# Patient Record
Sex: Female | Born: 1940
Health system: Southern US, Community
[De-identification: ages and names within clinical notes are randomized; demographics above are authoritative.]

## PROBLEM LIST (undated history)

## (undated) DIAGNOSIS — I10 Essential (primary) hypertension: Secondary | ICD-10-CM

## (undated) DIAGNOSIS — E079 Disorder of thyroid, unspecified: Secondary | ICD-10-CM

## (undated) DIAGNOSIS — N3941 Urge incontinence: Secondary | ICD-10-CM

## (undated) DIAGNOSIS — E785 Hyperlipidemia, unspecified: Secondary | ICD-10-CM

## (undated) DIAGNOSIS — M1990 Unspecified osteoarthritis, unspecified site: Secondary | ICD-10-CM

## (undated) DIAGNOSIS — T7840XA Allergy, unspecified, initial encounter: Secondary | ICD-10-CM

## (undated) DIAGNOSIS — J302 Other seasonal allergic rhinitis: Secondary | ICD-10-CM

## (undated) DIAGNOSIS — K219 Gastro-esophageal reflux disease without esophagitis: Secondary | ICD-10-CM

## (undated) HISTORY — PX: FRACTURE SURGERY: SHX138

## (undated) HISTORY — PX: ABDOMINAL HYSTERECTOMY: SHX81

## (undated) HISTORY — DX: Unspecified osteoarthritis, unspecified site: M19.90

## (undated) HISTORY — DX: Hyperlipidemia, unspecified: E78.5

## (undated) HISTORY — PX: CYSTOCELE REPAIR: SHX163

## (undated) HISTORY — PX: ROTATOR CUFF REPAIR: SHX139

## (undated) HISTORY — DX: Allergy, unspecified, initial encounter: T78.40XA

## (undated) HISTORY — PX: APPENDECTOMY: SHX54

---

## 2000-01-14 ENCOUNTER — Other Ambulatory Visit: Admission: RE | Admit: 2000-01-14 | Discharge: 2000-01-14 | Payer: Self-pay | Admitting: Family Medicine

## 2000-08-17 ENCOUNTER — Encounter: Admission: RE | Admit: 2000-08-17 | Discharge: 2000-08-17 | Payer: Self-pay | Admitting: Family Medicine

## 2000-08-17 ENCOUNTER — Encounter: Payer: Self-pay | Admitting: Family Medicine

## 2000-08-28 ENCOUNTER — Ambulatory Visit (HOSPITAL_COMMUNITY): Admission: RE | Admit: 2000-08-28 | Discharge: 2000-08-28 | Payer: Self-pay | Admitting: *Deleted

## 2001-02-15 ENCOUNTER — Other Ambulatory Visit: Admission: RE | Admit: 2001-02-15 | Discharge: 2001-02-15 | Payer: Self-pay | Admitting: *Deleted

## 2002-04-17 ENCOUNTER — Other Ambulatory Visit: Admission: RE | Admit: 2002-04-17 | Discharge: 2002-04-17 | Payer: Self-pay | Admitting: Obstetrics and Gynecology

## 2002-12-07 ENCOUNTER — Encounter: Payer: Self-pay | Admitting: Emergency Medicine

## 2002-12-07 ENCOUNTER — Emergency Department (HOSPITAL_COMMUNITY): Admission: EM | Admit: 2002-12-07 | Discharge: 2002-12-07 | Payer: Self-pay | Admitting: Emergency Medicine

## 2003-04-30 ENCOUNTER — Other Ambulatory Visit: Admission: RE | Admit: 2003-04-30 | Discharge: 2003-04-30 | Payer: Self-pay | Admitting: Gastroenterology

## 2004-02-20 ENCOUNTER — Encounter: Admission: RE | Admit: 2004-02-20 | Discharge: 2004-02-20 | Payer: Self-pay | Admitting: Otolaryngology

## 2004-04-02 ENCOUNTER — Emergency Department (HOSPITAL_COMMUNITY): Admission: EM | Admit: 2004-04-02 | Discharge: 2004-04-02 | Payer: Self-pay | Admitting: Emergency Medicine

## 2004-04-29 ENCOUNTER — Other Ambulatory Visit: Admission: RE | Admit: 2004-04-29 | Discharge: 2004-04-29 | Payer: Self-pay | Admitting: Gastroenterology

## 2005-05-30 ENCOUNTER — Other Ambulatory Visit: Admission: RE | Admit: 2005-05-30 | Discharge: 2005-05-30 | Payer: Self-pay | Admitting: Family Medicine

## 2006-03-13 ENCOUNTER — Ambulatory Visit (HOSPITAL_COMMUNITY): Admission: RE | Admit: 2006-03-13 | Discharge: 2006-03-14 | Payer: Self-pay | Admitting: Obstetrics and Gynecology

## 2006-03-13 ENCOUNTER — Encounter (INDEPENDENT_AMBULATORY_CARE_PROVIDER_SITE_OTHER): Payer: Self-pay | Admitting: *Deleted

## 2008-02-11 ENCOUNTER — Emergency Department (HOSPITAL_BASED_OUTPATIENT_CLINIC_OR_DEPARTMENT_OTHER): Admission: EM | Admit: 2008-02-11 | Discharge: 2008-02-11 | Payer: Self-pay | Admitting: Emergency Medicine

## 2010-11-19 NOTE — Op Note (Signed)
Shelly Pearson, Shelly Pearson              ACCOUNT NO.:  [id number]   MEDICAL RECORD NO.:  [id number]          PATIENT TYPE:  AMB   LOCATION:  SDC                           FACILITY:  [facility]   PHYSICIAN:  Martina Sinner, MD DATE OF BIRTH:  [birth year]   DATE OF PROCEDURE:  03/13/2006  DATE OF DISCHARGE:                                 OPERATIVE REPORT   PREOP DIAGNOSIS:  Cystocele plus mild vaginal vault prolapse.   POSTOPERATIVE DIAGNOSIS:  Cystocele plus mild vaginal vault prolapse.   SURGERY:  Paravaginal cystocele repair utilizing dermal graft plus  cystoscopy (transvaginal hysterectomy performed by Edwena Felty. Romine, MD)   [name] has symptomatic mild grade 3 cystocele with some descensus of  the cervix and uterus.  She consented to transvaginal hysterectomy with  cystocele repair utilizing graft.   The patient was prepped and draped in the usual fashion.  Extra care was  taken to minimize the risk of compartment syndrome, neuropathy, and DVT.  She was given preoperative antibiotics.  Her blood work was normal  preoperatively. I was asked to come in and do the cystocele repair after the  transvaginal hysterectomy.  She underwent a McCall plication of the  ureterosacral ligaments resupporting the cuff.  This helped reduce some of  the cystocele.  She had a fairly short anterior vaginal wall.  The anterior  vaginal wall was opened with a T-shaped anterior incision in the midline.  I  used approximately 20 mL of lidocaine and epinephrine to aid in the  dissection plane and hemostasis.  I dissected the pubocervical fascia back  to the white line bilaterally.  She had a small central defect which was  closed with two running 2-0 Vicryl sutures with an SH needle.  I then  cystoscoped the patient; and she had efflux of indigo carmine from both  ureteral orifice orifices.  There were no distortion of the trigone.   With sharp and blunt dissection, I dissected back to the ischial  spine  bilaterally.  I was not able to fire the __________ Capio device in spite of  several attempts.  Prior to this, I was not that certain that it was  necessary to go back as far as the ischial spine.  After not be able to fire  the Capio device, effectively, I went ahead and did a 4-corners paravaginal  repair using #0 Vicryl suture placed in the pelvic sidewall.  Two sutures  were placed cephalad along the white line of __________, but not reaching  the ischial spine.  Adequate exposure was given with counter traction using  a narrow malleable retractor.  The other two sutures were placed near the  pubocervical ligament.  Good bites were obtained; and I was happy with the  strength of them.   A 10 x 6 cm graft was then sized to approximately 7 x 4 cm.  This was nicely  sutured in place with the help of a Mayo needle.  It was tension free.  One  2-0 Vicryl suture was used to sew the graft in the midline to the cuff.  Approximately 3 mm of vaginal wall was trimmed bilaterally.  I closed the  anterior vaginal wall with running 2-0 Vicryl.  Copious irrigation was  utilized.  Foley catheter was draining well at the of the case.  Leg  position was good.  Hemostasis was excellent.   Hopefully Ms. Resor will achieve an excellent long-term from her  cystocele repair.  She had a very small high rectocele posteriorly; and this  was not addressed at the time of surgery, based upon the preoperative and  intraoperative findings.           ______________________________  Martina Sinner, MD  Electronically Signed     SAM/MEDQ  D:  03/13/2006  T:  03/13/2006  Job:  [id number]

## 2010-11-19 NOTE — Op Note (Signed)
Shelly Pearson, Shelly Pearson              ACCOUNT NO.:  [id number]   MEDICAL RECORD NO.:  [id number]          PATIENT TYPE:  INP   LOCATION:  9320                          FACILITY:  [facility]   PHYSICIAN:  Cynthia P. Romine, M.D.DATE OF BIRTH:  Oct 13, 1940   DATE OF PROCEDURE:  03/13/2006  DATE OF DISCHARGE:                                 OPERATIVE REPORT   PREOPERATIVE DIAGNOSIS:  Uterine descensus with large cystocele.   POSTOPERATIVE DIAGNOSIS:  Uterine descensus with large cystocele, path  pending.   PROCEDURE:  Total vaginal hysterectomy by Dr. Arline Asp Romine, cystocele repair  by Martina Sinner, MD which will be dictated separately.   SURGEON:  Dr. Arline Asp Romine.   ASSISTANT:  Dr. Leda Quail.   ANESTHESIA:  General endotracheal.   ESTIMATED BLOOD LOSS:  50 mL.   COMPLICATIONS:  None.   PROCEDURE:  The patient was taken to the operating room and after induction  of adequate general endotracheal anesthesia was placed in dorsal lithotomy  position and prepped and draped in usual fashion.  The bladder was drained  with a red rubber catheter.  A posterior weighted and anterior Deaver  retractor were placed.  The cervix was grasped on its anterior lip with a  Jacobs tenaculum.  The mucosa over the cervix was infiltrated with  approximately 8 mL of 1% Xylocaine with 1:100,000 epinephrine.  The mucosa  was then incised with a knife from 9 o'clock to 3 o'clock and dissected  anteriorly to reveal the reflection of the peritoneum.  Attention was next  turned posteriorly.  A posterior colpotomy incision was made and the Bonanno  retractor was placed into the posterior cul-de-sac.  Uterosacral ligaments  were clamped, cut and tied with 0 Vicryl.  The cardinal ligaments on each  side were clamped and tied again with 0 Vicryl.  Attention was next turned  anteriorly anterior peritoneum was elevated.  The hemostat and entered  atraumatically and these were placed retractor was placed into  the anterior  peritoneal eight the uterine arteries were then clamped, cut and tied the  hysterectomy proceeded of the broad ligament clamping, cutting and tying in  sequence.  The fundus was delivered through the posterior cul-de-sac and the  pedicle containing the to the utero-ovarian ligament and the round ligament  was clamped, cut and doubly tied on each side.  The first tied with a free  tied the second tied with a fore-and-aft suture.  The pedicles were  inspected and felt to be hemostatic.  There was however, a small amount of  bleeding distal to the pedicle on the broad ligament that was grasped with  an Allis and figure-of-eight suture was used to achieve complete hemostasis.  The on the patient's left the ovaries were inspected and felt to be normal  the vaginal cuff was then run with a suture of 0 Vicryl running and locking,  incorporating the cuff and peritoneum and suture.  An antienterocele stitch  was then placed with 0 Vicryl by going into the posterior peritoneal space  through the cuff grasping the left uterosacral ligaments, skimming across  the  posterior peritoneum, grasping the right uterosacral ligament and coming out  the midline and tying. The wound was irrigated and felt to be hemostatic.  The vaginal cuff was closed with figure-of-eight sutures of 2-0 Vicryl  Rapide.  At this point Dr. Sherron Monday took over to do the cystocele repair  which is dictated in separate report.           ______________________________  Edwena Felty. Romine, M.D.     CPR/MEDQ  D:  03/13/2006  T:  03/14/2006  Job:  [id number]   cc:   Martina Sinner, MD  Fax: [phone]

## 2010-11-19 NOTE — Discharge Summary (Signed)
NAMEBRITTANYA, Shelly Pearson              ACCOUNT NO.:  [id number]   MEDICAL RECORD NO.:  [id number]          PATIENT TYPE:  OIB   LOCATION:  9399                          FACILITY:  [facility]   PHYSICIAN:  Cynthia P. Romine, M.D.DATE OF BIRTH:  Nov 12, 1940   DATE OF ADMISSION:  03/13/2006  DATE OF DISCHARGE:  03/14/2006                                 DISCHARGE SUMMARY   DISCHARGE DIAGNOSIS:  Uterine prolapse with cystocele.   PROCEDURES:  Total vaginal hysterectomy and repair of cystocele.   HISTORY:  This is a 70 year old married, white female, gravida 3, para 2,  who was experiencing vaginal bulging, discomfort with intercourse, and  uterine descensus.  She was admitted for a vaginal hysterectomy and a  cystocele repair.  On March 13, 2006, she underwent a vaginal  hysterectomy and a cystocele repair with repair vault prolapse with vaginal  hysterectomy that was done by Dr. Meredeth Ide.  The paravaginal repair  and the graft were done by Dr. Alfredo Martinez.  Estimated blood loss was  less than 200 mL and there were no complications.  On her first  postoperative day, she was afebrile in good condition.  She was tolerating a  regular diet.  She had voided on her own.  She had scant vaginal bleeding  and she felt she was in condition for a discharge.  She was sent home with  followup to see Dr. Sherron Monday in 2 weeks.  Followup to see me in 4 weeks.  She was given a prescription for Cipro 250 mg p.o. b.i.d. for 7 days and  Percocet 5 mg #12, one p.o. q. 4 h. p.r.n. pain.  She was given full  discharge instructions regarding pelvic  rest, no heavy lifting, and her  followup appointment.   Labs showed, on admission, her H and H were 14 and 41, on discharge 12 and  36.  Her blood type was AB negative.  PT and PTT were normal.  Pathology  report showed an active endometrium and chronic cervicitis.           ______________________________  Edwena Felty. Romine, M.D.     CPR/MEDQ  D:   05/04/2006  T:  05/04/2006  Job:  [id number]

## 2011-05-20 ENCOUNTER — Encounter: Payer: Self-pay | Admitting: *Deleted

## 2011-05-20 ENCOUNTER — Emergency Department (INDEPENDENT_AMBULATORY_CARE_PROVIDER_SITE_OTHER): Payer: Medicare Other

## 2011-05-20 ENCOUNTER — Emergency Department (HOSPITAL_BASED_OUTPATIENT_CLINIC_OR_DEPARTMENT_OTHER)
Admission: EM | Admit: 2011-05-20 | Discharge: 2011-05-20 | Disposition: A | Discharge status: Home / Self Care | Payer: Medicare Other | Attending: Emergency Medicine | Admitting: Emergency Medicine

## 2011-05-20 DIAGNOSIS — M2010 Hallux valgus (acquired), unspecified foot: Secondary | ICD-10-CM

## 2011-05-20 DIAGNOSIS — W19XXXA Unspecified fall, initial encounter: Secondary | ICD-10-CM

## 2011-05-20 DIAGNOSIS — S92109A Unspecified fracture of unspecified talus, initial encounter for closed fracture: Secondary | ICD-10-CM

## 2011-05-20 DIAGNOSIS — S92253A Displaced fracture of navicular [scaphoid] of unspecified foot, initial encounter for closed fracture: Secondary | ICD-10-CM | POA: Insufficient documentation

## 2011-05-20 DIAGNOSIS — X58XXXA Exposure to other specified factors, initial encounter: Secondary | ICD-10-CM | POA: Insufficient documentation

## 2011-05-20 DIAGNOSIS — S92153A Displaced avulsion fracture (chip fracture) of unspecified talus, initial encounter for closed fracture: Secondary | ICD-10-CM

## 2011-05-20 HISTORY — DX: Disorder of thyroid, unspecified: E07.9

## 2011-05-20 HISTORY — DX: Gastro-esophageal reflux disease without esophagitis: K21.9

## 2011-05-20 HISTORY — DX: Essential (primary) hypertension: I10

## 2011-05-20 HISTORY — DX: Other seasonal allergic rhinitis: J30.2

## 2011-05-20 MED ORDER — HYDROCODONE-ACETAMINOPHEN 5-325 MG PO TABS: ORAL_TABLET | ORAL | Status: DC

## 2011-05-20 NOTE — ED Notes (Signed)
Stepped off a step wrong and both feet rolled backward per pt. Swelling and pain to her right ankle and left great toe.

## 2011-05-20 NOTE — ED Provider Notes (Signed)
Medical screening examination/treatment/procedure(s) were performed by non-physician practitioner and as supervising physician I was immediately available for consultation/collaboration.   Dayton Bailiff, MD 05/20/11 [phone]

## 2011-05-20 NOTE — ED Provider Notes (Signed)
History     CSN: [id number] Arrival date & time: 05/20/2011  2:21 PM   First MD Initiated Contact with Patient 05/20/11 1424      Chief Complaint  Patient presents with  . Ankle Injury    (Consider location/radiation/quality/duration/timing/severity/associated sxs/prior treatment) Patient is a 70 y.o. female presenting with lower extremity injury. The history is provided by the patient.  Ankle Injury This is a new problem. The current episode started today (She missed the final stair causing injury to right ankle and left great toe.). The problem occurs constantly. Pertinent negatives include no chills, fever, headaches, nausea or numbness. Associated symptoms comments: No head injury, abdominal or chest pain. She reports injuries isolated to right ankle and left toe.. Exacerbated by: Movement.    Past Medical History  Diagnosis Date  . Hypertension   . Thyroid disease   . GERD (gastroesophageal reflux disease)   . Seasonal allergies     Past Surgical History  Procedure Date  . Cesarean section   . Abdominal hysterectomy     No family history on file.  History  Substance Use Topics  . Smoking status: Never Smoker   . Smokeless tobacco: Not on file  . Alcohol Use: No    OB History    Grav Para Term Preterm Abortions TAB SAB Ect Mult Living                  Review of Systems  Constitutional: Negative for fever and chills.  HENT: Negative.   Respiratory: Negative.   Cardiovascular: Negative.   Gastrointestinal: Negative.  Negative for nausea.  Musculoskeletal:       See HPI.  Skin: Negative.   Neurological: Negative.  Negative for numbness and headaches.    Allergies  Review of patient's allergies indicates no known allergies.  Home Medications   Current Outpatient Rx  Name Route Sig Dispense Refill  . ALLEGRA PO Oral Take by mouth.      Marland Kitchen LEVOTHROID PO Oral Take by mouth.      Marland Kitchen LOSARTAN POTASSIUM-HCTZ PO Oral Take by mouth.      Marland Kitchen RANITIDINE HCL  IJ Injection Inject as directed.      Marland Kitchen SANCTURA PO Oral Take by mouth.        BP 144/68  Pulse 85  Temp(Src) 98.2 F (36.8 C) (Oral)  Resp 22  SpO2 100%  Physical Exam  Constitutional: She is oriented to person, place, and time. She appears well-developed and well-nourished.  Neck: Normal range of motion.  Pulmonary/Chest: Effort normal.  Musculoskeletal:       Right ankle swollen laterally without bony deformity or discoloration. Distal neurosensory exam without deficit. DP pulse intact. Joint stable. Left great toe without swelling, deformity or discoloration. Pain with passive range of motion.  Neurological: She is alert and oriented to person, place, and time.  Skin: Skin is warm and dry.    ED Course  Procedures (including critical care time)  Labs Reviewed - No data to display No results found.   No diagnosis found.    MDM  The patient is put in a splint for the right ankle injury. She ambulates in the ED with crutches without difficulty.        Rodena Medin, PA 05/20/11 [phone]

## 2011-07-08 DIAGNOSIS — S96819A Strain of other specified muscles and tendons at ankle and foot level, unspecified foot, initial encounter: Secondary | ICD-10-CM | POA: Diagnosis not present

## 2011-07-08 DIAGNOSIS — S93499A Sprain of other ligament of unspecified ankle, initial encounter: Secondary | ICD-10-CM | POA: Diagnosis not present

## 2011-07-11 DIAGNOSIS — S93499A Sprain of other ligament of unspecified ankle, initial encounter: Secondary | ICD-10-CM | POA: Diagnosis not present

## 2011-07-11 DIAGNOSIS — S96819A Strain of other specified muscles and tendons at ankle and foot level, unspecified foot, initial encounter: Secondary | ICD-10-CM | POA: Diagnosis not present

## 2011-07-13 DIAGNOSIS — E7800 Pure hypercholesterolemia, unspecified: Secondary | ICD-10-CM | POA: Diagnosis not present

## 2011-07-13 DIAGNOSIS — K219 Gastro-esophageal reflux disease without esophagitis: Secondary | ICD-10-CM | POA: Diagnosis not present

## 2011-07-13 DIAGNOSIS — Z1331 Encounter for screening for depression: Secondary | ICD-10-CM | POA: Diagnosis not present

## 2011-07-13 DIAGNOSIS — E039 Hypothyroidism, unspecified: Secondary | ICD-10-CM | POA: Diagnosis not present

## 2011-07-13 DIAGNOSIS — I10 Essential (primary) hypertension: Secondary | ICD-10-CM | POA: Diagnosis not present

## 2011-07-13 DIAGNOSIS — N3941 Urge incontinence: Secondary | ICD-10-CM | POA: Diagnosis not present

## 2011-07-18 DIAGNOSIS — S93499A Sprain of other ligament of unspecified ankle, initial encounter: Secondary | ICD-10-CM | POA: Diagnosis not present

## 2011-07-18 DIAGNOSIS — S96819A Strain of other specified muscles and tendons at ankle and foot level, unspecified foot, initial encounter: Secondary | ICD-10-CM | POA: Diagnosis not present

## 2011-07-20 DIAGNOSIS — S93499A Sprain of other ligament of unspecified ankle, initial encounter: Secondary | ICD-10-CM | POA: Diagnosis not present

## 2011-07-20 DIAGNOSIS — S96819A Strain of other specified muscles and tendons at ankle and foot level, unspecified foot, initial encounter: Secondary | ICD-10-CM | POA: Diagnosis not present

## 2011-09-07 DIAGNOSIS — M79609 Pain in unspecified limb: Secondary | ICD-10-CM | POA: Diagnosis not present

## 2011-09-07 DIAGNOSIS — H9209 Otalgia, unspecified ear: Secondary | ICD-10-CM | POA: Diagnosis not present

## 2011-09-07 DIAGNOSIS — K219 Gastro-esophageal reflux disease without esophagitis: Secondary | ICD-10-CM | POA: Diagnosis not present

## 2011-09-14 DIAGNOSIS — M7660 Achilles tendinitis, unspecified leg: Secondary | ICD-10-CM | POA: Diagnosis not present

## 2011-09-14 DIAGNOSIS — M722 Plantar fascial fibromatosis: Secondary | ICD-10-CM | POA: Diagnosis not present

## 2011-09-19 DIAGNOSIS — M659 Synovitis and tenosynovitis, unspecified: Secondary | ICD-10-CM | POA: Diagnosis not present

## 2011-09-21 DIAGNOSIS — M659 Synovitis and tenosynovitis, unspecified: Secondary | ICD-10-CM | POA: Diagnosis not present

## 2011-10-10 DIAGNOSIS — M659 Synovitis and tenosynovitis, unspecified: Secondary | ICD-10-CM | POA: Diagnosis not present

## 2011-10-12 DIAGNOSIS — M659 Synovitis and tenosynovitis, unspecified: Secondary | ICD-10-CM | POA: Diagnosis not present

## 2011-10-17 DIAGNOSIS — M659 Synovitis and tenosynovitis, unspecified: Secondary | ICD-10-CM | POA: Diagnosis not present

## 2011-10-19 DIAGNOSIS — M659 Synovitis and tenosynovitis, unspecified: Secondary | ICD-10-CM | POA: Diagnosis not present

## 2011-10-24 DIAGNOSIS — M659 Synovitis and tenosynovitis, unspecified: Secondary | ICD-10-CM | POA: Diagnosis not present

## 2011-10-26 DIAGNOSIS — M659 Synovitis and tenosynovitis, unspecified: Secondary | ICD-10-CM | POA: Diagnosis not present

## 2011-10-31 DIAGNOSIS — M659 Synovitis and tenosynovitis, unspecified: Secondary | ICD-10-CM | POA: Diagnosis not present

## 2011-11-02 DIAGNOSIS — M659 Synovitis and tenosynovitis, unspecified: Secondary | ICD-10-CM | POA: Diagnosis not present

## 2011-11-07 DIAGNOSIS — M659 Synovitis and tenosynovitis, unspecified: Secondary | ICD-10-CM | POA: Diagnosis not present

## 2011-11-09 DIAGNOSIS — M722 Plantar fascial fibromatosis: Secondary | ICD-10-CM | POA: Diagnosis not present

## 2011-11-24 DIAGNOSIS — M722 Plantar fascial fibromatosis: Secondary | ICD-10-CM | POA: Diagnosis not present

## 2011-12-02 DIAGNOSIS — S9030XA Contusion of unspecified foot, initial encounter: Secondary | ICD-10-CM | POA: Diagnosis not present

## 2012-01-12 DIAGNOSIS — M949 Disorder of cartilage, unspecified: Secondary | ICD-10-CM | POA: Diagnosis not present

## 2012-01-12 DIAGNOSIS — Z1231 Encounter for screening mammogram for malignant neoplasm of breast: Secondary | ICD-10-CM | POA: Diagnosis not present

## 2012-01-12 DIAGNOSIS — M899 Disorder of bone, unspecified: Secondary | ICD-10-CM | POA: Diagnosis not present

## 2012-01-12 DIAGNOSIS — Z803 Family history of malignant neoplasm of breast: Secondary | ICD-10-CM | POA: Diagnosis not present

## 2012-02-17 DIAGNOSIS — H9209 Otalgia, unspecified ear: Secondary | ICD-10-CM | POA: Diagnosis not present

## 2012-02-17 DIAGNOSIS — H60399 Other infective otitis externa, unspecified ear: Secondary | ICD-10-CM | POA: Diagnosis not present

## 2012-04-26 DIAGNOSIS — H60399 Other infective otitis externa, unspecified ear: Secondary | ICD-10-CM | POA: Diagnosis not present

## 2012-05-03 DIAGNOSIS — Z23 Encounter for immunization: Secondary | ICD-10-CM | POA: Diagnosis not present

## 2012-05-10 DIAGNOSIS — B368 Other specified superficial mycoses: Secondary | ICD-10-CM | POA: Diagnosis not present

## 2012-05-10 DIAGNOSIS — H60399 Other infective otitis externa, unspecified ear: Secondary | ICD-10-CM | POA: Diagnosis not present

## 2012-05-30 DIAGNOSIS — H60509 Unspecified acute noninfective otitis externa, unspecified ear: Secondary | ICD-10-CM | POA: Diagnosis not present

## 2012-05-30 DIAGNOSIS — B368 Other specified superficial mycoses: Secondary | ICD-10-CM | POA: Diagnosis not present

## 2012-07-25 DIAGNOSIS — E7800 Pure hypercholesterolemia, unspecified: Secondary | ICD-10-CM | POA: Diagnosis not present

## 2012-07-25 DIAGNOSIS — Z0000 Encounter for general adult medical examination without abnormal findings: Secondary | ICD-10-CM | POA: Diagnosis not present

## 2012-07-25 DIAGNOSIS — Z131 Encounter for screening for diabetes mellitus: Secondary | ICD-10-CM | POA: Diagnosis not present

## 2012-07-25 DIAGNOSIS — I10 Essential (primary) hypertension: Secondary | ICD-10-CM | POA: Diagnosis not present

## 2012-08-01 DIAGNOSIS — K219 Gastro-esophageal reflux disease without esophagitis: Secondary | ICD-10-CM | POA: Diagnosis not present

## 2012-10-04 DIAGNOSIS — S40029A Contusion of unspecified upper arm, initial encounter: Secondary | ICD-10-CM | POA: Diagnosis not present

## 2012-10-04 DIAGNOSIS — E039 Hypothyroidism, unspecified: Secondary | ICD-10-CM | POA: Diagnosis not present

## 2012-11-06 ENCOUNTER — Emergency Department (HOSPITAL_BASED_OUTPATIENT_CLINIC_OR_DEPARTMENT_OTHER): Payer: Medicare Other

## 2012-11-06 ENCOUNTER — Emergency Department (HOSPITAL_BASED_OUTPATIENT_CLINIC_OR_DEPARTMENT_OTHER)
Admission: EM | Admit: 2012-11-06 | Discharge: 2012-11-06 | Disposition: A | Discharge status: Home / Self Care | Payer: Medicare Other | Attending: Emergency Medicine | Admitting: Emergency Medicine

## 2012-11-06 ENCOUNTER — Encounter (HOSPITAL_BASED_OUTPATIENT_CLINIC_OR_DEPARTMENT_OTHER): Payer: Self-pay

## 2012-11-06 DIAGNOSIS — K219 Gastro-esophageal reflux disease without esophagitis: Secondary | ICD-10-CM | POA: Insufficient documentation

## 2012-11-06 DIAGNOSIS — Y92009 Unspecified place in unspecified non-institutional (private) residence as the place of occurrence of the external cause: Secondary | ICD-10-CM | POA: Insufficient documentation

## 2012-11-06 DIAGNOSIS — Z79899 Other long term (current) drug therapy: Secondary | ICD-10-CM | POA: Diagnosis not present

## 2012-11-06 DIAGNOSIS — S0990XA Unspecified injury of head, initial encounter: Secondary | ICD-10-CM | POA: Diagnosis not present

## 2012-11-06 DIAGNOSIS — S1093XA Contusion of unspecified part of neck, initial encounter: Secondary | ICD-10-CM | POA: Diagnosis not present

## 2012-11-06 DIAGNOSIS — S0510XA Contusion of eyeball and orbital tissues, unspecified eye, initial encounter: Secondary | ICD-10-CM | POA: Diagnosis not present

## 2012-11-06 DIAGNOSIS — W010XXA Fall on same level from slipping, tripping and stumbling without subsequent striking against object, initial encounter: Secondary | ICD-10-CM | POA: Insufficient documentation

## 2012-11-06 DIAGNOSIS — W19XXXA Unspecified fall, initial encounter: Secondary | ICD-10-CM

## 2012-11-06 DIAGNOSIS — Z8709 Personal history of other diseases of the respiratory system: Secondary | ICD-10-CM | POA: Diagnosis not present

## 2012-11-06 DIAGNOSIS — W1809XA Striking against other object with subsequent fall, initial encounter: Secondary | ICD-10-CM | POA: Insufficient documentation

## 2012-11-06 DIAGNOSIS — I10 Essential (primary) hypertension: Secondary | ICD-10-CM | POA: Insufficient documentation

## 2012-11-06 DIAGNOSIS — S0511XA Contusion of eyeball and orbital tissues, right eye, initial encounter: Secondary | ICD-10-CM

## 2012-11-06 DIAGNOSIS — S0083XA Contusion of other part of head, initial encounter: Secondary | ICD-10-CM | POA: Diagnosis not present

## 2012-11-06 DIAGNOSIS — S199XXA Unspecified injury of neck, initial encounter: Secondary | ICD-10-CM | POA: Diagnosis not present

## 2012-11-06 DIAGNOSIS — E079 Disorder of thyroid, unspecified: Secondary | ICD-10-CM | POA: Insufficient documentation

## 2012-11-06 DIAGNOSIS — Y939 Activity, unspecified: Secondary | ICD-10-CM | POA: Insufficient documentation

## 2012-11-06 HISTORY — DX: Urge incontinence: N39.41

## 2012-11-06 MED ORDER — OXYCODONE-ACETAMINOPHEN 5-325 MG PO TABS: 1.0000 | ORAL_TABLET | ORAL | Status: DC | PRN

## 2012-11-06 MED ORDER — OXYCODONE-ACETAMINOPHEN 5-325 MG PO TABS
1.0000 | ORAL_TABLET | Freq: Once | ORAL | Status: AC
  Administered 2012-11-06: 1 via ORAL
  Filled 2012-11-06 (×2): qty 1

## 2012-11-06 NOTE — ED Notes (Signed)
Pt returned from CT °

## 2012-11-06 NOTE — ED Notes (Signed)
Patient transported to CT 

## 2012-11-06 NOTE — ED Provider Notes (Signed)
History     CSN: [id number]  Arrival date & time 11/06/12  0904   First MD Initiated Contact with Patient 11/06/12 [phone]      Chief Complaint  Patient presents with  . Head Injury    (Consider location/radiation/quality/duration/timing/severity/associated sxs/prior treatment) Patient is a 72 y.o. female presenting with head injury. The history is provided by the patient.  Head Injury She tripped and fell striking her right periorbital area on the floor. She denies loss of consciousness. She denies other injury. Pain is moderate and she rates it 5/10. She denies vision change, nausea, vomiting, dizziness. She is not taking aspirin or other antiplatelet agents and she's not on any anticoagulation.  Past Medical History  Diagnosis Date  . Hypertension   . Thyroid disease   . GERD (gastroesophageal reflux disease)   . Seasonal allergies   . Urgency incontinence     Past Surgical History  Procedure Laterality Date  . Cesarean section    . Abdominal hysterectomy    . Cystocele repair      No family history on file.  History  Substance Use Topics  . Smoking status: Never Smoker   . Smokeless tobacco: Not on file  . Alcohol Use: No    OB History   Grav Para Term Preterm Abortions TAB SAB Ect Mult Living                  Review of Systems  All other systems reviewed and are negative.    Allergies  Review of patient's allergies indicates no known allergies.  Home Medications   Current Outpatient Rx  Name  Route  Sig  Dispense  Refill  . Fexofenadine HCl (ALLEGRA PO)   Oral   Take by mouth.           . Levothyroxine Sodium (LEVOTHROID PO)   Oral   Take by mouth.           Marland Kitchen LOSARTAN POTASSIUM-HCTZ PO   Oral   Take by mouth.           Marland Kitchen RANITIDINE HCL IJ   Injection   Inject as directed.           . Trospium Chloride (SANCTURA PO)   Oral   Take by mouth.             BP 152/69  Pulse 78  Temp(Src) 98 F (36.7 C) (Oral)  Resp 18  Ht  5\' 4"  (1.626 m)  Wt 136 lb (61.689 kg)  BMI 23.33 kg/m2  SpO2 100%  Physical Exam  Nursing note and vitals reviewed.  72 year old female, resting comfortably and in no acute distress. Vital signs are significant for hypertension with blood pressure 152/69. Oxygen saturation is 100%, which is normal. Head is normocephalic. There is periorbital ecchymosis and some swelling around the right eye as well as mild swelling and tenderness in the right malar area. There is no palpable step off of the orbital rim. There is no limitation in extraocular movements. PERRLA, EOMI. Oropharynx is clear. Neck is nontenderwithout adenopathy or JVD. Back is nontender and there is no CVA tenderness. Lungs are clear without rales, wheezes, or rhonchi. Chest is nontender. Heart has regular rate and rhythm without murmur. Abdomen is soft, flat, nontender without masses or hepatosplenomegaly and peristalsis is normoactive. Extremities have no cyanosis or edema, full range of motion is present. Skin is warm and dry without rash. Neurologic: Mental status is normal, cranial nerves are intact,  there are no motor or sensory deficits.  ED Course  Procedures (including critical care time)  Labs Reviewed - No data to display Ct Head Wo Contrast  11/06/2012  *RADIOLOGY REPORT*  Clinical Data: Head injury post fall  CT HEAD WITHOUT CONTRAST,CT MAXILLOFACIAL WITHOUT CONTRAST,CT CERVICAL SPINE WITHOUT CONTRAST  Technique:  Contiguous axial images were obtained from the base of the skull through the vertex without contrast.,Technique: Multidetector CT imaging of the maxillofacial structures was performed. Multiplanar CT image reconstructions were also generated.  Comparison: None.  Findings: No skull fracture is noted.  Paranasal sinuses and mastoid air cells are unremarkable.  No intracranial hemorrhage, mass effect or midline shift.  Mild cerebral atrophy.  No acute infarction.  No mass lesion is noted on this unenhanced scan.   IMPRESSION: No acute intracranial abnormality.  Mild cerebral atrophy.  CT maxillofacial without IV contrast.  Axial images shows no acute fracture or subluxation.  There is soft tissue swelling and subcutaneous stranding right face.  No paranasal sinuses air fluid levels.  No nasal bone fracture.  No intra orbital hematoma.  Mild right preorbital soft tissue swelling.  No zygomatic fracture.  Coronal images shows no orbital rim or orbital floor fracture.  No TMJ dislocation.  No mandibular fracture is identified.  Mild left nasal septum deviation.  Impression: 1.  There is mild soft tissue swelling and subcutaneous stranding right face.  Mild right preorbital soft tissue swelling.  No intraorbital hematoma. 2.  No orbital rim or orbital floor fracture. 3.  No paranasal sinuses air fluid levels.  No mandibular fracture.  CT cervical spine without IV contrast:  Axial images of the cervical spine shows no acute fracture or subluxation.  Mild disc space flattening with mild anterior spurring at C4-C5 level.  Mild disc space flattening with anterior and posterior spurring at C5-C6 level.  No prevertebral soft tissue swelling.  Mild degenerative changes C1-C2 articulation.  Computer processed images shows no acute fracture or subluxation. Cervical airway is patent.  Impression: 1.  No acute fracture or subluxation.  Mild degenerative changes as described above.   Original Report Authenticated By: Natasha Mead, M.D.    Ct Cervical Spine Wo Contrast  11/06/2012  *RADIOLOGY REPORT*  Clinical Data: Head injury post fall  CT HEAD WITHOUT CONTRAST,CT MAXILLOFACIAL WITHOUT CONTRAST,CT CERVICAL SPINE WITHOUT CONTRAST  Technique:  Contiguous axial images were obtained from the base of the skull through the vertex without contrast.,Technique: Multidetector CT imaging of the maxillofacial structures was performed. Multiplanar CT image reconstructions were also generated.  Comparison: None.  Findings: No skull fracture is noted.   Paranasal sinuses and mastoid air cells are unremarkable.  No intracranial hemorrhage, mass effect or midline shift.  Mild cerebral atrophy.  No acute infarction.  No mass lesion is noted on this unenhanced scan.  IMPRESSION: No acute intracranial abnormality.  Mild cerebral atrophy.  CT maxillofacial without IV contrast.  Axial images shows no acute fracture or subluxation.  There is soft tissue swelling and subcutaneous stranding right face.  No paranasal sinuses air fluid levels.  No nasal bone fracture.  No intra orbital hematoma.  Mild right preorbital soft tissue swelling.  No zygomatic fracture.  Coronal images shows no orbital rim or orbital floor fracture.  No TMJ dislocation.  No mandibular fracture is identified.  Mild left nasal septum deviation.  Impression: 1.  There is mild soft tissue swelling and subcutaneous stranding right face.  Mild right preorbital soft tissue swelling.  No intraorbital hematoma. 2.  No orbital rim or orbital floor fracture. 3.  No paranasal sinuses air fluid levels.  No mandibular fracture.  CT cervical spine without IV contrast:  Axial images of the cervical spine shows no acute fracture or subluxation.  Mild disc space flattening with mild anterior spurring at C4-C5 level.  Mild disc space flattening with anterior and posterior spurring at C5-C6 level.  No prevertebral soft tissue swelling.  Mild degenerative changes C1-C2 articulation.  Computer processed images shows no acute fracture or subluxation. Cervical airway is patent.  Impression: 1.  No acute fracture or subluxation.  Mild degenerative changes as described above.   Original Report Authenticated By: Natasha Mead, M.D.    Ct Maxillofacial Wo Cm  11/06/2012  *RADIOLOGY REPORT*  Clinical Data: Head injury post fall  CT HEAD WITHOUT CONTRAST,CT MAXILLOFACIAL WITHOUT CONTRAST,CT CERVICAL SPINE WITHOUT CONTRAST  Technique:  Contiguous axial images were obtained from the base of the skull through the vertex without  contrast.,Technique: Multidetector CT imaging of the maxillofacial structures was performed. Multiplanar CT image reconstructions were also generated.  Comparison: None.  Findings: No skull fracture is noted.  Paranasal sinuses and mastoid air cells are unremarkable.  No intracranial hemorrhage, mass effect or midline shift.  Mild cerebral atrophy.  No acute infarction.  No mass lesion is noted on this unenhanced scan.  IMPRESSION: No acute intracranial abnormality.  Mild cerebral atrophy.  CT maxillofacial without IV contrast.  Axial images shows no acute fracture or subluxation.  There is soft tissue swelling and subcutaneous stranding right face.  No paranasal sinuses air fluid levels.  No nasal bone fracture.  No intra orbital hematoma.  Mild right preorbital soft tissue swelling.  No zygomatic fracture.  Coronal images shows no orbital rim or orbital floor fracture.  No TMJ dislocation.  No mandibular fracture is identified.  Mild left nasal septum deviation.  Impression: 1.  There is mild soft tissue swelling and subcutaneous stranding right face.  Mild right preorbital soft tissue swelling.  No intraorbital hematoma. 2.  No orbital rim or orbital floor fracture. 3.  No paranasal sinuses air fluid levels.  No mandibular fracture.  CT cervical spine without IV contrast:  Axial images of the cervical spine shows no acute fracture or subluxation.  Mild disc space flattening with mild anterior spurring at C4-C5 level.  Mild disc space flattening with anterior and posterior spurring at C5-C6 level.  No prevertebral soft tissue swelling.  Mild degenerative changes C1-C2 articulation.  Computer processed images shows no acute fracture or subluxation. Cervical airway is patent.  Impression: 1.  No acute fracture or subluxation.  Mild degenerative changes as described above.   Original Report Authenticated By: Natasha Mead, M.D.      1. Fall at home, initial encounter   2. Periorbital contusion, right, initial  encounter       MDM  Fall with head injury. She will be sent for CT to rule out fracture and intracranial injury.  CT scans are unremarkable. She states pain has increased and she is requesting something for pain. She's given a dose of Percocet in the ED and sent home with a prescription for Percocet.      Dione Booze, MD 11/06/12 1031

## 2012-11-06 NOTE — ED Notes (Signed)
Pt reports she fell this am striking head on hardwood floor.  Denies LOC. Bruising and pain noted to right eye.

## 2012-11-09 ENCOUNTER — Emergency Department (HOSPITAL_BASED_OUTPATIENT_CLINIC_OR_DEPARTMENT_OTHER): Payer: Medicare Other

## 2012-11-09 ENCOUNTER — Emergency Department (HOSPITAL_BASED_OUTPATIENT_CLINIC_OR_DEPARTMENT_OTHER)
Admission: EM | Admit: 2012-11-09 | Discharge: 2012-11-09 | Disposition: A | Discharge status: Home / Self Care | Payer: Medicare Other | Attending: Emergency Medicine | Admitting: Emergency Medicine

## 2012-11-09 ENCOUNTER — Encounter (HOSPITAL_BASED_OUTPATIENT_CLINIC_OR_DEPARTMENT_OTHER): Payer: Self-pay

## 2012-11-09 DIAGNOSIS — S82899A Other fracture of unspecified lower leg, initial encounter for closed fracture: Secondary | ICD-10-CM | POA: Insufficient documentation

## 2012-11-09 DIAGNOSIS — K219 Gastro-esophageal reflux disease without esophagitis: Secondary | ICD-10-CM | POA: Diagnosis not present

## 2012-11-09 DIAGNOSIS — S92309A Fracture of unspecified metatarsal bone(s), unspecified foot, initial encounter for closed fracture: Secondary | ICD-10-CM | POA: Diagnosis not present

## 2012-11-09 DIAGNOSIS — Z79899 Other long term (current) drug therapy: Secondary | ICD-10-CM | POA: Diagnosis not present

## 2012-11-09 DIAGNOSIS — S92301A Fracture of unspecified metatarsal bone(s), right foot, initial encounter for closed fracture: Secondary | ICD-10-CM

## 2012-11-09 DIAGNOSIS — S8263XA Displaced fracture of lateral malleolus of unspecified fibula, initial encounter for closed fracture: Secondary | ICD-10-CM | POA: Diagnosis not present

## 2012-11-09 DIAGNOSIS — Z87448 Personal history of other diseases of urinary system: Secondary | ICD-10-CM | POA: Insufficient documentation

## 2012-11-09 DIAGNOSIS — Y929 Unspecified place or not applicable: Secondary | ICD-10-CM | POA: Insufficient documentation

## 2012-11-09 DIAGNOSIS — E079 Disorder of thyroid, unspecified: Secondary | ICD-10-CM | POA: Diagnosis not present

## 2012-11-09 DIAGNOSIS — W1789XA Other fall from one level to another, initial encounter: Secondary | ICD-10-CM | POA: Insufficient documentation

## 2012-11-09 DIAGNOSIS — Y9301 Activity, walking, marching and hiking: Secondary | ICD-10-CM | POA: Insufficient documentation

## 2012-11-09 DIAGNOSIS — I10 Essential (primary) hypertension: Secondary | ICD-10-CM | POA: Insufficient documentation

## 2012-11-09 DIAGNOSIS — S82832A Other fracture of upper and lower end of left fibula, initial encounter for closed fracture: Secondary | ICD-10-CM

## 2012-11-09 MED ORDER — ONDANSETRON 4 MG PO TBDP: 4.0000 mg | ORAL_TABLET | Freq: Three times a day (TID) | ORAL | Status: DC | PRN

## 2012-11-09 MED ORDER — OXYCODONE-ACETAMINOPHEN 5-325 MG PO TABS: 2.0000 | ORAL_TABLET | ORAL | Status: DC | PRN

## 2012-11-09 MED ORDER — ONDANSETRON 4 MG PO TBDP
4.0000 mg | ORAL_TABLET | Freq: Once | ORAL | Status: AC
  Administered 2012-11-09: 4 mg via ORAL
  Filled 2012-11-09: qty 1

## 2012-11-09 MED ORDER — OXYCODONE-ACETAMINOPHEN 5-325 MG PO TABS
2.0000 | ORAL_TABLET | Freq: Once | ORAL | Status: AC
  Administered 2012-11-09: 2 via ORAL
  Filled 2012-11-09 (×2): qty 2

## 2012-11-09 NOTE — ED Provider Notes (Signed)
History     CSN: [id number]  Arrival date & time 11/09/12  1248   First MD Initiated Contact with Patient 11/09/12 1314      Chief Complaint  Patient presents with  . Ankle Pain    (Consider location/radiation/quality/duration/timing/severity/associated sxs/prior treatment) Patient is a 72 y.o. female presenting with fall. The history is provided by the patient. No language interpreter was used.  Fall The accident occurred less than 1 hour ago. The fall occurred while walking. She fell from a height of 1 to 2 ft. She landed on a hard floor. There was no blood loss. Point of impact: right ankle and left foot. The pain is at a severity of 5/10. The pain is moderate. She was not ambulatory at the scene. There was no drug use involved in the accident. She has tried nothing for the symptoms. The treatment provided no relief.  Pt tripped on a step,  Pt injured both ankles and foot.   Past Medical History  Diagnosis Date  . Hypertension   . Thyroid disease   . GERD (gastroesophageal reflux disease)   . Seasonal allergies   . Urgency incontinence     Past Surgical History  Procedure Laterality Date  . Cesarean section    . Abdominal hysterectomy    . Cystocele repair      No family history on file.  History  Substance Use Topics  . Smoking status: Never Smoker   . Smokeless tobacco: Not on file  . Alcohol Use: No    OB History   Grav Para Term Preterm Abortions TAB SAB Ect Mult Living                  Review of Systems  Musculoskeletal: Positive for joint swelling.  All other systems reviewed and are negative.    Allergies  Review of patient's allergies indicates no known allergies.  Home Medications   Current Outpatient Rx  Name  Route  Sig  Dispense  Refill  . Fexofenadine HCl (ALLEGRA PO)   Oral   Take by mouth.           . Levothyroxine Sodium (LEVOTHROID PO)   Oral   Take by mouth.           Marland Kitchen LOSARTAN POTASSIUM-HCTZ PO   Oral   Take by mouth.            . oxyCODONE-acetaminophen (ROXICET) 5-325 MG per tablet   Oral   Take 1 tablet by mouth every 4 (four) hours as needed for pain.   20 tablet   0   . RANITIDINE HCL IJ   Injection   Inject as directed.           . Trospium Chloride (SANCTURA PO)   Oral   Take by mouth.             BP 132/70  Pulse 83  Temp(Src) 98.9 F (37.2 C) (Oral)  Resp 16  Ht 5\' 4"  (1.626 m)  Wt 136 lb (61.689 kg)  BMI 23.33 kg/m2  SpO2 98%  Physical Exam  Nursing note and vitals reviewed. Constitutional: She is oriented to person, place, and time. She appears well-developed and well-nourished.  HENT:  Head: Normocephalic and atraumatic.  Eyes: Pupils are equal, round, and reactive to light.  Neck: Normal range of motion.  Musculoskeletal: She exhibits tenderness.  Neurological: She is alert and oriented to person, place, and time. She has normal reflexes.  Skin: Skin is warm.  Psychiatric: She has a normal mood and affect.    ED Course  Procedures (including critical care time)  Labs Reviewed - No data to display Dg Ankle Complete Left  11/09/2012  *RADIOLOGY REPORT*  Clinical Data: Fall today with ankle pain  LEFT ANKLE COMPLETE - 3+ VIEW  Comparison: None.  Findings: There is an oblique fracture through the distal fibula with only minimal displacement identified.  Mild soft tissue swelling is noted.  No other focal abnormality is seen.  IMPRESSION: Distal fibular fracture   Original Report Authenticated By: Alcide Clever, M.D.    Dg Ankle Complete Right  11/09/2012  *RADIOLOGY REPORT*  Clinical Data: Fall, ankle pain  RIGHT ANKLE - COMPLETE 3+ VIEW  Comparison: None.  Findings: Three views of the right ankle submitted.  Small avulsion fracture at the tip of distal fibula.  Soft tissue swelling adjacent to lateral malleolus.  Ankle mortise is preserved. There is nondisplaced fracture at the base of the fifth metatarsal.  IMPRESSION: Small avulsion fracture at the tip of distal  fibula.  Soft tissue swelling adjacent to lateral malleolus.  Ankle mortise is preserved. Nondisplaced fracture at the base of fifth metatarsal.   Original Report Authenticated By: Natasha Mead, M.D.      No diagnosis found.    MDM      Dr. Patria Mane in to see and examine.   Pt advised she needs a wheelchair.  Pt daughter works for a [employer].  Husband can help pt transfer.   Pt advised to see Dr. Pearletha Forge next week for evaluation  Pt given rx for percocet and zofran.   Pt placed in bilat cam walkers.       Lonia Skinner [place], PA-C 11/09/12 1452

## 2012-11-09 NOTE — ED Provider Notes (Signed)
Medical screening examination/treatment/procedure(s) were conducted as a shared visit with non-physician practitioner(s) and myself.  I personally evaluated the patient during the encounter  Orthopedic follow up. non weight bearing bilaterally. Home with wheelchair and bedside commode.   Lyanne Co, MD 11/09/12 [phone]

## 2012-11-09 NOTE — ED Notes (Signed)
MD at bedside. 

## 2012-11-09 NOTE — ED Notes (Signed)
Pt reports she fell after missing a step.  She has bilateral ankle pain and swelling.

## 2012-11-10 ENCOUNTER — Telehealth (HOSPITAL_BASED_OUTPATIENT_CLINIC_OR_DEPARTMENT_OTHER): Payer: Self-pay | Admitting: Physician Assistant

## 2012-11-10 NOTE — ED Notes (Signed)
Pt's daughter called stating that she was concerned that her mother was going to injure her father because she is not allowed to bear weight on either of her lower extremities.  The daughter stated that they had already used [facility] to obtain a wheelchair and bedside commode.  The pt's daughter states that she would like to have someone come out to their home and assess their needs and see what can be done.  Referral to [facility] completed by this RN with order from Langston Masker, PA-C.  Pt's daughter states that she is satisfied with this and will work with [facility] and orthopedist to meet mother's needs.

## 2012-11-11 DIAGNOSIS — I10 Essential (primary) hypertension: Secondary | ICD-10-CM | POA: Diagnosis not present

## 2012-11-11 DIAGNOSIS — IMO0001 Reserved for inherently not codable concepts without codable children: Secondary | ICD-10-CM | POA: Diagnosis not present

## 2012-11-11 DIAGNOSIS — Z9181 History of falling: Secondary | ICD-10-CM | POA: Diagnosis not present

## 2012-11-11 DIAGNOSIS — R269 Unspecified abnormalities of gait and mobility: Secondary | ICD-10-CM | POA: Diagnosis not present

## 2012-11-11 DIAGNOSIS — R32 Unspecified urinary incontinence: Secondary | ICD-10-CM | POA: Diagnosis not present

## 2012-11-12 ENCOUNTER — Ambulatory Visit (INDEPENDENT_AMBULATORY_CARE_PROVIDER_SITE_OTHER): Payer: Medicare Other | Admitting: Family Medicine

## 2012-11-12 ENCOUNTER — Encounter: Payer: Self-pay | Admitting: Family Medicine

## 2012-11-12 VITALS — BP 107/71 | HR 75 | Ht 64.0 in | Wt 135.0 lb

## 2012-11-12 DIAGNOSIS — R32 Unspecified urinary incontinence: Secondary | ICD-10-CM | POA: Diagnosis not present

## 2012-11-12 DIAGNOSIS — S92309A Fracture of unspecified metatarsal bone(s), unspecified foot, initial encounter for closed fracture: Secondary | ICD-10-CM

## 2012-11-12 DIAGNOSIS — Z9181 History of falling: Secondary | ICD-10-CM | POA: Diagnosis not present

## 2012-11-12 DIAGNOSIS — R269 Unspecified abnormalities of gait and mobility: Secondary | ICD-10-CM | POA: Diagnosis not present

## 2012-11-12 DIAGNOSIS — M25579 Pain in unspecified ankle and joints of unspecified foot: Secondary | ICD-10-CM | POA: Diagnosis not present

## 2012-11-12 DIAGNOSIS — S92354A Nondisplaced fracture of fifth metatarsal bone, right foot, initial encounter for closed fracture: Secondary | ICD-10-CM

## 2012-11-12 DIAGNOSIS — S99929A Unspecified injury of unspecified foot, initial encounter: Secondary | ICD-10-CM | POA: Diagnosis not present

## 2012-11-12 DIAGNOSIS — S82402A Unspecified fracture of shaft of left fibula, initial encounter for closed fracture: Secondary | ICD-10-CM

## 2012-11-12 DIAGNOSIS — S8990XA Unspecified injury of unspecified lower leg, initial encounter: Secondary | ICD-10-CM | POA: Diagnosis not present

## 2012-11-12 DIAGNOSIS — M25571 Pain in right ankle and joints of right foot: Secondary | ICD-10-CM

## 2012-11-12 DIAGNOSIS — S82409A Unspecified fracture of shaft of unspecified fibula, initial encounter for closed fracture: Secondary | ICD-10-CM | POA: Diagnosis not present

## 2012-11-12 DIAGNOSIS — I10 Essential (primary) hypertension: Secondary | ICD-10-CM | POA: Diagnosis not present

## 2012-11-12 DIAGNOSIS — IMO0001 Reserved for inherently not codable concepts without codable children: Secondary | ICD-10-CM | POA: Diagnosis not present

## 2012-11-12 DIAGNOSIS — S99911A Unspecified injury of right ankle, initial encounter: Secondary | ICD-10-CM

## 2012-11-12 MED ORDER — OXYCODONE-ACETAMINOPHEN 10-325 MG PO TABS: 1.0000 | ORAL_TABLET | Freq: Four times a day (QID) | ORAL | Status: DC | PRN

## 2012-11-12 NOTE — Patient Instructions (Addendum)
Your primary issues are a distal fibula fracture on the left, right foot avulsion fracture on the right. You should do well with conservative care for both though it's going to likely take longer than usual. Home health assessment is very important and should identify several things to make it easier to get around house - they can fax the report to me at [phone] for me to sign off on it.  If you need anything else (or they do), call me. Ice both ankles/right foot 15 minutes at a time 3-4 times a day. Prop them up above your heart as much as possible. Ok to bear weight as tolerated but must wear cam walkers. Ok to take the cam walkers off if you're lying down, bathing, icing. Take aleve 2 tabs twice a day with food (or ibuprofen 3 tabs three times a day with food) for pain and inflammation. Percocet as needed for severe pain. Follow up with me in 2 weeks.

## 2012-11-13 ENCOUNTER — Encounter: Payer: Self-pay | Admitting: Family Medicine

## 2012-11-13 DIAGNOSIS — S82402A Unspecified fracture of shaft of left fibula, initial encounter for closed fracture: Secondary | ICD-10-CM | POA: Insufficient documentation

## 2012-11-13 DIAGNOSIS — S92354A Nondisplaced fracture of fifth metatarsal bone, right foot, initial encounter for closed fracture: Secondary | ICD-10-CM | POA: Insufficient documentation

## 2012-11-13 DIAGNOSIS — S99911A Unspecified injury of right ankle, initial encounter: Secondary | ICD-10-CM | POA: Insufficient documentation

## 2012-11-13 NOTE — Assessment & Plan Note (Signed)
usually does well with conservative care - cam walker, icing, elevation, aleve, percocet.  Expect 6-8 weeks immobilization to improve.  F/u in 2 weeks.

## 2012-11-13 NOTE — Assessment & Plan Note (Signed)
of all her injuries this is the highest risk of nonunion.  She is non weight bearing currently as she can't put much weight on her feet except a tiny bit to help with transfers.  Cam walker, wheelchair.  Icing, elevation, aleve, percocet for pain.  Will monitor every 2 weeks for healing.  No evidence medial ankle disruption.  Has home health already set up.

## 2012-11-13 NOTE — Progress Notes (Signed)
Subjective:    Patient ID: Shelly Pearson, female    DOB: April 05, 1941, 72 y.o.   MRN: [id number]  PCP: Dr. Foy Guadalajara  HPI 72 yo F here for bilateral foot/ankle injuries.  Patient is here with husband. They report on 5/6 she suffered an initial injury where toes caught in ground in her crocs causing her to fall forward and land on her face. She had a large bruise to right eye - came to ED and had CTs of head, cervical spine, maxillofacial areas without acute injuries, fractures. Then on 5/9 she states she was going out to her flower garden - was up on 1 or 2 steps when she fell forward rolling over and inverting both feet/ankles. She had to crawl into house backwards to get help. Came to ED and had x-rays of both ankles and right foot. Shown to have a right distal fibula tip fracture, base 5th metatarsal avulsion fracture. Also has a left distal fibula spiral fracture that appears to be above the ankle joint. Placed in bilateral cam walkers and referred here for further treatment.  Past Medical History  Diagnosis Date  . Hypertension   . Thyroid disease   . GERD (gastroesophageal reflux disease)   . Seasonal allergies   . Urgency incontinence     Current Outpatient Prescriptions on File Prior to Visit  Medication Sig Dispense Refill  . Levothyroxine Sodium (LEVOTHROID PO) Take by mouth.        Marland Kitchen LOSARTAN POTASSIUM-HCTZ PO Take by mouth.        . Trospium Chloride (SANCTURA PO) Take by mouth.        . Fexofenadine HCl (ALLEGRA PO) Take by mouth.        . ondansetron (ZOFRAN ODT) 4 MG disintegrating tablet Take 1 tablet (4 mg total) by mouth every 8 (eight) hours as needed for nausea.  20 tablet  0  . RANITIDINE HCL IJ Inject as directed.         No current facility-administered medications on file prior to visit.    Past Surgical History  Procedure Laterality Date  . Cesarean section    . Abdominal hysterectomy    . Cystocele repair      No Known Allergies  History    Social History  . Marital Status: Married    Spouse Name: N/A    Number of Children: N/A  . Years of Education: N/A   Occupational History  . Not on file.   Social History Main Topics  . Smoking status: Never Smoker   . Smokeless tobacco: Not on file  . Alcohol Use: No  . Drug Use: No  . Sexually Active: Not on file   Other Topics Concern  . Not on file   Social History Narrative  . No narrative on file    Family History  Problem Relation Age of Onset  . Stroke Mother   . Cancer Father   . Cancer Sister   . Cancer Brother     BP 107/71  Pulse 75  Ht 5\' 4"  (1.626 m)  Wt 135 lb (61.236 kg)  BMI 23.16 kg/m2  Review of Systems See HPI above.    Objective:   Physical Exam Gen: NAD  R foot/ankle: Mod swelling mostly lateral foot, less lateral ankle.  No other deformity.  No warmth, skin breakdown. Mod limitation ROM all directions. TTP greatest base 5th metatarsal.  Mild tenderness lateral malleolus.  No medial ankle, navicular, other foot/ankle tenderness.  No fibular  head tenderness. Positive talar tilt - guarding, painful.  Negative ant drawer. Negative syndesmotic compression. Thompsons test negative. NV intact distally.  L foot/ankle: Mod swelling lateral ankle, bruising.  Mild foot swelling.  No warmth, skin breakdown, other deformities. Very limited ROM. TTP lateral malleolus.  No fibular head, medial malleolus, deltoid ligament, base 5th, navicular, other foot/ankle tenderness. Negative ant drawer.   Did not test syndesmotic compression. Thompsons test negative. NV intact distally.    Assessment & Plan:  1. Left distal fibular fracture - of all her injuries this is the highest risk of nonunion.  She is non weight bearing currently as she can't put much weight on her feet except a tiny bit to help with transfers.  Cam walker, wheelchair.  Icing, elevation, aleve, percocet for pain.  Will monitor every 2 weeks for healing.  No evidence medial ankle  disruption.  Has home health already set up.  2. Right fifth navicular avulsion fracture - usually does well with conservative care - cam walker, icing, elevation, aleve, percocet.  Expect 6-8 weeks immobilization to improve.  F/u in 2 weeks.  3. Right distal fibula avulsion fracture - not much pain here but may be masked by her other injuries.  Typically treat as you would a severe ankle sprain.  She will be more limited going forward by her other fractures.  Will monitor.

## 2012-11-13 NOTE — Assessment & Plan Note (Signed)
not much pain here but may be masked by her other injuries.  Typically treat as you would a severe ankle sprain.  She will be more limited going forward by her other fractures.  Will monitor.

## 2012-11-15 DIAGNOSIS — R32 Unspecified urinary incontinence: Secondary | ICD-10-CM | POA: Diagnosis not present

## 2012-11-15 DIAGNOSIS — IMO0001 Reserved for inherently not codable concepts without codable children: Secondary | ICD-10-CM | POA: Diagnosis not present

## 2012-11-15 DIAGNOSIS — Z9181 History of falling: Secondary | ICD-10-CM | POA: Diagnosis not present

## 2012-11-15 DIAGNOSIS — I10 Essential (primary) hypertension: Secondary | ICD-10-CM | POA: Diagnosis not present

## 2012-11-15 DIAGNOSIS — R269 Unspecified abnormalities of gait and mobility: Secondary | ICD-10-CM | POA: Diagnosis not present

## 2012-11-16 DIAGNOSIS — Z9181 History of falling: Secondary | ICD-10-CM | POA: Diagnosis not present

## 2012-11-16 DIAGNOSIS — R269 Unspecified abnormalities of gait and mobility: Secondary | ICD-10-CM | POA: Diagnosis not present

## 2012-11-16 DIAGNOSIS — R32 Unspecified urinary incontinence: Secondary | ICD-10-CM | POA: Diagnosis not present

## 2012-11-16 DIAGNOSIS — I10 Essential (primary) hypertension: Secondary | ICD-10-CM | POA: Diagnosis not present

## 2012-11-16 DIAGNOSIS — IMO0001 Reserved for inherently not codable concepts without codable children: Secondary | ICD-10-CM | POA: Diagnosis not present

## 2012-11-19 DIAGNOSIS — IMO0001 Reserved for inherently not codable concepts without codable children: Secondary | ICD-10-CM | POA: Diagnosis not present

## 2012-11-19 DIAGNOSIS — Z9181 History of falling: Secondary | ICD-10-CM | POA: Diagnosis not present

## 2012-11-19 DIAGNOSIS — R32 Unspecified urinary incontinence: Secondary | ICD-10-CM | POA: Diagnosis not present

## 2012-11-19 DIAGNOSIS — I10 Essential (primary) hypertension: Secondary | ICD-10-CM | POA: Diagnosis not present

## 2012-11-19 DIAGNOSIS — R269 Unspecified abnormalities of gait and mobility: Secondary | ICD-10-CM | POA: Diagnosis not present

## 2012-11-20 DIAGNOSIS — R32 Unspecified urinary incontinence: Secondary | ICD-10-CM | POA: Diagnosis not present

## 2012-11-20 DIAGNOSIS — I10 Essential (primary) hypertension: Secondary | ICD-10-CM | POA: Diagnosis not present

## 2012-11-20 DIAGNOSIS — IMO0001 Reserved for inherently not codable concepts without codable children: Secondary | ICD-10-CM | POA: Diagnosis not present

## 2012-11-20 DIAGNOSIS — R269 Unspecified abnormalities of gait and mobility: Secondary | ICD-10-CM | POA: Diagnosis not present

## 2012-11-20 DIAGNOSIS — Z9181 History of falling: Secondary | ICD-10-CM | POA: Diagnosis not present

## 2012-11-22 DIAGNOSIS — I10 Essential (primary) hypertension: Secondary | ICD-10-CM | POA: Diagnosis not present

## 2012-11-22 DIAGNOSIS — R269 Unspecified abnormalities of gait and mobility: Secondary | ICD-10-CM | POA: Diagnosis not present

## 2012-11-22 DIAGNOSIS — Z9181 History of falling: Secondary | ICD-10-CM | POA: Diagnosis not present

## 2012-11-22 DIAGNOSIS — IMO0001 Reserved for inherently not codable concepts without codable children: Secondary | ICD-10-CM | POA: Diagnosis not present

## 2012-11-22 DIAGNOSIS — R32 Unspecified urinary incontinence: Secondary | ICD-10-CM | POA: Diagnosis not present

## 2012-11-27 ENCOUNTER — Ambulatory Visit (HOSPITAL_BASED_OUTPATIENT_CLINIC_OR_DEPARTMENT_OTHER): Payer: Medicare Other | Admitting: Family Medicine

## 2012-11-27 ENCOUNTER — Ambulatory Visit (HOSPITAL_BASED_OUTPATIENT_CLINIC_OR_DEPARTMENT_OTHER)
Admission: RE | Admit: 2012-11-27 | Discharge: 2012-11-27 | Disposition: A | Discharge status: Home / Self Care | Payer: Medicare Other | Source: Ambulatory Visit | Attending: Family Medicine | Admitting: Family Medicine

## 2012-11-27 ENCOUNTER — Encounter: Payer: Self-pay | Admitting: Family Medicine

## 2012-11-27 VITALS — BP 112/72 | HR 85 | Ht 64.0 in | Wt 135.0 lb

## 2012-11-27 DIAGNOSIS — M25571 Pain in right ankle and joints of right foot: Secondary | ICD-10-CM

## 2012-11-27 DIAGNOSIS — M25579 Pain in unspecified ankle and joints of unspecified foot: Secondary | ICD-10-CM

## 2012-11-27 DIAGNOSIS — S82402D Unspecified fracture of shaft of left fibula, subsequent encounter for closed fracture with routine healing: Secondary | ICD-10-CM

## 2012-11-27 DIAGNOSIS — S8263XA Displaced fracture of lateral malleolus of unspecified fibula, initial encounter for closed fracture: Secondary | ICD-10-CM | POA: Insufficient documentation

## 2012-11-27 DIAGNOSIS — S8990XA Unspecified injury of unspecified lower leg, initial encounter: Secondary | ICD-10-CM | POA: Diagnosis not present

## 2012-11-27 DIAGNOSIS — S92309A Fracture of unspecified metatarsal bone(s), unspecified foot, initial encounter for closed fracture: Secondary | ICD-10-CM | POA: Diagnosis not present

## 2012-11-27 DIAGNOSIS — Z5189 Encounter for other specified aftercare: Secondary | ICD-10-CM | POA: Diagnosis not present

## 2012-11-27 DIAGNOSIS — S92354A Nondisplaced fracture of fifth metatarsal bone, right foot, initial encounter for closed fracture: Secondary | ICD-10-CM

## 2012-11-27 DIAGNOSIS — S99911D Unspecified injury of right ankle, subsequent encounter: Secondary | ICD-10-CM

## 2012-11-27 DIAGNOSIS — M25572 Pain in left ankle and joints of left foot: Secondary | ICD-10-CM

## 2012-11-27 DIAGNOSIS — S8290XD Unspecified fracture of unspecified lower leg, subsequent encounter for closed fracture with routine healing: Secondary | ICD-10-CM | POA: Diagnosis not present

## 2012-11-27 DIAGNOSIS — S82899A Other fracture of unspecified lower leg, initial encounter for closed fracture: Secondary | ICD-10-CM | POA: Diagnosis not present

## 2012-11-27 DIAGNOSIS — S82409A Unspecified fracture of shaft of unspecified fibula, initial encounter for closed fracture: Secondary | ICD-10-CM | POA: Diagnosis not present

## 2012-11-27 DIAGNOSIS — X58XXXA Exposure to other specified factors, initial encounter: Secondary | ICD-10-CM | POA: Insufficient documentation

## 2012-11-28 ENCOUNTER — Encounter: Payer: Self-pay | Admitting: Family Medicine

## 2012-11-28 NOTE — Assessment & Plan Note (Signed)
Right distal fibula avulsion fracture - More pain here than last visit.  Radiographs do not show a more extensive fracture.  May have some distal syndesmotic/high ankle sprain given swelling she has here now.  Continue with cam walker, icing, elevation, aleve, tylenol.

## 2012-11-28 NOTE — Progress Notes (Signed)
Subjective:    Patient ID: MARYCRUZ BOEHNER, female    DOB: [birth year], 72 y.o.   MRN: [id number]  PCP: Dr. Foy Guadalajara  HPI  72 yo F here for f/u bilateral foot/ankle injuries.  5/12: Patient is here with husband. They report on 5/6 she suffered an initial injury where toes caught in ground in her crocs causing her to fall forward and land on her face. She had a large bruise to right eye - came to ED and had CTs of head, cervical spine, maxillofacial areas without acute injuries, fractures. Then on 5/9 she states she was going out to her flower garden - was up on 1 or 2 steps when she fell forward rolling over and inverting both feet/ankles. She had to crawl into house backwards to get help. Came to ED and had x-rays of both ankles and right foot. Shown to have a right distal fibula tip fracture, base 5th metatarsal avulsion fracture. Also has a left distal fibula spiral fracture that appears to be above the ankle joint. Placed in bilateral cam walkers and referred here for further treatment.  5/27: Patient reports she feels quite a bit better than last visit. She has been icing, elevating both ankles, taking aleve. Has a hospital bed now so easy to prop legs up. Not getting up a lot - able to move some and do transfers easier now. Doing basic strengthening of quads at home. When off her feet she doesn't wear the cam walkers because they seem to rub on outside of her ankles and feel bulky. She has a walker, wheelchair, transfer board, shower chair. Put orthotics in her cam walkers which do help. Has percocet but doesn't like to take this.  Past Medical History  Diagnosis Date  . Hypertension   . Thyroid disease   . GERD (gastroesophageal reflux disease)   . Seasonal allergies   . Urgency incontinence     Current Outpatient Prescriptions on File Prior to Visit  Medication Sig Dispense Refill  . Fexofenadine HCl (ALLEGRA PO) Take by mouth.        . Levothyroxine Sodium  (LEVOTHROID PO) Take by mouth.        Marland Kitchen LOSARTAN POTASSIUM-HCTZ PO Take by mouth.        Marland Kitchen omeprazole (PRILOSEC) 10 MG capsule Take 10 mg by mouth daily.      . ondansetron (ZOFRAN ODT) 4 MG disintegrating tablet Take 1 tablet (4 mg total) by mouth every 8 (eight) hours as needed for nausea.  20 tablet  0  . oxyCODONE-acetaminophen (PERCOCET) 10-325 MG per tablet Take 1 tablet by mouth every 6 (six) hours as needed for pain.  60 tablet  0  . RANITIDINE HCL IJ Inject as directed.        . Trospium Chloride (SANCTURA PO) Take by mouth.         No current facility-administered medications on file prior to visit.    Past Surgical History  Procedure Laterality Date  . Cesarean section    . Abdominal hysterectomy    . Cystocele repair      No Known Allergies  History   Social History  . Marital Status: Married    Spouse Name: N/A    Number of Children: N/A  . Years of Education: N/A   Occupational History  . Not on file.   Social History Main Topics  . Smoking status: Never Smoker   . Smokeless tobacco: Not on file  . Alcohol Use: No  .  Drug Use: No  . Sexually Active: Not on file   Other Topics Concern  . Not on file   Social History Narrative  . No narrative on file    Family History  Problem Relation Age of Onset  . Stroke Mother   . Cancer Father   . Cancer Sister   . Cancer Brother     BP 112/72  Pulse 85  Ht 5\' 4"  (1.626 m)  Wt 135 lb (61.236 kg)  BMI 23.16 kg/m2  Review of Systems  See HPI above.    Objective:   Physical Exam  Gen: NAD  R foot/ankle: Mild swelling mostly lateral foot, less lateral ankle.  Mod swelling anterior ankle over syndesmosis distally.  No other deformity.  No warmth, skin breakdown. Mild limitation ROM all directions. TTP greatest base 5th metatarsal.  Mild tenderness lateral malleolus.  No medial ankle, navicular, other foot/ankle tenderness.  No fibular head tenderness. Deferred talar tilt, ant drawer. Negative  syndesmotic compression. Thompsons test negative. NV intact distally.  L foot/ankle: Mild swelling lateral ankle.  Bruising resolved.  Mild foot swelling.  No warmth, skin breakdown, other deformities. Did not test ROM left side. TTP lateral malleolus and lateral foot.  No fibular head, medial malleolus, deltoid ligament, base 5th, navicular, other foot/ankle tenderness. Did not test syndesmotic compression. Thompsons test negative. NV intact distally.    Assessment & Plan:  1. Left distal fibular fracture - of all her injuries this is the highest risk of nonunion.  Repeat radiographs show no displacement since last visit.  Occasionally puts some weight on here - discussed as little weight as possible but realistically may have to do some to help get into wheelchair - husband helping her a lot.  Would hold PT/OT for now until she's at least 6 weeks out then can consider adding this.  Cam walker, wheelchair, icing, elevation, aleve, tylenol for pain.  F/u in 4 weeks for reevaluation and repeat radiographs.    2. Right fifth metatarsal avulsion fracture - usually does well with conservative care - cam walker, icing, elevation, aleve, tylenol.  Expect 6-8 weeks immobilization to improve.  F/u in 4 weeks.  3. Right distal fibula avulsion fracture - More pain here than last visit.  Radiographs do not show a more extensive fracture.  May have some distal syndesmotic/high ankle sprain given swelling she has here now.  Continue with cam walker, icing, elevation, aleve, tylenol.

## 2012-11-28 NOTE — Assessment & Plan Note (Signed)
Left distal fibular fracture - of all her injuries this is the highest risk of nonunion.  Repeat radiographs show no displacement since last visit.  Occasionally puts some weight on here - discussed as little weight as possible but realistically may have to do some to help get into wheelchair - husband helping her a lot.  Would hold PT/OT for now until she's at least 6 weeks out then can consider adding this.  Cam walker, wheelchair, icing, elevation, aleve, tylenol for pain.  F/u in 4 weeks for reevaluation and repeat radiographs.

## 2012-11-28 NOTE — Assessment & Plan Note (Signed)
usually does well with conservative care - cam walker, icing, elevation, aleve, tylenol.  Expect 6-8 weeks immobilization to improve.  F/u in 4 weeks.

## 2012-11-28 NOTE — Patient Instructions (Addendum)
Follow-up in 4 weeks

## 2012-11-29 DIAGNOSIS — IMO0001 Reserved for inherently not codable concepts without codable children: Secondary | ICD-10-CM | POA: Diagnosis not present

## 2012-11-29 DIAGNOSIS — R269 Unspecified abnormalities of gait and mobility: Secondary | ICD-10-CM | POA: Diagnosis not present

## 2012-11-29 DIAGNOSIS — I10 Essential (primary) hypertension: Secondary | ICD-10-CM | POA: Diagnosis not present

## 2012-11-29 DIAGNOSIS — R32 Unspecified urinary incontinence: Secondary | ICD-10-CM | POA: Diagnosis not present

## 2012-11-29 DIAGNOSIS — Z9181 History of falling: Secondary | ICD-10-CM | POA: Diagnosis not present

## 2012-12-24 ENCOUNTER — Encounter: Payer: Self-pay | Admitting: Family Medicine

## 2012-12-24 ENCOUNTER — Ambulatory Visit (INDEPENDENT_AMBULATORY_CARE_PROVIDER_SITE_OTHER): Payer: Medicare Other | Admitting: Family Medicine

## 2012-12-24 ENCOUNTER — Ambulatory Visit (HOSPITAL_BASED_OUTPATIENT_CLINIC_OR_DEPARTMENT_OTHER)
Admission: RE | Admit: 2012-12-24 | Discharge: 2012-12-24 | Disposition: A | Discharge status: Home / Self Care | Payer: Medicare Other | Source: Ambulatory Visit | Attending: Family Medicine | Admitting: Family Medicine

## 2012-12-24 VITALS — BP 105/66 | HR 80 | Ht 64.0 in | Wt 135.0 lb

## 2012-12-24 DIAGNOSIS — Z5189 Encounter for other specified aftercare: Secondary | ICD-10-CM

## 2012-12-24 DIAGNOSIS — S8990XA Unspecified injury of unspecified lower leg, initial encounter: Secondary | ICD-10-CM | POA: Diagnosis not present

## 2012-12-24 DIAGNOSIS — S82402D Unspecified fracture of shaft of left fibula, subsequent encounter for closed fracture with routine healing: Secondary | ICD-10-CM

## 2012-12-24 DIAGNOSIS — S82409A Unspecified fracture of shaft of unspecified fibula, initial encounter for closed fracture: Secondary | ICD-10-CM | POA: Diagnosis not present

## 2012-12-24 DIAGNOSIS — S8290XD Unspecified fracture of unspecified lower leg, subsequent encounter for closed fracture with routine healing: Secondary | ICD-10-CM

## 2012-12-24 DIAGNOSIS — S99911D Unspecified injury of right ankle, subsequent encounter: Secondary | ICD-10-CM

## 2012-12-24 DIAGNOSIS — S92354A Nondisplaced fracture of fifth metatarsal bone, right foot, initial encounter for closed fracture: Secondary | ICD-10-CM

## 2012-12-24 DIAGNOSIS — M25579 Pain in unspecified ankle and joints of unspecified foot: Secondary | ICD-10-CM

## 2012-12-24 DIAGNOSIS — Z4789 Encounter for other orthopedic aftercare: Secondary | ICD-10-CM | POA: Diagnosis not present

## 2012-12-24 DIAGNOSIS — M25572 Pain in left ankle and joints of left foot: Secondary | ICD-10-CM

## 2012-12-24 DIAGNOSIS — S92309A Fracture of unspecified metatarsal bone(s), unspecified foot, initial encounter for closed fracture: Secondary | ICD-10-CM | POA: Diagnosis not present

## 2012-12-24 DIAGNOSIS — S8263XA Displaced fracture of lateral malleolus of unspecified fibula, initial encounter for closed fracture: Secondary | ICD-10-CM | POA: Diagnosis not present

## 2012-12-24 NOTE — Patient Instructions (Addendum)
Use walking boots when up and walking around for next 2 weeks - ok to put full weight on legs now. Then after this switch to supportive tennis/running shoes. Elevate, ice for 15 minutes at a time as needed for swelling and/or pain. Start physical therapy and do home exercises daily on days you don't go to therapy. Over next 3-4 weeks the only issue you should possibly have is swelling. Follow up with me in 4 weeks or as needed.

## 2012-12-24 NOTE — Assessment & Plan Note (Signed)
Right distal fibula avulsion fracture - Improved compared to last visit.  Start formal PT.  Cam walker as needed for next 2 weeks.

## 2012-12-24 NOTE — Assessment & Plan Note (Signed)
over 6 weeks out from her injuries.  Radiographs show excellent interval healing and her clinical healing also excellent - minimal tenderness on exam.  Start increasing weight bearing and transition out of cam walkers over next 2 weeks.  Start formal PT to regain motion and strength.  Has a walker she will use as well for next 2 weeks.  Icing, elevation, aleve, tylenol as needed.  F/u in 4 weeks or prn.

## 2012-12-24 NOTE — Assessment & Plan Note (Signed)
Right fifth metatarsal avulsion fracture - no pain now - clinically healed.

## 2012-12-24 NOTE — Progress Notes (Signed)
Subjective:    Patient ID: Shelly Pearson, female    DOB: Nov 26, 1940, 72 y.o.   MRN: [id number]  PCP: Dr. Foy Guadalajara  HPI  72 yo F here for f/u bilateral foot/ankle injuries.  5/12: Patient is here with husband. They report on 5/6 she suffered an initial injury where toes caught in ground in her crocs causing her to fall forward and land on her face. She had a large bruise to right eye - came to ED and had CTs of head, cervical spine, maxillofacial areas without acute injuries, fractures. Then on 5/9 she states she was going out to her flower garden - was up on 1 or 2 steps when she fell forward rolling over and inverting both feet/ankles. She had to crawl into house backwards to get help. Came to ED and had x-rays of both ankles and right foot. Shown to have a right distal fibula tip fracture, base 5th metatarsal avulsion fracture. Also has a left distal fibula spiral fracture that appears to be above the ankle joint. Placed in bilateral cam walkers and referred here for further treatment.  5/27: Patient reports she feels quite a bit better than last visit. She has been icing, elevating both ankles, taking aleve. Has a hospital bed now so easy to prop legs up. Not getting up a lot - able to move some and do transfers easier now. Doing basic strengthening of quads at home. When off her feet she doesn't wear the cam walkers because they seem to rub on outside of her ankles and feel bulky. She has a walker, wheelchair, transfer board, shower chair. Put orthotics in her cam walkers which do help. Has percocet but doesn't like to take this.  6/23: Patient reports she is much improved again since last visit. Using cam walkers and over past week has been putting more weight on legs - some soreness, swelling at end of day. Feels like especially on right side boot rubs outside of ankle. No pain when she's weight bearing. No longer with foot pain either. Not taking any medicines for  pain.  Past Medical History  Diagnosis Date  . Hypertension   . Thyroid disease   . GERD (gastroesophageal reflux disease)   . Seasonal allergies   . Urgency incontinence     Current Outpatient Prescriptions on File Prior to Visit  Medication Sig Dispense Refill  . Fexofenadine HCl (ALLEGRA PO) Take by mouth.        . Levothyroxine Sodium (LEVOTHROID PO) Take by mouth.        Marland Kitchen LOSARTAN POTASSIUM-HCTZ PO Take by mouth.        Marland Kitchen omeprazole (PRILOSEC) 10 MG capsule Take 10 mg by mouth daily.      . ondansetron (ZOFRAN ODT) 4 MG disintegrating tablet Take 1 tablet (4 mg total) by mouth every 8 (eight) hours as needed for nausea.  20 tablet  0  . oxyCODONE-acetaminophen (PERCOCET) 10-325 MG per tablet Take 1 tablet by mouth every 6 (six) hours as needed for pain.  60 tablet  0  . RANITIDINE HCL IJ Inject as directed.        . Trospium Chloride (SANCTURA PO) Take by mouth.        Marland Kitchen VAGIFEM 10 MCG TABS        No current facility-administered medications on file prior to visit.    Past Surgical History  Procedure Laterality Date  . Cesarean section    . Abdominal hysterectomy    . Cystocele  repair      No Known Allergies  History   Social History  . Marital Status: Married    Spouse Name: N/A    Number of Children: N/A  . Years of Education: N/A   Occupational History  . Not on file.   Social History Main Topics  . Smoking status: Never Smoker   . Smokeless tobacco: Not on file  . Alcohol Use: No  . Drug Use: No  . Sexually Active: Not on file   Other Topics Concern  . Not on file   Social History Narrative  . No narrative on file    Family History  Problem Relation Age of Onset  . Stroke Mother   . Cancer Father   . Cancer Sister   . Cancer Brother     BP 105/66  Pulse 80  Ht 5\' 4"  (1.626 m)  Wt 135 lb (61.236 kg)  BMI 23.16 kg/m2  Review of Systems  See HPI above.    Objective:   Physical Exam  Gen: NAD  R foot/ankle: Minimal swelling  anterior ankle.  No foot, other swelling, bruising, deformity. Mild limitation ROM all directions. No longer with 5th MT TTP, lat malleolar TTP.  No medial ankle, navicular, other foot/ankle tenderness.  No fibular head tenderness. Negative talar tilt, ant drawer. Mild + syndesmotic compression. Thompsons test negative. NV intact distally.  L foot/ankle: Mild swelling lateral ankle.  No bruising, foot swelling.  No warmth, skin breakdown, other deformities. Mild limitation ROM all directions. Minimal TTP lateral malleolus.  No fibular head, medial malleolus, deltoid ligament, base 5th, navicular, other foot/ankle tenderness. Did not test syndesmotic compression. Thompsons test negative. NV intact distally.    Assessment & Plan:  1. Left distal fibular fracture - over 6 weeks out from her injuries.  Radiographs show excellent interval healing and her clinical healing also excellent - minimal tenderness on exam.  Start increasing weight bearing and transition out of cam walkers over next 2 weeks.  Start formal PT to regain motion and strength.  Has a walker she will use as well for next 2 weeks.  Icing, elevation, aleve, tylenol as needed.  F/u in 4 weeks or prn.  2. Right fifth metatarsal avulsion fracture - no pain now - clinically healed.    3. Right distal fibula avulsion fracture - Improved compared to last visit.  Start formal PT.  Cam walker as needed for next 2 weeks.

## 2013-01-09 ENCOUNTER — Ambulatory Visit: Payer: Medicare Other | Attending: Family Medicine | Admitting: Physical Therapy

## 2013-01-09 DIAGNOSIS — M25579 Pain in unspecified ankle and joints of unspecified foot: Secondary | ICD-10-CM | POA: Insufficient documentation

## 2013-01-09 DIAGNOSIS — M2560 Stiffness of unspecified joint, not elsewhere classified: Secondary | ICD-10-CM | POA: Diagnosis not present

## 2013-01-09 DIAGNOSIS — R5381 Other malaise: Secondary | ICD-10-CM | POA: Insufficient documentation

## 2013-01-09 DIAGNOSIS — R269 Unspecified abnormalities of gait and mobility: Secondary | ICD-10-CM | POA: Diagnosis not present

## 2013-01-09 DIAGNOSIS — IMO0001 Reserved for inherently not codable concepts without codable children: Secondary | ICD-10-CM | POA: Insufficient documentation

## 2013-01-15 ENCOUNTER — Ambulatory Visit: Payer: Medicare Other | Admitting: Physical Therapy

## 2013-01-18 ENCOUNTER — Ambulatory Visit: Payer: Medicare Other | Admitting: Physical Therapy

## 2013-01-22 ENCOUNTER — Ambulatory Visit: Payer: Medicare Other | Admitting: Physical Therapy

## 2013-01-25 ENCOUNTER — Ambulatory Visit: Payer: Medicare Other | Admitting: Physical Therapy

## 2013-01-29 ENCOUNTER — Ambulatory Visit: Payer: Medicare Other | Admitting: Physical Therapy

## 2013-01-31 ENCOUNTER — Ambulatory Visit: Payer: Medicare Other | Admitting: Physical Therapy

## 2013-02-01 ENCOUNTER — Encounter: Payer: Medicare Other | Admitting: Physical Therapy

## 2013-02-05 ENCOUNTER — Ambulatory Visit: Payer: Medicare Other | Attending: Family Medicine | Admitting: Physical Therapy

## 2013-02-05 DIAGNOSIS — R269 Unspecified abnormalities of gait and mobility: Secondary | ICD-10-CM | POA: Insufficient documentation

## 2013-02-05 DIAGNOSIS — M25579 Pain in unspecified ankle and joints of unspecified foot: Secondary | ICD-10-CM | POA: Insufficient documentation

## 2013-02-05 DIAGNOSIS — M2560 Stiffness of unspecified joint, not elsewhere classified: Secondary | ICD-10-CM | POA: Diagnosis not present

## 2013-02-05 DIAGNOSIS — R5381 Other malaise: Secondary | ICD-10-CM | POA: Diagnosis not present

## 2013-02-05 DIAGNOSIS — IMO0001 Reserved for inherently not codable concepts without codable children: Secondary | ICD-10-CM | POA: Insufficient documentation

## 2013-02-08 ENCOUNTER — Ambulatory Visit: Payer: Medicare Other | Admitting: Physical Therapy

## 2013-02-08 DIAGNOSIS — M25579 Pain in unspecified ankle and joints of unspecified foot: Secondary | ICD-10-CM | POA: Diagnosis not present

## 2013-02-08 DIAGNOSIS — R269 Unspecified abnormalities of gait and mobility: Secondary | ICD-10-CM | POA: Diagnosis not present

## 2013-02-08 DIAGNOSIS — R5381 Other malaise: Secondary | ICD-10-CM | POA: Diagnosis not present

## 2013-02-08 DIAGNOSIS — M2560 Stiffness of unspecified joint, not elsewhere classified: Secondary | ICD-10-CM | POA: Diagnosis not present

## 2013-02-08 DIAGNOSIS — IMO0001 Reserved for inherently not codable concepts without codable children: Secondary | ICD-10-CM | POA: Diagnosis not present

## 2013-02-11 ENCOUNTER — Ambulatory Visit: Payer: Medicare Other | Admitting: Physical Therapy

## 2013-02-11 DIAGNOSIS — IMO0001 Reserved for inherently not codable concepts without codable children: Secondary | ICD-10-CM | POA: Diagnosis not present

## 2013-02-11 DIAGNOSIS — M2560 Stiffness of unspecified joint, not elsewhere classified: Secondary | ICD-10-CM | POA: Diagnosis not present

## 2013-02-11 DIAGNOSIS — R269 Unspecified abnormalities of gait and mobility: Secondary | ICD-10-CM | POA: Diagnosis not present

## 2013-02-11 DIAGNOSIS — M25579 Pain in unspecified ankle and joints of unspecified foot: Secondary | ICD-10-CM | POA: Diagnosis not present

## 2013-02-11 DIAGNOSIS — R5381 Other malaise: Secondary | ICD-10-CM | POA: Diagnosis not present

## 2013-02-13 DIAGNOSIS — Z1231 Encounter for screening mammogram for malignant neoplasm of breast: Secondary | ICD-10-CM | POA: Diagnosis not present

## 2013-02-14 ENCOUNTER — Ambulatory Visit: Payer: Medicare Other | Admitting: Physical Therapy

## 2013-02-14 DIAGNOSIS — R269 Unspecified abnormalities of gait and mobility: Secondary | ICD-10-CM | POA: Diagnosis not present

## 2013-02-14 DIAGNOSIS — R5381 Other malaise: Secondary | ICD-10-CM | POA: Diagnosis not present

## 2013-02-14 DIAGNOSIS — IMO0001 Reserved for inherently not codable concepts without codable children: Secondary | ICD-10-CM | POA: Diagnosis not present

## 2013-02-14 DIAGNOSIS — M2560 Stiffness of unspecified joint, not elsewhere classified: Secondary | ICD-10-CM | POA: Diagnosis not present

## 2013-02-14 DIAGNOSIS — M25579 Pain in unspecified ankle and joints of unspecified foot: Secondary | ICD-10-CM | POA: Diagnosis not present

## 2013-06-11 DIAGNOSIS — M899 Disorder of bone, unspecified: Secondary | ICD-10-CM | POA: Diagnosis not present

## 2013-06-11 DIAGNOSIS — R5381 Other malaise: Secondary | ICD-10-CM | POA: Diagnosis not present

## 2013-06-11 DIAGNOSIS — E039 Hypothyroidism, unspecified: Secondary | ICD-10-CM | POA: Diagnosis not present

## 2013-06-11 DIAGNOSIS — R51 Headache: Secondary | ICD-10-CM | POA: Diagnosis not present

## 2013-06-11 DIAGNOSIS — D509 Iron deficiency anemia, unspecified: Secondary | ICD-10-CM | POA: Diagnosis not present

## 2013-06-11 DIAGNOSIS — G4700 Insomnia, unspecified: Secondary | ICD-10-CM | POA: Diagnosis not present

## 2013-06-11 DIAGNOSIS — R0609 Other forms of dyspnea: Secondary | ICD-10-CM | POA: Diagnosis not present

## 2013-06-24 DIAGNOSIS — Z1211 Encounter for screening for malignant neoplasm of colon: Secondary | ICD-10-CM | POA: Diagnosis not present

## 2013-08-15 DIAGNOSIS — I10 Essential (primary) hypertension: Secondary | ICD-10-CM | POA: Diagnosis not present

## 2013-08-15 DIAGNOSIS — Z0000 Encounter for general adult medical examination without abnormal findings: Secondary | ICD-10-CM | POA: Diagnosis not present

## 2013-08-15 DIAGNOSIS — E039 Hypothyroidism, unspecified: Secondary | ICD-10-CM | POA: Diagnosis not present

## 2013-08-15 DIAGNOSIS — Z23 Encounter for immunization: Secondary | ICD-10-CM | POA: Diagnosis not present

## 2013-08-15 DIAGNOSIS — D509 Iron deficiency anemia, unspecified: Secondary | ICD-10-CM | POA: Diagnosis not present

## 2013-08-15 DIAGNOSIS — Z131 Encounter for screening for diabetes mellitus: Secondary | ICD-10-CM | POA: Diagnosis not present

## 2013-08-15 DIAGNOSIS — E7800 Pure hypercholesterolemia, unspecified: Secondary | ICD-10-CM | POA: Diagnosis not present

## 2013-09-05 DIAGNOSIS — E7800 Pure hypercholesterolemia, unspecified: Secondary | ICD-10-CM | POA: Diagnosis not present

## 2013-09-05 DIAGNOSIS — D509 Iron deficiency anemia, unspecified: Secondary | ICD-10-CM | POA: Diagnosis not present

## 2013-09-05 DIAGNOSIS — I10 Essential (primary) hypertension: Secondary | ICD-10-CM | POA: Diagnosis not present

## 2013-09-24 ENCOUNTER — Encounter (INDEPENDENT_AMBULATORY_CARE_PROVIDER_SITE_OTHER): Payer: Self-pay

## 2013-09-24 ENCOUNTER — Encounter: Payer: Self-pay | Admitting: Family Medicine

## 2013-09-24 ENCOUNTER — Ambulatory Visit (INDEPENDENT_AMBULATORY_CARE_PROVIDER_SITE_OTHER): Payer: Medicare Other | Admitting: Family Medicine

## 2013-09-24 VITALS — BP 117/75 | HR 84 | Ht 64.0 in | Wt 135.0 lb

## 2013-09-24 DIAGNOSIS — M25579 Pain in unspecified ankle and joints of unspecified foot: Secondary | ICD-10-CM | POA: Diagnosis not present

## 2013-09-24 DIAGNOSIS — M25572 Pain in left ankle and joints of left foot: Secondary | ICD-10-CM

## 2013-09-24 NOTE — Patient Instructions (Signed)
You have posterior tibialis tendinopathy. Continue with good arch supports regularly - no barefoot walking. Do home exercises 3 sets of 10 once a day (internal rotation of ankle with theraband, single leg calf raise). Icing 15 minutes at a time 3-4 times a day. Try topical flexall, aspercreme, or capsaicin four times a day. Consider physical therapy if still struggling after 5-6 weeks. Otherwise follow-up with me as needed.

## 2013-09-26 ENCOUNTER — Encounter: Payer: Self-pay | Admitting: Family Medicine

## 2013-09-26 DIAGNOSIS — M25572 Pain in left ankle and joints of left foot: Secondary | ICD-10-CM | POA: Insufficient documentation

## 2013-09-26 NOTE — Progress Notes (Signed)
Patient ID: Shelly Pearson, female   DOB: February 16, 1941, 74 y.o.   MRN: [id number]  PCP: Abigail Miyamoto, MD  Subjective:   HPI: Patient is a 73 y.o. female here for left foot/ankle pain.  Patient reports she completely recovered from prior fractures (see last note). Over past 4 weeks she's had worsening medial left ankle pain. Is doing some home exercises for feet. Pain radiates up medial calf. No swelling. Worse by end of day. Has not tried any medications for this.  Past Medical History  Diagnosis Date  . Hypertension   . Thyroid disease   . GERD (gastroesophageal reflux disease)   . Seasonal allergies   . Urgency incontinence     Current Outpatient Prescriptions on File Prior to Visit  Medication Sig Dispense Refill  . Fexofenadine HCl (ALLEGRA PO) Take by mouth.        . Levothyroxine Sodium (LEVOTHROID PO) Take by mouth.        Marland Kitchen LOSARTAN POTASSIUM-HCTZ PO Take by mouth.        Marland Kitchen omeprazole (PRILOSEC) 10 MG capsule Take 10 mg by mouth daily.      . ondansetron (ZOFRAN ODT) 4 MG disintegrating tablet Take 1 tablet (4 mg total) by mouth every 8 (eight) hours as needed for nausea.  20 tablet  0  . oxyCODONE-acetaminophen (PERCOCET) 10-325 MG per tablet Take 1 tablet by mouth every 6 (six) hours as needed for pain.  60 tablet  0  . RANITIDINE HCL IJ Inject as directed.        . Trospium Chloride (SANCTURA PO) Take by mouth.        Marland Kitchen VAGIFEM 10 MCG TABS        No current facility-administered medications on file prior to visit.    Past Surgical History  Procedure Laterality Date  . Cesarean section    . Abdominal hysterectomy    . Cystocele repair      No Known Allergies  History   Social History  . Marital Status: Married    Spouse Name: N/A    Number of Children: N/A  . Years of Education: N/A   Occupational History  . Not on file.   Social History Main Topics  . Smoking status: Never Smoker   . Smokeless tobacco: Not on file  . Alcohol Use: No  .  Drug Use: No  . Sexual Activity: Not on file   Other Topics Concern  . Not on file   Social History Narrative  . No narrative on file    Family History  Problem Relation Age of Onset  . Stroke Mother   . Cancer Father   . Cancer Sister   . Cancer Brother     BP 117/75  Pulse 84  Ht 5\' 4"  (1.626 m)  Wt 135 lb (61.236 kg)  BMI 23.16 kg/m2  Review of Systems: See HPI above.    Objective:  Physical Exam:  Gen: NAD  Left foot/ankle: Mild medial ankle swelling.  Overpronation, pes planus.  No other deformity, bruising. FROM with pain on IR and ER. TTP along course of post tib as it crosses ankle joint medially. Negative ant drawer and talar tilt.   Negative syndesmotic compression. Thompsons test negative. NV intact distally.    Assessment & Plan:  1. Left ankle pain - 2/2 posterior tibialis tendinopathy.  Continue with good arch supports and no barefoot walking.  HEP reviewed today.  Icing, topical medications.  Consider formal physical therapy if  not improving over next 5-6 weeks.

## 2013-09-26 NOTE — Assessment & Plan Note (Signed)
2/2 posterior tibialis tendinopathy.  Continue with good arch supports and no barefoot walking.  HEP reviewed today.  Icing, topical medications.  Consider formal physical therapy if not improving over next 5-6 weeks.

## 2013-10-01 DIAGNOSIS — H2510 Age-related nuclear cataract, unspecified eye: Secondary | ICD-10-CM | POA: Diagnosis not present

## 2013-10-11 DIAGNOSIS — L820 Inflamed seborrheic keratosis: Secondary | ICD-10-CM | POA: Diagnosis not present

## 2013-10-11 DIAGNOSIS — L570 Actinic keratosis: Secondary | ICD-10-CM | POA: Diagnosis not present

## 2013-10-11 DIAGNOSIS — L821 Other seborrheic keratosis: Secondary | ICD-10-CM | POA: Diagnosis not present

## 2013-10-21 DIAGNOSIS — L2089 Other atopic dermatitis: Secondary | ICD-10-CM | POA: Diagnosis not present

## 2013-10-21 DIAGNOSIS — D2330 Other benign neoplasm of skin of unspecified part of face: Secondary | ICD-10-CM | POA: Diagnosis not present

## 2013-10-21 DIAGNOSIS — L820 Inflamed seborrheic keratosis: Secondary | ICD-10-CM | POA: Diagnosis not present

## 2013-10-21 DIAGNOSIS — L570 Actinic keratosis: Secondary | ICD-10-CM | POA: Diagnosis not present

## 2013-10-30 DIAGNOSIS — H60509 Unspecified acute noninfective otitis externa, unspecified ear: Secondary | ICD-10-CM | POA: Diagnosis not present

## 2013-11-08 DIAGNOSIS — S61409A Unspecified open wound of unspecified hand, initial encounter: Secondary | ICD-10-CM | POA: Diagnosis not present

## 2013-11-08 DIAGNOSIS — Z23 Encounter for immunization: Secondary | ICD-10-CM | POA: Diagnosis not present

## 2014-02-14 DIAGNOSIS — Z1231 Encounter for screening mammogram for malignant neoplasm of breast: Secondary | ICD-10-CM | POA: Diagnosis not present

## 2014-02-14 DIAGNOSIS — Z803 Family history of malignant neoplasm of breast: Secondary | ICD-10-CM | POA: Diagnosis not present

## 2014-02-14 DIAGNOSIS — Z780 Asymptomatic menopausal state: Secondary | ICD-10-CM | POA: Diagnosis not present

## 2014-03-11 DIAGNOSIS — L608 Other nail disorders: Secondary | ICD-10-CM | POA: Diagnosis not present

## 2014-03-11 DIAGNOSIS — Z23 Encounter for immunization: Secondary | ICD-10-CM | POA: Diagnosis not present

## 2014-03-11 DIAGNOSIS — R42 Dizziness and giddiness: Secondary | ICD-10-CM | POA: Diagnosis not present

## 2014-03-11 DIAGNOSIS — I10 Essential (primary) hypertension: Secondary | ICD-10-CM | POA: Diagnosis not present

## 2014-06-15 DIAGNOSIS — J209 Acute bronchitis, unspecified: Secondary | ICD-10-CM | POA: Diagnosis not present

## 2014-06-15 DIAGNOSIS — R05 Cough: Secondary | ICD-10-CM | POA: Diagnosis not present

## 2014-08-25 DIAGNOSIS — L570 Actinic keratosis: Secondary | ICD-10-CM | POA: Diagnosis not present

## 2014-08-25 DIAGNOSIS — Z1283 Encounter for screening for malignant neoplasm of skin: Secondary | ICD-10-CM | POA: Diagnosis not present

## 2014-08-25 DIAGNOSIS — L820 Inflamed seborrheic keratosis: Secondary | ICD-10-CM | POA: Diagnosis not present

## 2014-08-25 DIAGNOSIS — Z809 Family history of malignant neoplasm, unspecified: Secondary | ICD-10-CM | POA: Diagnosis not present

## 2014-08-25 DIAGNOSIS — L821 Other seborrheic keratosis: Secondary | ICD-10-CM | POA: Diagnosis not present

## 2014-08-27 DIAGNOSIS — K219 Gastro-esophageal reflux disease without esophagitis: Secondary | ICD-10-CM | POA: Diagnosis not present

## 2014-08-27 DIAGNOSIS — J387 Other diseases of larynx: Secondary | ICD-10-CM | POA: Diagnosis not present

## 2014-08-27 DIAGNOSIS — N951 Menopausal and female climacteric states: Secondary | ICD-10-CM | POA: Diagnosis not present

## 2014-09-11 DIAGNOSIS — K219 Gastro-esophageal reflux disease without esophagitis: Secondary | ICD-10-CM | POA: Diagnosis not present

## 2014-09-11 DIAGNOSIS — E780 Pure hypercholesterolemia: Secondary | ICD-10-CM | POA: Diagnosis not present

## 2014-09-11 DIAGNOSIS — D509 Iron deficiency anemia, unspecified: Secondary | ICD-10-CM | POA: Diagnosis not present

## 2014-09-11 DIAGNOSIS — Z0000 Encounter for general adult medical examination without abnormal findings: Secondary | ICD-10-CM | POA: Diagnosis not present

## 2014-09-11 DIAGNOSIS — E039 Hypothyroidism, unspecified: Secondary | ICD-10-CM | POA: Diagnosis not present

## 2014-09-11 DIAGNOSIS — I10 Essential (primary) hypertension: Secondary | ICD-10-CM | POA: Diagnosis not present

## 2014-09-11 DIAGNOSIS — M5412 Radiculopathy, cervical region: Secondary | ICD-10-CM | POA: Diagnosis not present

## 2014-09-12 ENCOUNTER — Other Ambulatory Visit (HOSPITAL_BASED_OUTPATIENT_CLINIC_OR_DEPARTMENT_OTHER): Payer: Self-pay | Admitting: Family Medicine

## 2014-09-12 DIAGNOSIS — M5412 Radiculopathy, cervical region: Secondary | ICD-10-CM

## 2014-09-13 ENCOUNTER — Other Ambulatory Visit (HOSPITAL_BASED_OUTPATIENT_CLINIC_OR_DEPARTMENT_OTHER): Payer: Self-pay | Admitting: Family Medicine

## 2014-09-13 ENCOUNTER — Ambulatory Visit (HOSPITAL_BASED_OUTPATIENT_CLINIC_OR_DEPARTMENT_OTHER)
Admission: RE | Admit: 2014-09-13 | Discharge: 2014-09-13 | Disposition: A | Discharge status: Home / Self Care | Payer: Medicare Other | Source: Ambulatory Visit | Attending: Family Medicine | Admitting: Family Medicine

## 2014-09-13 DIAGNOSIS — M5412 Radiculopathy, cervical region: Secondary | ICD-10-CM | POA: Insufficient documentation

## 2014-09-13 DIAGNOSIS — M5012 Cervical disc disorder with radiculopathy, mid-cervical region: Secondary | ICD-10-CM | POA: Diagnosis not present

## 2014-09-13 DIAGNOSIS — M5021 Other cervical disc displacement,  high cervical region: Secondary | ICD-10-CM | POA: Insufficient documentation

## 2014-09-13 DIAGNOSIS — M4802 Spinal stenosis, cervical region: Secondary | ICD-10-CM | POA: Insufficient documentation

## 2014-09-13 DIAGNOSIS — M5022 Other cervical disc displacement, mid-cervical region: Secondary | ICD-10-CM | POA: Diagnosis not present

## 2014-09-13 DIAGNOSIS — M2578 Osteophyte, vertebrae: Secondary | ICD-10-CM | POA: Diagnosis not present

## 2014-10-06 DIAGNOSIS — M2041 Other hammer toe(s) (acquired), right foot: Secondary | ICD-10-CM | POA: Diagnosis not present

## 2014-10-06 DIAGNOSIS — M2042 Other hammer toe(s) (acquired), left foot: Secondary | ICD-10-CM | POA: Diagnosis not present

## 2014-10-06 DIAGNOSIS — M2012 Hallux valgus (acquired), left foot: Secondary | ICD-10-CM | POA: Diagnosis not present

## 2014-10-28 DIAGNOSIS — H16223 Keratoconjunctivitis sicca, not specified as Sjogren's, bilateral: Secondary | ICD-10-CM | POA: Diagnosis not present

## 2014-10-28 DIAGNOSIS — H2513 Age-related nuclear cataract, bilateral: Secondary | ICD-10-CM | POA: Diagnosis not present

## 2014-11-20 DIAGNOSIS — M5412 Radiculopathy, cervical region: Secondary | ICD-10-CM | POA: Diagnosis not present

## 2014-11-20 DIAGNOSIS — M9981 Other biomechanical lesions of cervical region: Secondary | ICD-10-CM | POA: Diagnosis not present

## 2014-11-20 DIAGNOSIS — M6281 Muscle weakness (generalized): Secondary | ICD-10-CM | POA: Diagnosis not present

## 2014-12-09 DIAGNOSIS — M5412 Radiculopathy, cervical region: Secondary | ICD-10-CM | POA: Diagnosis not present

## 2014-12-09 DIAGNOSIS — M9981 Other biomechanical lesions of cervical region: Secondary | ICD-10-CM | POA: Diagnosis not present

## 2014-12-11 DIAGNOSIS — E039 Hypothyroidism, unspecified: Secondary | ICD-10-CM | POA: Diagnosis not present

## 2015-04-08 DIAGNOSIS — Z23 Encounter for immunization: Secondary | ICD-10-CM | POA: Diagnosis not present

## 2015-04-24 DIAGNOSIS — I10 Essential (primary) hypertension: Secondary | ICD-10-CM | POA: Diagnosis not present

## 2015-04-24 DIAGNOSIS — Z792 Long term (current) use of antibiotics: Secondary | ICD-10-CM | POA: Diagnosis not present

## 2015-04-24 DIAGNOSIS — W231XXA Caught, crushed, jammed, or pinched between stationary objects, initial encounter: Secondary | ICD-10-CM | POA: Diagnosis not present

## 2015-04-24 DIAGNOSIS — Y998 Other external cause status: Secondary | ICD-10-CM | POA: Diagnosis not present

## 2015-04-24 DIAGNOSIS — S91012A Laceration without foreign body, left ankle, initial encounter: Secondary | ICD-10-CM | POA: Diagnosis not present

## 2015-04-24 DIAGNOSIS — Z79891 Long term (current) use of opiate analgesic: Secondary | ICD-10-CM | POA: Diagnosis not present

## 2015-04-24 DIAGNOSIS — Z79899 Other long term (current) drug therapy: Secondary | ICD-10-CM | POA: Diagnosis not present

## 2015-04-24 DIAGNOSIS — S81822A Laceration with foreign body, left lower leg, initial encounter: Secondary | ICD-10-CM | POA: Diagnosis not present

## 2015-04-24 DIAGNOSIS — Y9389 Activity, other specified: Secondary | ICD-10-CM | POA: Diagnosis not present

## 2015-05-06 DIAGNOSIS — S91012A Laceration without foreign body, left ankle, initial encounter: Secondary | ICD-10-CM | POA: Diagnosis not present

## 2015-05-06 DIAGNOSIS — Z4802 Encounter for removal of sutures: Secondary | ICD-10-CM | POA: Diagnosis not present

## 2015-05-19 DIAGNOSIS — T148 Other injury of unspecified body region: Secondary | ICD-10-CM | POA: Diagnosis not present

## 2015-06-02 DIAGNOSIS — Z803 Family history of malignant neoplasm of breast: Secondary | ICD-10-CM | POA: Diagnosis not present

## 2015-06-02 DIAGNOSIS — Z1231 Encounter for screening mammogram for malignant neoplasm of breast: Secondary | ICD-10-CM | POA: Diagnosis not present

## 2015-06-17 DIAGNOSIS — T148 Other injury of unspecified body region: Secondary | ICD-10-CM | POA: Diagnosis not present

## 2015-06-23 ENCOUNTER — Ambulatory Visit (INDEPENDENT_AMBULATORY_CARE_PROVIDER_SITE_OTHER): Payer: Medicare Other | Admitting: Podiatry

## 2015-06-23 ENCOUNTER — Ambulatory Visit (INDEPENDENT_AMBULATORY_CARE_PROVIDER_SITE_OTHER): Payer: Medicare Other

## 2015-06-23 DIAGNOSIS — J302 Other seasonal allergic rhinitis: Secondary | ICD-10-CM | POA: Insufficient documentation

## 2015-06-23 DIAGNOSIS — E7800 Pure hypercholesterolemia, unspecified: Secondary | ICD-10-CM | POA: Insufficient documentation

## 2015-06-23 DIAGNOSIS — I10 Essential (primary) hypertension: Secondary | ICD-10-CM | POA: Insufficient documentation

## 2015-06-23 DIAGNOSIS — M542 Cervicalgia: Secondary | ICD-10-CM | POA: Insufficient documentation

## 2015-06-23 DIAGNOSIS — M2010 Hallux valgus (acquired), unspecified foot: Secondary | ICD-10-CM | POA: Diagnosis not present

## 2015-06-23 DIAGNOSIS — D509 Iron deficiency anemia, unspecified: Secondary | ICD-10-CM | POA: Insufficient documentation

## 2015-06-23 DIAGNOSIS — E039 Hypothyroidism, unspecified: Secondary | ICD-10-CM | POA: Insufficient documentation

## 2015-06-23 DIAGNOSIS — J309 Allergic rhinitis, unspecified: Secondary | ICD-10-CM | POA: Insufficient documentation

## 2015-06-23 DIAGNOSIS — IMO0001 Reserved for inherently not codable concepts without codable children: Secondary | ICD-10-CM | POA: Insufficient documentation

## 2015-06-23 DIAGNOSIS — M5412 Radiculopathy, cervical region: Secondary | ICD-10-CM | POA: Insufficient documentation

## 2015-06-23 DIAGNOSIS — L309 Dermatitis, unspecified: Secondary | ICD-10-CM | POA: Insufficient documentation

## 2015-06-23 DIAGNOSIS — M545 Low back pain: Secondary | ICD-10-CM | POA: Insufficient documentation

## 2015-06-23 DIAGNOSIS — K5900 Constipation, unspecified: Secondary | ICD-10-CM | POA: Insufficient documentation

## 2015-06-23 DIAGNOSIS — H60543 Acute eczematoid otitis externa, bilateral: Secondary | ICD-10-CM

## 2015-06-23 DIAGNOSIS — K219 Gastro-esophageal reflux disease without esophagitis: Secondary | ICD-10-CM | POA: Insufficient documentation

## 2015-06-23 NOTE — Progress Notes (Signed)
Subjective:    Patient ID: Shelly Pearson, female    DOB: 06/09/1941, 74 y.o.   MRN: [id number]  HPI: She presents today as a 74 year old female with a chief complaint of painful bunion deformities since 1988. She is diagnosed with bunions at that time and was told they would get worse. She states that as of recently they have developed considerable discomfort particularly the left foot. She states that she is starting to have overlapping toes and pain beneath the fourth digit of the left foot and on the fifth digit of the left foot in particular. She states these toes seem to wrap around and causing a lot of pain when I walk. She does have tenderness on palpation and on ambulation of the first metatarsophalangeal joint bilaterally left greater than right. She is really done nothing other anti-inflammatories in the past and stiffer shoe gear such as orthotics during the winter time and loosefitting sandals during the summertime to help prevent irritation from the first metatarsophalangeal joint.    Review of Systems  HENT: Positive for sinus pressure.   Musculoskeletal: Positive for arthralgias.  All other systems reviewed and are negative.      Objective:   Physical Exam: 74 year old female presents today in no apparent distress vital signs stable alert and oriented 3. Pulses are strongly palpable. Neurologic sensorium is intact per Semmes-Weinstein monofilament. Deep tendon reflexes are intact bilateral and muscle strength is 5 over 5 dorsiflexion plantar flexors and inverters and everters all intrinsic muscular is intact. Orthopedic evaluation demonstrates all joints distal to the ankle level for range of motion without crepitation. She has adductovarus rotated hammertoe deformities bilateral. She has reactive hyperkeratosis overlying the medial aspect of each toe distally. She also has pain on range of motion of the first metatarsophalangeal joints bilateral left greater than right as well  as pain on palpation of the hypertrophic medial condyle of the first metatarsal again left greater than right. The toe appears to be reducible in nature appears to be tracking but not yet tied down. Radiographs taken today do demonstrate hallux abductovalgus deformity with an increase in the first intermetatarsal angle greater than the normal value bilateral foot. Again adductovarus rotated hammertoe deformity is also visible here. No other osseous abnormalities are noted. Cutaneous evaluation demonstrates supple well-hydrated cutis no erythema edema cellulitis drainage or odor.        Assessment & Plan:  Assessment: Hallux abductovalgus deformity left foot greater than the right. Hammertoe deformity fifth digit left foot greater than the right.  Plan: We discussed the etiology pathology conservative versus surgical therapies. At this point I have recommended surgical correction reduce the first intermetatarsal angle and to derotate the fifth toe. We went over the consent form today line by line number by number giving her ample time to ask questions she saw fit regarding an Austin bunion repair with screw fixation left foot and a derotational arthroplasty fifth digit. I answered all the questions regarding these procedures to the best of my ability in layman's terms. She understands this is amenable to it in sign of revision of the consent form. Of course we did go over the possible complications which may include but are not limited to postop pain bleeding swelling infection recurrence need for further surgery overcorrection under correction development of blood clots in the lungs they could result in death. She understands this is amenable to it so Dr. patient the consent form. We dispensed a cam walker. I will follow up with  her in the near future for surgical intervention for the left foot and we will try to overlap the healing time by performing surgery to the right foot immediately following the  left.

## 2015-06-23 NOTE — Patient Instructions (Signed)
Pre-Operative Instructions  Congratulations, you have decided to take an important step to improving your quality of life.  You can be assured that the doctors of [facility] will be with you every step of the way.  1. Plan to be at the surgery center/hospital at least 1 (one) hour prior to your scheduled time unless otherwise directed by the surgical center/hospital staff.  You must have a responsible adult accompany you, remain during the surgery and drive you home.  Make sure you have directions to the surgical center/hospital and know how to get there on time. 2. For hospital based surgery you will need to obtain a history and physical form from your family physician within 1 month prior to the date of surgery- we will give you a form for you primary physician.  3. We make every effort to accommodate the date you request for surgery.  There are however, times where surgery dates or times have to be moved.  We will contact you as soon as possible if a change in schedule is required.   4. No Aspirin/Ibuprofen for one week before surgery.  If you are on aspirin, any non-steroidal anti-inflammatory medications (Mobic, Aleve, Ibuprofen) you should stop taking it 7 days prior to your surgery.  You make take Tylenol  For pain prior to surgery.  5. Medications- If you are taking daily heart and blood pressure medications, seizure, reflux, allergy, asthma, anxiety, pain or diabetes medications, make sure the surgery center/hospital is aware before the day of surgery so they may notify you which medications to take or avoid the day of surgery. 6. No food or drink after midnight the night before surgery unless directed otherwise by surgical center/hospital staff. 7. No alcoholic beverages 24 hours prior to surgery.  No smoking 24 hours prior to or 24 hours after surgery. 8. Wear loose pants or shorts- loose enough to fit over bandages, boots, and casts. 9. No slip on shoes, sneakers are best. 10. Bring  your boot with you to the surgery center/hospital.  Also bring crutches or a walker if your physician has prescribed it for you.  If you do not have this equipment, it will be provided for you after surgery. 11. If you have not been contracted by the surgery center/hospital by the day before your surgery, call to confirm the date and time of your surgery. 12. Leave-time from work may vary depending on the type of surgery you have.  Appropriate arrangements should be made prior to surgery with your employer. 13. Prescriptions will be provided immediately following surgery by your doctor.  Have these filled as soon as possible after surgery and take the medication as directed. 14. Remove nail polish on the operative foot. 15. Wash the night before surgery.  The night before surgery wash the foot and leg well with the antibacterial soap provided and water paying special attention to beneath the toenails and in between the toes.  Rinse thoroughly with water and dry well with a towel.  Perform this wash unless told not to do so by your physician.  Enclosed: 1 Ice pack (please put in freezer the night before surgery)   1 Hibiclens skin cleaner   Pre-op Instructions  If you have any questions regarding the instructions, do not hesitate to call our office.  [place]: [address] [place], [place] [address] [phone]  [place]: [address] [place], [place] [address] [phone]  [place]: [address]  [place], [place] [address] [phone]  Dr. Richard   Tuchman DPM, Dr. Norman Regal DPM Dr. Richard Sikora DPM, Dr. M. Todd Hyatt DPM, Dr. Kathryn Egerton DPM 

## 2015-07-07 ENCOUNTER — Telehealth: Payer: Self-pay | Admitting: *Deleted

## 2015-07-07 NOTE — Telephone Encounter (Signed)
"  I saw Dr. Milinda Pointer before Christmas and I'm scheduled to have a Bunion surgery on my left foot on February 20.  At the time I saw him he mentioned scheduling a date for the other foot as well.  Do I schedule that now or do I wait until after the first one is done?  Please call and let me know.  I hope you are having a good day and a Happy New Year."

## 2015-07-08 NOTE — Telephone Encounter (Signed)
"  I'm returning your call from yesterday.  Dr. Milinda Pointer will schedule your other foot after you have the first one done.  He determine when would be a good time to schedule second foot based on the progression of the first foot.  "Okay, that is what I wanted to know.  It came to my mind whether or not he wanted me to go ahead and schedule it."

## 2015-07-22 ENCOUNTER — Telehealth: Payer: Self-pay | Admitting: *Deleted

## 2015-07-22 NOTE — Telephone Encounter (Signed)
"  I'm scheduled for surgery on Friday.  I was told to bring my boot with me.  I had both feet done previously on both feet.  I must have thrown those away.  I did find one that says [employer] and it has a bulb to inflate.  Will that be okay?"  Yes that boot should be okay.  Is there any other instructions I need to know or do prior to surgery?"  No, just remember not to eat anything after midnight or if it's closer to noon, nothing 6-8 hours prior to surgery time.

## 2015-07-23 ENCOUNTER — Other Ambulatory Visit: Payer: Self-pay | Admitting: Podiatry

## 2015-07-23 MED ORDER — PROMETHAZINE HCL 25 MG PO TABS: 25.0000 mg | ORAL_TABLET | Freq: Three times a day (TID) | ORAL | Status: DC | PRN

## 2015-07-23 MED ORDER — CEPHALEXIN 500 MG PO CAPS: 500.0000 mg | ORAL_CAPSULE | Freq: Three times a day (TID) | ORAL | Status: DC

## 2015-07-23 MED ORDER — OXYCODONE-ACETAMINOPHEN 10-325 MG PO TABS: ORAL_TABLET | ORAL | Status: DC

## 2015-07-24 ENCOUNTER — Encounter: Payer: Self-pay | Admitting: *Deleted

## 2015-07-24 DIAGNOSIS — M2042 Other hammer toe(s) (acquired), left foot: Secondary | ICD-10-CM | POA: Diagnosis not present

## 2015-07-24 DIAGNOSIS — I10 Essential (primary) hypertension: Secondary | ICD-10-CM | POA: Diagnosis not present

## 2015-07-24 DIAGNOSIS — M25572 Pain in left ankle and joints of left foot: Secondary | ICD-10-CM | POA: Diagnosis not present

## 2015-07-24 DIAGNOSIS — M2012 Hallux valgus (acquired), left foot: Secondary | ICD-10-CM | POA: Diagnosis not present

## 2015-07-30 ENCOUNTER — Telehealth: Payer: Self-pay | Admitting: *Deleted

## 2015-07-30 ENCOUNTER — Ambulatory Visit (INDEPENDENT_AMBULATORY_CARE_PROVIDER_SITE_OTHER): Payer: Medicare Other

## 2015-07-30 ENCOUNTER — Encounter: Payer: Self-pay | Admitting: Podiatry

## 2015-07-30 ENCOUNTER — Ambulatory Visit (INDEPENDENT_AMBULATORY_CARE_PROVIDER_SITE_OTHER): Payer: Medicare Other | Admitting: Podiatry

## 2015-07-30 VITALS — BP 135/79 | HR 84 | Resp 16

## 2015-07-30 DIAGNOSIS — Z9889 Other specified postprocedural states: Secondary | ICD-10-CM

## 2015-07-30 DIAGNOSIS — M2010 Hallux valgus (acquired), unspecified foot: Secondary | ICD-10-CM

## 2015-07-30 DIAGNOSIS — M2011 Hallux valgus (acquired), right foot: Secondary | ICD-10-CM

## 2015-07-30 DIAGNOSIS — M2041 Other hammer toe(s) (acquired), right foot: Secondary | ICD-10-CM

## 2015-07-30 NOTE — Progress Notes (Signed)
She presents today one day status post Austin bunion repair left foot and hammertoe fifth digit left foot. She denies fever chills nausea vomiting muscle aches and pains and says really she has done very well and would like to consider going ahead and scheduling the contralateral foot so she can double up on recovery.  Objective: Vital signs are stable she is alert and oriented 3. I evaluated the right foot first and she basically has the same thing she had on her left foot. Moderate to severe bunion deformity with pain on range of motion of the first metatarsophalangeal joint and an adductovarus rotated hammertoe deformity fifth right. Left foot demonstrates a sterile dressing intact was removed and a straight minimal edema no erythema saline as drainage or odor some ecchymosis to the toes good range of motion of the first metatarsophalangeal joint with sutures are intact margins well coapted. Radiographs confirm good screw position and osteotomy correction first metatarsal left foot.  Assessment: Hallux abductovalgus deformity repair on the left foot and hammertoe repair left foot. This is healing very well. Hallux abductovalgus deformity to the right foot and fifth digit are extremely painful and would like to have these corrected.  Plan: Redressed the first metatarsal and the fifth toe of the left foot today and placed her back in her Cam Walker. Encouraged range of motion exercises follow-up with her in 1 week. We also consented her today for surgery to the contralateral foot consisting of an Austin bunion repair and hammertoe repair fifth right. I answered all the questions regarding these procedures to the best of my ability in layman's terms. She understood it was amenable to it and signed all 3 cages and the consent form. Follow-up with her in 1 week for surgical intervention on the right foot. We will then remove sutures to the left foot.

## 2015-07-30 NOTE — Telephone Encounter (Signed)
Dr. Milinda Pointer had a cancellation for 08/07/2015.  Would you like to reschedule your surgery to then?  "Yes, I would.  I have an appointment scheduled for 08/11/2015 to check the foot I had surgery on already.  Do I need to cancel it?"  That's for your suture removal.  Dr. Milinda Pointer said he can take stitches out of that foot the day of surgery for the right foot.  "Okay, that sounds great."

## 2015-07-30 NOTE — Patient Instructions (Signed)
Pre-Operative Instructions  Congratulations, you have decided to take an important step to improving your quality of life.  You can be assured that the doctors of [facility] will be with you every step of the way.  1. Plan to be at the surgery center/hospital at least 1 (one) hour prior to your scheduled time unless otherwise directed by the surgical center/hospital staff.  You must have a responsible adult accompany you, remain during the surgery and drive you home.  Make sure you have directions to the surgical center/hospital and know how to get there on time. 2. For hospital based surgery you will need to obtain a history and physical form from your family physician within 1 month prior to the date of surgery- we will give you a form for you primary physician.  3. We make every effort to accommodate the date you request for surgery.  There are however, times where surgery dates or times have to be moved.  We will contact you as soon as possible if a change in schedule is required.   4. No Aspirin/Ibuprofen for one week before surgery.  If you are on aspirin, any non-steroidal anti-inflammatory medications (Mobic, Aleve, Ibuprofen) you should stop taking it 7 days prior to your surgery.  You make take Tylenol  For pain prior to surgery.  5. Medications- If you are taking daily heart and blood pressure medications, seizure, reflux, allergy, asthma, anxiety, pain or diabetes medications, make sure the surgery center/hospital is aware before the day of surgery so they may notify you which medications to take or avoid the day of surgery. 6. No food or drink after midnight the night before surgery unless directed otherwise by surgical center/hospital staff. 7. No alcoholic beverages 24 hours prior to surgery.  No smoking 24 hours prior to or 24 hours after surgery. 8. Wear loose pants or shorts- loose enough to fit over bandages, boots, and casts. 9. No slip on shoes, sneakers are best. 10. Bring  your boot with you to the surgery center/hospital.  Also bring crutches or a walker if your physician has prescribed it for you.  If you do not have this equipment, it will be provided for you after surgery. 11. If you have not been contracted by the surgery center/hospital by the day before your surgery, call to confirm the date and time of your surgery. 12. Leave-time from work may vary depending on the type of surgery you have.  Appropriate arrangements should be made prior to surgery with your employer. 13. Prescriptions will be provided immediately following surgery by your doctor.  Have these filled as soon as possible after surgery and take the medication as directed. 14. Remove nail polish on the operative foot. 15. Wash the night before surgery.  The night before surgery wash the foot and leg well with the antibacterial soap provided and water paying special attention to beneath the toenails and in between the toes.  Rinse thoroughly with water and dry well with a towel.  Perform this wash unless told not to do so by your physician.  Enclosed: 1 Ice pack (please put in freezer the night before surgery)   1 Hibiclens skin cleaner   Pre-op Instructions  If you have any questions regarding the instructions, do not hesitate to call our office.  [place]: [address] [place], [place] [address] [phone]  [place]: [address] [place], [place] [address] [phone]  [place]: [address]  [place], [place] [address] [phone]  Dr. Richard   Tuchman DPM, Dr. Norman Regal DPM Dr. Richard Sikora DPM, Dr. M. Todd Hyatt DPM, Dr. Kathryn Egerton DPM 

## 2015-07-30 NOTE — Progress Notes (Signed)
PATIENT HAD SURGERY AT [facility].  DOS 07-24-2015  AUSTIN BUNIONECTOMY LT,  HAMMER TOE 5TH LT FOOT

## 2015-08-06 ENCOUNTER — Other Ambulatory Visit: Payer: Self-pay | Admitting: Podiatry

## 2015-08-06 MED ORDER — OXYCODONE-ACETAMINOPHEN 10-325 MG PO TABS: 1.0000 | ORAL_TABLET | Freq: Four times a day (QID) | ORAL | Status: DC | PRN

## 2015-08-06 MED ORDER — CEPHALEXIN 500 MG PO CAPS: 500.0000 mg | ORAL_CAPSULE | Freq: Three times a day (TID) | ORAL | Status: DC

## 2015-08-06 MED ORDER — PROMETHAZINE HCL 25 MG PO TABS: 25.0000 mg | ORAL_TABLET | Freq: Three times a day (TID) | ORAL | Status: DC | PRN

## 2015-08-07 ENCOUNTER — Encounter: Payer: Self-pay | Admitting: Podiatry

## 2015-08-07 DIAGNOSIS — M25571 Pain in right ankle and joints of right foot: Secondary | ICD-10-CM | POA: Diagnosis not present

## 2015-08-07 DIAGNOSIS — I10 Essential (primary) hypertension: Secondary | ICD-10-CM | POA: Diagnosis not present

## 2015-08-07 DIAGNOSIS — M2011 Hallux valgus (acquired), right foot: Secondary | ICD-10-CM | POA: Diagnosis not present

## 2015-08-11 ENCOUNTER — Encounter: Payer: Self-pay | Admitting: Podiatry

## 2015-08-14 ENCOUNTER — Ambulatory Visit (INDEPENDENT_AMBULATORY_CARE_PROVIDER_SITE_OTHER): Payer: Medicare Other | Admitting: Podiatry

## 2015-08-14 ENCOUNTER — Ambulatory Visit: Payer: Medicare Other

## 2015-08-14 VITALS — Temp 96.2°F

## 2015-08-14 DIAGNOSIS — Z9889 Other specified postprocedural states: Secondary | ICD-10-CM

## 2015-08-14 DIAGNOSIS — M2010 Hallux valgus (acquired), unspecified foot: Secondary | ICD-10-CM

## 2015-08-17 NOTE — Progress Notes (Signed)
Subjective:     Patient ID: Shelly Pearson, female   DOB: [birth year], 75 y.o.   MRN: [id number]  HPI patient states I'm doing well with my right foot with minimal discomfort or pain   Review of Systems     Objective:   Physical Exam Neurovascular status intact negative Homans sign noted with well-healing surgical site right first metatarsal fifth digit with wound edges well Coapted and good range of motion of the first MPJ    Assessment:     Doing well post surgery right foot    Plan:     X-rays reviewed and sterile dressing reapplied along with continued elevation immobilization and range of motion exercises for the first MPJ. Patient will return in the next several weeks for Dr. Milinda Pointer to reevaluate or earlier if any issues were to occur  X-ray report indicates well-healing surgical sites right with good alignment noted and satisfactory bone resection

## 2015-08-20 ENCOUNTER — Encounter: Payer: Self-pay | Admitting: Podiatry

## 2015-08-25 ENCOUNTER — Encounter: Payer: Self-pay | Admitting: Podiatry

## 2015-08-25 ENCOUNTER — Ambulatory Visit (INDEPENDENT_AMBULATORY_CARE_PROVIDER_SITE_OTHER): Payer: Medicare Other | Admitting: Podiatry

## 2015-08-25 ENCOUNTER — Ambulatory Visit (INDEPENDENT_AMBULATORY_CARE_PROVIDER_SITE_OTHER): Payer: Medicare Other

## 2015-08-25 VITALS — BP 117/73 | HR 82 | Resp 16

## 2015-08-25 DIAGNOSIS — M2011 Hallux valgus (acquired), right foot: Secondary | ICD-10-CM | POA: Diagnosis not present

## 2015-08-25 DIAGNOSIS — Z9889 Other specified postprocedural states: Secondary | ICD-10-CM

## 2015-08-25 DIAGNOSIS — M2041 Other hammer toe(s) (acquired), right foot: Secondary | ICD-10-CM | POA: Diagnosis not present

## 2015-08-25 NOTE — Progress Notes (Signed)
She presents today for follow-up of her Liane Comber bunion repair and derotational arthroplasty fifth digit right foot date of surgery 08/07/2015. She states that she is doing very well. Her contralateral foot was performed 2 weeks prior to that. Same procedure. She states that everything is going great.  Objective: Vital signs are stable she is alert and oriented 3. Pulses are palpable. She has great range of motion bilaterally. Sutures are intact to the surgical sites of the right foot and were removed today. Margins remain well coapted there is no signs of infection.  Assessment: Well-healing surgical feet bilateral.  Plan: Sutures removed today. I will request that she remain in her Darco shoe for the next 2 weeks and then try to progress to a pair of tennis shoes bilaterally. I will follow-up with her in 2 weeks.

## 2015-09-08 ENCOUNTER — Ambulatory Visit (INDEPENDENT_AMBULATORY_CARE_PROVIDER_SITE_OTHER): Payer: Medicare Other | Admitting: Podiatry

## 2015-09-08 ENCOUNTER — Encounter: Payer: Self-pay | Admitting: Podiatry

## 2015-09-08 ENCOUNTER — Ambulatory Visit (INDEPENDENT_AMBULATORY_CARE_PROVIDER_SITE_OTHER): Payer: Medicare Other

## 2015-09-08 VITALS — BP 127/75 | HR 84 | Resp 16

## 2015-09-08 DIAGNOSIS — Z9889 Other specified postprocedural states: Secondary | ICD-10-CM | POA: Diagnosis not present

## 2015-09-08 DIAGNOSIS — M2011 Hallux valgus (acquired), right foot: Secondary | ICD-10-CM

## 2015-09-08 DIAGNOSIS — M2041 Other hammer toe(s) (acquired), right foot: Secondary | ICD-10-CM | POA: Diagnosis not present

## 2015-09-09 NOTE — Progress Notes (Signed)
She presents today for bilateral postop visits. First procedure of the left foot was performed on 07/24/2015. The same procedure contralateral foot was performed on 08/07/2015. Both feet had Austin bunion repair and hammertoe fifth bilateral. She states the right one which was the most recent is still painful. Particularly the fifth toe. She is able to get her foot in her tissue left but not on the right yet.  Objective: Vital signs stable alert and oriented 3. Pulses are palpable. Neurologic sensorium is intact per Semmes-Weinstein monofilament. Deep tendon reflexes are intact. She has great range of motion of the first metatarsophalangeal joints bilaterally. Fifth digit right foot is still moderately swollen. Radiographs confirm well-healing osteotomies bilateral.  Assessment: Well-healing surgical foot.  Plan: I encouraged her to dress the fifth toe of the right foot with compression dressing and try to get into a regular shoe is loose and comfortable for the right foot. I will follow up with her in 1 month. She will have bilateral x-rays done in the next time she comes in.

## 2015-09-14 DIAGNOSIS — E039 Hypothyroidism, unspecified: Secondary | ICD-10-CM | POA: Diagnosis not present

## 2015-09-14 DIAGNOSIS — E7800 Pure hypercholesterolemia, unspecified: Secondary | ICD-10-CM | POA: Diagnosis not present

## 2015-09-14 DIAGNOSIS — K219 Gastro-esophageal reflux disease without esophagitis: Secondary | ICD-10-CM | POA: Diagnosis not present

## 2015-09-14 DIAGNOSIS — Z0000 Encounter for general adult medical examination without abnormal findings: Secondary | ICD-10-CM | POA: Diagnosis not present

## 2015-09-14 DIAGNOSIS — N951 Menopausal and female climacteric states: Secondary | ICD-10-CM | POA: Diagnosis not present

## 2015-09-14 DIAGNOSIS — I499 Cardiac arrhythmia, unspecified: Secondary | ICD-10-CM | POA: Diagnosis not present

## 2015-09-14 DIAGNOSIS — I10 Essential (primary) hypertension: Secondary | ICD-10-CM | POA: Diagnosis not present

## 2015-09-14 DIAGNOSIS — N3941 Urge incontinence: Secondary | ICD-10-CM | POA: Diagnosis not present

## 2015-09-22 NOTE — Progress Notes (Signed)
Patient ID: Shelly Pearson, female   DOB: Jul 23, 1940, 75 y.o.   MRN: [id number] Dr Milinda Pointer performed an Sabino Donovan repair (right foot),Hammer toe 5th toe-(right foot) on 08/07/2015 at the [facility].

## 2015-10-08 ENCOUNTER — Encounter: Payer: Self-pay | Admitting: Podiatry

## 2015-10-08 ENCOUNTER — Ambulatory Visit (INDEPENDENT_AMBULATORY_CARE_PROVIDER_SITE_OTHER): Payer: Medicare Other

## 2015-10-08 ENCOUNTER — Ambulatory Visit: Payer: Medicare Other

## 2015-10-08 ENCOUNTER — Ambulatory Visit (INDEPENDENT_AMBULATORY_CARE_PROVIDER_SITE_OTHER): Payer: Medicare Other | Admitting: Podiatry

## 2015-10-08 VITALS — BP 123/68 | HR 65 | Resp 16

## 2015-10-08 DIAGNOSIS — M2041 Other hammer toe(s) (acquired), right foot: Secondary | ICD-10-CM

## 2015-10-08 DIAGNOSIS — M2040 Other hammer toe(s) (acquired), unspecified foot: Secondary | ICD-10-CM

## 2015-10-08 DIAGNOSIS — M2010 Hallux valgus (acquired), unspecified foot: Secondary | ICD-10-CM

## 2015-10-08 DIAGNOSIS — Z9889 Other specified postprocedural states: Secondary | ICD-10-CM

## 2015-10-08 DIAGNOSIS — M2011 Hallux valgus (acquired), right foot: Secondary | ICD-10-CM

## 2015-10-08 NOTE — Progress Notes (Signed)
She presents today for her final follow-up visit regarding Shelly Pearson bunion repairs bilateral foot from January February 2017. She states that she is doing very well and she certainly happy with her surgical results. She denies fever chills nausea vomiting muscle aches and pain shortness of breath or chest pain. She denies calf pain. She states that the right foot still swells a little bit but it is 2 weeks behind the left one.  Objective: Vital signs are stable she is alert and oriented 3. Pulses are strongly palpable. Neurologic sensorium is intact. She has great range of motion of the first metatarsophalangeal joints bilaterally though slightly restricted on plantarflexion of the right foot at the metatarsophalangeal joint. Minimal edema right greater than left and no signs of infection. Radiographic evaluation demonstrates surgical sites appear to be healed 100% with internal fixation intact. Cutaneous evaluation of x-rays supple well-hydrated cutis scars appear to be healing very nicely.  Assessment: Well-healing surgical foot status post Austin bunion repair bilateral.  Plan: Follow up with me on an as-needed basis.

## 2015-10-21 DIAGNOSIS — Z1211 Encounter for screening for malignant neoplasm of colon: Secondary | ICD-10-CM | POA: Diagnosis not present

## 2015-11-04 DIAGNOSIS — I499 Cardiac arrhythmia, unspecified: Secondary | ICD-10-CM | POA: Diagnosis not present

## 2015-11-04 DIAGNOSIS — E7800 Pure hypercholesterolemia, unspecified: Secondary | ICD-10-CM | POA: Diagnosis not present

## 2015-11-04 DIAGNOSIS — E039 Hypothyroidism, unspecified: Secondary | ICD-10-CM | POA: Diagnosis not present

## 2015-11-04 DIAGNOSIS — I10 Essential (primary) hypertension: Secondary | ICD-10-CM | POA: Diagnosis not present

## 2015-11-20 ENCOUNTER — Encounter: Payer: Self-pay | Admitting: Podiatry

## 2016-02-15 DIAGNOSIS — T63481A Toxic effect of venom of other arthropod, accidental (unintentional), initial encounter: Secondary | ICD-10-CM | POA: Diagnosis not present

## 2016-02-22 ENCOUNTER — Encounter: Payer: Self-pay | Admitting: Podiatry

## 2016-03-02 DIAGNOSIS — I10 Essential (primary) hypertension: Secondary | ICD-10-CM | POA: Diagnosis not present

## 2016-03-02 DIAGNOSIS — L299 Pruritus, unspecified: Secondary | ICD-10-CM | POA: Diagnosis not present

## 2016-03-02 DIAGNOSIS — E039 Hypothyroidism, unspecified: Secondary | ICD-10-CM | POA: Diagnosis not present

## 2016-03-02 DIAGNOSIS — E7800 Pure hypercholesterolemia, unspecified: Secondary | ICD-10-CM | POA: Diagnosis not present

## 2016-03-14 DIAGNOSIS — M5430 Sciatica, unspecified side: Secondary | ICD-10-CM | POA: Diagnosis not present

## 2016-03-14 DIAGNOSIS — Z23 Encounter for immunization: Secondary | ICD-10-CM | POA: Diagnosis not present

## 2016-03-28 DIAGNOSIS — M5431 Sciatica, right side: Secondary | ICD-10-CM | POA: Diagnosis not present

## 2016-03-28 DIAGNOSIS — M5430 Sciatica, unspecified side: Secondary | ICD-10-CM | POA: Diagnosis not present

## 2016-04-04 DIAGNOSIS — M5431 Sciatica, right side: Secondary | ICD-10-CM | POA: Diagnosis not present

## 2016-04-04 DIAGNOSIS — M5430 Sciatica, unspecified side: Secondary | ICD-10-CM | POA: Diagnosis not present

## 2016-04-07 DIAGNOSIS — M5430 Sciatica, unspecified side: Secondary | ICD-10-CM | POA: Diagnosis not present

## 2016-04-07 DIAGNOSIS — M5431 Sciatica, right side: Secondary | ICD-10-CM | POA: Diagnosis not present

## 2016-04-11 DIAGNOSIS — M5431 Sciatica, right side: Secondary | ICD-10-CM | POA: Diagnosis not present

## 2016-04-11 DIAGNOSIS — M5430 Sciatica, unspecified side: Secondary | ICD-10-CM | POA: Diagnosis not present

## 2016-04-14 DIAGNOSIS — M5430 Sciatica, unspecified side: Secondary | ICD-10-CM | POA: Diagnosis not present

## 2016-04-14 DIAGNOSIS — M5431 Sciatica, right side: Secondary | ICD-10-CM | POA: Diagnosis not present

## 2016-04-18 DIAGNOSIS — M5431 Sciatica, right side: Secondary | ICD-10-CM | POA: Diagnosis not present

## 2016-04-18 DIAGNOSIS — M5430 Sciatica, unspecified side: Secondary | ICD-10-CM | POA: Diagnosis not present

## 2016-04-21 DIAGNOSIS — M5431 Sciatica, right side: Secondary | ICD-10-CM | POA: Diagnosis not present

## 2016-04-21 DIAGNOSIS — M5430 Sciatica, unspecified side: Secondary | ICD-10-CM | POA: Diagnosis not present

## 2016-05-09 DIAGNOSIS — D1801 Hemangioma of skin and subcutaneous tissue: Secondary | ICD-10-CM | POA: Diagnosis not present

## 2016-05-09 DIAGNOSIS — L570 Actinic keratosis: Secondary | ICD-10-CM | POA: Diagnosis not present

## 2016-05-09 DIAGNOSIS — Z1283 Encounter for screening for malignant neoplasm of skin: Secondary | ICD-10-CM | POA: Diagnosis not present

## 2016-05-09 DIAGNOSIS — L821 Other seborrheic keratosis: Secondary | ICD-10-CM | POA: Diagnosis not present

## 2016-05-09 DIAGNOSIS — L820 Inflamed seborrheic keratosis: Secondary | ICD-10-CM | POA: Diagnosis not present

## 2016-05-09 DIAGNOSIS — Z809 Family history of malignant neoplasm, unspecified: Secondary | ICD-10-CM | POA: Diagnosis not present

## 2016-06-02 DIAGNOSIS — Z1231 Encounter for screening mammogram for malignant neoplasm of breast: Secondary | ICD-10-CM | POA: Diagnosis not present

## 2016-06-02 DIAGNOSIS — Z803 Family history of malignant neoplasm of breast: Secondary | ICD-10-CM | POA: Diagnosis not present

## 2016-06-09 ENCOUNTER — Ambulatory Visit (INDEPENDENT_AMBULATORY_CARE_PROVIDER_SITE_OTHER): Payer: Medicare Other | Admitting: Podiatry

## 2016-06-09 ENCOUNTER — Ambulatory Visit (INDEPENDENT_AMBULATORY_CARE_PROVIDER_SITE_OTHER): Payer: Medicare Other

## 2016-06-09 ENCOUNTER — Telehealth: Payer: Self-pay | Admitting: *Deleted

## 2016-06-09 ENCOUNTER — Encounter: Payer: Self-pay | Admitting: Podiatry

## 2016-06-09 DIAGNOSIS — M779 Enthesopathy, unspecified: Secondary | ICD-10-CM

## 2016-06-09 DIAGNOSIS — M1990 Unspecified osteoarthritis, unspecified site: Secondary | ICD-10-CM

## 2016-06-09 MED ORDER — DICLOFENAC SODIUM 1 % TD GEL: 4.0000 g | Freq: Four times a day (QID) | TRANSDERMAL | 2 refills | Status: DC

## 2016-06-09 NOTE — Telephone Encounter (Signed)
Pt states voltaren gel instructions state cover the entire foot with the cream. Pt states her right foot has pain at the toes and the left in the arch.I told pt it would be fine to cover the affected areas.

## 2016-06-12 NOTE — Progress Notes (Signed)
She presents today with a chief complain of arch pain and plantar forefoot pain right foot. She states that previous foot surgery bilateral I feel these electrical sensations in the ball of my right foot off and on since surgery.  Objective: Vital signs are stable she is alert and oriented 3. Pulses are palpable. Neurologic sensorium is intact. Deep tendon reflexes are intact. Muscle strength was 5 over 5 dorsiflexion plantar flexors and inverters everters onto the musculatures intact. Orthopedic evaluation was resolved joints distal to the ankle for range of motion or crepitus she does have tenderness on palpation of the second metatarsophalangeal joint and radiographs do support some early osteoarthritic changes. History of bunion repair.  Assessment: Capsulitis second metatarsophalangeal joint right  Plan: Considered injection therapy but had rather tenuous topical anti-inflammatories and follow up with me as needed.

## 2016-09-15 DIAGNOSIS — K219 Gastro-esophageal reflux disease without esophagitis: Secondary | ICD-10-CM | POA: Diagnosis not present

## 2016-09-15 DIAGNOSIS — N3941 Urge incontinence: Secondary | ICD-10-CM | POA: Diagnosis not present

## 2016-09-15 DIAGNOSIS — D509 Iron deficiency anemia, unspecified: Secondary | ICD-10-CM | POA: Diagnosis not present

## 2016-09-15 DIAGNOSIS — E7800 Pure hypercholesterolemia, unspecified: Secondary | ICD-10-CM | POA: Diagnosis not present

## 2016-09-15 DIAGNOSIS — I10 Essential (primary) hypertension: Secondary | ICD-10-CM | POA: Diagnosis not present

## 2016-09-15 DIAGNOSIS — E039 Hypothyroidism, unspecified: Secondary | ICD-10-CM | POA: Diagnosis not present

## 2016-09-15 DIAGNOSIS — Z0000 Encounter for general adult medical examination without abnormal findings: Secondary | ICD-10-CM | POA: Diagnosis not present

## 2016-11-01 ENCOUNTER — Encounter: Payer: Self-pay | Admitting: Podiatry

## 2016-11-01 ENCOUNTER — Ambulatory Visit (INDEPENDENT_AMBULATORY_CARE_PROVIDER_SITE_OTHER): Payer: Medicare Other | Admitting: Podiatry

## 2016-11-01 DIAGNOSIS — M779 Enthesopathy, unspecified: Secondary | ICD-10-CM | POA: Diagnosis not present

## 2016-11-01 NOTE — Progress Notes (Signed)
She presents today for follow-up of capsulitis and arch pain states them still having some pain.  Objective: Vital signs are stable alert and oriented 3. Topical pain medication did not work. She still has pain on end range of motion of the second metatarsophalangeal joint of the right foot.  Assessment: Capsulitis second metatarsophalangeal joint right.  Plan: I injected the area today plantarly dexamethasone and local anesthetic to the point of maximal tenderness. We discussed appropriate shoe gear stretching size and ice therapy follow-up with her in a month.

## 2016-11-14 DIAGNOSIS — L821 Other seborrheic keratosis: Secondary | ICD-10-CM | POA: Diagnosis not present

## 2016-11-14 DIAGNOSIS — Z85828 Personal history of other malignant neoplasm of skin: Secondary | ICD-10-CM | POA: Diagnosis not present

## 2016-11-14 DIAGNOSIS — L820 Inflamed seborrheic keratosis: Secondary | ICD-10-CM | POA: Diagnosis not present

## 2016-11-14 DIAGNOSIS — Z08 Encounter for follow-up examination after completed treatment for malignant neoplasm: Secondary | ICD-10-CM | POA: Diagnosis not present

## 2016-11-21 DIAGNOSIS — E7800 Pure hypercholesterolemia, unspecified: Secondary | ICD-10-CM | POA: Diagnosis not present

## 2016-12-22 ENCOUNTER — Ambulatory Visit: Payer: Medicare Other | Admitting: Podiatry

## 2017-04-11 DIAGNOSIS — E039 Hypothyroidism, unspecified: Secondary | ICD-10-CM | POA: Diagnosis not present

## 2017-04-11 DIAGNOSIS — R5383 Other fatigue: Secondary | ICD-10-CM | POA: Diagnosis not present

## 2017-04-11 DIAGNOSIS — Z23 Encounter for immunization: Secondary | ICD-10-CM | POA: Diagnosis not present

## 2017-04-11 DIAGNOSIS — R1314 Dysphagia, pharyngoesophageal phase: Secondary | ICD-10-CM | POA: Diagnosis not present

## 2017-04-19 ENCOUNTER — Other Ambulatory Visit: Payer: Self-pay | Admitting: Family Medicine

## 2017-04-19 DIAGNOSIS — Z801 Family history of malignant neoplasm of trachea, bronchus and lung: Secondary | ICD-10-CM

## 2017-04-19 DIAGNOSIS — R1310 Dysphagia, unspecified: Secondary | ICD-10-CM

## 2017-04-19 DIAGNOSIS — R0600 Dyspnea, unspecified: Secondary | ICD-10-CM

## 2017-04-19 DIAGNOSIS — R5383 Other fatigue: Secondary | ICD-10-CM

## 2017-05-10 ENCOUNTER — Ambulatory Visit
Admission: RE | Admit: 2017-05-10 | Discharge: 2017-05-10 | Disposition: A | Discharge status: Home / Self Care | Payer: Medicare Other | Source: Ambulatory Visit | Attending: Family Medicine | Admitting: Family Medicine

## 2017-05-10 DIAGNOSIS — R0602 Shortness of breath: Secondary | ICD-10-CM | POA: Diagnosis not present

## 2017-05-10 DIAGNOSIS — R0600 Dyspnea, unspecified: Secondary | ICD-10-CM

## 2017-05-10 DIAGNOSIS — R5383 Other fatigue: Secondary | ICD-10-CM

## 2017-05-10 DIAGNOSIS — Z801 Family history of malignant neoplasm of trachea, bronchus and lung: Secondary | ICD-10-CM

## 2017-05-10 DIAGNOSIS — K219 Gastro-esophageal reflux disease without esophagitis: Secondary | ICD-10-CM | POA: Diagnosis not present

## 2017-05-10 DIAGNOSIS — R1310 Dysphagia, unspecified: Secondary | ICD-10-CM

## 2017-05-24 DIAGNOSIS — R1314 Dysphagia, pharyngoesophageal phase: Secondary | ICD-10-CM | POA: Diagnosis not present

## 2017-05-31 ENCOUNTER — Other Ambulatory Visit (HOSPITAL_COMMUNITY): Payer: Self-pay | Admitting: Family Medicine

## 2017-05-31 DIAGNOSIS — R1310 Dysphagia, unspecified: Secondary | ICD-10-CM

## 2017-06-05 DIAGNOSIS — Z803 Family history of malignant neoplasm of breast: Secondary | ICD-10-CM | POA: Diagnosis not present

## 2017-06-05 DIAGNOSIS — Z1231 Encounter for screening mammogram for malignant neoplasm of breast: Secondary | ICD-10-CM | POA: Diagnosis not present

## 2017-06-09 ENCOUNTER — Ambulatory Visit (HOSPITAL_COMMUNITY)
Admission: RE | Admit: 2017-06-09 | Discharge: 2017-06-09 | Disposition: A | Discharge status: Home / Self Care | Payer: Medicare Other | Source: Ambulatory Visit | Attending: Family Medicine | Admitting: Family Medicine

## 2017-06-09 DIAGNOSIS — R05 Cough: Secondary | ICD-10-CM | POA: Diagnosis not present

## 2017-06-09 DIAGNOSIS — R1310 Dysphagia, unspecified: Secondary | ICD-10-CM | POA: Insufficient documentation

## 2017-06-09 NOTE — Progress Notes (Signed)
Modified Barium Swallow Progress Note  [name] MRN: [id number] Date of Birth: [birth year]  Today's Date: 06/09/2017  Modified Barium Swallow completed.  Full report located under Chart Review in the Imaging Section.  Brief recommendations include the following:  Clinical Impression  Pt demonstrates normal oral and oropahryngeal function with no aspiration and no coughing episodes observed. Given pts complaint of coughing after consumed solids. Allowed pt eat a full packet of graham crakcers and container of applesauce. Esophageal sweep showed some stasis, ragiologist reports decreased motility. LIquids given after solids eventually assisted with transit. Suspect occasions of laryngopharyngeal reflux due to esophageal stasis that would account for coughing. Discussed baisc strategies and f/u with MD and possibly GI if warranted.    Swallow Evaluation Recommendations   Recommended Consults: Consider GI evaluation   SLP Diet Recommendations: Regular solids;Thin liquid   Liquid Administration via: Cup;Straw   Medication Administration: Whole meds with liquid   Supervision: Patient able to self feed   Compensations: Slow rate;Small sips/bites;Follow solids with liquid   Postural Changes: Seated upright at 90 degrees;Remain semi-upright after after feeds/meals (Comment)   Oral Care Recommendations: Patient independent with oral care        [name], Katherene Ponto 06/09/2017,2:20 PM

## 2017-06-09 NOTE — Progress Notes (Signed)
   06/09/17 1200  SLP G-Codes **NOT FOR INPATIENT CLASS**  Functional Assessment Tool Used clinical judgement  Functional Limitations Swallowing  Swallow Current Status (V1292) CI  Swallow Goal Status (T0903) CI  Swallow Discharge Status (O1499) CI  SLP Evaluations  $ SLP Speech Visit 1 Visit  SLP Evaluations  $Outpatient MBS Swallow 1 Procedure  $Swallowing Treatment 1 Procedure

## 2017-06-14 DIAGNOSIS — M79601 Pain in right arm: Secondary | ICD-10-CM | POA: Diagnosis not present

## 2017-06-14 DIAGNOSIS — R4702 Dysphasia: Secondary | ICD-10-CM | POA: Diagnosis not present

## 2017-07-13 DIAGNOSIS — M25511 Pain in right shoulder: Secondary | ICD-10-CM | POA: Diagnosis not present

## 2017-08-08 ENCOUNTER — Other Ambulatory Visit: Payer: Self-pay | Admitting: Family Medicine

## 2017-08-08 DIAGNOSIS — M25511 Pain in right shoulder: Secondary | ICD-10-CM

## 2017-08-12 ENCOUNTER — Ambulatory Visit
Admission: RE | Admit: 2017-08-12 | Discharge: 2017-08-12 | Disposition: A | Discharge status: Home / Self Care | Payer: Medicare Other | Source: Ambulatory Visit | Attending: Family Medicine | Admitting: Family Medicine

## 2017-08-12 DIAGNOSIS — M75121 Complete rotator cuff tear or rupture of right shoulder, not specified as traumatic: Secondary | ICD-10-CM | POA: Diagnosis not present

## 2017-08-12 DIAGNOSIS — M25511 Pain in right shoulder: Secondary | ICD-10-CM

## 2017-08-21 DIAGNOSIS — M75111 Incomplete rotator cuff tear or rupture of right shoulder, not specified as traumatic: Secondary | ICD-10-CM | POA: Diagnosis not present

## 2017-09-07 DIAGNOSIS — M19011 Primary osteoarthritis, right shoulder: Secondary | ICD-10-CM | POA: Diagnosis not present

## 2017-09-07 DIAGNOSIS — M7541 Impingement syndrome of right shoulder: Secondary | ICD-10-CM | POA: Diagnosis not present

## 2017-09-07 DIAGNOSIS — M24111 Other articular cartilage disorders, right shoulder: Secondary | ICD-10-CM | POA: Diagnosis not present

## 2017-09-07 DIAGNOSIS — G8918 Other acute postprocedural pain: Secondary | ICD-10-CM | POA: Diagnosis not present

## 2017-09-07 DIAGNOSIS — S46011A Strain of muscle(s) and tendon(s) of the rotator cuff of right shoulder, initial encounter: Secondary | ICD-10-CM | POA: Diagnosis not present

## 2017-09-07 DIAGNOSIS — H35372 Puckering of macula, left eye: Secondary | ICD-10-CM | POA: Diagnosis not present

## 2017-09-07 DIAGNOSIS — H35342 Macular cyst, hole, or pseudohole, left eye: Secondary | ICD-10-CM | POA: Diagnosis not present

## 2017-09-07 DIAGNOSIS — S43431A Superior glenoid labrum lesion of right shoulder, initial encounter: Secondary | ICD-10-CM | POA: Diagnosis not present

## 2017-09-07 DIAGNOSIS — M75121 Complete rotator cuff tear or rupture of right shoulder, not specified as traumatic: Secondary | ICD-10-CM | POA: Diagnosis not present

## 2017-09-20 DIAGNOSIS — S46011D Strain of muscle(s) and tendon(s) of the rotator cuff of right shoulder, subsequent encounter: Secondary | ICD-10-CM | POA: Diagnosis not present

## 2017-09-21 ENCOUNTER — Encounter: Payer: Self-pay | Admitting: Family Medicine

## 2017-09-21 DIAGNOSIS — Z780 Asymptomatic menopausal state: Secondary | ICD-10-CM | POA: Diagnosis not present

## 2017-09-21 DIAGNOSIS — Z0000 Encounter for general adult medical examination without abnormal findings: Secondary | ICD-10-CM | POA: Diagnosis not present

## 2017-09-21 DIAGNOSIS — E049 Nontoxic goiter, unspecified: Secondary | ICD-10-CM | POA: Diagnosis not present

## 2017-09-21 DIAGNOSIS — I10 Essential (primary) hypertension: Secondary | ICD-10-CM | POA: Diagnosis not present

## 2017-09-21 DIAGNOSIS — M8580 Other specified disorders of bone density and structure, unspecified site: Secondary | ICD-10-CM | POA: Diagnosis not present

## 2017-09-21 DIAGNOSIS — E039 Hypothyroidism, unspecified: Secondary | ICD-10-CM | POA: Diagnosis not present

## 2017-09-21 DIAGNOSIS — E782 Mixed hyperlipidemia: Secondary | ICD-10-CM | POA: Diagnosis not present

## 2017-09-22 ENCOUNTER — Other Ambulatory Visit: Payer: Self-pay | Admitting: Family Medicine

## 2017-09-22 DIAGNOSIS — R591 Generalized enlarged lymph nodes: Secondary | ICD-10-CM

## 2017-10-18 DIAGNOSIS — S46011D Strain of muscle(s) and tendon(s) of the rotator cuff of right shoulder, subsequent encounter: Secondary | ICD-10-CM | POA: Diagnosis not present

## 2017-11-02 ENCOUNTER — Ambulatory Visit: Payer: Medicare Other | Attending: Orthopedic Surgery | Admitting: Physical Therapy

## 2017-11-02 ENCOUNTER — Other Ambulatory Visit: Payer: Self-pay

## 2017-11-02 DIAGNOSIS — M25611 Stiffness of right shoulder, not elsewhere classified: Secondary | ICD-10-CM | POA: Diagnosis not present

## 2017-11-02 DIAGNOSIS — R1310 Dysphagia, unspecified: Secondary | ICD-10-CM | POA: Diagnosis not present

## 2017-11-02 DIAGNOSIS — M25511 Pain in right shoulder: Secondary | ICD-10-CM | POA: Diagnosis not present

## 2017-11-02 DIAGNOSIS — M6281 Muscle weakness (generalized): Secondary | ICD-10-CM

## 2017-11-02 NOTE — Therapy (Signed)
[address] [place], [place], [address] Phone: [phone]   Fax:  [phone]  Physical Therapy Evaluation  [name] MRN: [id number] Date of Birth: 1940-10-27 Referring Provider: Marchia Bond, MD   Encounter Date: 11/02/2017  PT End of Session - 11/02/17 1307    Visit Number  1    Number of Visits  24    Date for PT Re-Evaluation  01/01/18    Authorization Type  KX after visit 15; Progress note at 10th    PT Start Time  1300    PT Stop Time  1343    PT Time Calculation (min)  43 min    Activity Tolerance  Patient tolerated treatment well    Behavior During Therapy  [facility] for tasks assessed/performed       Past Medical History:  Diagnosis Date  . GERD (gastroesophageal reflux disease)   . Hypertension   . Seasonal allergies   . Thyroid disease   . Urgency incontinence     Past Surgical History:  Procedure Laterality Date  . ABDOMINAL HYSTERECTOMY    . CESAREAN SECTION    . CYSTOCELE REPAIR      There were no vitals filed for this visit.   Subjective Assessment - 11/02/17 1305    Subjective  Patient presents s/p R RCR oon 09/07/17. She wears her sling only when going out. She reports pain only at end range movements. She also reports low back pain with sciatica which has returned since surgery.     Pertinent History  HTN, hypothyroidism, high cholesterol, RCR R 09/07/17    Patient Stated Goals  to be able to use her right arm; do her hair    Currently in Pain?  No/denies         [facility] PT Assessment - 11/02/17 0001      Assessment   Medical Diagnosis  s/p R RCR    Referring Provider  Marchia Bond, MD    Onset Date/Surgical Date  09/07/17    Hand Dominance  Right    Next MD Visit  11/09/17      Balance Screen   Has the patient fallen in the past 6 months  No    Has the patient had a decrease in activity level because of a fear of falling?   No    Is the  patient reluctant to leave their home because of a fear of falling?   No      Home Film/video editor residence    Living Arrangements  Spouse/significant other    Type of [place]      Prior Function   Level of [place] with basic ADLs      Observation/Other Assessments   Focus on Therapeutic Outcomes (FOTO)   66% limited      Posture/Postural Control   Posture/Postural Control  Postural limitations    Postural Limitations  Forward head    Posture Comments  depressed R shoulder, even pelvic landmarks      ROM / Strength   AROM / PROM / Strength  PROM;AROM;Strength      AROM   Overall AROM Comments  R elbow WNL    AROM Assessment Site  Shoulder    Right/Left Shoulder  Right    Right Shoulder Flexion  115 Degrees    Right Shoulder ABduction  75 Degrees    Right  Shoulder Internal Rotation  63 Degrees at 45 deg abd    Right Shoulder External Rotation  10 Degrees at 45 deg ABD      PROM   PROM Assessment Site  Shoulder    Right/Left Shoulder  Right    Right Shoulder Flexion  144 Degrees    Right Shoulder ABduction  85 Degrees    Right Shoulder Internal Rotation  68 Degrees  at 45 deg ABD    Right Shoulder External Rotation  19 Degrees at 45 deg ABD      Palpation   Palpation comment  unremarkable                Objective measurements completed on examination: See above findings.              PT Education - 11/02/17 1544    Education provided  Yes    Education Details  HEP; shoulder precautions    Person(s) Educated  Patient    Methods  Explanation;Demonstration;Verbal cues;Handout    Comprehension  Returned demonstration;Verbalized understanding          PT Long Term Goals - 11/02/17 1550      PT LONG TERM GOAL #1   Title  I with HEP    Time  8    Period  Weeks    Status  New    Target Date  01/01/18      PT LONG TERM GOAL #2   Title  Right shoulder flexioin to 160 degrees or better to  normalize ADLS.    Time  8    Period  Weeks    Status  New      PT LONG TERM GOAL #3   Title  Right shoulder ER to 65 deg or better to allow patient to don/doff clothing.    Time  8    Period  Weeks    Status  New      PT LONG TERM GOAL #4   Title  Patient able to fix her hair at PLOF     Time  8    Period  Weeks    Status  New      PT LONG TERM GOAL #5   Title  Patient able to reach to L4 with R arm to ease dressing.    Time  8    Period  Weeks    Status  New      Additional Long Term Goals   Additional Long Term Goals  Yes      PT LONG TERM GOAL #6   Title  pt to demonstrate 4+/5 or better strength in R shoulder to normalize ADLS    Time  8    Period  Weeks    Status  New      PT LONG TERM GOAL #7   Title  Patient able to perform ADLS with 3/76 pain or less in R shoulder.    Time  8    Period  Weeks    Status  New             Plan - 11/02/17 1546    Clinical Impression Statement  Patient presents for a low complexity evaluation for R RCR performed on 09/07/17. She has decreased ROM, pain with movement and weakness affecting ADLS. She will benefit from PT to address these deficits.    Clinical Presentation  Stable    Clinical Decision Making  Low    Rehab Potential  Excellent  PT Frequency  3x / week    PT Duration  8 weeks    PT Treatment/Interventions  ADLs/Self Care Home Management;Cryotherapy;Electrical Stimulation;Moist Heat;Ultrasound;Neuromuscular re-education;Therapeutic exercise;Patient/family education;Manual techniques;Vasopneumatic Device;Passive range of motion    PT Next Visit Plan  Review HEP, RCR protocol, AAROM; PROM; modalities prn.    PT Home Exercise Plan  supine cane for flex, ER, horizontal ABD, chest press; shrugs and retraction    Consulted and Agree with Plan of Care  Patient       Patient will benefit from skilled therapeutic intervention in order to improve the following deficits and impairments:  Decreased range of motion,  Impaired UE functional use, Pain, Postural dysfunction, Increased edema, Decreased strength  Visit Diagnosis: Stiffness of right shoulder, not elsewhere classified - Plan: PT plan of care cert/re-cert  Acute pain of right shoulder - Plan: PT plan of care cert/re-cert  Muscle weakness (generalized) - Plan: PT plan of care cert/re-cert     Problem List Patient Active Problem List   Diagnosis Date Noted  . Allergic rhinitis 06/23/2015  . Cervical pain 06/23/2015  . Eczema of both external ears 06/23/2015  . Acid reflux 06/23/2015  . Essential (primary) hypertension 06/23/2015  . Gastro-esophageal reflux disease without esophagitis 06/23/2015  . Hypercholesterolemia without hypertriglyceridemia 06/23/2015  . Altered blood in stool 06/23/2015  . Benign essential HTN 06/23/2015  . Hypothyroidism 06/23/2015  . Adult hypothyroidism 06/23/2015  . Iron deficiency anemia 06/23/2015  . LBP (low back pain) 06/23/2015  . Pure hypercholesterolemia 06/23/2015  . Radiculopathy of cervical region 06/23/2015  . Other seasonal allergic rhinitis 06/23/2015  . CN (constipation) 06/23/2015  . Left ankle pain 09/26/2013  . Fracture of fibula, left, closed 11/13/2012  . Nondisplaced fracture of fifth right metatarsal bone 11/13/2012  . Right ankle injury 11/13/2012    Keno Caraway PT 11/02/2017, 4:00 PM  [address] [place], [place], [address] Phone: [phone]   Fax:  [phone]  Name: Shelly Pearson MRN: [id number] Date of Birth: 01/18/1941

## 2017-11-02 NOTE — Patient Instructions (Signed)
For all cane exercises do 10-30 Reps, 4-5 x/day  SHOULDER: External Rotation - Supine (Cane)   Hold cane with both hands. Rotate arm away from body. Keep elbow on floor and next to body. ___ reps per set, ___ sets per day, ___ days per week Add towel to keep elbow at side.  Copyright  VHI. All rights reserved.  Cane Horizontal - Supine   With straight arms holding cane above shoulders, bring cane out to right, center, out to left, and back to above head. Repeat ___ times. Do ___ times per day.  Copyright  VHI. All rights reserved.  Cane Exercise: Flexion   Lie on back, holding cane above chest. Keeping arms as straight as possible, lower cane toward floor beyond head. Hold ____ seconds. Repeat ____ times. Do ____ sessions per day.  http://gt2.exer.us/91   Copyright  VHI. All rights reserved.   Elevation: Shrug (Distal Resist)    Lift shoulders straight up, then return. Maintain same speed up and down. Avoid moving head and neck forward. Repeat ____ times per set. Do ____ sets per session. Do ____ sessions per week. Use __0__ lb weights.  Copyright  VHI. All rights reserved.    Scapular Retraction (Standing)   With arms at sides, pinch shoulder blades together. Repeat 10 times per set. Do 1-3 sets per session. Do 2 sessions per day.   Madelyn Flavors, PT 11/02/17 1:41 PM; [place]-

## 2017-11-06 ENCOUNTER — Ambulatory Visit: Payer: Medicare Other | Admitting: Physical Therapy

## 2017-11-06 ENCOUNTER — Encounter: Payer: Self-pay | Admitting: Physical Therapy

## 2017-11-06 DIAGNOSIS — M25511 Pain in right shoulder: Secondary | ICD-10-CM | POA: Diagnosis not present

## 2017-11-06 DIAGNOSIS — M6281 Muscle weakness (generalized): Secondary | ICD-10-CM

## 2017-11-06 DIAGNOSIS — M25611 Stiffness of right shoulder, not elsewhere classified: Secondary | ICD-10-CM

## 2017-11-06 DIAGNOSIS — R1310 Dysphagia, unspecified: Secondary | ICD-10-CM | POA: Diagnosis not present

## 2017-11-06 NOTE — Therapy (Signed)
[address] [place], [place], [address] Phone: [phone]   Fax:  [phone]  Physical Therapy Treatment  [name] MRN: [id number] Date of Birth: 1940-10-09 Referring Provider: Marchia Bond, MD   Encounter Date: 11/06/2017  PT End of Session - 11/06/17 1059    Visit Number  2    Date for PT Re-Evaluation  01/01/18    PT Start Time  1010    PT Stop Time  1059    PT Time Calculation (min)  49 min    Activity Tolerance  Patient tolerated treatment well    Behavior During Therapy  [facility] for tasks assessed/performed       Past Medical History:  Diagnosis Date  . GERD (gastroesophageal reflux disease)   . Hypertension   . Seasonal allergies   . Thyroid disease   . Urgency incontinence     Past Surgical History:  Procedure Laterality Date  . ABDOMINAL HYSTERECTOMY    . CESAREAN SECTION    . CYSTOCELE REPAIR      There were no vitals filed for this visit.  Subjective Assessment - 11/06/17 1010    Subjective  Patient reports that she is pretty sore and taking acetaminophen, she reports that she likes not having to wear the sling.    Currently in Pain?  Yes    Pain Score  2     Pain Location  Shoulder    Pain Orientation  Right    Pain Descriptors / Indicators  Sore    Pain Type  Acute pain;Surgical pain    Aggravating Factors   end of day, after doing exercises pain up to 6/10    Pain Relieving Factors  sling, rest    Effect of Pain on Daily Activities  difficulkty doing hair and getting dressed                       [facility] Adult PT Treatment/Exercise - 11/06/17 0001      Exercises   Exercises  Shoulder      Shoulder Exercises: Standing   External Rotation  20 reps;Theraband    Theraband Level (Shoulder External Rotation)  Level 1 (Yellow)    Extension  20 reps;Theraband    Theraband Level (Shoulder Extension)  Level 2 (Red)    Retraction   20 reps;Theraband    Theraband Level (Shoulder Retraction)  Level 2 (Red)    Other Standing Exercises  ball on table rolling    Other Standing Exercises  wand exercises with PT overpressure at end ranges all motions, 3# bicep curls      Shoulder Exercises: ROM/Strengthening   UBE (Upper Arm Bike)  level 1 x 6 minutes      Shoulder Exercises: Isometric Strengthening   Flexion  5X10"    Extension  5X10"    External Rotation  5X10"    Internal Rotation  5X10"    ABduction  5X10"    ADduction  5X10"      Manual Therapy   Manual Therapy  Passive ROM    Passive ROM  all right shoulder motions to end range with ossilations to decrease pain             PT Education - 11/06/17 1059    Education provided  Yes    Education Details  reviewed HEP    Person(s) Educated  Patient    Methods  Explanation;Demonstration;Handout;Verbal  cues    Comprehension  Verbalized understanding          PT Long Term Goals - 11/02/17 1550      PT LONG TERM GOAL #1   Title  I with HEP    Time  8    Period  Weeks    Status  New    Target Date  01/01/18      PT LONG TERM GOAL #2   Title  Right shoulder flexioin to 160 degrees or better to normalize ADLS.    Time  8    Period  Weeks    Status  New      PT LONG TERM GOAL #3   Title  Right shoulder ER to 65 deg or better to allow patient to don/doff clothing.    Time  8    Period  Weeks    Status  New      PT LONG TERM GOAL #4   Title  Patient able to fix her hair at PLOF     Time  8    Period  Weeks    Status  New      PT LONG TERM GOAL #5   Title  Patient able to reach to L4 with R arm to ease dressing.    Time  8    Period  Weeks    Status  New      Additional Long Term Goals   Additional Long Term Goals  Yes      PT LONG TERM GOAL #6   Title  pt to demonstrate 4+/5 or better strength in R shoulder to normalize ADLS    Time  8    Period  Weeks    Status  New      PT LONG TERM GOAL #7   Title  Patient able to perform  ADLS with 5/36 pain or less in R shoulder.    Time  8    Period  Weeks    Status  New            Plan - 11/06/17 1059    Clinical Impression Statement  Patient overall doing very well, she is tight into flexion, abduction and ER.  She is progressing as the protocol allows for 8-9 weeks out.    PT Next Visit Plan  continue to progress ROM (AROM, PROM)    Consulted and Agree with Plan of Care  Patient       Patient will benefit from skilled therapeutic intervention in order to improve the following deficits and impairments:  Decreased range of motion, Impaired UE functional use, Pain, Postural dysfunction, Increased edema, Decreased strength  Visit Diagnosis: Stiffness of right shoulder, not elsewhere classified  Acute pain of right shoulder  Muscle weakness (generalized)     Problem List Patient Active Problem List   Diagnosis Date Noted  . Allergic rhinitis 06/23/2015  . Cervical pain 06/23/2015  . Eczema of both external ears 06/23/2015  . Acid reflux 06/23/2015  . Essential (primary) hypertension 06/23/2015  . Gastro-esophageal reflux disease without esophagitis 06/23/2015  . Hypercholesterolemia without hypertriglyceridemia 06/23/2015  . Altered blood in stool 06/23/2015  . Benign essential HTN 06/23/2015  . Hypothyroidism 06/23/2015  . Adult hypothyroidism 06/23/2015  . Iron deficiency anemia 06/23/2015  . LBP (low back pain) 06/23/2015  . Pure hypercholesterolemia 06/23/2015  . Radiculopathy of cervical region 06/23/2015  . Other seasonal allergic rhinitis 06/23/2015  . CN (constipation) 06/23/2015  . Left ankle  pain 09/26/2013  . Fracture of fibula, left, closed 11/13/2012  . Nondisplaced fracture of fifth right metatarsal bone 11/13/2012  . Right ankle injury 11/13/2012    Sumner Boast., PT 11/06/2017, 11:01 AM  [address] [place], [place], [address] Phone: [phone]    Fax:  [phone]  Name: SIGNA CHEEK MRN: [id number] Date of Birth: 05-06-1941

## 2017-11-08 ENCOUNTER — Ambulatory Visit: Payer: Medicare Other | Admitting: Physical Therapy

## 2017-11-08 ENCOUNTER — Encounter: Payer: Self-pay | Admitting: Physical Therapy

## 2017-11-08 DIAGNOSIS — M25611 Stiffness of right shoulder, not elsewhere classified: Secondary | ICD-10-CM | POA: Diagnosis not present

## 2017-11-08 DIAGNOSIS — R1310 Dysphagia, unspecified: Secondary | ICD-10-CM | POA: Diagnosis not present

## 2017-11-08 DIAGNOSIS — M25511 Pain in right shoulder: Secondary | ICD-10-CM

## 2017-11-08 DIAGNOSIS — M6281 Muscle weakness (generalized): Secondary | ICD-10-CM | POA: Diagnosis not present

## 2017-11-08 NOTE — Therapy (Signed)
[address] [place], [place], [address] Phone: [phone]   Fax:  [phone]  Physical Therapy Treatment  [name] MRN: [id number] Date of Birth: 1941-04-02 Referring Provider: Marchia Bond, MD   Encounter Date: 11/08/2017  PT End of Session - 11/08/17 0852    Visit Number  3    Number of Visits  24    Date for PT Re-Evaluation  01/01/18    Authorization Type  KX after visit 15; Progress note at 10th    PT Start Time  0847    PT Stop Time  0942    PT Time Calculation (min)  55 min    Activity Tolerance  Patient tolerated treatment well    Behavior During Therapy  [facility] for tasks assessed/performed       Past Medical History:  Diagnosis Date  . GERD (gastroesophageal reflux disease)   . Hypertension   . Seasonal allergies   . Thyroid disease   . Urgency incontinence     Past Surgical History:  Procedure Laterality Date  . ABDOMINAL HYSTERECTOMY    . CESAREAN SECTION    . CYSTOCELE REPAIR      There were no vitals filed for this visit.  Subjective Assessment - 11/08/17 0849    Subjective  Pt reports she hasn't worn her sling in a few days and feels like she is doing well.     Pertinent History  HTN, hypothyroidism, high cholesterol, RCR R 09/07/17    Patient Stated Goals  to be able to use her right arm; do her hair    Currently in Pain?  No/denies                       [facility] Adult PT Treatment/Exercise - 11/08/17 0001      Exercises   Exercises  Shoulder      Shoulder Exercises: Supine   Flexion  AAROM;Both;10 reps;Limitations    Flexion Limitations  with wand    ABduction  AAROM;Right;10 reps;Limitations    ABduction Limitations  with wand      Shoulder Exercises: Standing   External Rotation  20 reps;Theraband    Theraband Level (Shoulder External Rotation)  Level 1 (Yellow)    Extension  20 reps;Theraband    Theraband Level  (Shoulder Extension)  Level 2 (Red)    Retraction  20 reps;Theraband    Theraband Level (Shoulder Retraction)  Level 2 (Red)    Other Standing Exercises  ball rolling on the wall    Other Standing Exercises  bicep curls      Shoulder Exercises: ROM/Strengthening   UBE (Upper Arm Bike)  level 1 x 6 minutes      Shoulder Exercises: Isometric Strengthening   Flexion  5X10"    Extension  5X10"    External Rotation  5X10"    Internal Rotation  5X10"    ABduction  5X10"    ADduction  5X10"      Modalities   Modalities  Vasopneumatic      Vasopneumatic   Number Minutes Vasopneumatic   15 minutes    Vasopnuematic Location   Shoulder    Vasopneumatic Pressure  Low    Vasopneumatic Temperature   34      Manual Therapy   Manual Therapy  Passive ROM    Passive ROM  all right shoulder motions to end range with ossilations to decrease pain  PT Long Term Goals - 11/08/17 1144      PT LONG TERM GOAL #1   Title  I with HEP    Time  8    Period  Weeks    Status  On-going      PT LONG TERM GOAL #2   Title  Right shoulder flexioin to 160 degrees or better to normalize ADLS.    Time  8    Period  Weeks    Status  New      PT LONG TERM GOAL #3   Title  Right shoulder ER to 65 deg or better to allow patient to don/doff clothing.    Time  8    Status  New      PT LONG TERM GOAL #4   Title  Patient able to fix her hair at PLOF     Period  Weeks    Status  New      PT LONG TERM GOAL #5   Title  Patient able to reach to L4 with R arm to ease dressing.    Time  8    Period  Weeks    Status  New            Plan - 11/08/17 1517    Clinical Impression Statement  Pt tolerating exercises well today. No reports of pain at end of session. Continue skilled PT to progress as protocol allows and toward LTG's.     Rehab Potential  Excellent    PT Frequency  3x / week    PT Duration  8 weeks    PT Treatment/Interventions  ADLs/Self Care Home  Management;Cryotherapy;Electrical Stimulation;Moist Heat;Ultrasound;Neuromuscular re-education;Therapeutic exercise;Patient/family education;Manual techniques;Vasopneumatic Device;Passive range of motion    PT Next Visit Plan  continue to progress ROM (AROM, PROM)    PT Home Exercise Plan  supine cane for flex, ER, horizontal ABD, chest press; shrugs and retraction    Consulted and Agree with Plan of Care  Patient       Patient will benefit from skilled therapeutic intervention in order to improve the following deficits and impairments:  Decreased range of motion, Impaired UE functional use, Pain, Postural dysfunction, Increased edema, Decreased strength  Visit Diagnosis: Stiffness of right shoulder, not elsewhere classified  Acute pain of right shoulder  Muscle weakness (generalized)     Problem List Patient Active Problem List   Diagnosis Date Noted  . Allergic rhinitis 06/23/2015  . Cervical pain 06/23/2015  . Eczema of both external ears 06/23/2015  . Acid reflux 06/23/2015  . Essential (primary) hypertension 06/23/2015  . Gastro-esophageal reflux disease without esophagitis 06/23/2015  . Hypercholesterolemia without hypertriglyceridemia 06/23/2015  . Altered blood in stool 06/23/2015  . Benign essential HTN 06/23/2015  . Hypothyroidism 06/23/2015  . Adult hypothyroidism 06/23/2015  . Iron deficiency anemia 06/23/2015  . LBP (low back pain) 06/23/2015  . Pure hypercholesterolemia 06/23/2015  . Radiculopathy of cervical region 06/23/2015  . Other seasonal allergic rhinitis 06/23/2015  . CN (constipation) 06/23/2015  . Left ankle pain 09/26/2013  . Fracture of fibula, left, closed 11/13/2012  . Nondisplaced fracture of fifth right metatarsal bone 11/13/2012  . Right ankle injury 11/13/2012    Oretha Caprice, MPT 11/08/2017, 11:48 AM  [address] [place], [place], [address] Phone:  [phone]   Fax:  [phone]  Name: Shelly Pearson MRN: [id number] Date of Birth: December 09, 1940

## 2017-11-10 ENCOUNTER — Encounter: Payer: Self-pay | Admitting: Physical Therapy

## 2017-11-10 ENCOUNTER — Ambulatory Visit: Payer: Medicare Other | Admitting: Physical Therapy

## 2017-11-10 DIAGNOSIS — R1310 Dysphagia, unspecified: Secondary | ICD-10-CM | POA: Diagnosis not present

## 2017-11-10 DIAGNOSIS — M25511 Pain in right shoulder: Secondary | ICD-10-CM

## 2017-11-10 DIAGNOSIS — M25611 Stiffness of right shoulder, not elsewhere classified: Secondary | ICD-10-CM | POA: Diagnosis not present

## 2017-11-10 DIAGNOSIS — M6281 Muscle weakness (generalized): Secondary | ICD-10-CM

## 2017-11-10 NOTE — Therapy (Signed)
[name], [place], [address] Phone: [phone]   Fax:  [phone]  Physical Therapy Treatment  [name] MRN: [id number] Date of Birth: 1940/11/29 Referring Provider: Marchia Bond, MD   Encounter Date: 11/10/2017  PT End of Session - 11/10/17 1147    Visit Number  4    Number of Visits  24    Date for PT Re-Evaluation  01/01/18    PT Start Time  5102    PT Stop Time  1115    PT Time Calculation (min)  60 min    Activity Tolerance  Patient tolerated treatment well    Behavior During Therapy  [facility] for tasks assessed/performed       Past Medical History:  Diagnosis Date   GERD (gastroesophageal reflux disease)    Hypertension    Seasonal allergies    Thyroid disease    Urgency incontinence     Past Surgical History:  Procedure Laterality Date   ABDOMINAL HYSTERECTOMY     CESAREAN SECTION     CYSTOCELE REPAIR      There were no vitals filed for this visit.  Subjective Assessment - 11/10/17 1146    Subjective  Reports ongoing difficulty doing her hair d/t ongoing decreased ROM and pain/ weakness with flexion                       [facility] Adult PT Treatment/Exercise - 11/10/17 0001      Shoulder Exercises: Supine   Flexion  AAROM;Both;10 reps;Limitations    Flexion Limitations  with wand    ABduction  AAROM;Right;10 reps;Limitations    ABduction Limitations  with wand    Other Supine Exercises  arom flex, er, abdu 3x10ea      Shoulder Exercises: Stretch   Wall Stretch - Flexion  5 reps;10 seconds      Modalities   Modalities  Vasopneumatic      Vasopneumatic   Number Minutes Vasopneumatic   15 minutes    Vasopnuematic Location   Shoulder    Vasopneumatic Pressure  Low    Vasopneumatic Temperature   34      Manual Therapy   Manual Therapy  Passive ROM    Passive ROM  all right shoulder motions to end range with  ossilations to decrease pain                  PT Long Term Goals - 11/08/17 1144      PT LONG TERM GOAL #1   Title  I with HEP    Time  8    Period  Weeks    Status  On-going      PT LONG TERM GOAL #2   Title  Right shoulder flexioin to 160 degrees or better to normalize ADLS.    Time  8    Period  Weeks    Status  New      PT LONG TERM GOAL #3   Title  Right shoulder ER to 65 deg or better to allow patient to don/doff clothing.    Time  8    Status  New      PT LONG TERM GOAL #4   Title  Patient able to fix her hair at PLOF     Period  Weeks    Status  New      PT LONG TERM GOAL #5  Title  Patient able to reach to L4 with R arm to ease dressing.    Time  8    Period  Weeks    Status  New            Plan - 11/10/17 1148    Clinical Impression Statement  Cont to lack ROM throughout all planes with decreased joint mobility and soft tissue flexibility.  Increased ROM with manual techniques and PROM.  Did well with mat based AROM exercises with emphasis on avoiding substitution patterns.         Patient will benefit from skilled therapeutic intervention in order to improve the following deficits and impairments:     Visit Diagnosis: Stiffness of right shoulder, not elsewhere classified  Acute pain of right shoulder  Muscle weakness (generalized)     Problem List Patient Active Problem List   Diagnosis Date Noted   Allergic rhinitis 06/23/2015   Cervical pain 06/23/2015   Eczema of both external ears 06/23/2015   Acid reflux 06/23/2015   Essential (primary) hypertension 06/23/2015   Gastro-esophageal reflux disease without esophagitis 06/23/2015   Hypercholesterolemia without hypertriglyceridemia 06/23/2015   Altered blood in stool 06/23/2015   Benign essential HTN 06/23/2015   Hypothyroidism 06/23/2015   Adult hypothyroidism 06/23/2015   Iron deficiency anemia 06/23/2015   LBP (low back pain) 06/23/2015   Pure  hypercholesterolemia 06/23/2015   Radiculopathy of cervical region 06/23/2015   Other seasonal allergic rhinitis 06/23/2015   CN (constipation) 06/23/2015   Left ankle pain 09/26/2013   Fracture of fibula, left, closed 11/13/2012   Nondisplaced fracture of fifth right metatarsal bone 11/13/2012   Right ankle injury 11/13/2012    Shelly Pearson, PTA 11/10/2017, 11:51 AM  [address] [place], [place], [address] Phone: [phone]   Fax:  [phone]  Name: Shelly Pearson MRN: [id number] Date of Birth: 07/13/1940

## 2017-11-13 ENCOUNTER — Encounter: Payer: Self-pay | Admitting: Physical Therapy

## 2017-11-13 ENCOUNTER — Ambulatory Visit: Payer: Medicare Other | Admitting: Physical Therapy

## 2017-11-13 DIAGNOSIS — M6281 Muscle weakness (generalized): Secondary | ICD-10-CM | POA: Diagnosis not present

## 2017-11-13 DIAGNOSIS — M25511 Pain in right shoulder: Secondary | ICD-10-CM | POA: Diagnosis not present

## 2017-11-13 DIAGNOSIS — M25611 Stiffness of right shoulder, not elsewhere classified: Secondary | ICD-10-CM | POA: Diagnosis not present

## 2017-11-13 DIAGNOSIS — R1310 Dysphagia, unspecified: Secondary | ICD-10-CM | POA: Diagnosis not present

## 2017-11-13 NOTE — Therapy (Signed)
[address] [place], [place], [address] Phone: [phone]   Fax:  [phone]  Physical Therapy Treatment  [name] MRN: [id number] Date of Birth: 18-May-1941 Referring Provider: Marchia Bond, MD   Encounter Date: 11/13/2017  PT End of Session - 11/13/17 1143    Visit Number  5    Number of Visits  24    Date for PT Re-Evaluation  01/01/18    Authorization Type  KX after visit 15; Progress note at 10th    PT Start Time  1100    PT Stop Time  1156    PT Time Calculation (min)  56 min    Activity Tolerance  Patient tolerated treatment well    Behavior During Therapy  [facility] for tasks assessed/performed       Past Medical History:  Diagnosis Date  . GERD (gastroesophageal reflux disease)   . Hypertension   . Seasonal allergies   . Thyroid disease   . Urgency incontinence     Past Surgical History:  Procedure Laterality Date  . ABDOMINAL HYSTERECTOMY    . CESAREAN SECTION    . CYSTOCELE REPAIR      There were no vitals filed for this visit.  Subjective Assessment - 11/13/17 1104    Subjective  "No pain only soreness"    Currently in Pain?  No/denies    Pain Score  0-No pain         [facility] PT Assessment - 11/13/17 0001      AROM   Right Shoulder Flexion  155 Degrees    Right Shoulder ABduction  132 Degrees    Right Shoulder Internal Rotation  70 Degrees    Right Shoulder External Rotation  36 Degrees      PROM   PROM Assessment Site  --    Right/Left Shoulder  --                   [facility] Adult PT Treatment/Exercise - 11/13/17 0001      Shoulder Exercises: Standing   External Rotation  20 reps;Theraband    Theraband Level (Shoulder External Rotation)  Level 1 (Yellow)    Internal Rotation  20 reps;Theraband;Right    Theraband Level (Shoulder Internal Rotation)  Level 2 (Red)    Flexion  --    Extension  20 reps;Theraband    Theraband  Level (Shoulder Extension)  Level 2 (Red)    Retraction  20 reps;Theraband    Theraband Level (Shoulder Retraction)  Level 2 (Red)    Other Standing Exercises  Flex up wall with pillow case 1lb 2x10     Other Standing Exercises  Flex, ext, IR all AAROM with cane x10       Shoulder Exercises: ROM/Strengthening   UBE (Upper Arm Bike)  level 1 x 6 minutes      Shoulder Exercises: Isometric Strengthening   External Rotation  5X5"    Internal Rotation  5X5"      Modalities   Modalities  Vasopneumatic      Vasopneumatic   Number Minutes Vasopneumatic   15 minutes    Vasopnuematic Location   Shoulder    Vasopneumatic Pressure  Low    Vasopneumatic Temperature   34      Manual Therapy   Manual Therapy  Passive ROM    Passive ROM  all right shoulder motions to end range with ossilations to decrease pain  PT Long Term Goals - 11/13/17 1143      PT LONG TERM GOAL #1   Title  I with HEP    Status  Achieved      PT LONG TERM GOAL #2   Title  Right shoulder flexioin to 160 degrees or better to normalize ADLS.    Status  Partially Met      PT LONG TERM GOAL #3   Title  Right shoulder ER to 65 deg or better to allow patient to don/doff clothing.    Status  On-going            Plan - 11/13/17 1145    Clinical Impression Statement  Pt has progressed meeting some LTG's.  R shoulder is limited actively but is good with passive motion. Good effort with all exercises. No reports of increase pain. Pt reports increase functional mobility at home.     Rehab Potential  Excellent    PT Frequency  3x / week    PT Duration  8 weeks    PT Treatment/Interventions  ADLs/Self Care Home Management;Cryotherapy;Electrical Stimulation;Moist Heat;Ultrasound;Neuromuscular re-education;Therapeutic exercise;Patient/family education;Manual techniques;Vasopneumatic Device;Passive range of motion    PT Next Visit Plan  continue to progress ROM (AROM, PROM)       Patient will  benefit from skilled therapeutic intervention in order to improve the following deficits and impairments:  Decreased range of motion, Impaired UE functional use, Pain, Postural dysfunction, Increased edema, Decreased strength  Visit Diagnosis: Stiffness of right shoulder, not elsewhere classified  Muscle weakness (generalized)  Acute pain of right shoulder     Problem List Patient Active Problem List   Diagnosis Date Noted  . Allergic rhinitis 06/23/2015  . Cervical pain 06/23/2015  . Eczema of both external ears 06/23/2015  . Acid reflux 06/23/2015  . Essential (primary) hypertension 06/23/2015  . Gastro-esophageal reflux disease without esophagitis 06/23/2015  . Hypercholesterolemia without hypertriglyceridemia 06/23/2015  . Altered blood in stool 06/23/2015  . Benign essential HTN 06/23/2015  . Hypothyroidism 06/23/2015  . Adult hypothyroidism 06/23/2015  . Iron deficiency anemia 06/23/2015  . LBP (low back pain) 06/23/2015  . Pure hypercholesterolemia 06/23/2015  . Radiculopathy of cervical region 06/23/2015  . Other seasonal allergic rhinitis 06/23/2015  . CN (constipation) 06/23/2015  . Left ankle pain 09/26/2013  . Fracture of fibula, left, closed 11/13/2012  . Nondisplaced fracture of fifth right metatarsal bone 11/13/2012  . Right ankle injury 11/13/2012    Scot Jun 11/13/2017, 11:50 AM  [address] [place], [place], [address] Phone: [phone]   Fax:  [phone]  Name: Shelly Pearson MRN: [id number] Date of Birth: 02/25/1941

## 2017-11-15 ENCOUNTER — Encounter: Payer: Self-pay | Admitting: Physical Therapy

## 2017-11-15 ENCOUNTER — Ambulatory Visit: Payer: Medicare Other | Admitting: Physical Therapy

## 2017-11-15 DIAGNOSIS — M25611 Stiffness of right shoulder, not elsewhere classified: Secondary | ICD-10-CM

## 2017-11-15 DIAGNOSIS — M25511 Pain in right shoulder: Secondary | ICD-10-CM | POA: Diagnosis not present

## 2017-11-15 DIAGNOSIS — M6281 Muscle weakness (generalized): Secondary | ICD-10-CM

## 2017-11-15 DIAGNOSIS — R1310 Dysphagia, unspecified: Secondary | ICD-10-CM | POA: Diagnosis not present

## 2017-11-15 DIAGNOSIS — S46011D Strain of muscle(s) and tendon(s) of the rotator cuff of right shoulder, subsequent encounter: Secondary | ICD-10-CM | POA: Diagnosis not present

## 2017-11-15 NOTE — Therapy (Signed)
[address] [place], [place], [address] Phone: [phone]   Fax:  [phone]  Physical Therapy Treatment  [name] MRN: [id number] Date of Birth: Apr 14, 1941 Referring Provider: Marchia Bond, MD   Encounter Date: 11/15/2017  PT End of Session - 11/15/17 1529    Visit Number  6    Date for PT Re-Evaluation  01/01/18    PT Start Time  2458    PT Stop Time  0998    PT Time Calculation (min)  59 min    Activity Tolerance  Patient tolerated treatment well    Behavior During Therapy  [facility] for tasks assessed/performed       Past Medical History:  Diagnosis Date  . GERD (gastroesophageal reflux disease)   . Hypertension   . Seasonal allergies   . Thyroid disease   . Urgency incontinence     Past Surgical History:  Procedure Laterality Date  . ABDOMINAL HYSTERECTOMY    . CESAREAN SECTION    . CYSTOCELE REPAIR      There were no vitals filed for this visit.  Subjective Assessment - 11/15/17 1447    Subjective  Saw MD, he was pleased, reports some pain inthe right upper arm anterior and laterally.    Currently in Pain?  Yes    Pain Score  1     Pain Location  Shoulder    Pain Orientation  Right    Pain Descriptors / Indicators  Sore    Aggravating Factors   end of day, sore                       [facility] Adult PT Treatment/Exercise - 11/15/17 0001      Shoulder Exercises: Supine   Flexion  20 reps;Right    Flexion Limitations  with 1# from 80 degrees to 130 degrees    Other Supine Exercises  1# ER/IR with elbow abducted to 70 degrees    Other Supine Exercises  2# punches with isometric circles      Shoulder Exercises: Standing   External Rotation  20 reps;Theraband    Theraband Level (Shoulder External Rotation)  Level 2 (Red)    Internal Rotation  20 reps;Theraband;Right    Theraband Level (Shoulder Internal Rotation)  Level 2 (Red)    Extension  20 reps;Theraband    Theraband Level (Shoulder Extension)  Level 2 (Red)    Retraction  20 reps;Theraband    Theraband Level (Shoulder Retraction)  Level 2 (Red)    Other Standing Exercises  Flex up wall with pillow case 1lb 2x10     Other Standing Exercises  weighted ball back to wall reaching both hands up above head      Shoulder Exercises: ROM/Strengthening   UBE (Upper Arm Bike)  level 4 x 6 minutes    Other ROM/Strengthening Exercises  doorway stretch,  star gazer stretchg    Other ROM/Strengthening Exercises  biceps 5#, triceps 20# both 2x10, AAROM all motions      Modalities   Modalities  Vasopneumatic      Vasopneumatic   Number Minutes Vasopneumatic   15 minutes    Vasopnuematic Location   Shoulder    Vasopneumatic Pressure  Low    Vasopneumatic Temperature   38      Manual Therapy   Manual Therapy  Passive ROM    Passive ROM  all right shoulder motions to  end range with ossilations to decrease pain, focused today on ER                  PT Long Term Goals - 11/13/17 1143      PT LONG TERM GOAL #1   Title  I with HEP    Status  Achieved      PT LONG TERM GOAL #2   Title  Right shoulder flexioin to 160 degrees or better to normalize ADLS.    Status  Partially Met      PT LONG TERM GOAL #3   Title  Right shoulder ER to 65 deg or better to allow patient to don/doff clothing.    Status  On-going            Plan - 11/15/17 1530    Clinical Impression Statement  Doing very well has tightness int he anterior shoulder especially with ER.  Has some pain in the right upper arm, needs cues for posture and to go slow    PT Next Visit Plan  AROM, PRE's slowly    Consulted and Agree with Plan of Care  Patient       Patient will benefit from skilled therapeutic intervention in order to improve the following deficits and impairments:  Decreased range of motion, Impaired UE functional use, Pain, Postural dysfunction, Increased edema, Decreased  strength  Visit Diagnosis: Stiffness of right shoulder, not elsewhere classified  Muscle weakness (generalized)  Acute pain of right shoulder     Problem List Patient Active Problem List   Diagnosis Date Noted  . Allergic rhinitis 06/23/2015  . Cervical pain 06/23/2015  . Eczema of both external ears 06/23/2015  . Acid reflux 06/23/2015  . Essential (primary) hypertension 06/23/2015  . Gastro-esophageal reflux disease without esophagitis 06/23/2015  . Hypercholesterolemia without hypertriglyceridemia 06/23/2015  . Altered blood in stool 06/23/2015  . Benign essential HTN 06/23/2015  . Hypothyroidism 06/23/2015  . Adult hypothyroidism 06/23/2015  . Iron deficiency anemia 06/23/2015  . LBP (low back pain) 06/23/2015  . Pure hypercholesterolemia 06/23/2015  . Radiculopathy of cervical region 06/23/2015  . Other seasonal allergic rhinitis 06/23/2015  . CN (constipation) 06/23/2015  . Left ankle pain 09/26/2013  . Fracture of fibula, left, closed 11/13/2012  . Nondisplaced fracture of fifth right metatarsal bone 11/13/2012  . Right ankle injury 11/13/2012    Sumner Boast., PT 11/15/2017, 3:31 PM  [address] [place], [place], [address] Phone: [phone]   Fax:  [phone]  Name: Shelly Pearson MRN: [id number] Date of Birth: Mar 24, 1941

## 2017-11-17 ENCOUNTER — Encounter: Payer: Self-pay | Admitting: Physical Therapy

## 2017-11-17 ENCOUNTER — Ambulatory Visit: Payer: Medicare Other | Admitting: Physical Therapy

## 2017-11-17 DIAGNOSIS — R1310 Dysphagia, unspecified: Secondary | ICD-10-CM | POA: Diagnosis not present

## 2017-11-17 DIAGNOSIS — M6281 Muscle weakness (generalized): Secondary | ICD-10-CM

## 2017-11-17 DIAGNOSIS — M25611 Stiffness of right shoulder, not elsewhere classified: Secondary | ICD-10-CM

## 2017-11-17 DIAGNOSIS — M25511 Pain in right shoulder: Secondary | ICD-10-CM | POA: Diagnosis not present

## 2017-11-17 NOTE — Therapy (Signed)
[address] [place], [place], [address] Phone: [phone]   Fax:  [phone]  Physical Therapy Treatment  [name] MRN: [id number] Date of Birth: 12/06/1940 Referring Provider: Marchia Bond, MD   Encounter Date: 11/17/2017  PT End of Session - 11/17/17 1056    Visit Number  7    Number of Visits  24    Date for PT Re-Evaluation  01/01/18    Authorization Type  KX after visit 15; Progress note at 10th    PT Start Time  1015    PT Stop Time  1110    PT Time Calculation (min)  55 min    Activity Tolerance  Patient tolerated treatment well    Behavior During Therapy  [facility] for tasks assessed/performed       Past Medical History:  Diagnosis Date  . GERD (gastroesophageal reflux disease)   . Hypertension   . Seasonal allergies   . Thyroid disease   . Urgency incontinence     Past Surgical History:  Procedure Laterality Date  . ABDOMINAL HYSTERECTOMY    . CESAREAN SECTION    . CYSTOCELE REPAIR      There were no vitals filed for this visit.  Subjective Assessment - 11/17/17 1017    Subjective  "It is going pretty good"    Currently in Pain?  No/denies    Pain Score  0-No pain                       [facility] Adult PT Treatment/Exercise - 11/17/17 0001      Shoulder Exercises: Supine   Flexion  20 reps;Right    Flexion Limitations  with 2# from 0 to 90    Other Supine Exercises  1# ER/IR with elbow abducted to 70 degrees    Other Supine Exercises  2# punches with isometric circles      Shoulder Exercises: Standing   External Rotation  20 reps;Theraband    Theraband Level (Shoulder External Rotation)  Level 2 (Red)    Internal Rotation  20 reps;Theraband;Right    Theraband Level (Shoulder Internal Rotation)  Level 2 (Red)    Extension  20 reps;Theraband    Theraband Level (Shoulder Extension)  Level 2 (Red)    Other Standing Exercises  Flex up  wall with pillow case 1lb 2x10       Shoulder Exercises: ROM/Strengthening   UBE (Upper Arm Bike)  level 4 x 6 minutes      Modalities   Modalities  Vasopneumatic      Vasopneumatic   Number Minutes Vasopneumatic   15 minutes    Vasopnuematic Location   Shoulder    Vasopneumatic Pressure  Low    Vasopneumatic Temperature   38      Manual Therapy   Manual Therapy  Passive ROM    Passive ROM  all right shoulder motions to end range with ossilations to decrease pain, focused today on ER                  PT Long Term Goals - 11/13/17 1143      PT LONG TERM GOAL #1   Title  I with HEP    Status  Achieved      PT LONG TERM GOAL #2   Title  Right shoulder flexioin to 160 degrees or better to normalize ADLS.    Status  Partially Met      PT LONG TERM GOAL #3   Title  Right shoulder ER to 65 deg or better to allow patient to don/doff clothing.    Status  On-going            Plan - 11/17/17 1056    Clinical Impression Statement  Pt continues to do very well in therapy, she continues to need cues for posture and pacing. She does demo some tightness with external rotation noted during MT.      Rehab Potential  Excellent    PT Frequency  3x / week    PT Duration  8 weeks    PT Treatment/Interventions  ADLs/Self Care Home Management;Cryotherapy;Electrical Stimulation;Moist Heat;Ultrasound;Neuromuscular re-education;Therapeutic exercise;Patient/family education;Manual techniques;Vasopneumatic Device;Passive range of motion    PT Next Visit Plan  AROM, PRE's slowly       Patient will benefit from skilled therapeutic intervention in order to improve the following deficits and impairments:  Decreased range of motion, Impaired UE functional use, Pain, Postural dysfunction, Increased edema, Decreased strength  Visit Diagnosis: Stiffness of right shoulder, not elsewhere classified  Muscle weakness (generalized)  Acute pain of right shoulder  Dysphagia, unspecified  type     Problem List Patient Active Problem List   Diagnosis Date Noted  . Allergic rhinitis 06/23/2015  . Cervical pain 06/23/2015  . Eczema of both external ears 06/23/2015  . Acid reflux 06/23/2015  . Essential (primary) hypertension 06/23/2015  . Gastro-esophageal reflux disease without esophagitis 06/23/2015  . Hypercholesterolemia without hypertriglyceridemia 06/23/2015  . Altered blood in stool 06/23/2015  . Benign essential HTN 06/23/2015  . Hypothyroidism 06/23/2015  . Adult hypothyroidism 06/23/2015  . Iron deficiency anemia 06/23/2015  . LBP (low back pain) 06/23/2015  . Pure hypercholesterolemia 06/23/2015  . Radiculopathy of cervical region 06/23/2015  . Other seasonal allergic rhinitis 06/23/2015  . CN (constipation) 06/23/2015  . Left ankle pain 09/26/2013  . Fracture of fibula, left, closed 11/13/2012  . Nondisplaced fracture of fifth right metatarsal bone 11/13/2012  . Right ankle injury 11/13/2012    Scot Jun, PTA 11/17/2017, 10:59 AM  [address] [place], [place], [address] Phone: [phone]   Fax:  [phone]  Name: NAZIYAH TIESZEN MRN: [id number] Date of Birth: Oct 05, 1940

## 2017-11-20 ENCOUNTER — Ambulatory Visit: Payer: Medicare Other | Admitting: Physical Therapy

## 2017-11-20 ENCOUNTER — Encounter: Payer: Self-pay | Admitting: Physical Therapy

## 2017-11-20 DIAGNOSIS — R1310 Dysphagia, unspecified: Secondary | ICD-10-CM | POA: Diagnosis not present

## 2017-11-20 DIAGNOSIS — M25611 Stiffness of right shoulder, not elsewhere classified: Secondary | ICD-10-CM | POA: Diagnosis not present

## 2017-11-20 DIAGNOSIS — M6281 Muscle weakness (generalized): Secondary | ICD-10-CM

## 2017-11-20 DIAGNOSIS — M25511 Pain in right shoulder: Secondary | ICD-10-CM

## 2017-11-20 NOTE — Therapy (Signed)
[address] [place], [place], [address] Phone: [phone]   Fax:  [phone]  Physical Therapy Treatment  [name] MRN: [id number] Date of Birth: 25-May-1941 Referring Provider: Marchia Bond, MD   Encounter Date: 11/20/2017  PT End of Session - 11/20/17 1054    Visit Number  8    Number of Visits  24    Date for PT Re-Evaluation  01/01/18    PT Start Time  1013    PT Stop Time  1109    PT Time Calculation (min)  56 min    Activity Tolerance  Patient tolerated treatment well    Behavior During Therapy  [facility] for tasks assessed/performed       Past Medical History:  Diagnosis Date  . GERD (gastroesophageal reflux disease)   . Hypertension   . Seasonal allergies   . Thyroid disease   . Urgency incontinence     Past Surgical History:  Procedure Laterality Date  . ABDOMINAL HYSTERECTOMY    . CESAREAN SECTION    . CYSTOCELE REPAIR      There were no vitals filed for this visit.  Subjective Assessment - 11/20/17 1014    Subjective  "Achy on Friday and Saturday, I have been doing my exercises"    Currently in Pain?  No/denies    Pain Score  0-No pain                       [facility] Adult PT Treatment/Exercise - 11/20/17 0001      Shoulder Exercises: Supine   Flexion  20 reps;Right    Flexion Limitations  with 1# from 80 degrees to 130 degrees    Other Supine Exercises  1# ER/IR with elbow abducted to 70 degrees      Shoulder Exercises: Standing   External Rotation  20 reps;Theraband    Theraband Level (Shoulder External Rotation)  Level 2 (Red)    Internal Rotation  20 reps;Theraband;Right    Theraband Level (Shoulder Internal Rotation)  Level 2 (Red)    Flexion  Weights;Both;20 reps    Shoulder Flexion Weight (lbs)  1    ABduction  AROM;10 reps;Both    Extension  20 reps;Theraband    Theraband Level (Shoulder Extension)  Level 2 (Red)    Row   Theraband;20 reps;Both    Theraband Level (Shoulder Row)  Level 2 (Red)    Other Standing Exercises  Flex up wall with pillow case 1lb 2x10     Other Standing Exercises  Wall circles CW, CCW 2x10 each       Shoulder Exercises: ROM/Strengthening   UBE (Upper Arm Bike)  level 4 x 6 minutes      Shoulder Exercises: Power Control and instrumentation engineer & Lats 15lb 2x10       Modalities   Modalities  Vasopneumatic      Vasopneumatic   Number Minutes Vasopneumatic   15 minutes    Vasopnuematic Location   Shoulder    Vasopneumatic Pressure  Low    Vasopneumatic Temperature   38      Manual Therapy   Manual Therapy  Passive ROM    Passive ROM  all right shoulder motions to end range with ossilations to decrease pain, focused today on ER                  PT  Long Term Goals - 11/13/17 1143      PT LONG TERM GOAL #1   Title  I with HEP    Status  Achieved      PT LONG TERM GOAL #2   Title  Right shoulder flexioin to 160 degrees or better to normalize ADLS.    Status  Partially Met      PT LONG TERM GOAL #3   Title  Right shoulder ER to 65 deg or better to allow patient to don/doff clothing.    Status  On-going            Plan - 11/20/17 1055    Clinical Impression Statement  Pt had no issues with today's interventions. She is progressing towards all goals. Continues tightness with R shoulder passive ER. She does reports muscle fatigue with wall circles.     Rehab Potential  Excellent    PT Frequency  3x / week    PT Duration  8 weeks    PT Treatment/Interventions  ADLs/Self Care Home Management;Cryotherapy;Electrical Stimulation;Moist Heat;Ultrasound;Neuromuscular re-education;Therapeutic exercise;Patient/family education;Manual techniques;Vasopneumatic Device;Passive range of motion    PT Next Visit Plan  AROM, PRE's slowly       Patient will benefit from skilled therapeutic intervention in order to improve the following deficits and impairments:   Decreased range of motion, Impaired UE functional use, Pain, Postural dysfunction, Increased edema, Decreased strength  Visit Diagnosis: Muscle weakness (generalized)  Acute pain of right shoulder  Stiffness of right shoulder, not elsewhere classified     Problem List Patient Active Problem List   Diagnosis Date Noted  . Allergic rhinitis 06/23/2015  . Cervical pain 06/23/2015  . Eczema of both external ears 06/23/2015  . Acid reflux 06/23/2015  . Essential (primary) hypertension 06/23/2015  . Gastro-esophageal reflux disease without esophagitis 06/23/2015  . Hypercholesterolemia without hypertriglyceridemia 06/23/2015  . Altered blood in stool 06/23/2015  . Benign essential HTN 06/23/2015  . Hypothyroidism 06/23/2015  . Adult hypothyroidism 06/23/2015  . Iron deficiency anemia 06/23/2015  . LBP (low back pain) 06/23/2015  . Pure hypercholesterolemia 06/23/2015  . Radiculopathy of cervical region 06/23/2015  . Other seasonal allergic rhinitis 06/23/2015  . CN (constipation) 06/23/2015  . Left ankle pain 09/26/2013  . Fracture of fibula, left, closed 11/13/2012  . Nondisplaced fracture of fifth right metatarsal bone 11/13/2012  . Right ankle injury 11/13/2012    Scot Jun, PTA 11/20/2017, 10:56 AM  [address] [place], [place], [address] Phone: [phone]   Fax:  [phone]  Name: Shelly Pearson MRN: [id number] Date of Birth: 1941/01/23

## 2017-11-22 ENCOUNTER — Encounter: Payer: Self-pay | Admitting: Physical Therapy

## 2017-11-22 ENCOUNTER — Ambulatory Visit: Payer: Medicare Other | Admitting: Physical Therapy

## 2017-11-22 DIAGNOSIS — M25511 Pain in right shoulder: Secondary | ICD-10-CM

## 2017-11-22 DIAGNOSIS — M25611 Stiffness of right shoulder, not elsewhere classified: Secondary | ICD-10-CM | POA: Diagnosis not present

## 2017-11-22 DIAGNOSIS — R1310 Dysphagia, unspecified: Secondary | ICD-10-CM | POA: Diagnosis not present

## 2017-11-22 DIAGNOSIS — M6281 Muscle weakness (generalized): Secondary | ICD-10-CM | POA: Diagnosis not present

## 2017-11-22 NOTE — Therapy (Signed)
[address] [place], [place], [address] Phone: [phone]   Fax:  [phone]  Physical Therapy Treatment  [name] MRN: [id number] Date of Birth: [birth year] Referring Provider: Marchia Bond, MD   Encounter Date: 11/22/2017  PT End of Session - 11/22/17 1103    Visit Number  9    Date for PT Re-Evaluation  01/01/18    PT Start Time  9728    PT Stop Time  1112    PT Time Calculation (min)  57 min    Activity Tolerance  Patient tolerated treatment well    Behavior During Therapy  [facility] for tasks assessed/performed       Past Medical History:  Diagnosis Date  . GERD (gastroesophageal reflux disease)   . Hypertension   . Seasonal allergies   . Thyroid disease   . Urgency incontinence     Past Surgical History:  Procedure Laterality Date  . ABDOMINAL HYSTERECTOMY    . CESAREAN SECTION    . CYSTOCELE REPAIR      There were no vitals filed for this visit.  Subjective Assessment - 11/22/17 1016    Subjective  "Im feeling good, It was uncomfortable yesterday after I exercised"     Currently in Pain?  No/denies    Pain Score  0-No pain         [facility] PT Assessment - 11/22/17 0001      AROM   Right Shoulder Flexion  161 Degrees    Right Shoulder ABduction  151 Degrees    Right Shoulder Internal Rotation  62 Degrees    Right Shoulder External Rotation  55 Degrees                   [facility] Adult PT Treatment/Exercise - 11/22/17 0001      Shoulder Exercises: Supine   Other Supine Exercises  1# ER/IR with elbow abducted to 70 degrees      Shoulder Exercises: Standing   External Rotation  20 reps;Theraband    Theraband Level (Shoulder External Rotation)  Level 1 (Yellow)    Internal Rotation  20 reps;Theraband;Right    Theraband Level (Shoulder Internal Rotation)  Level 1 (Yellow)    Flexion  Weights;Both;20 reps    Shoulder Flexion Weight (lbs)  1     ABduction  AROM;10 reps;Both    Extension  20 reps;Theraband    Theraband Level (Shoulder Extension)  Level 2 (Red)    Row  Theraband;20 reps;Both    Theraband Level (Shoulder Row)  Level 3 (Green)    Other Standing Exercises  Flex up wall with pillow case 1lb 2x10     Other Standing Exercises  3 level cabinet reaches  RUE flex 1lb and abd x10 each       Shoulder Exercises: ROM/Strengthening   UBE (Upper Arm Bike)  level 3.5 x 6 minutes      Shoulder Exercises: Power Control and instrumentation engineer & Lats 15lb 2x10       Modalities   Modalities  Vasopneumatic      Vasopneumatic   Number Minutes Vasopneumatic   15 minutes    Vasopnuematic Location   Shoulder    Vasopneumatic Pressure  Low    Vasopneumatic Temperature   38      Manual Therapy   Manual Therapy  Passive ROM    Passive ROM  all right shoulder motions  to end range with ossilations to decrease pain, focused today on ER                  PT Long Term Goals - 11/13/17 1143      PT LONG TERM GOAL #1   Title  I with HEP    Status  Achieved      PT LONG TERM GOAL #2   Title  Right shoulder flexioin to 160 degrees or better to normalize ADLS.    Status  Partially Met      PT LONG TERM GOAL #3   Title  Right shoulder ER to 65 deg or better to allow patient to don/doff clothing.    Status  On-going            Plan - 11/22/17 1104    Clinical Impression Statement  All AROM taken in supine, Pt has pt has progressed increasing her R shoulder motion in all plane. Some limitation with internal and external rotation actively but able to achieve better rotation actively. Reports a pulling sensation with active abduction with resistance. All other exercises performed well.     Rehab Potential  Excellent    PT Frequency  3x / week    PT Duration  8 weeks    PT Treatment/Interventions  ADLs/Self Care Home Management;Cryotherapy;Electrical Stimulation;Moist Heat;Ultrasound;Neuromuscular  re-education;Therapeutic exercise;Patient/family education;Manual techniques;Vasopneumatic Device;Passive range of motion    PT Next Visit Plan  AROM, PRE's slowly       Patient will benefit from skilled therapeutic intervention in order to improve the following deficits and impairments:  Decreased range of motion, Impaired UE functional use, Pain, Postural dysfunction, Increased edema, Decreased strength  Visit Diagnosis: Muscle weakness (generalized)  Acute pain of right shoulder  Stiffness of right shoulder, not elsewhere classified     Problem List Patient Active Problem List   Diagnosis Date Noted  . Allergic rhinitis 06/23/2015  . Cervical pain 06/23/2015  . Eczema of both external ears 06/23/2015  . Acid reflux 06/23/2015  . Essential (primary) hypertension 06/23/2015  . Gastro-esophageal reflux disease without esophagitis 06/23/2015  . Hypercholesterolemia without hypertriglyceridemia 06/23/2015  . Altered blood in stool 06/23/2015  . Benign essential HTN 06/23/2015  . Hypothyroidism 06/23/2015  . Adult hypothyroidism 06/23/2015  . Iron deficiency anemia 06/23/2015  . LBP (low back pain) 06/23/2015  . Pure hypercholesterolemia 06/23/2015  . Radiculopathy of cervical region 06/23/2015  . Other seasonal allergic rhinitis 06/23/2015  . CN (constipation) 06/23/2015  . Left ankle pain 09/26/2013  . Fracture of fibula, left, closed 11/13/2012  . Nondisplaced fracture of fifth right metatarsal bone 11/13/2012  . Right ankle injury 11/13/2012    Scot Jun, PTA 11/22/2017, 11:09 AM  [address] [place], [place], [address] Phone: [phone]   Fax:  [phone]  Name: RHONDALYN CLINGAN MRN: [id number] Date of Birth: 06-Jan-1941

## 2017-11-24 ENCOUNTER — Encounter: Payer: Self-pay | Admitting: Physical Therapy

## 2017-11-24 ENCOUNTER — Ambulatory Visit: Payer: Medicare Other | Admitting: Physical Therapy

## 2017-11-24 DIAGNOSIS — M6281 Muscle weakness (generalized): Secondary | ICD-10-CM

## 2017-11-24 DIAGNOSIS — M25611 Stiffness of right shoulder, not elsewhere classified: Secondary | ICD-10-CM | POA: Diagnosis not present

## 2017-11-24 DIAGNOSIS — R1310 Dysphagia, unspecified: Secondary | ICD-10-CM | POA: Diagnosis not present

## 2017-11-24 DIAGNOSIS — M25511 Pain in right shoulder: Secondary | ICD-10-CM | POA: Diagnosis not present

## 2017-11-24 NOTE — Therapy (Signed)
[address] [place], [place], [address] Phone: [phone]   Fax:  [phone]  Physical Therapy Treatment  [name] MRN: [id number] Date of Birth: 08-07-1940 Referring Provider: Marchia Bond, MD   Encounter Date: 11/24/2017  PT End of Session - 11/24/17 1104    Visit Number  10    Date for PT Re-Evaluation  01/01/18    PT Start Time  7654    PT Stop Time  1116    PT Time Calculation (min)  61 min    Activity Tolerance  Patient tolerated treatment well    Behavior During Therapy  [facility] for tasks assessed/performed       Past Medical History:  Diagnosis Date  . GERD (gastroesophageal reflux disease)   . Hypertension   . Seasonal allergies   . Thyroid disease   . Urgency incontinence     Past Surgical History:  Procedure Laterality Date  . ABDOMINAL HYSTERECTOMY    . CESAREAN SECTION    . CYSTOCELE REPAIR      There were no vitals filed for this visit.  Subjective Assessment - 11/24/17 1014    Subjective  Reports I am still a little sore    Pain Location  Shoulder    Pain Orientation  Right    Pain Descriptors / Indicators  Sore    Aggravating Factors   doing too much                       [facility] Adult PT Treatment/Exercise - 11/24/17 0001      Shoulder Exercises: Seated   Other Seated Exercises  2# ER with elbow on ball 3 x 10 reps      Shoulder Exercises: Standing   Other Standing Exercises  3 level cabinet reaches  RUE flex 1lb and abd x10 each       Shoulder Exercises: ROM/Strengthening   UBE (Upper Arm Bike)  level 5 x 6 minutes    Lat Pull  2 plate;20 reps    Cybex Press  1 plate;20 reps    Cybex Row  1 plate;20 reps    Wall Wash  with 2# and 1# weights, flexion, scaption and CW/CCW circles    "W" Arms  20 reps    Other ROM/Strengthening Exercises  ball reach overhead, 1# weight overhead carry, some small ball and weighted ball  throwing      Shoulder Exercises: Stretch   Corner Stretch  3 reps;20 seconds      Modalities   Modalities  Electrical Stimulation;Cryotherapy      Cryotherapy   Number Minutes Cryotherapy  15 Minutes    Cryotherapy Location  Shoulder    Type of Cryotherapy  Ice pack      Electrical Stimulation   Electrical Stimulation Location  right shoulders    Electrical Stimulation Action  IFC    Electrical Stimulation Parameters  supine    Electrical Stimulation Goals  Pain      Manual Therapy   Manual Therapy  Passive ROM    Passive ROM  all right shoulder motions to end range with ossilations to decrease pain, focused today on ER                  PT Long Term Goals - 11/24/17 1120      PT LONG TERM GOAL #4   Title  Patient able to fix her  hair at PLOF     Status  Partially Met      PT LONG TERM GOAL #5   Title  Patient able to reach to L4 with R arm to ease dressing.    Status  Partially Met            Plan - 11/24/17 1105    Clinical Impression Statement  Patient reports that she is doing hair and dressing without much difficulty, she does report difficulty reaching into cabinets and with trying to change sheets.  She has pain with scaption and abduction, she does well after verbal cues for posture and scapular retraction    PT Next Visit Plan  continue to progress strength and function    Consulted and Agree with Plan of Care  Patient       Patient will benefit from skilled therapeutic intervention in order to improve the following deficits and impairments:  Decreased range of motion, Impaired UE functional use, Pain, Postural dysfunction, Increased edema, Decreased strength  Visit Diagnosis: Muscle weakness (generalized)  Acute pain of right shoulder  Stiffness of right shoulder, not elsewhere classified     Problem List Patient Active Problem List   Diagnosis Date Noted  . Allergic rhinitis 06/23/2015  . Cervical pain 06/23/2015  . Eczema of  both external ears 06/23/2015  . Acid reflux 06/23/2015  . Essential (primary) hypertension 06/23/2015  . Gastro-esophageal reflux disease without esophagitis 06/23/2015  . Hypercholesterolemia without hypertriglyceridemia 06/23/2015  . Altered blood in stool 06/23/2015  . Benign essential HTN 06/23/2015  . Hypothyroidism 06/23/2015  . Adult hypothyroidism 06/23/2015  . Iron deficiency anemia 06/23/2015  . LBP (low back pain) 06/23/2015  . Pure hypercholesterolemia 06/23/2015  . Radiculopathy of cervical region 06/23/2015  . Other seasonal allergic rhinitis 06/23/2015  . CN (constipation) 06/23/2015  . Left ankle pain 09/26/2013  . Fracture of fibula, left, closed 11/13/2012  . Nondisplaced fracture of fifth right metatarsal bone 11/13/2012  . Right ankle injury 11/13/2012    Sumner Boast., PT 11/24/2017, 11:21 AM  [address] [place], [place], [address] Phone: [phone]   Fax:  [phone]  Name: Shelly Pearson MRN: [id number] Date of Birth: 1940-12-29

## 2017-11-28 ENCOUNTER — Ambulatory Visit: Payer: Medicare Other | Admitting: Physical Therapy

## 2017-11-28 ENCOUNTER — Encounter: Payer: Self-pay | Admitting: Physical Therapy

## 2017-11-28 DIAGNOSIS — M6281 Muscle weakness (generalized): Secondary | ICD-10-CM | POA: Diagnosis not present

## 2017-11-28 DIAGNOSIS — M25511 Pain in right shoulder: Secondary | ICD-10-CM | POA: Diagnosis not present

## 2017-11-28 DIAGNOSIS — M25611 Stiffness of right shoulder, not elsewhere classified: Secondary | ICD-10-CM | POA: Diagnosis not present

## 2017-11-28 DIAGNOSIS — R1310 Dysphagia, unspecified: Secondary | ICD-10-CM | POA: Diagnosis not present

## 2017-11-28 NOTE — Therapy (Signed)
[address] [place], [place], [address] Phone: [phone]   Fax:  [phone]  Physical Therapy Treatment  [name] MRN: [id number] Date of Birth: [birth year] Referring Provider: Marchia Bond, MD   Encounter Date: 11/28/2017  PT End of Session - 11/28/17 1009    Visit Number  11    Number of Visits  24    Date for PT Re-Evaluation  01/01/18    Authorization Type  KX after visit 15; Progress note at 10th    PT Start Time  0930    PT Stop Time  1024    PT Time Calculation (min)  54 min    Activity Tolerance  Patient tolerated treatment well    Behavior During Therapy  [facility] for tasks assessed/performed       Past Medical History:  Diagnosis Date  . GERD (gastroesophageal reflux disease)   . Hypertension   . Seasonal allergies   . Thyroid disease   . Urgency incontinence     Past Surgical History:  Procedure Laterality Date  . ABDOMINAL HYSTERECTOMY    . CESAREAN SECTION    . CYSTOCELE REPAIR      There were no vitals filed for this visit.  Subjective Assessment - 11/28/17 0931    Subjective  Pt reports that overall she is doing good, some on and off R shoulder pain  over the weekend    Currently in Pain?  Yes    Pain Score  1     Pain Location  Shoulder    Pain Orientation  Right    Pain Descriptors / Indicators  -- pulling                       [facility] Adult PT Treatment/Exercise - 11/28/17 0001      Shoulder Exercises: Supine   Other Supine Exercises  1# ER/IR with elbow abducted to 70 degrees    Other Supine Exercises  serratus pressed 2x10 4lb      Shoulder Exercises: Seated   Other Seated Exercises  2# ER with elbow on ball 3 x 10 reps      Shoulder Exercises: Standing   Extension  Both;15 reps;Theraband ER and end range    Theraband Level (Shoulder Extension)  Level 1 (Yellow)    Row  Both;15 reps;Theraband with ER at end range     Theraband Level (Shoulder Row)  Level 1 (Yellow)    Other Standing Exercises  3 level cabinet reaches  RUE flex 1lb and abd x15 each       Shoulder Exercises: ROM/Strengthening   UBE (Upper Arm Bike)  level 5 x 6 minutes    Lat Pull  2 plate;20 reps    Cybex Row  2 plate;20 reps      Cryotherapy   Number Minutes Cryotherapy  15 Minutes    Cryotherapy Location  Shoulder    Type of Cryotherapy  Ice pack      Electrical Stimulation   Electrical Stimulation Location  right shoulders    Electrical Stimulation Action  IFC    Electrical Stimulation Parameters  supine    Electrical Stimulation Goals  Pain      Manual Therapy   Manual Therapy  Passive ROM    Passive ROM  all right shoulder motions to end range with ossilations to decrease pain, focused today on ER  PT Long Term Goals - 11/24/17 1120      PT LONG TERM GOAL #4   Title  Patient able to fix her hair at PLOF     Status  Partially Met      PT LONG TERM GOAL #5   Title  Patient able to reach to L4 with R arm to ease dressing.    Status  Partially Met            Plan - 11/28/17 1010    Clinical Impression Statement  Pt stated that she is able to touch the back of her neck with the star gazer stretch. PROM remains well. Cues to keep R elbow in with 3 level cabinet reaches. Pulling sensation during supine ER with 1lb dumbbell    Rehab Potential  Excellent    PT Frequency  3x / week    PT Treatment/Interventions  ADLs/Self Care Home Management;Cryotherapy;Electrical Stimulation;Moist Heat;Ultrasound;Neuromuscular re-education;Therapeutic exercise;Patient/family education;Manual techniques;Vasopneumatic Device;Passive range of motion    PT Next Visit Plan  continue to progress strength and function       Patient will benefit from skilled therapeutic intervention in order to improve the following deficits and impairments:  Decreased range of motion, Impaired UE functional use, Pain, Postural  dysfunction, Increased edema, Decreased strength, Abnormal gait  Visit Diagnosis: Acute pain of right shoulder  Muscle weakness (generalized)  Stiffness of right shoulder, not elsewhere classified     Problem List Patient Active Problem List   Diagnosis Date Noted  . Allergic rhinitis 06/23/2015  . Cervical pain 06/23/2015  . Eczema of both external ears 06/23/2015  . Acid reflux 06/23/2015  . Essential (primary) hypertension 06/23/2015  . Gastro-esophageal reflux disease without esophagitis 06/23/2015  . Hypercholesterolemia without hypertriglyceridemia 06/23/2015  . Altered blood in stool 06/23/2015  . Benign essential HTN 06/23/2015  . Hypothyroidism 06/23/2015  . Adult hypothyroidism 06/23/2015  . Iron deficiency anemia 06/23/2015  . LBP (low back pain) 06/23/2015  . Pure hypercholesterolemia 06/23/2015  . Radiculopathy of cervical region 06/23/2015  . Other seasonal allergic rhinitis 06/23/2015  . CN (constipation) 06/23/2015  . Left ankle pain 09/26/2013  . Fracture of fibula, left, closed 11/13/2012  . Nondisplaced fracture of fifth right metatarsal bone 11/13/2012  . Right ankle injury 11/13/2012     G , PTA 11/28/2017, 10:12 AM  [facility] [address] [place], [place], [address] Phone: [phone]   Fax:  [phone]  Name: Haydn R Worrel MRN: [id number] Date of Birth: 02/04/1941   

## 2017-11-29 ENCOUNTER — Encounter: Payer: Self-pay | Admitting: Physical Therapy

## 2017-11-29 ENCOUNTER — Other Ambulatory Visit: Payer: PRIVATE HEALTH INSURANCE

## 2017-11-29 ENCOUNTER — Ambulatory Visit: Payer: Medicare Other | Admitting: Physical Therapy

## 2017-11-29 DIAGNOSIS — R1310 Dysphagia, unspecified: Secondary | ICD-10-CM | POA: Diagnosis not present

## 2017-11-29 DIAGNOSIS — M25611 Stiffness of right shoulder, not elsewhere classified: Secondary | ICD-10-CM | POA: Diagnosis not present

## 2017-11-29 DIAGNOSIS — M25511 Pain in right shoulder: Secondary | ICD-10-CM | POA: Diagnosis not present

## 2017-11-29 DIAGNOSIS — M6281 Muscle weakness (generalized): Secondary | ICD-10-CM

## 2017-11-29 NOTE — Therapy (Signed)
[address] [place], [place], [address] Phone: [phone]   Fax:  [phone]  Physical Therapy Treatment  [name] MRN: [id number] Date of Birth: 23-Sep-1940 Referring Provider: Marchia Bond, MD   Encounter Date: 11/29/2017  PT End of Session - 11/29/17 1009    Visit Number  12    Number of Visits  24    Date for PT Re-Evaluation  01/01/18    PT Start Time  0930    PT Stop Time  1025    PT Time Calculation (min)  55 min    Activity Tolerance  Patient tolerated treatment well    Behavior During Therapy  [facility] for tasks assessed/performed       Past Medical History:  Diagnosis Date  . GERD (gastroesophageal reflux disease)   . Hypertension   . Seasonal allergies   . Thyroid disease   . Urgency incontinence     Past Surgical History:  Procedure Laterality Date  . ABDOMINAL HYSTERECTOMY    . CESAREAN SECTION    . CYSTOCELE REPAIR      There were no vitals filed for this visit.  Subjective Assessment - 11/29/17 0932    Subjective  "Going good"     Currently in Pain?  No/denies    Pain Score  0-No pain                       [facility] Adult PT Treatment/Exercise - 11/29/17 0001      Shoulder Exercises: Supine   Other Supine Exercises  1# ER/IR with elbow abducted to 70 degrees      Shoulder Exercises: Standing   Horizontal ABduction  Theraband;Both;15 reps x2    Theraband Level (Shoulder Horizontal ABduction)  Level 1 (Yellow)    Flexion  Theraband;10 reps;Right x2    ABduction  Theraband;Right;10 reps x2    Theraband Level (Shoulder ABduction)  Level 1 (Yellow)    Other Standing Exercises  bicep curls 5lb x15, ttricept ext 15lb x15     Other Standing Exercises  3 level cabinet reaches  RUE flex  and abd 1lb x15 each       Shoulder Exercises: ROM/Strengthening   UBE (Upper Arm Bike)  level 3.5 x 6 minutes      Shoulder Exercises: Power Insurance account manager & Lats 15lb 2x10       Vasopneumatic   Number Minutes Vasopneumatic   15 minutes    Vasopnuematic Location   Shoulder    Vasopneumatic Pressure  Low    Vasopneumatic Temperature   38                  PT Long Term Goals - 11/24/17 1120      PT LONG TERM GOAL #4   Title  Patient able to fix her hair at PLOF     Status  Partially Met      PT LONG TERM GOAL #5   Title  Patient able to reach to L4 with R arm to ease dressing.    Status  Partially Met            Plan - 11/29/17 1010    Clinical Impression Statement  Pt continues to do well in therapy. Pt able to do R shoulder flexion and abduction with minium shoulder elevation. Good motion with supine R shoulder ER/IR. Able to  tolerated 3 level cabinet reaches abduction with resistance.    Rehab Potential  Excellent    PT Frequency  3x / week    PT Duration  8 weeks    PT Treatment/Interventions  ADLs/Self Care Home Management;Cryotherapy;Electrical Stimulation;Moist Heat;Ultrasound;Neuromuscular re-education;Therapeutic exercise;Patient/family education;Manual techniques;Vasopneumatic Device;Passive range of motion    PT Next Visit Plan  continue to progress strength and function       Patient will benefit from skilled therapeutic intervention in order to improve the following deficits and impairments:  Decreased range of motion, Impaired UE functional use, Pain, Postural dysfunction, Increased edema, Decreased strength, Abnormal gait  Visit Diagnosis: Acute pain of right shoulder  Muscle weakness (generalized)  Stiffness of right shoulder, not elsewhere classified     Problem List Patient Active Problem List   Diagnosis Date Noted  . Allergic rhinitis 06/23/2015  . Cervical pain 06/23/2015  . Eczema of both external ears 06/23/2015  . Acid reflux 06/23/2015  . Essential (primary) hypertension 06/23/2015  . Gastro-esophageal reflux disease without esophagitis  06/23/2015  . Hypercholesterolemia without hypertriglyceridemia 06/23/2015  . Altered blood in stool 06/23/2015  . Benign essential HTN 06/23/2015  . Hypothyroidism 06/23/2015  . Adult hypothyroidism 06/23/2015  . Iron deficiency anemia 06/23/2015  . LBP (low back pain) 06/23/2015  . Pure hypercholesterolemia 06/23/2015  . Radiculopathy of cervical region 06/23/2015  . Other seasonal allergic rhinitis 06/23/2015  . CN (constipation) 06/23/2015  . Left ankle pain 09/26/2013  . Fracture of fibula, left, closed 11/13/2012  . Nondisplaced fracture of fifth right metatarsal bone 11/13/2012  . Right ankle injury 11/13/2012    Scot Jun, PTA 11/29/2017, 10:12 AM  [address] [place], [place], [address] Phone: [phone]   Fax:  [phone]  Name: Shelly Pearson MRN: [id number] Date of Birth: 11/18/1940

## 2017-11-30 ENCOUNTER — Encounter: Payer: Self-pay | Admitting: Family Medicine

## 2017-11-30 ENCOUNTER — Ambulatory Visit (INDEPENDENT_AMBULATORY_CARE_PROVIDER_SITE_OTHER): Payer: Medicare Other | Admitting: Family Medicine

## 2017-11-30 VITALS — BP 120/80 | HR 76 | Ht 64.0 in | Wt 135.0 lb

## 2017-11-30 DIAGNOSIS — I10 Essential (primary) hypertension: Secondary | ICD-10-CM

## 2017-11-30 DIAGNOSIS — E7800 Pure hypercholesterolemia, unspecified: Secondary | ICD-10-CM

## 2017-11-30 DIAGNOSIS — E039 Hypothyroidism, unspecified: Secondary | ICD-10-CM

## 2017-11-30 DIAGNOSIS — K219 Gastro-esophageal reflux disease without esophagitis: Secondary | ICD-10-CM

## 2017-11-30 NOTE — Patient Instructions (Addendum)
It was very nice to meet you! We will call you once we get you records in and discuss recommendations (ie. Additional labs, vaccination or screening tests) Continue current medications, let me know when you need refills.

## 2017-11-30 NOTE — Assessment & Plan Note (Addendum)
Well controlled at this time. Was having some dysphagia and had barium swallow study and swallow eval as well, will await previous records.

## 2017-11-30 NOTE — Assessment & Plan Note (Signed)
Tolerating atorvastatin well, continue.  

## 2017-11-30 NOTE — Progress Notes (Signed)
Patient ID: Shelly Pearson, female   DOB: 1940-10-18, 77 y.o.   MRN: [id number]  Shelly Pearson - 77 y.o. female MRN [id number]  Date of birth: 03/12/1941  Subjective Chief Complaint  Patient presents with  . Medication Management    HPI Shelly Pearson is a 77 y.o. female with a history of HTN, hypothyroidism, hyperlipidemia, GERD and urge incontinence here to establish care with new PCP.  She reports that chronic conditions are well controlled with current medications and she denies side effects.  She did have a cpe in 07/2017 with labs done at that time.  Unfortunately we do not have her records from [facility] yet.  She is not in need of any refills at this time.  She is currently undergoing PT for a rotator cuff injury, this is going well.   Review of Systems  Constitutional: Negative for chills, fever, malaise/fatigue and weight loss.  HENT: Negative for congestion, ear pain and sore throat.   Eyes: Negative for blurred vision, double vision and pain.  Respiratory: Negative for cough and shortness of breath.   Cardiovascular: Negative for chest pain and palpitations.  Gastrointestinal: Negative for abdominal pain, blood in stool, constipation, heartburn and nausea.  Genitourinary: Negative for dysuria and urgency.  Musculoskeletal: Negative for joint pain and myalgias.       Shoulder pain   Neurological: Negative for dizziness and headaches.  Endo/Heme/Allergies: Does not bruise/bleed easily.  Psychiatric/Behavioral: Negative for depression. The patient is not nervous/anxious and does not have insomnia.     Allergies  Allergen Reactions  . Adhesive  [Tape] Other (See Comments)    Past Medical History:  Diagnosis Date  . GERD (gastroesophageal reflux disease)   . Hypertension   . Seasonal allergies   . Thyroid disease   . Urgency incontinence     Past Surgical History:  Procedure Laterality Date  . ABDOMINAL HYSTERECTOMY    . CESAREAN SECTION    . CYSTOCELE  REPAIR      Social History   Socioeconomic History  . Marital status: Married    Spouse name: Not on file  . Number of children: Not on file  . Years of education: Not on file  . Highest education level: Not on file  Occupational History  . Not on file  Social Needs  . Financial resource strain: Not on file  . Food insecurity:    Worry: Not on file    Inability: Not on file  . Transportation needs:    Medical: Not on file    Non-medical: Not on file  Tobacco Use  . Smoking status: Never Smoker  . Smokeless tobacco: Never Used  Substance and Sexual Activity  . Alcohol use: No  . Drug use: No  . Sexual activity: Not on file  Lifestyle  . Physical activity:    Days per week: Not on file    Minutes per session: Not on file  . Stress: Not on file  Relationships  . Social connections:    Talks on phone: Not on file    Gets together: Not on file    Attends religious service: Not on file    Active member of club or organization: Not on file    Attends meetings of clubs or organizations: Not on file    Relationship status: Not on file  Other Topics Concern  . Not on file  Social History Narrative  . Not on file    Family History  Problem Relation  Age of Onset  . Stroke Mother   . Cancer Father   . Cancer Sister   . Cancer Brother     Health Maintenance  Topic Date Due  . Samul Dada  05/16/1962  . MAMMOGRAM  05/16/1993  . COLONOSCOPY  05/16/1993  . DEXA SCAN  05/16/2008  . PNA vac Low Risk Adult (1 of 2 - PCV13) 05/16/2008  . INFLUENZA VACCINE  02/01/2018    ----------------------------------------------------------------------------------------------------------------------------------------------------------------------------------------------------------------- Physical Exam BP 120/80 (BP Location: Left Arm, Patient Position: Sitting, Cuff Size: Normal)   Pulse 76   Ht 5\' 4"  (1.626 m)   Wt 135 lb (61.2 kg)   BMI 23.17 kg/m   Physical Exam    Constitutional: She is oriented to person, place, and time. She appears well-nourished. No distress.  HENT:  Head: Normocephalic and atraumatic.  Mouth/Throat: Oropharynx is clear and moist.  Eyes: Conjunctivae are normal. No scleral icterus.  Neck: No thyromegaly present.  Cardiovascular: Normal rate and regular rhythm.  Pulmonary/Chest: Effort normal and breath sounds normal.  Musculoskeletal: She exhibits no edema.  Lymphadenopathy:    She has no cervical adenopathy.  Neurological: She is alert and oriented to person, place, and time.  Skin: Skin is warm and dry. No rash noted.  Psychiatric: She has a normal mood and affect. Her behavior is normal.    ------------------------------------------------------------------------------------------------------------------------------------------------------------------------------------------------------------------- Assessment and Plan  Gastro-esophageal reflux disease without esophagitis Well controlled at this time. Was having some dysphagia and had barium swallow study and swallow eval as well, will await previous records.    Benign essential HTN BP is well controlled continue current medications.  Will review last labs once records received.   Hypercholesterolemia without hypertriglyceridemia Tolerating atorvastatin well, continue   Hypothyroidism No symptoms of hypo/hyperthyroidism, continue current dosing.  Will review recent TSH once records received.

## 2017-11-30 NOTE — Assessment & Plan Note (Signed)
BP is well controlled continue current medications.  Will review last labs once records received.

## 2017-11-30 NOTE — Assessment & Plan Note (Signed)
No symptoms of hypo/hyperthyroidism, continue current dosing.  Will review recent TSH once records received.

## 2017-12-01 ENCOUNTER — Ambulatory Visit: Payer: Medicare Other | Admitting: Physical Therapy

## 2017-12-01 ENCOUNTER — Encounter: Payer: Self-pay | Admitting: Physical Therapy

## 2017-12-01 DIAGNOSIS — M25511 Pain in right shoulder: Secondary | ICD-10-CM | POA: Diagnosis not present

## 2017-12-01 DIAGNOSIS — M6281 Muscle weakness (generalized): Secondary | ICD-10-CM | POA: Diagnosis not present

## 2017-12-01 DIAGNOSIS — R1310 Dysphagia, unspecified: Secondary | ICD-10-CM | POA: Diagnosis not present

## 2017-12-01 DIAGNOSIS — M25611 Stiffness of right shoulder, not elsewhere classified: Secondary | ICD-10-CM | POA: Diagnosis not present

## 2017-12-01 NOTE — Therapy (Signed)
[address] [place], [place], [address] Phone: [phone]   Fax:  [phone]  Physical Therapy Treatment  [name] MRN: [id number] Date of Birth: 11-11-1940 Referring Provider: Marchia Bond, MD   Encounter Date: 12/01/2017  PT End of Session - 12/01/17 1011    Visit Number  13    Number of Visits  24    Date for PT Re-Evaluation  01/01/18    PT Start Time  0930    PT Stop Time  1025    PT Time Calculation (min)  55 min    Activity Tolerance  Patient tolerated treatment well    Behavior During Therapy  [facility] for tasks assessed/performed       Past Medical History:  Diagnosis Date  . GERD (gastroesophageal reflux disease)   . Hypertension   . Seasonal allergies   . Thyroid disease   . Urgency incontinence     Past Surgical History:  Procedure Laterality Date  . ABDOMINAL HYSTERECTOMY    . CESAREAN SECTION    . CYSTOCELE REPAIR      There were no vitals filed for this visit.  Subjective Assessment - 12/01/17 0931    Subjective  "Im good today, I am doing fine I still have some soreness"    Currently in Pain?  No/denies    Pain Score  0-No pain                       [facility] Adult PT Treatment/Exercise - 12/01/17 0001      Shoulder Exercises: Standing   Extension  Both;15 reps;Theraband    Theraband Level (Shoulder Extension)  Level 2 (Red)    Row  Both;15 reps;Theraband    Theraband Level (Shoulder Row)  Level 2 (Red)    Other Standing Exercises  yellow tband flex RUE, flex to abd, abd to flex x10 each    Other Standing Exercises  3 level cabinet reaches  RUE flex  and abd 1lb x10 each then no weight x10, weightd ball back ro wakk overhesd       Shoulder Exercises: ROM/Strengthening   UBE (Upper Arm Bike)  level 3.5 x 6 minutes      Shoulder Exercises: Power Control and instrumentation engineer & Lats 15lb 2x10       Vasopneumatic    Number Minutes Vasopneumatic   15 minutes    Vasopnuematic Location   Shoulder    Vasopneumatic Pressure  Medium    Vasopneumatic Temperature   38      Manual Therapy   Manual Therapy  Passive ROM;Soft tissue mobilization    Soft tissue mobilization  Anterior delt    Passive ROM  all right shoulder motions to end range                  PT Long Term Goals - 12/01/17 1015      PT LONG TERM GOAL #2   Title  Right shoulder flexioin to 160 degrees or better to normalize ADLS.    Status  Partially Met      PT LONG TERM GOAL #3   Title  Right shoulder ER to 65 deg or better to allow patient to don/doff clothing.    Status  Partially Met      PT LONG TERM GOAL #4   Title  Patient able to fix her hair at  PLOF     Status  Partially Met      PT LONG TERM GOAL #5   Title  Patient able to reach to L4 with R arm to ease dressing.    Status  Partially Met            Plan - 12/01/17 1015    Clinical Impression Statement  Progressing well, R shoulder does fatigue with flexion and abduction under light load. She does reports some soreness in R bicep from last session. She does have some tightness in R beck noted during STM.     Rehab Potential  Excellent    PT Frequency  3x / week    PT Duration  8 weeks    PT Treatment/Interventions  ADLs/Self Care Home Management;Cryotherapy;Electrical Stimulation;Moist Heat;Ultrasound;Neuromuscular re-education;Therapeutic exercise;Patient/family education;Manual techniques;Vasopneumatic Device;Passive range of motion    PT Next Visit Plan  continue to progress strength and function       Patient will benefit from skilled therapeutic intervention in order to improve the following deficits and impairments:  Decreased range of motion, Impaired UE functional use, Pain, Postural dysfunction, Increased edema, Decreased strength, Abnormal gait  Visit Diagnosis: Acute pain of right shoulder  Stiffness of right shoulder, not elsewhere  classified  Muscle weakness (generalized)     Problem List Patient Active Problem List   Diagnosis Date Noted  . Allergic rhinitis 06/23/2015  . Gastro-esophageal reflux disease without esophagitis 06/23/2015  . Hypercholesterolemia without hypertriglyceridemia 06/23/2015  . Benign essential HTN 06/23/2015  . Hypothyroidism 06/23/2015  . Iron deficiency anemia 06/23/2015  . Other seasonal allergic rhinitis 06/23/2015  . Fracture of fibula, left, closed 11/13/2012  . Nondisplaced fracture of fifth right metatarsal bone 11/13/2012    Scot Jun, PTA 12/01/2017, 10:18 AM  [place] [address] [place], [place], [address] Phone: [phone]   Fax:  [phone]  Name: Shelly Pearson MRN: [id number] Date of Birth: 05-30-1941

## 2017-12-04 ENCOUNTER — Encounter: Payer: Self-pay | Admitting: Physical Therapy

## 2017-12-04 ENCOUNTER — Ambulatory Visit: Payer: Medicare Other | Attending: Orthopedic Surgery | Admitting: Physical Therapy

## 2017-12-04 DIAGNOSIS — R1310 Dysphagia, unspecified: Secondary | ICD-10-CM | POA: Insufficient documentation

## 2017-12-04 DIAGNOSIS — M25611 Stiffness of right shoulder, not elsewhere classified: Secondary | ICD-10-CM | POA: Diagnosis not present

## 2017-12-04 DIAGNOSIS — M25511 Pain in right shoulder: Secondary | ICD-10-CM | POA: Insufficient documentation

## 2017-12-04 DIAGNOSIS — M6281 Muscle weakness (generalized): Secondary | ICD-10-CM | POA: Diagnosis not present

## 2017-12-04 DIAGNOSIS — R600 Localized edema: Secondary | ICD-10-CM | POA: Diagnosis not present

## 2017-12-04 NOTE — Therapy (Signed)
[address] [place], [place], [address] Phone: [phone]   Fax:  [phone]  Physical Therapy Treatment  [name] MRN: [id number] Date of Birth: 1941/03/30 Referring Provider: Marchia Bond, MD   Encounter Date: 12/04/2017  PT End of Session - 12/04/17 0839    Visit Number  14    Date for PT Re-Evaluation  01/01/18    PT Start Time  0800    PT Stop Time  1157    PT Time Calculation (min)  54 min       Past Medical History:  Diagnosis Date  . GERD (gastroesophageal reflux disease)   . Hypertension   . Seasonal allergies   . Thyroid disease   . Urgency incontinence     Past Surgical History:  Procedure Laterality Date  . ABDOMINAL HYSTERECTOMY    . CESAREAN SECTION    . CYSTOCELE REPAIR      There were no vitals filed for this visit.  Subjective Assessment - 12/04/17 0803    Subjective  Pt reports that she is doing well, still gets some soreness in her R shoulder at times    Currently in Pain?  No/denies    Pain Score  0-No pain         [facility] PT Assessment - 12/04/17 0001      AROM   Right Shoulder Flexion  180 Degrees    Right Shoulder ABduction  176 Degrees    Right Shoulder Internal Rotation  45 Degrees    Right Shoulder External Rotation  86 Degrees                   [facility] Adult PT Treatment/Exercise - 12/04/17 0001      Shoulder Exercises: Seated   Other Seated Exercises  bent over roes 3lb, rev flys & front raises 1lb 2x10 each       Shoulder Exercises: Standing   External Rotation  15 reps;Theraband;Right;Strengthening x2    Internal Rotation  Strengthening;Right;15 reps;Theraband x2    Theraband Level (Shoulder Internal Rotation)  Level 2 (Red)    Extension  Both;Theraband;20 reps    Theraband Level (Shoulder Extension)  Level 3 (Green)    Row  Both;Theraband;20 reps    Theraband Level (Shoulder Row)  Level 2 (Red)    Other Standing Exercises  yellow tband flex RUE, flex to abd, abd to flex 2x5 each    Other Standing Exercises  3 level cabinet reaches  RUE flex  and abd 1lb x10 each then no weight x10, weightd ball back ro wakk overhesd       Shoulder Exercises: ROM/Strengthening   UBE (Upper Arm Bike)  level 3.5 x 6 minutes      Shoulder Exercises: Stretch   Other Shoulder Stretches  IR towell stretch       Shoulder Exercises: Power Control and instrumentation engineer & Lats 15lb 2x10       Vasopneumatic   Number Minutes Vasopneumatic   15 minutes    Vasopnuematic Location   Shoulder    Vasopneumatic Pressure  Medium    Vasopneumatic Temperature   38                  PT Long Term Goals - 12/01/17 1015      PT LONG TERM GOAL #2   Title  Right shoulder flexioin to 160 degrees or better to  normalize ADLS.    Status  Partially Met      PT LONG TERM GOAL #3   Title  Right shoulder ER to 65 deg or better to allow patient to don/doff clothing.    Status  Partially Met      PT LONG TERM GOAL #4   Title  Patient able to fix her hair at PLOF     Status  Partially Met      PT LONG TERM GOAL #5   Title  Patient able to reach to L4 with R arm to ease dressing.    Status  Partially Met            Plan - 12/04/17 0843    Clinical Impression Statement  All AROM taken in standing, Pt limited with IR with RUE abducted to 90, introduced pt to IR towel stretch. Pt R shoulder fatigues quick with resisted abducted and flexion quickly, with little no shoulder elevation. All other exercises performed well. She does report a sharp pain with towel IR stretch, Asked pt not to be too aggressive.    Rehab Potential  Excellent    PT Frequency  3x / week    PT Duration  8 weeks    PT Treatment/Interventions  ADLs/Self Care Home Management;Cryotherapy;Electrical Stimulation;Moist Heat;Ultrasound;Neuromuscular re-education;Therapeutic exercise;Patient/family education;Manual  techniques;Vasopneumatic Device;Passive range of motion    PT Next Visit Plan  continue to progress strength and function, IR ROM       Patient will benefit from skilled therapeutic intervention in order to improve the following deficits and impairments:  Decreased range of motion, Impaired UE functional use, Pain, Postural dysfunction, Increased edema, Decreased strength, Abnormal gait  Visit Diagnosis: Acute pain of right shoulder  Stiffness of right shoulder, not elsewhere classified  Muscle weakness (generalized)     Problem List Patient Active Problem List   Diagnosis Date Noted  . Allergic rhinitis 06/23/2015  . Gastro-esophageal reflux disease without esophagitis 06/23/2015  . Hypercholesterolemia without hypertriglyceridemia 06/23/2015  . Benign essential HTN 06/23/2015  . Hypothyroidism 06/23/2015  . Iron deficiency anemia 06/23/2015  . Other seasonal allergic rhinitis 06/23/2015  . Fracture of fibula, left, closed 11/13/2012  . Nondisplaced fracture of fifth right metatarsal bone 11/13/2012    Scot Jun, PTA 12/04/2017, 8:46 AM  [address] [place], [place], [address] Phone: [phone]   Fax:  [phone]  Name: Shelly Pearson MRN: [id number] Date of Birth: [birth year]

## 2017-12-05 ENCOUNTER — Telehealth: Payer: Self-pay | Admitting: Family Medicine

## 2017-12-05 NOTE — Telephone Encounter (Signed)
Copied from [place] [id number]. Topic: Quick Communication - See Telephone Encounter >> Dec 05, 2017 10:51 AM Cleaster Corin, NT wrote: CRM for notification. See Telephone encounter for: 12/05/17. Pt. Calling to see if her records have been sent to office and she is needing to know if and immuzation's are needed.

## 2017-12-06 ENCOUNTER — Ambulatory Visit: Payer: Medicare Other | Admitting: Physical Therapy

## 2017-12-06 ENCOUNTER — Encounter: Payer: Self-pay | Admitting: Physical Therapy

## 2017-12-06 DIAGNOSIS — R600 Localized edema: Secondary | ICD-10-CM | POA: Diagnosis not present

## 2017-12-06 DIAGNOSIS — M6281 Muscle weakness (generalized): Secondary | ICD-10-CM | POA: Diagnosis not present

## 2017-12-06 DIAGNOSIS — M25611 Stiffness of right shoulder, not elsewhere classified: Secondary | ICD-10-CM

## 2017-12-06 DIAGNOSIS — R1310 Dysphagia, unspecified: Secondary | ICD-10-CM

## 2017-12-06 DIAGNOSIS — M25511 Pain in right shoulder: Secondary | ICD-10-CM

## 2017-12-06 NOTE — Telephone Encounter (Signed)
Left a VM for patient regarding our office still not receiving her records from [place]. Stated that our office has fax over a release form multiple times and still have not receive anything from their office. Advise patient to give their office a call again to check the status of her records.

## 2017-12-06 NOTE — Therapy (Signed)
[address] [place], [place], [address] Phone: [phone]   Fax:  [phone]  Physical Therapy Treatment  [name] MRN: [id number] Date of Birth: 1941-04-19 Referring Provider: Marchia Bond, MD   Encounter Date: 12/06/2017  PT End of Session - 12/06/17 1057    Visit Number  15    Date for PT Re-Evaluation  01/01/18    PT Start Time  0814    PT Stop Time  1112    PT Time Calculation (min)  57 min    Activity Tolerance  Patient tolerated treatment well    Behavior During Therapy  [facility] for tasks assessed/performed       Past Medical History:  Diagnosis Date  . GERD (gastroesophageal reflux disease)   . Hypertension   . Seasonal allergies   . Thyroid disease   . Urgency incontinence     Past Surgical History:  Procedure Laterality Date  . ABDOMINAL HYSTERECTOMY    . CESAREAN SECTION    . CYSTOCELE REPAIR      There were no vitals filed for this visit.  Subjective Assessment - 12/06/17 1019    Subjective  "Just soreness"    Currently in Pain?  No/denies    Pain Score  0-No pain    Pain Location  Shoulder    Pain Orientation  Right    Pain Descriptors / Indicators  Sore                       [facility] Adult PT Treatment/Exercise - 12/06/17 0001      Shoulder Exercises: Standing   External Rotation  10 reps;Theraband;Both x2, back to wall    Theraband Level (Shoulder External Rotation)  Level 1 (Yellow)    Other Standing Exercises  yellow tband flex RUE, flex to abd, abd to flex 2x5 each    Other Standing Exercises  3 level cabinet reaches  RUE flex  and abd 1lb x10 each then no weight x10, weightd ball overhead carry 3 pressed       Shoulder Exercises: ROM/Strengthening   UBE (Upper Arm Bike)  level 3.5 x 6 minutes      Shoulder Exercises: Stretch   Other Shoulder Stretches  IR towell stretch       Shoulder Exercises: Power Publishing copy Exercises  chest press 5lb 2x10     Other Power Actuary & Lats 20lb 2x10       Vasopneumatic   Number Minutes Vasopneumatic   15 minutes    Vasopnuematic Location   Shoulder    Vasopneumatic Pressure  Medium    Vasopneumatic Temperature   38      Manual Therapy   Manual Therapy  Passive ROM    Passive ROM  R shoulder ER/IR                  PT Long Term Goals - 12/01/17 1015      PT LONG TERM GOAL #2   Title  Right shoulder flexioin to 160 degrees or better to normalize ADLS.    Status  Partially Met      PT LONG TERM GOAL #3   Title  Right shoulder ER to 65 deg or better to allow patient to don/doff clothing.    Status  Partially Met      PT LONG TERM GOAL #4  Title  Patient able to fix her hair at PLOF     Status  Partially Met      PT LONG TERM GOAL #5   Title  Patient able to reach to L4 with R arm to ease dressing.    Status  Partially Met            Plan - 12/06/17 1057    Clinical Impression Statement  pt does well with all TE.She does reports some difficulty with ER with her back against the wall due to weakness. She report some burning with isometric holds overhead. She still has some limitations with R shoulder ER/IR noted with PROM    Rehab Potential  Excellent    PT Frequency  3x / week    PT Duration  8 weeks    PT Treatment/Interventions  ADLs/Self Care Home Management;Cryotherapy;Electrical Stimulation;Moist Heat;Ultrasound;Neuromuscular re-education;Therapeutic exercise;Patient/family education;Manual techniques;Vasopneumatic Device;Passive range of motion    PT Next Visit Plan  continue to progress strength and function, IR ROM       Patient will benefit from skilled therapeutic intervention in order to improve the following deficits and impairments:  Decreased range of motion, Impaired UE functional use, Pain, Postural dysfunction, Increased edema, Decreased strength, Abnormal gait  Visit Diagnosis: Acute pain  of right shoulder  Stiffness of right shoulder, not elsewhere classified  Muscle weakness (generalized)  Dysphagia, unspecified type     Problem List Patient Active Problem List   Diagnosis Date Noted  . Allergic rhinitis 06/23/2015  . Gastro-esophageal reflux disease without esophagitis 06/23/2015  . Hypercholesterolemia without hypertriglyceridemia 06/23/2015  . Benign essential HTN 06/23/2015  . Hypothyroidism 06/23/2015  . Iron deficiency anemia 06/23/2015  . Other seasonal allergic rhinitis 06/23/2015  . Fracture of fibula, left, closed 11/13/2012  . Nondisplaced fracture of fifth right metatarsal bone 11/13/2012    Scot Jun , PTA 12/06/2017, 11:00 AM  [address] [place], [place], [address] Phone: [phone]   Fax:  [phone]  Name: Shelly Pearson MRN: [id number] Date of Birth: Apr 08, 1941

## 2017-12-08 ENCOUNTER — Ambulatory Visit: Payer: Medicare Other | Admitting: Physical Therapy

## 2017-12-08 ENCOUNTER — Encounter: Payer: PRIVATE HEALTH INSURANCE | Admitting: Physical Therapy

## 2017-12-11 ENCOUNTER — Encounter: Payer: Self-pay | Admitting: Physical Therapy

## 2017-12-11 ENCOUNTER — Ambulatory Visit: Payer: Medicare Other | Admitting: Physical Therapy

## 2017-12-11 DIAGNOSIS — M25511 Pain in right shoulder: Secondary | ICD-10-CM

## 2017-12-11 DIAGNOSIS — M25611 Stiffness of right shoulder, not elsewhere classified: Secondary | ICD-10-CM | POA: Diagnosis not present

## 2017-12-11 DIAGNOSIS — M6281 Muscle weakness (generalized): Secondary | ICD-10-CM | POA: Diagnosis not present

## 2017-12-11 DIAGNOSIS — R1310 Dysphagia, unspecified: Secondary | ICD-10-CM | POA: Diagnosis not present

## 2017-12-11 DIAGNOSIS — R600 Localized edema: Secondary | ICD-10-CM | POA: Diagnosis not present

## 2017-12-11 NOTE — Therapy (Signed)
[address] [place], [place], [address] Phone: [phone]   Fax:  [phone]  Physical Therapy Treatment  Patient Details  Name: KEON BENSCOTER MRN: [id number] Date of Birth: [birth year] Referring Provider: Marchia Bond, MD   Encounter Date: 12/11/2017  PT End of Session - 12/11/17 1012    Visit Number  16    Date for PT Re-Evaluation  01/01/18    PT Start Time  0930    PT Stop Time  1028    PT Time Calculation (min)  58 min    Activity Tolerance  Patient tolerated treatment well    Behavior During Therapy  [facility] for tasks assessed/performed       Past Medical History:  Diagnosis Date  . GERD (gastroesophageal reflux disease)   . Hypertension   . Seasonal allergies   . Thyroid disease   . Urgency incontinence     Past Surgical History:  Procedure Laterality Date  . ABDOMINAL HYSTERECTOMY    . CESAREAN SECTION    . CYSTOCELE REPAIR      There were no vitals filed for this visit.  Subjective Assessment - 12/11/17 0929    Subjective  "It has been uncomfortable this weekend but the exercises help"    Currently in Pain?  No/denies    Pain Score  0-No pain                       [facility] Adult PT Treatment/Exercise - 12/11/17 0001      Shoulder Exercises: Standing   Horizontal ABduction  Theraband;Both;20 reps    Theraband Level (Shoulder Horizontal ABduction)  Level 1 (Yellow)    External Rotation  10 reps;Theraband;Both 2x10 back to wall    Theraband Level (Shoulder External Rotation)  Level 2 (Red)    Internal Rotation  Strengthening;Right;15 reps;Theraband x2 back against wall     Theraband Level (Shoulder Internal Rotation)  Level 2 (Red)    Extension  Both;Theraband;20 reps    Theraband Level (Shoulder Extension)  Level 3 (Green)    Row  Both;Theraband;20 reps    Theraband Level (Shoulder Row)  Level 3 (Green)    Other Standing Exercises  bicep curls 5lb 2 x10,  ttricept ext 15lb 2x10     Other Standing Exercises  3 level cabinet reaches  RUE flex and Abd 2lb x10       Shoulder Exercises: ROM/Strengthening   UBE (Upper Arm Bike)  level 3.5 x 6 minutes      Shoulder Exercises: Stretch   Other Shoulder Stretches  IR towell stretch       Shoulder Exercises: Power Warden/ranger Exercises  chest press 5lb 2x10     Other Power Actuary & Lats 20lb 2x10       Vasopneumatic   Number Minutes Vasopneumatic   15 minutes    Vasopnuematic Location   Shoulder    Vasopneumatic Pressure  Medium    Vasopneumatic Temperature   38      Manual Therapy   Manual Therapy  Passive ROM    Passive ROM  R shoulder ER/IR                  PT Long Term Goals - 12/01/17 1015      PT LONG TERM GOAL #2   Title  Right shoulder flexioin to 160 degrees or better to normalize ADLS.  Status  Partially Met      PT LONG TERM GOAL #3   Title  Right shoulder ER to 65 deg or better to allow patient to don/doff clothing.    Status  Partially Met      PT LONG TERM GOAL #4   Title  Patient able to fix her hair at PLOF     Status  Partially Met      PT LONG TERM GOAL #5   Title  Patient able to reach to L4 with R arm to ease dressing.    Status  Partially Met            Plan - 12/11/17 1013    Clinical Impression Statement  Que's needed to keep R elbow in during the flexion part of 3 level cabinet reaches. ER/IR restriction with MT has improved some. Good strength and ROM with all of today's exercises overall.     Rehab Potential  Excellent    PT Frequency  3x / week    PT Duration  8 weeks    PT Treatment/Interventions  ADLs/Self Care Home Management;Cryotherapy;Electrical Stimulation;Moist Heat;Ultrasound;Neuromuscular re-education;Therapeutic exercise;Patient/family education;Manual techniques;Vasopneumatic Device;Passive range of motion    PT Next Visit Plan  continue to progress strength and function, IR ROM        Patient will benefit from skilled therapeutic intervention in order to improve the following deficits and impairments:  Decreased range of motion, Impaired UE functional use, Pain, Postural dysfunction, Increased edema, Decreased strength, Abnormal gait  Visit Diagnosis: Stiffness of right shoulder, not elsewhere classified  Muscle weakness (generalized)  Acute pain of right shoulder     Problem List Patient Active Problem List   Diagnosis Date Noted  . Allergic rhinitis 06/23/2015  . Gastro-esophageal reflux disease without esophagitis 06/23/2015  . Hypercholesterolemia without hypertriglyceridemia 06/23/2015  . Benign essential HTN 06/23/2015  . Hypothyroidism 06/23/2015  . Iron deficiency anemia 06/23/2015  . Other seasonal allergic rhinitis 06/23/2015  . Fracture of fibula, left, closed 11/13/2012  . Nondisplaced fracture of fifth right metatarsal bone 11/13/2012    Scot Jun, PTA 12/11/2017, 10:15 AM  [place] [address] [place], [place], [address] Phone: [phone]   Fax:  [phone]  Name: SEMAYA VIDA MRN: [id number] Date of Birth: 01/07/1941

## 2017-12-13 ENCOUNTER — Ambulatory Visit: Payer: Medicare Other | Admitting: Physical Therapy

## 2017-12-13 ENCOUNTER — Encounter: Payer: Self-pay | Admitting: Physical Therapy

## 2017-12-13 DIAGNOSIS — M6281 Muscle weakness (generalized): Secondary | ICD-10-CM | POA: Diagnosis not present

## 2017-12-13 DIAGNOSIS — M25511 Pain in right shoulder: Secondary | ICD-10-CM

## 2017-12-13 DIAGNOSIS — M25611 Stiffness of right shoulder, not elsewhere classified: Secondary | ICD-10-CM | POA: Diagnosis not present

## 2017-12-13 DIAGNOSIS — R1310 Dysphagia, unspecified: Secondary | ICD-10-CM | POA: Diagnosis not present

## 2017-12-13 DIAGNOSIS — R600 Localized edema: Secondary | ICD-10-CM | POA: Diagnosis not present

## 2017-12-13 NOTE — Therapy (Signed)
[address] [place], [place], [address] Phone: [phone]   Fax:  [phone]  Physical Therapy Treatment  [name] MRN: [id number] Date of Birth: [birth year] Referring Provider: Marchia Bond, MD   Encounter Date: 12/13/2017  PT End of Session - 12/13/17 1057    Visit Number  17    Date for PT Re-Evaluation  01/01/18    Authorization Type  KX after visit 15; Progress note at 10th    PT Start Time  1015    PT Stop Time  1113    PT Time Calculation (min)  58 min    Activity Tolerance  Patient tolerated treatment well    Behavior During Therapy  [facility] for tasks assessed/performed       Past Medical History:  Diagnosis Date  . GERD (gastroesophageal reflux disease)   . Hypertension   . Seasonal allergies   . Thyroid disease   . Urgency incontinence     Past Surgical History:  Procedure Laterality Date  . ABDOMINAL HYSTERECTOMY    . CESAREAN SECTION    . CYSTOCELE REPAIR      There were no vitals filed for this visit.  Subjective Assessment - 12/13/17 1022    Subjective  "Feels fine" Reports the normal stretching things out in the morning when she wakes up    Currently in Pain?  No/denies    Pain Score  0-No pain         [facility] PT Assessment - 12/13/17 0001      AROM   Right Shoulder Flexion  181 Degrees    Right Shoulder ABduction  176 Degrees    Right Shoulder Internal Rotation  65 Degrees    Right Shoulder External Rotation  91 Degrees                   [facility] Adult PT Treatment/Exercise - 12/13/17 0001      Shoulder Exercises: Seated   Other Seated Exercises  Hight to low rows 20lb 2x15      Shoulder Exercises: Standing   External Rotation  Theraband;Both;15 reps    Theraband Level (Shoulder External Rotation)  Level 2 (Red)    Internal Rotation  Strengthening;Right;15 reps;Theraband    Theraband Level (Shoulder Internal Rotation)   Level 2 (Red)    Other Standing Exercises  bicep curls 5lb 2 x10, tricept ext 15lb 2x10; $ way scap stabe 1lb x5 each    Other Standing Exercises  3 level cabinet reaches  RUE flex and Abd 2lb x12; arount the world IR RUE red ball 2x10      Shoulder Exercises: ROM/Strengthening   UBE (Upper Arm Bike)  level 3.5 x 6 minutes      Shoulder Exercises: Stretch   Other Shoulder Stretches  IR towell stretch       Shoulder Exercises: Power Warden/ranger Exercises  chest press 5lb 2x15    Other Power Actuary & Lats 20lb 2x10       Vasopneumatic   Number Minutes Vasopneumatic   15 minutes    Vasopnuematic Location   Shoulder    Vasopneumatic Pressure  Medium    Vasopneumatic Temperature   38                  PT Long Term Goals - 12/13/17 1028      PT LONG TERM GOAL #3  Title  Right shoulder ER to 65 deg or better to allow patient to don/doff clothing.    Status  Achieved      PT LONG TERM GOAL #4   Title  Patient able to fix her hair at PLOF     Status  Partially Met            Plan - 12/13/17 1058    Clinical Impression Statement  Pt has progressed meeting all of her ROM goals. She continues to report a pulling sensation wit all IR activities. Some difficulty with dumbbell four way scap stab exercises. Some difficulty reported getting dressed.     Rehab Potential  Excellent    PT Frequency  3x / week    PT Duration  8 weeks    PT Treatment/Interventions  ADLs/Self Care Home Management;Cryotherapy;Electrical Stimulation;Moist Heat;Ultrasound;Neuromuscular re-education;Therapeutic exercise;Patient/family education;Manual techniques;Vasopneumatic Device;Passive range of motion    PT Next Visit Plan  continue to progress strength and function, IR ROM       Patient will benefit from skilled therapeutic intervention in order to improve the following deficits and impairments:  Decreased range of motion, Impaired UE functional use, Pain, Postural  dysfunction, Increased edema, Decreased strength, Abnormal gait  Visit Diagnosis: Muscle weakness (generalized)  Stiffness of right shoulder, not elsewhere classified  Acute pain of right shoulder     Problem List Patient Active Problem List   Diagnosis Date Noted  . Allergic rhinitis 06/23/2015  . Gastro-esophageal reflux disease without esophagitis 06/23/2015  . Hypercholesterolemia without hypertriglyceridemia 06/23/2015  . Benign essential HTN 06/23/2015  . Hypothyroidism 06/23/2015  . Iron deficiency anemia 06/23/2015  . Other seasonal allergic rhinitis 06/23/2015  . Fracture of fibula, left, closed 11/13/2012  . Nondisplaced fracture of fifth right metatarsal bone 11/13/2012    Scot Jun, PTA 12/13/2017, 11:00 AM  [place] [address] [place], [place], [address] Phone: [phone]   Fax:  [phone]  Name: Shelly Pearson MRN: [id number] Date of Birth: May 14, 1941

## 2017-12-15 ENCOUNTER — Ambulatory Visit: Payer: Medicare Other | Admitting: Physical Therapy

## 2017-12-15 ENCOUNTER — Encounter: Payer: Self-pay | Admitting: Physical Therapy

## 2017-12-15 DIAGNOSIS — M6281 Muscle weakness (generalized): Secondary | ICD-10-CM | POA: Diagnosis not present

## 2017-12-15 DIAGNOSIS — R600 Localized edema: Secondary | ICD-10-CM | POA: Diagnosis not present

## 2017-12-15 DIAGNOSIS — R1310 Dysphagia, unspecified: Secondary | ICD-10-CM | POA: Diagnosis not present

## 2017-12-15 DIAGNOSIS — M25611 Stiffness of right shoulder, not elsewhere classified: Secondary | ICD-10-CM | POA: Diagnosis not present

## 2017-12-15 DIAGNOSIS — M25511 Pain in right shoulder: Secondary | ICD-10-CM | POA: Diagnosis not present

## 2017-12-15 NOTE — Therapy (Signed)
[address] [place], [place], [address] Phone: [phone]   Fax:  [phone]  Physical Therapy Treatment  [name] MRN: [id number] Date of Birth: 05-31-1941 Referring Provider: Marchia Bond, MD   Encounter Date: 12/15/2017  PT End of Session - 12/15/17 1055    Visit Number  18    Number of Visits  24    Authorization Type  KX after visit 15; Progress note at 10th    PT Start Time  1015    PT Stop Time  1110    PT Time Calculation (min)  55 min    Activity Tolerance  Patient tolerated treatment well    Behavior During Therapy  [facility] for tasks assessed/performed       Past Medical History:  Diagnosis Date  . GERD (gastroesophageal reflux disease)   . Hypertension   . Seasonal allergies   . Thyroid disease   . Urgency incontinence     Past Surgical History:  Procedure Laterality Date  . ABDOMINAL HYSTERECTOMY    . CESAREAN SECTION    . CYSTOCELE REPAIR      There were no vitals filed for this visit.  Subjective Assessment - 12/15/17 1016    Subjective  "it is going good"    Currently in Pain?  No/denies    Pain Score  0-No pain    Pain Location  Shoulder    Pain Orientation  Right    Pain Descriptors / Indicators  Discomfort                       [facility] Adult PT Treatment/Exercise - 12/15/17 0001      Shoulder Exercises: Supine   Other Supine Exercises  2lb  ER/IR with elbow abducted to 70 degrees      Shoulder Exercises: Seated   Other Seated Exercises  Hight to low rows 25lb 2x10    Other Seated Exercises  Front raises 2lb, bent over rows 2lb, extensions 2lb , reverse flys 1lb  2x10       Shoulder Exercises: Standing   Extension  Both;Theraband;20 reps    Theraband Level (Shoulder Extension)  Level 3 (Green)    Other Standing Exercises  bicep curls 5lb 2 x10, tricept ext 15lb 2x10      Shoulder Exercises: ROM/Strengthening   UBE  (Upper Arm Bike)  level 4 x 6 minutes      Shoulder Exercises: Power Warden/ranger Exercises  chest press 10lb 2x10    Other Power Actuary & Lats 20lb 2x15       Vasopneumatic   Number Minutes Vasopneumatic   15 minutes    Vasopnuematic Location   Shoulder    Vasopneumatic Pressure  Medium    Vasopneumatic Temperature   38      Manual Therapy   Manual Therapy  Passive ROM    Passive ROM  R shoulder ER/IR                  PT Long Term Goals - 12/13/17 1028      PT LONG TERM GOAL #3   Title  Right shoulder ER to 65 deg or better to allow patient to don/doff clothing.    Status  Achieved      PT LONG TERM GOAL #4   Title  Patient able to fix her hair at [facility]  Status  Partially Met            Plan - 12/15/17 1056    Clinical Impression Statement  No issues with today's interventions. Pt does require cues for pacing and making sure she rest between sets, despite reporting muscle fatigue. UE fatigues quick with today's seated bent over exercises.     Rehab Potential  Excellent    PT Treatment/Interventions  ADLs/Self Care Home Management;Cryotherapy;Electrical Stimulation;Moist Heat;Ultrasound;Neuromuscular re-education;Therapeutic exercise;Patient/family education;Manual techniques;Vasopneumatic Device;Passive range of motion    PT Next Visit Plan  continue to progress strength and function, IR ROM       Patient will benefit from skilled therapeutic intervention in order to improve the following deficits and impairments:  Decreased range of motion, Impaired UE functional use, Pain, Postural dysfunction, Increased edema, Decreased strength, Abnormal gait  Visit Diagnosis: Muscle weakness (generalized)  Stiffness of right shoulder, not elsewhere classified  Acute pain of right shoulder  Dysphagia, unspecified type     Problem List Patient Active Problem List   Diagnosis Date Noted  . Allergic rhinitis 06/23/2015  .  Gastro-esophageal reflux disease without esophagitis 06/23/2015  . Hypercholesterolemia without hypertriglyceridemia 06/23/2015  . Benign essential HTN 06/23/2015  . Hypothyroidism 06/23/2015  . Iron deficiency anemia 06/23/2015  . Other seasonal allergic rhinitis 06/23/2015  . Fracture of fibula, left, closed 11/13/2012  . Nondisplaced fracture of fifth right metatarsal bone 11/13/2012    Scot Jun, PTA 12/15/2017, 10:57 AM  [place], [place], [address] Phone: [phone]   Fax:  [phone]  Name: Shelly Pearson MRN: [id number] Date of Birth: [birth year]

## 2017-12-18 ENCOUNTER — Ambulatory Visit: Payer: Medicare Other | Admitting: Physical Therapy

## 2017-12-18 ENCOUNTER — Encounter: Payer: Self-pay | Admitting: Physical Therapy

## 2017-12-18 DIAGNOSIS — M25611 Stiffness of right shoulder, not elsewhere classified: Secondary | ICD-10-CM | POA: Diagnosis not present

## 2017-12-18 DIAGNOSIS — M25511 Pain in right shoulder: Secondary | ICD-10-CM

## 2017-12-18 DIAGNOSIS — R1310 Dysphagia, unspecified: Secondary | ICD-10-CM | POA: Diagnosis not present

## 2017-12-18 DIAGNOSIS — R600 Localized edema: Secondary | ICD-10-CM | POA: Diagnosis not present

## 2017-12-18 DIAGNOSIS — M6281 Muscle weakness (generalized): Secondary | ICD-10-CM | POA: Diagnosis not present

## 2017-12-18 NOTE — Therapy (Signed)
[address] [place], [place], [address] Phone: [phone]   Fax:  [phone]  Physical Therapy Treatment  [name] MRN: [id number] Date of Birth: 03-15-1941 Referring Provider: Marchia Bond, MD   Encounter Date: 12/18/2017  PT End of Session - 12/18/17 0838    Visit Number  19    Date for PT Re-Evaluation  01/01/18    PT Start Time  0752    PT Stop Time  0850    PT Time Calculation (min)  58 min    Activity Tolerance  Patient tolerated treatment well    Behavior During Therapy  [facility] for tasks assessed/performed       Past Medical History:  Diagnosis Date  . GERD (gastroesophageal reflux disease)   . Hypertension   . Seasonal allergies   . Thyroid disease   . Urgency incontinence     Past Surgical History:  Procedure Laterality Date  . ABDOMINAL HYSTERECTOMY    . CESAREAN SECTION    . CYSTOCELE REPAIR      There were no vitals filed for this visit.  Subjective Assessment - 12/18/17 0757    Subjective  I am very pleasaed, I was pretty sore after the last treament, maybe new exercises.      Currently in Pain?  Yes    Pain Score  2     Pain Location  Shoulder    Pain Orientation  Right    Pain Descriptors / Indicators  Sore    Aggravating Factors   some new exercises                       [facility] Adult PT Treatment/Exercise - 12/18/17 0001      Shoulder Exercises: Standing   External Rotation  Theraband;Both;15 reps    Theraband Level (Shoulder External Rotation)  Level 3 (Green)    Internal Rotation  Strengthening;Right;15 reps;Theraband    Theraband Level (Shoulder Internal Rotation)  Level 3 (Green)    Other Standing Exercises  bicep curls 5lb 2 x10, tricept ext 20lb 2x10    Other Standing Exercises  3 level cabinet reaches  RUE flex and Abd 2lb x12; arount the world IR RUE red ball 2x10, 3 # behind back reaching above the waist, 3#  overhead carry      Shoulder Exercises: ROM/Strengthening   UBE (Upper Arm Bike)  level 5 x 6 minutes    Other ROM/Strengthening Exercises  wand exercises with PT overpressure at end range      Shoulder Exercises: Power [employer]   Other Power [employer] Exercises  chest press 10lb 2x10    Other Power Actuary & Lats 25lb 2x15       Shoulder Exercises: Body Blade   Internal Rotation  3 reps;30 seconds      Vasopneumatic   Number Minutes Vasopneumatic   15 minutes    Vasopnuematic Location   Shoulder    Vasopneumatic Pressure  Medium    Vasopneumatic Temperature   33      Manual Therapy   Manual Therapy  Passive ROM    Passive ROM  R shoulder ER/IR             PT Education - 12/18/17 0837    Education provided  Yes    Education Details  gave updated HEP to include two ways to stretch IR, and then theraband strengthening  Person(s) Educated  Patient    Methods  Explanation;Demonstration;Handout;Verbal cues;Tactile cues    Comprehension  Verbalized understanding          PT Long Term Goals - 12/13/17 1028      PT LONG TERM GOAL #3   Title  Right shoulder ER to 65 deg or better to allow patient to don/doff clothing.    Status  Achieved      PT LONG TERM GOAL #4   Title  Patient able to fix her hair at PLOF     Status  Partially Met            Plan - 12/18/17 0838    Clinical Impression Statement  Patient with great ROM, IR is her most limited focused some more on this and gave an updated HEP, shee Sees MD next Wednesday    PT Next Visit Plan  continue to progress strength and function, IR ROM    Consulted and Agree with Plan of Care  Patient       Patient will benefit from skilled therapeutic intervention in order to improve the following deficits and impairments:  Decreased range of motion, Impaired UE functional use, Pain, Postural dysfunction, Increased edema, Decreased strength, Abnormal gait  Visit Diagnosis: Muscle weakness  (generalized)  Stiffness of right shoulder, not elsewhere classified  Acute pain of right shoulder  Localized edema     Problem List Patient Active Problem List   Diagnosis Date Noted  . Allergic rhinitis 06/23/2015  . Gastro-esophageal reflux disease without esophagitis 06/23/2015  . Hypercholesterolemia without hypertriglyceridemia 06/23/2015  . Benign essential HTN 06/23/2015  . Hypothyroidism 06/23/2015  . Iron deficiency anemia 06/23/2015  . Other seasonal allergic rhinitis 06/23/2015  . Fracture of fibula, left, closed 11/13/2012  . Nondisplaced fracture of fifth right metatarsal bone 11/13/2012    Sumner Boast., PT 12/18/2017, 8:40 AM  [address] [place], [place], [address] Phone: [phone]   Fax:  [phone]  Name: THI KLICH MRN: [id number] Date of Birth: [birth year]

## 2017-12-20 ENCOUNTER — Ambulatory Visit: Payer: Medicare Other | Admitting: Physical Therapy

## 2017-12-20 ENCOUNTER — Encounter: Payer: Self-pay | Admitting: Physical Therapy

## 2017-12-20 DIAGNOSIS — M6281 Muscle weakness (generalized): Secondary | ICD-10-CM | POA: Diagnosis not present

## 2017-12-20 DIAGNOSIS — M25511 Pain in right shoulder: Secondary | ICD-10-CM | POA: Diagnosis not present

## 2017-12-20 DIAGNOSIS — M25611 Stiffness of right shoulder, not elsewhere classified: Secondary | ICD-10-CM | POA: Diagnosis not present

## 2017-12-20 DIAGNOSIS — R600 Localized edema: Secondary | ICD-10-CM | POA: Diagnosis not present

## 2017-12-20 DIAGNOSIS — R1310 Dysphagia, unspecified: Secondary | ICD-10-CM | POA: Diagnosis not present

## 2017-12-20 NOTE — Therapy (Signed)
[address] [place], [place], [address] Phone: [phone]   Fax:  [phone]  Physical Therapy Treatment Progress Note Reporting Period 11/28/17 to 12/20/17  For visits 11-20  See note below for Objective Data and Assessment of Progress/Goals.      [name] MRN: [id number] Date of Birth: 05-31-1941 Referring Provider: Marchia Bond, MD   Encounter Date: 12/20/2017  PT End of Session - 12/20/17 1056    Visit Number  20    Number of Visits  24    Date for PT Re-Evaluation  01/01/18    PT Start Time  1013    PT Stop Time  1111    PT Time Calculation (min)  58 min    Activity Tolerance  Patient tolerated treatment well    Behavior During Therapy  [facility] for tasks assessed/performed       Past Medical History:  Diagnosis Date  . GERD (gastroesophageal reflux disease)   . Hypertension   . Seasonal allergies   . Thyroid disease   . Urgency incontinence     Past Surgical History:  Procedure Laterality Date  . ABDOMINAL HYSTERECTOMY    . CESAREAN SECTION    . CYSTOCELE REPAIR      There were no vitals filed for this visit.  Subjective Assessment - 12/20/17 1012    Subjective  "It is going good" She reports that she can feel the new stretch     Pain Score  4     Pain Location  Shoulder    Pain Orientation  Right    Pain Descriptors / Indicators  Sore                       [facility] Adult PT Treatment/Exercise - 12/20/17 0001      Shoulder Exercises: Standing   External Rotation  Theraband;15 reps;Right    Theraband Level (Shoulder External Rotation)  Level 3 (Green)    Internal Rotation  Strengthening;Right;15 reps;Theraband    Theraband Level (Shoulder Internal Rotation)  Level 3 (Green)    Other Standing Exercises  bicep curls 5lb 2 x10, tricept ext 20lb 2x15    Other Standing Exercises  3 level cabinet reaches  RUE flex and Abd 2lb x12; arount  the world IR RUE red ball 2x10, 3 # behind back reaching above the waist, 3# overhead carry      Shoulder Exercises: ROM/Strengthening   UBE (Upper Arm Bike)  level 5 x 6 minutes    Other ROM/Strengthening Exercises  wand exercises with PT overpressure at end range      Shoulder Exercises: Power [employer]   Other Power [employer] Exercises  chest press 10lb 2x10    Other Power Actuary & Lats 25lb 2x15       Vasopneumatic   Number Minutes Vasopneumatic   15 minutes    Vasopnuematic Location   Shoulder    Vasopneumatic Pressure  Medium    Vasopneumatic Temperature   33      Manual Therapy   Manual Therapy  Passive ROM    Passive ROM  R shoulder ER/IR                  PT Long Term Goals - 12/13/17 1028      PT LONG TERM GOAL #3   Title  Right shoulder ER to 65 deg or better to allow patient to  don/doff clothing.    Status  Achieved      PT LONG TERM GOAL #4   Title  Patient able to fix her hair at PLOF     Status  Partially Met            Plan - 12/20/17 1056    Clinical Impression Statement  Pt continues to progress well with strength and ROM, some limitation with R shoulder internal rotation but improving overall. Reports that she can fell the updated stretches in HEP.    Rehab Potential  Excellent    PT Frequency  3x / week    PT Duration  8 weeks    PT Treatment/Interventions  ADLs/Self Care Home Management;Cryotherapy;Electrical Stimulation;Moist Heat;Ultrasound;Neuromuscular re-education;Therapeutic exercise;Patient/family education;Manual techniques;Vasopneumatic Device;Passive range of motion    PT Next Visit Plan  continue to progress strength and function, IR ROM       Patient will benefit from skilled therapeutic intervention in order to improve the following deficits and impairments:  Decreased range of motion, Impaired UE functional use, Pain, Postural dysfunction, Increased edema, Decreased strength, Abnormal gait  Visit Diagnosis: Muscle  weakness (generalized)  Localized edema  Acute pain of right shoulder  Stiffness of right shoulder, not elsewhere classified     Problem List Patient Active Problem List   Diagnosis Date Noted  . Allergic rhinitis 06/23/2015  . Gastro-esophageal reflux disease without esophagitis 06/23/2015  . Hypercholesterolemia without hypertriglyceridemia 06/23/2015  . Benign essential HTN 06/23/2015  . Hypothyroidism 06/23/2015  . Iron deficiency anemia 06/23/2015  . Other seasonal allergic rhinitis 06/23/2015  . Fracture of fibula, left, closed 11/13/2012  . Nondisplaced fracture of fifth right metatarsal bone 11/13/2012    Scot Jun, PTA 12/20/2017, 10:58 AM  [address] [place], [place], [address] Phone: [phone]   Fax:  [phone]  Name: SHAWNI VOLKOV MRN: [id number] Date of Birth: Apr 09, 1941

## 2017-12-22 ENCOUNTER — Encounter: Payer: Self-pay | Admitting: Physical Therapy

## 2017-12-22 ENCOUNTER — Encounter: Payer: PRIVATE HEALTH INSURANCE | Admitting: Physical Therapy

## 2017-12-22 ENCOUNTER — Ambulatory Visit: Payer: Medicare Other | Admitting: Physical Therapy

## 2017-12-22 DIAGNOSIS — M25511 Pain in right shoulder: Secondary | ICD-10-CM

## 2017-12-22 DIAGNOSIS — R1310 Dysphagia, unspecified: Secondary | ICD-10-CM | POA: Diagnosis not present

## 2017-12-22 DIAGNOSIS — M6281 Muscle weakness (generalized): Secondary | ICD-10-CM | POA: Diagnosis not present

## 2017-12-22 DIAGNOSIS — M25611 Stiffness of right shoulder, not elsewhere classified: Secondary | ICD-10-CM | POA: Diagnosis not present

## 2017-12-22 DIAGNOSIS — R600 Localized edema: Secondary | ICD-10-CM | POA: Diagnosis not present

## 2017-12-22 NOTE — Therapy (Signed)
[address] [place], [place], [address] Phone: [phone]   Fax:  [phone]  Physical Therapy Treatment  Patient Details  Name: AZADEH HYDER MRN: [id number] Date of Birth: 1941-02-03 Referring Provider: Marchia Bond, MD   Encounter Date: 12/22/2017  PT End of Session - 12/22/17 0922    Visit Number  21    Date for PT Re-Evaluation  01/01/18    Authorization Type  KX after visit 15; Progress note at 10th    PT Start Time  0841    PT Stop Time  0937    PT Time Calculation (min)  56 min    Activity Tolerance  Patient tolerated treatment well    Behavior During Therapy  [facility] for tasks assessed/performed       Past Medical History:  Diagnosis Date  . GERD (gastroesophageal reflux disease)   . Hypertension   . Seasonal allergies   . Thyroid disease   . Urgency incontinence     Past Surgical History:  Procedure Laterality Date  . ABDOMINAL HYSTERECTOMY    . CESAREAN SECTION    . CYSTOCELE REPAIR      There were no vitals filed for this visit.  Subjective Assessment - 12/22/17 0841    Subjective  "Going good"    Currently in Pain?  Yes    Pain Score  4     Pain Location  Shoulder    Pain Orientation  Right    Pain Descriptors / Indicators  Sore                       [facility] Adult PT Treatment/Exercise - 12/22/17 0001      Shoulder Exercises: Seated   Other Seated Exercises  Hight to low rows 25lb 2x15      Shoulder Exercises: Standing   Other Standing Exercises  bicep curls 5lb 2 x10, tricept ext 20lb 2x15    Other Standing Exercises  3 level cabinet reaches  RUE flex and Abd 3lb x10; arount the world IR RUE red ball 2x10, 3 # behind back reaching above the waist, 3# overhead carry      Shoulder Exercises: ROM/Strengthening   UBE (Upper Arm Bike)  level 5 x 6 minutes    Other ROM/Strengthening Exercises  wand exercises with PT overpressure at end range      Shoulder Exercises: Power [employer]   Other Power [employer] Exercises  chest press 10lb 2x15    Other Power Tower Exercises  Rows & Lats 25lb 2x15       Vasopneumatic   Number Minutes Vasopneumatic   15 minutes    Vasopnuematic Location   Shoulder    Vasopneumatic Pressure  Medium    Vasopneumatic Temperature   33      Manual Therapy   Manual Therapy  Passive ROM    Passive ROM  R shoulder ER/IR                  PT Long Term Goals - 12/13/17 1028      PT LONG TERM GOAL #3   Title  Right shoulder ER to 65 deg or better to allow patient to don/doff clothing.    Status  Achieved      PT LONG TERM GOAL #4   Title  Patient able to fix her hair at PLOF     Status  Partially Met  Plan - 12/22/17 8828    Clinical Impression Statement  No issues with today's interventions, RUE fatigue with the increase weight on 3 level cabinet reaches. R shoulder IR is doing well, was some restrictions and guarding with ER.     Rehab Potential  Excellent    PT Frequency  3x / week    PT Duration  8 weeks    PT Treatment/Interventions  ADLs/Self Care Home Management;Cryotherapy;Electrical Stimulation;Moist Heat;Ultrasound;Neuromuscular re-education;Therapeutic exercise;Patient/family education;Manual techniques;Vasopneumatic Device;Passive range of motion    PT Next Visit Plan  continue to progress strength and function, IR ROM       Patient will benefit from skilled therapeutic intervention in order to improve the following deficits and impairments:  Decreased range of motion, Impaired UE functional use, Pain, Postural dysfunction, Increased edema, Decreased strength, Abnormal gait  Visit Diagnosis: Muscle weakness (generalized)  Localized edema  Acute pain of right shoulder  Stiffness of right shoulder, not elsewhere classified     Problem List Patient Active Problem List   Diagnosis Date Noted  . Allergic rhinitis 06/23/2015  . Gastro-esophageal reflux disease  without esophagitis 06/23/2015  . Hypercholesterolemia without hypertriglyceridemia 06/23/2015  . Benign essential HTN 06/23/2015  . Hypothyroidism 06/23/2015  . Iron deficiency anemia 06/23/2015  . Other seasonal allergic rhinitis 06/23/2015  . Fracture of fibula, left, closed 11/13/2012  . Nondisplaced fracture of fifth right metatarsal bone 11/13/2012    Scot Jun, PTA 12/22/2017, 9:26 AM  [place] [address] [place], [place], [address] Phone: [phone]   Fax:  [phone]  Name: LEXEE BRASHEARS MRN: [id number] Date of Birth: 09/02/1940

## 2017-12-25 ENCOUNTER — Encounter: Payer: Self-pay | Admitting: Physical Therapy

## 2017-12-25 ENCOUNTER — Ambulatory Visit: Payer: Medicare Other | Admitting: Physical Therapy

## 2017-12-25 DIAGNOSIS — M25611 Stiffness of right shoulder, not elsewhere classified: Secondary | ICD-10-CM

## 2017-12-25 DIAGNOSIS — R600 Localized edema: Secondary | ICD-10-CM | POA: Diagnosis not present

## 2017-12-25 DIAGNOSIS — R1310 Dysphagia, unspecified: Secondary | ICD-10-CM | POA: Diagnosis not present

## 2017-12-25 DIAGNOSIS — M25511 Pain in right shoulder: Secondary | ICD-10-CM | POA: Diagnosis not present

## 2017-12-25 DIAGNOSIS — M6281 Muscle weakness (generalized): Secondary | ICD-10-CM

## 2017-12-25 NOTE — Therapy (Signed)
[address] [place], [place], [address] Phone: [phone]   Fax:  [phone]  Physical Therapy Treatment  [name] MRN: [id number] Date of Birth: [birth year] Referring Provider: Marchia Bond, MD   Encounter Date: 12/25/2017  PT End of Session - 12/25/17 0926    Visit Number  22    Date for PT Re-Evaluation  01/01/18    PT Start Time  0845    PT Stop Time  0940    PT Time Calculation (min)  55 min    Activity Tolerance  Patient tolerated treatment well    Behavior During Therapy  [facility] for tasks assessed/performed       Past Medical History:  Diagnosis Date  . GERD (gastroesophageal reflux disease)   . Hypertension   . Seasonal allergies   . Thyroid disease   . Urgency incontinence     Past Surgical History:  Procedure Laterality Date  . ABDOMINAL HYSTERECTOMY    . CESAREAN SECTION    . CYSTOCELE REPAIR      There were no vitals filed for this visit.  Subjective Assessment - 12/25/17 0848    Subjective  "Going fine"    Currently in Pain?  No/denies    Pain Score  0-No pain         [facility] PT Assessment - 12/25/17 0001      ROM / Strength   AROM / PROM / Strength  Strength      AROM   Right/Left Shoulder  Right    Right Shoulder Flexion  180 Degrees    Right Shoulder ABduction  176 Degrees    Right Shoulder Internal Rotation  78 Degrees    Right Shoulder External Rotation  87 Degrees      Strength   Strength Assessment Site  Shoulder    Right/Left Shoulder  Right    Right Shoulder Flexion  4/5    Right Shoulder Extension  4/5    Right Shoulder ABduction  4/5    Right Shoulder Internal Rotation  4/5                   [facility] Adult PT Treatment/Exercise - 12/25/17 0001      Shoulder Exercises: Standing   Other Standing Exercises  bicep curls 5lb 2 x10, tricept ext 20lb 2x15    Other Standing Exercises  3 level cabinet reaches  RUE  flex and Abd 3lb x15, rev grip rows 15lb 2x10       Shoulder Exercises: ROM/Strengthening   UBE (Upper Arm Bike)  level 5 x 6 minutes      Shoulder Exercises: Power Warden/ranger Exercises  chest press 10lb 2x15    Other Power Tower Exercises  Rows & Lats 25lb 2x15       Vasopneumatic   Number Minutes Vasopneumatic   15 minutes    Vasopnuematic Location   Shoulder    Vasopneumatic Pressure  Medium      Manual Therapy   Manual Therapy  Passive ROM    Manual therapy comments  some PROM taken to end range and hels    Passive ROM  R shoulder ER/IR                  PT Long Term Goals - 12/25/17 3267      PT LONG TERM GOAL #1   Title  I with HEP  Status  Achieved      PT LONG TERM GOAL #2   Title  Right shoulder flexioin to 160 degrees or better to normalize ADLS.    Status  Achieved      PT LONG TERM GOAL #3   Title  Right shoulder ER to 65 deg or better to allow patient to don/doff clothing.    Status  Achieved      PT LONG TERM GOAL #4   Title  Patient able to fix her hair at PLOF     Status  Partially Met      PT LONG TERM GOAL #5   Title  Patient able to reach to L4 with R arm to ease dressing.    Status  Achieved      PT LONG TERM GOAL #6   Title  pt to demonstrate 4+/5 or better strength in R shoulder to normalize ADLS    Status  Partially Met            Plan - 12/25/17 0928    Clinical Impression Statement  Most goals met, Still having some difficulty doing her hair at home. Pt R shoulder ROM is good, some tightness with internal and external rotation remains. RUE fatigues quick with flexion and abduction activities.    Rehab Potential  Excellent    PT Treatment/Interventions  ADLs/Self Care Home Management;Cryotherapy;Electrical Stimulation;Moist Heat;Ultrasound;Neuromuscular re-education;Therapeutic exercise;Patient/family education;Manual techniques;Vasopneumatic Device;Passive range of motion    PT Next Visit Plan  MD  appointment Wednesday       Patient will benefit from skilled therapeutic intervention in order to improve the following deficits and impairments:  Decreased range of motion, Impaired UE functional use, Pain, Postural dysfunction, Increased edema, Decreased strength, Abnormal gait  Visit Diagnosis: Muscle weakness (generalized)  Localized edema  Acute pain of right shoulder  Stiffness of right shoulder, not elsewhere classified     Problem List Patient Active Problem List   Diagnosis Date Noted  . Allergic rhinitis 06/23/2015  . Gastro-esophageal reflux disease without esophagitis 06/23/2015  . Hypercholesterolemia without hypertriglyceridemia 06/23/2015  . Benign essential HTN 06/23/2015  . Hypothyroidism 06/23/2015  . Iron deficiency anemia 06/23/2015  . Other seasonal allergic rhinitis 06/23/2015  . Fracture of fibula, left, closed 11/13/2012  . Nondisplaced fracture of fifth right metatarsal bone 11/13/2012    Scot Jun, PTA 12/25/2017, 9:34 AM  [place], [place], [address] Phone: [phone]   Fax:  [phone]  Name: Shelly Pearson MRN: [id number] Date of Birth: 1941-04-16

## 2017-12-27 DIAGNOSIS — S46011D Strain of muscle(s) and tendon(s) of the rotator cuff of right shoulder, subsequent encounter: Secondary | ICD-10-CM | POA: Diagnosis not present

## 2017-12-29 ENCOUNTER — Ambulatory Visit: Payer: Medicare Other | Admitting: Physical Therapy

## 2018-01-08 ENCOUNTER — Telehealth: Payer: Self-pay | Admitting: Family Medicine

## 2018-01-08 MED ORDER — TROSPIUM CHLORIDE 20 MG PO TABS: 20.0000 mg | ORAL_TABLET | Freq: Every day | ORAL | 3 refills | Status: DC

## 2018-01-08 MED ORDER — ATORVASTATIN CALCIUM 20 MG PO TABS: 20.0000 mg | ORAL_TABLET | Freq: Every day | ORAL | 3 refills | Status: DC

## 2018-01-08 NOTE — Telephone Encounter (Signed)
OK to fill

## 2018-01-08 NOTE — Telephone Encounter (Signed)
Copied from [place] [id number]. Topic: Quick Communication - Rx Refill/Question >> Jan 08, 2018  9:43 AM Scherrie Gerlach wrote: Medication: Trospium Chloride (SANCTURA PO) atorvastatin (LIPITOR) 20 MG tablet 90 day  [place], [place] [phone] (Phone) [phone] (Fax)  Dr Rodman Key has never written these scripts for pt, pt transferred from [facility]

## 2018-01-08 NOTE — Telephone Encounter (Signed)
Spoke to pt and informed rx refilled.

## 2018-01-08 NOTE — Telephone Encounter (Signed)
Rx refilled.

## 2018-03-26 ENCOUNTER — Ambulatory Visit: Payer: PRIVATE HEALTH INSURANCE | Admitting: Family Medicine

## 2018-04-17 ENCOUNTER — Ambulatory Visit (INDEPENDENT_AMBULATORY_CARE_PROVIDER_SITE_OTHER): Payer: Medicare Other | Admitting: Family Medicine

## 2018-04-17 ENCOUNTER — Encounter: Payer: Self-pay | Admitting: Family Medicine

## 2018-04-17 VITALS — BP 100/80 | HR 78 | Temp 97.7°F | Ht 64.0 in | Wt 141.2 lb

## 2018-04-17 DIAGNOSIS — Z23 Encounter for immunization: Secondary | ICD-10-CM | POA: Diagnosis not present

## 2018-04-17 DIAGNOSIS — L720 Epidermal cyst: Secondary | ICD-10-CM

## 2018-04-17 NOTE — Patient Instructions (Signed)
Epidermal Cyst An epidermal cyst is sometimes called an epidermal inclusion cyst or an infundibular cyst. It is a sac made of skin tissue. The sac contains a substance called keratin. Keratin is a protein that is normally secreted through the hair follicles. When keratin becomes trapped in the top layer of skin (epidermis), it can form an epidermal cyst. Epidermal cysts are usually found on the face, neck, trunk, and genitals. These cysts are usually harmless (benign), and they may not cause symptoms unless they become infected. It is important not to pop epidermal cysts yourself. What are the causes? This condition may be caused by:  A blocked hair follicle.  A hair that curls and re-enters the skin instead of growing straight out of the skin (ingrown hair).  A blocked pore.  Irritated skin.  An injury to the skin.  Certain conditions that are passed along from parent to child (inherited).  Human papillomavirus (HPV).  What increases the risk? The following factors may make you more likely to develop an epidermal cyst:  Having acne.  Being overweight.  Wearing tight clothing.  What are the signs or symptoms? The only symptom of this condition may be a small, painless lump underneath the skin. When an epidermal cyst becomes infected, symptoms may include:  Redness.  Inflammation.  Tenderness.  Warmth.  Fever.  Keratin draining from the cyst. Keratin may look like a grayish-white, bad-smelling substance.  Pus draining from the cyst.  How is this diagnosed? This condition is diagnosed with a physical exam. In some cases, you may have a sample of tissue (biopsy) taken from your cyst to be examined under a microscope or tested for bacteria. You may be referred to a health care provider who specializes in skin care (dermatologist). How is this treated? In many cases, epidermal cysts go away on their own without treatment. If a cyst becomes infected, treatment may  include:  Opening and draining the cyst. After draining, minor surgery to remove the rest of the cyst may be done.  Antibiotic medicine to help prevent infection.  Injections of medicines (steroids) that help to reduce inflammation.  Surgery to remove the cyst. Surgery may be done if: ? The cyst becomes large. ? The cyst bothers you. ? There is a chance that the cyst could turn into cancer.  Follow these instructions at home:  Take over-the-counter and prescription medicines only as told by your health care provider.  If you were prescribed an antibiotic, use it as told by your health care provider. Do not stop using the antibiotic even if you start to feel better.  Keep the area around your cyst clean and dry.  Wear loose, dry clothing.  Do not try to pop your cyst.  Avoid touching your cyst.  Check your cyst every day for signs of infection.  Keep all follow-up visits as told by your health care provider. This is important. How is this prevented?  Wear clean, dry, clothing.  Avoid wearing tight clothing.  Keep your skin clean and dry. Shower or take baths every day.  Wash your body with a benzoyl peroxide wash when you shower or bathe. Contact a health care provider if:  Your cyst develops symptoms of infection.  Your condition is not improving or is getting worse.  You develop a cyst that looks different from other cysts you have had.  You have a fever. Get help right away if:  Redness spreads from the cyst into the surrounding area. This information is   not intended to replace advice given to you by your health care provider. Make sure you discuss any questions you have with your health care provider. Document Released: 05/21/2004 Document Revised: 02/17/2016 Document Reviewed: 04/22/2015 Elsevier Interactive Patient Education  2018 [employer]  

## 2018-04-17 NOTE — Assessment & Plan Note (Signed)
-  Discussed benign nature of this. -agreeable to observation only at this time. -She will let me know if this is worsening.

## 2018-04-17 NOTE — Progress Notes (Signed)
KEARRA CALKIN - 77 y.o. female MRN [id number]  Date of birth: Nov 03, 1940  Subjective Chief Complaint  Patient presents with  . Cyst    under the skin located by the lip    HPI Shelly Pearson is a  77 y.o. female here today with concern of area on upper lip.  She believes this may be some type of cyst.  This area has been present for several years, no change in overall size however it will get a little bigger on occasion and then return to normal size.  She denies pain associated with this.  There is no drainage or ulceration inside of the lip.  She is without fever or chills.   ROS:  A comprehensive ROS was completed and negative except as noted per HPI  Allergies  Allergen Reactions  . Adhesive  [Tape] Other (See Comments)    Past Medical History:  Diagnosis Date  . GERD (gastroesophageal reflux disease)   . Hypertension   . Seasonal allergies   . Thyroid disease   . Urgency incontinence     Past Surgical History:  Procedure Laterality Date  . ABDOMINAL HYSTERECTOMY    . CESAREAN SECTION    . CYSTOCELE REPAIR      Social History   Socioeconomic History  . Marital status: Married    Spouse name: Not on file  . Number of children: Not on file  . Years of education: Not on file  . Highest education level: Not on file  Occupational History  . Not on file  Social Needs  . Financial resource strain: Not on file  . Food insecurity:    Worry: Not on file    Inability: Not on file  . Transportation needs:    Medical: Not on file    Non-medical: Not on file  Tobacco Use  . Smoking status: Never Smoker  . Smokeless tobacco: Never Used  Substance and Sexual Activity  . Alcohol use: No  . Drug use: No  . Sexual activity: Not on file  Lifestyle  . Physical activity:    Days per week: Not on file    Minutes per session: Not on file  . Stress: Not on file  Relationships  . Social connections:    Talks on phone: Not on file    Gets together: Not on file   Attends religious service: Not on file    Active member of club or organization: Not on file    Attends meetings of clubs or organizations: Not on file    Relationship status: Not on file  Other Topics Concern  . Not on file  Social History Narrative  . Not on file    Family History  Problem Relation Age of Onset  . Stroke Mother   . Cancer Father   . Cancer Sister   . Cancer Brother     Health Maintenance  Topic Date Due  . DEXA SCAN  05/16/2008  . PNA vac Low Risk Adult (2 of 2 - PCV13) 07/08/2011  . INFLUENZA VACCINE  02/01/2018  . MAMMOGRAM  06/06/2019  . TETANUS/TDAP  11/09/2023  . COLONOSCOPY  10/20/2025    ----------------------------------------------------------------------------------------------------------------------------------------------------------------------------------------------------------------- Physical Exam BP 100/80   Pulse 78   Temp 97.7 F (36.5 C)   Ht 5\' 4"  (1.626 m)   Wt 141 lb 3.2 oz (64 kg)   SpO2 98%   BMI 24.24 kg/m   Physical Exam  Constitutional: She is oriented to person, place, and time.  She appears well-nourished. No distress.  HENT:  Head: Normocephalic and atraumatic.  Mouth/Throat: Oropharynx is clear and moist.  Small,  Fairly flat, well circumscribed nodule along upper lip just lateral to philtrum.  Area is non-tender. No lesions to gum or inner lip noted.   Neurological: She is alert and oriented to person, place, and time.  Skin: Skin is warm and dry.  Psychiatric: She has a normal mood and affect. Her behavior is normal.    ------------------------------------------------------------------------------------------------------------------------------------------------------------------------------------------------------------------- Assessment and Plan  Epidermal cyst -Discussed benign nature of this. -agreeable to observation only at this time. -She will let me know if this is worsening.   Prevnar and high  dose influenza vaccine given today.

## 2018-05-17 ENCOUNTER — Encounter: Payer: Self-pay | Admitting: Family Medicine

## 2018-05-17 ENCOUNTER — Ambulatory Visit (INDEPENDENT_AMBULATORY_CARE_PROVIDER_SITE_OTHER): Payer: Medicare Other | Admitting: Family Medicine

## 2018-05-17 VITALS — BP 106/70 | HR 89 | Temp 97.9°F | Wt 139.4 lb

## 2018-05-17 DIAGNOSIS — N644 Mastodynia: Secondary | ICD-10-CM | POA: Diagnosis not present

## 2018-05-17 NOTE — Patient Instructions (Signed)
-  I have placed orders for breast imaging for [facility]

## 2018-05-17 NOTE — Assessment & Plan Note (Signed)
-  No palpable abnormality however given prolonged symptoms and family history I have ordered a diagnostic mammogram and ultrasound of the L breast.  -Differential includes shingles with rash (zoster sine herpete) but I think imaging needs to be completed as well.

## 2018-05-17 NOTE — Progress Notes (Signed)
Shelly Pearson - 77 y.o. female MRN [id number]  Date of birth: 17-Jun-1941  Subjective Chief Complaint  Patient presents with  . Breast Pain    left breast pain    HPI Shelly Pearson is a 77 y.o. female here today with complaint of L breast pain.  She reports that she first noticed this about 2 months ago.  Pain is located in L lateral breast with radiation from nipple into axilla.  She has not palpated any lumps or other abnormalities.  She states that it is sensitive to touch and something as simple as the water running on it in the shower can be painful.  She has not had any rash, redness, fever, chills, back pain, injury or known overuse of surrounding muscles.  Her sister was diagnosed and underwent b/l mastectomy at around age 62 as well.    ROS:  A comprehensive ROS was completed and negative except as noted per HPI  Allergies  Allergen Reactions  . Adhesive  [Tape] Other (See Comments)    Past Medical History:  Diagnosis Date  . GERD (gastroesophageal reflux disease)   . Hypertension   . Seasonal allergies   . Thyroid disease   . Urgency incontinence     Past Surgical History:  Procedure Laterality Date  . ABDOMINAL HYSTERECTOMY    . CESAREAN SECTION    . CYSTOCELE REPAIR      Social History   Socioeconomic History  . Marital status: Married    Spouse name: Not on file  . Number of children: Not on file  . Years of education: Not on file  . Highest education level: Not on file  Occupational History  . Not on file  Social Needs  . Financial resource strain: Not on file  . Food insecurity:    Worry: Not on file    Inability: Not on file  . Transportation needs:    Medical: Not on file    Non-medical: Not on file  Tobacco Use  . Smoking status: Never Smoker  . Smokeless tobacco: Never Used  Substance and Sexual Activity  . Alcohol use: No  . Drug use: No  . Sexual activity: Not on file  Lifestyle  . Physical activity:    Days per week: Not on  file    Minutes per session: Not on file  . Stress: Not on file  Relationships  . Social connections:    Talks on phone: Not on file    Gets together: Not on file    Attends religious service: Not on file    Active member of club or organization: Not on file    Attends meetings of clubs or organizations: Not on file    Relationship status: Not on file  Other Topics Concern  . Not on file  Social History Narrative  . Not on file    Family History  Problem Relation Age of Onset  . Stroke Mother   . Cancer Father   . Cancer Sister   . Cancer Brother     Health Maintenance  Topic Date Due  . DEXA SCAN  05/16/2008  . TETANUS/TDAP  11/09/2023  . COLONOSCOPY  10/20/2025  . INFLUENZA VACCINE  Completed  . PNA vac Low Risk Adult  Completed    ----------------------------------------------------------------------------------------------------------------------------------------------------------------------------------------------------------------- Physical Exam BP 106/70   Pulse 89   Temp 97.9 F (36.6 C) (Oral)   Wt 139 lb 6.4 oz (63.2 kg)   SpO2 98%   BMI 23.93 kg/m  Physical Exam  Constitutional: She is oriented to person, place, and time. She appears well-nourished. No distress.  HENT:  Head: Normocephalic and atraumatic.  Mouth/Throat: Oropharynx is clear and moist.  Pulmonary/Chest: Left breast exhibits tenderness. Left breast exhibits no inverted nipple, no mass, no nipple discharge and no skin change. No breast swelling. Breasts are symmetrical.  Chaperoned by Marlane Mingle, RMA  Lymphadenopathy:    She has no axillary adenopathy.  Neurological: She is alert and oriented to person, place, and time.  Skin: Skin is warm and dry. No rash noted.  Psychiatric: She has a normal mood and affect. Her behavior is normal.     ------------------------------------------------------------------------------------------------------------------------------------------------------------------------------------------------------------------- Assessment and Plan  Breast pain, left -No palpable abnormality however given prolonged symptoms and family history I have ordered a diagnostic mammogram and ultrasound of the L breast.  -Differential includes shingles with rash (zoster sine herpete) but I think imaging needs to be completed as well.

## 2018-06-07 ENCOUNTER — Encounter: Payer: Self-pay | Admitting: Family Medicine

## 2018-06-07 DIAGNOSIS — Z803 Family history of malignant neoplasm of breast: Secondary | ICD-10-CM | POA: Diagnosis not present

## 2018-06-07 DIAGNOSIS — Z780 Asymptomatic menopausal state: Secondary | ICD-10-CM | POA: Diagnosis not present

## 2018-06-07 DIAGNOSIS — N644 Mastodynia: Secondary | ICD-10-CM | POA: Diagnosis not present

## 2018-06-07 DIAGNOSIS — E349 Endocrine disorder, unspecified: Secondary | ICD-10-CM | POA: Diagnosis not present

## 2018-06-07 LAB — HM DEXA SCAN

## 2018-06-11 ENCOUNTER — Other Ambulatory Visit: Payer: Self-pay | Admitting: Family Medicine

## 2018-06-11 MED ORDER — FAMOTIDINE 40 MG PO TABS: 40.0000 mg | ORAL_TABLET | Freq: Every day | ORAL | 1 refills | Status: DC

## 2018-06-11 MED ORDER — LOSARTAN POTASSIUM 25 MG PO TABS: 25.0000 mg | ORAL_TABLET | Freq: Every day | ORAL | 1 refills | Status: DC

## 2018-06-11 NOTE — Telephone Encounter (Signed)
Copied from [place] [id number]. Topic: Quick Communication - Rx Refill/Question >> Jun 11, 2018 10:04 AM Margot Ables wrote: Medication: famotidine 40mg  1/day - requesting 90 day supply  Losartain potassium 25mg  1/day - requesting 90 day supply  Has the patient contacted their pharmacy? Yes - it was sent to previous doctor who denied, pt was advised to call, 1st fill from Dr. Zigmund Daniel being requested  Preferred Pharmacy (with phone number or street name): [facility], [place] [phone] (Phone) [phone] (Fax)

## 2018-06-11 NOTE — Telephone Encounter (Signed)
OK to fill RX?

## 2018-06-11 NOTE — Telephone Encounter (Signed)
Patient informed-RX sent in. 

## 2018-07-24 ENCOUNTER — Encounter: Payer: Self-pay | Admitting: Family Medicine

## 2018-08-01 ENCOUNTER — Ambulatory Visit (INDEPENDENT_AMBULATORY_CARE_PROVIDER_SITE_OTHER): Payer: Medicare Other

## 2018-08-01 ENCOUNTER — Ambulatory Visit (INDEPENDENT_AMBULATORY_CARE_PROVIDER_SITE_OTHER): Payer: Medicare Other | Admitting: Family Medicine

## 2018-08-01 ENCOUNTER — Encounter: Payer: Self-pay | Admitting: Family Medicine

## 2018-08-01 VITALS — BP 112/68 | HR 88 | Temp 98.5°F | Ht 64.0 in | Wt 138.0 lb

## 2018-08-01 DIAGNOSIS — M25551 Pain in right hip: Secondary | ICD-10-CM | POA: Insufficient documentation

## 2018-08-01 DIAGNOSIS — W19XXXA Unspecified fall, initial encounter: Secondary | ICD-10-CM | POA: Diagnosis not present

## 2018-08-01 DIAGNOSIS — S7001XA Contusion of right hip, initial encounter: Secondary | ICD-10-CM | POA: Diagnosis not present

## 2018-08-01 NOTE — Progress Notes (Signed)
Shelly Pearson - 78 y.o. female MRN [id number]  Date of birth: Nov 06, 1940  Subjective Chief Complaint  Patient presents with  . Hip Pain    fell one week ago-landed on her right hip. Brusing present.     HPI Shelly Pearson is a 78 y.o. female here today with hip pain.  She reports that she stumbled and fell while shopping at [employer] last week.  Has some pain and significant bruising along the hip.  She hasn't had a whole lot of pain with weight bearing and has been able to move boxes around as they recently moved.  Pain is worse if squatting down though.  She has not tried icing or medication.  She denies fever, chills.   ROS:  A comprehensive ROS was completed and negative except as noted per HPI.   Allergies  Allergen Reactions  . Adhesive  [Tape] Other (See Comments)    Past Medical History:  Diagnosis Date  . GERD (gastroesophageal reflux disease)   . Hypertension   . Seasonal allergies   . Thyroid disease   . Urgency incontinence     Past Surgical History:  Procedure Laterality Date  . ABDOMINAL HYSTERECTOMY    . CESAREAN SECTION    . CYSTOCELE REPAIR      Social History   Socioeconomic History  . Marital status: Married    Spouse name: Not on file  . Number of children: Not on file  . Years of education: Not on file  . Highest education level: Not on file  Occupational History  . Not on file  Social Needs  . Financial resource strain: Not on file  . Food insecurity:    Worry: Not on file    Inability: Not on file  . Transportation needs:    Medical: Not on file    Non-medical: Not on file  Tobacco Use  . Smoking status: Never Smoker  . Smokeless tobacco: Never Used  Substance and Sexual Activity  . Alcohol use: No  . Drug use: No  . Sexual activity: Not on file  Lifestyle  . Physical activity:    Days per week: Not on file    Minutes per session: Not on file  . Stress: Not on file  Relationships  . Social connections:    Talks on phone: Not  on file    Gets together: Not on file    Attends religious service: Not on file    Active member of club or organization: Not on file    Attends meetings of clubs or organizations: Not on file    Relationship status: Not on file  Other Topics Concern  . Not on file  Social History Narrative  . Not on file    Family History  Problem Relation Age of Onset  . Stroke Mother   . Cancer Father   . Cancer Sister   . Cancer Brother     Health Maintenance  Topic Date Due  . TETANUS/TDAP  11/09/2023  . COLONOSCOPY  10/20/2025  . INFLUENZA VACCINE  Completed  . DEXA SCAN  Completed  . PNA vac Low Risk Adult  Completed    ----------------------------------------------------------------------------------------------------------------------------------------------------------------------------------------------------------------- Physical Exam BP 112/68   Pulse 88   Temp 98.5 F (36.9 C) (Oral)   Ht 5\' 4"  (1.626 m)   Wt 138 lb (62.6 kg)   SpO2 95%   BMI 23.69 kg/m   Physical Exam HENT:     Head: Normocephalic and atraumatic.  Musculoskeletal:  Comments: Bruising along R lateral thigh with ttp and small hematoma overlying greater trochanter.  ROM is normal with pain on internal rotation and flexion   Skin:    General: Skin is warm.  Neurological:     General: No focal deficit present.     Mental Status: She is alert and oriented to person, place, and time.  Psychiatric:        Mood and Affect: Mood normal.        Behavior: Behavior normal.     ------------------------------------------------------------------------------------------------------------------------------------------------------------------------------------------------------------------- Assessment and Plan  Right hip pain -Xrays requested -Discussed icing at home -May use tylenol or ibuprofen as needed for comfort.

## 2018-08-01 NOTE — Patient Instructions (Addendum)
-  We'll be in touch once xray results return.  -Ice area 3-4 times throughout the day -You may use tylenol or ibuprofen as needed for pain

## 2018-08-01 NOTE — Assessment & Plan Note (Signed)
-  Xrays requested -Discussed icing at home -May use tylenol or ibuprofen as needed for comfort.

## 2018-08-13 ENCOUNTER — Telehealth: Payer: Self-pay | Admitting: Family Medicine

## 2018-08-13 NOTE — Telephone Encounter (Signed)
Copied from [place] [id number]. Topic: Quick Communication - Rx Refill/Question >> Aug 13, 2018 10:57 AM Antonieta Iba C wrote: Medication: Levothyroxine 50 MCG Sodium Tablets. Pt says that her directions are to take 1 tablet daily and a half a tablet on Sunday and Wednesday. Pt says that this is a Rx from a previous provider.   Has the patient contacted their pharmacy? Yes- pt says that she was advised to contact PCP for request because it is a new Rx.   (Agent: If no, request that the patient contact the pharmacy for the refill.) (Agent: If yes, when and what did the pharmacy advise?)  Preferred Pharmacy (with phone number or street name): [facility] - Phone: [phone]   Pt says that her Member ID # incase needed is: [id number]  Agent: Please be advised that RX refills may take up to 3 business days. We ask that you follow-up with your pharmacy.

## 2018-08-14 MED ORDER — SYNTHROID 50 MCG PO TABS: 50.0000 ug | ORAL_TABLET | Freq: Every day | ORAL | 1 refills | Status: DC

## 2018-08-14 NOTE — Telephone Encounter (Signed)
RX sent in, patient notified

## 2018-08-23 ENCOUNTER — Other Ambulatory Visit: Payer: Self-pay | Admitting: Family Medicine

## 2018-08-23 MED ORDER — LOSARTAN POTASSIUM 25 MG PO TABS: 25.0000 mg | ORAL_TABLET | Freq: Every day | ORAL | 0 refills | Status: DC

## 2018-08-23 MED ORDER — LEVOTHYROXINE SODIUM 50 MCG PO TABS: 50.0000 ug | ORAL_TABLET | Freq: Every day | ORAL | 3 refills | Status: DC

## 2018-08-23 MED ORDER — TROSPIUM CHLORIDE 20 MG PO TABS: 20.0000 mg | ORAL_TABLET | Freq: Every day | ORAL | 0 refills | Status: DC

## 2018-08-23 MED ORDER — FAMOTIDINE 40 MG PO TABS: 40.0000 mg | ORAL_TABLET | Freq: Every day | ORAL | 0 refills | Status: DC

## 2018-08-23 NOTE — Telephone Encounter (Signed)
Copied from [place] [id number]. Topic: Quick Communication - Rx Refill/Question >> Aug 23, 2018  2:18 PM Sheppard Coil, [employer] L wrote: Medication:  losartan (COZAAR) 25 MG tablet famotidine (PEPCID) 40 MG tablet trospium (SANCTURA) 20 MG tablet  Has the patient contacted their pharmacy? Yes - this is the first time PCP will be filling after she switched doctors (Agent: If no, request that the patient contact the pharmacy for the refill.) (Agent: If yes, when and what did the pharmacy advise?)  Preferred Pharmacy (with phone number or street name): [facility] [place], [place] [facility] [phone] (Phone) [phone] (Fax)  Agent: Please be advised that RX refills may take up to 3 business days. We ask that you follow-up with your pharmacy.

## 2018-08-23 NOTE — Addendum Note (Signed)
Addended by: Verlene Mayer A on: 08/23/2018 04:52 PM   Modules accepted: Orders

## 2018-08-23 NOTE — Telephone Encounter (Signed)
Generic sent in. Left patient a message.

## 2018-08-23 NOTE — Telephone Encounter (Signed)
Pt called and stated that SYNTHROID 50 MCG tablet [[id number]] name brand was sent in. Pt states that this is more expensive. Could the generic be called in. Please advise

## 2018-09-24 ENCOUNTER — Ambulatory Visit: Payer: PRIVATE HEALTH INSURANCE | Admitting: Family Medicine

## 2018-11-19 ENCOUNTER — Ambulatory Visit: Payer: PRIVATE HEALTH INSURANCE | Admitting: Family Medicine

## 2018-11-19 ENCOUNTER — Other Ambulatory Visit: Payer: Self-pay | Admitting: Family Medicine

## 2018-12-05 ENCOUNTER — Encounter: Payer: Self-pay | Admitting: Family Medicine

## 2019-01-23 ENCOUNTER — Encounter: Payer: PRIVATE HEALTH INSURANCE | Admitting: Family Medicine

## 2019-01-30 ENCOUNTER — Ambulatory Visit (INDEPENDENT_AMBULATORY_CARE_PROVIDER_SITE_OTHER): Payer: Medicare Other | Admitting: Family Medicine

## 2019-01-30 ENCOUNTER — Encounter: Payer: Self-pay | Admitting: Family Medicine

## 2019-01-30 VITALS — BP 108/72 | HR 72 | Temp 98.2°F | Resp 16 | Ht 64.0 in | Wt 133.6 lb

## 2019-01-30 DIAGNOSIS — E039 Hypothyroidism, unspecified: Secondary | ICD-10-CM

## 2019-01-30 DIAGNOSIS — Z0000 Encounter for general adult medical examination without abnormal findings: Secondary | ICD-10-CM

## 2019-01-30 DIAGNOSIS — E7800 Pure hypercholesterolemia, unspecified: Secondary | ICD-10-CM

## 2019-01-30 DIAGNOSIS — D508 Other iron deficiency anemias: Secondary | ICD-10-CM | POA: Diagnosis not present

## 2019-01-30 DIAGNOSIS — L309 Dermatitis, unspecified: Secondary | ICD-10-CM

## 2019-01-30 DIAGNOSIS — G245 Blepharospasm: Secondary | ICD-10-CM | POA: Diagnosis not present

## 2019-01-30 DIAGNOSIS — I10 Essential (primary) hypertension: Secondary | ICD-10-CM | POA: Diagnosis not present

## 2019-01-30 DIAGNOSIS — R209 Unspecified disturbances of skin sensation: Secondary | ICD-10-CM

## 2019-01-30 LAB — CBC WITH DIFFERENTIAL/PLATELET
Basophils Absolute: 0 10*3/uL (ref 0.0–0.1)
Basophils Relative: 0.6 % (ref 0.0–3.0)
Eosinophils Absolute: 0.1 10*3/uL (ref 0.0–0.7)
Eosinophils Relative: 3.5 % (ref 0.0–5.0)
HCT: 41.9 % (ref 36.0–46.0)
Hemoglobin: 14 g/dL (ref 12.0–15.0)
Lymphocytes Relative: 32.5 % (ref 12.0–46.0)
Lymphs Abs: 1.2 10*3/uL (ref 0.7–4.0)
MCHC: 33.3 g/dL (ref 30.0–36.0)
MCV: 92.8 fl (ref 78.0–100.0)
Monocytes Absolute: 0.3 10*3/uL (ref 0.1–1.0)
Monocytes Relative: 9.1 % (ref 3.0–12.0)
Neutro Abs: 2 10*3/uL (ref 1.4–7.7)
Neutrophils Relative %: 54.3 % (ref 43.0–77.0)
Platelets: 184 10*3/uL (ref 150.0–400.0)
RBC: 4.52 Mil/uL (ref 3.87–5.11)
RDW: 13 % (ref 11.5–15.5)
WBC: 3.7 10*3/uL — ABNORMAL LOW (ref 4.0–10.5)

## 2019-01-30 LAB — COMPREHENSIVE METABOLIC PANEL
ALT: 19 U/L (ref 0–35)
AST: 17 U/L (ref 0–37)
Albumin: 4.2 g/dL (ref 3.5–5.2)
Alkaline Phosphatase: 62 U/L (ref 39–117)
BUN: 16 mg/dL (ref 6–23)
CO2: 29 mEq/L (ref 19–32)
Calcium: 9.4 mg/dL (ref 8.4–10.5)
Chloride: 107 mEq/L (ref 96–112)
Creatinine, Ser: 0.71 mg/dL (ref 0.40–1.20)
GFR: 80.1 mL/min (ref >60.00)
Glucose, Bld: 105 mg/dL — ABNORMAL HIGH (ref 70–99)
Potassium: 4.5 mEq/L (ref 3.5–5.1)
Sodium: 142 mEq/L (ref 135–145)
Total Bilirubin: 0.6 mg/dL (ref 0.2–1.2)
Total Protein: 6.6 g/dL (ref 6.0–8.3)

## 2019-01-30 LAB — LIPID PANEL
Cholesterol: 160 mg/dL (ref 0–200)
HDL: 63.8 mg/dL (ref >39.00)
LDL Cholesterol: 79 mg/dL (ref 0–99)
NonHDL: 96.24
Total CHOL/HDL Ratio: 3
Triglycerides: 88 mg/dL (ref 0.0–149.0)
VLDL: 17.6 mg/dL (ref 0.0–40.0)

## 2019-01-30 LAB — VITAMIN B12: Vitamin B-12: 277 pg/mL (ref 211–911)

## 2019-01-30 LAB — FERRITIN: Ferritin: 69.3 ng/mL (ref 10.0–291.0)

## 2019-01-30 LAB — TSH: TSH: 0.64 u[IU]/mL (ref 0.35–4.50)

## 2019-01-30 NOTE — Patient Instructions (Addendum)
  Ms. Kindel , Thank you for taking time to come for your Medicare Wellness Visit. I appreciate your ongoing commitment to your health goals. Please review the following plan we discussed and let me know if I can assist you in the future.   Try saline spray in the nose You may use cortisone cream for ear itching.  We'll be in touch with lab results.   These are the goals we discussed: Goals   None     This is a list of the screening recommended for you and due dates:  Health Maintenance  Topic Date Due  . Flu Shot  02/02/2019  . Tetanus Vaccine  11/09/2023  . Colon Cancer Screening  10/20/2025  . DEXA scan (bone density measurement)  Completed  . Pneumonia vaccines  Completed

## 2019-01-30 NOTE — Progress Notes (Signed)
Shelly Pearson - 78 y.o. female MRN [id number]  Date of birth: 06/27/1941   Subjective Chief Complaint  Patient presents with  . Annual Exam    CPE, foot discomfortR>L, eye lid twitchs/itchy R ear cannal    HPI  Shelly Pearson is a 78 y.o. female here today for annual wellness visit.  She has a few concerns today, please see separate note from today regarding these concerns.   Sleep patterns: no sleep issues, feels rested on waking, does not get up to void, gets up 0 times nightly to void and sleeps 7-8 hours nightly.   Home Safety/Smoke Alarms: Feels safe in home. Smoke alarms in place.  Seat Belt Safety: Wears seat belt.   Female:   Pap-Hysterectomy      Mammo-    06/2018 Dexa scan-     06/2018      Allergies  Allergen Reactions  . Adhesive  [Tape] Other (See Comments)    Past Medical History:  Diagnosis Date  . GERD (gastroesophageal reflux disease)   . Hypertension   . Seasonal allergies   . Thyroid disease   . Urgency incontinence     Past Surgical History:  Procedure Laterality Date  . ABDOMINAL HYSTERECTOMY    . CESAREAN SECTION    . CYSTOCELE REPAIR      Social History   Socioeconomic History  . Marital status: Married    Spouse name: Not on file  . Number of children: Not on file  . Years of education: Not on file  . Highest education level: Not on file  Occupational History  . Not on file  Social Needs  . Financial resource strain: Not on file  . Food insecurity    Worry: Not on file    Inability: Not on file  . Transportation needs    Medical: Not on file    Non-medical: Not on file  Tobacco Use  . Smoking status: Never Smoker  . Smokeless tobacco: Never Used  Substance and Sexual Activity  . Alcohol use: No  . Drug use: No  . Sexual activity: Not on file  Lifestyle  . Physical activity    Days per week: Not on file    Minutes per session: Not on file  . Stress: Not on file  Relationships  . Social Herbalist on  phone: Not on file    Gets together: Not on file    Attends religious service: Not on file    Active member of club or organization: Not on file    Attends meetings of clubs or organizations: Not on file    Relationship status: Not on file  Other Topics Concern  . Not on file  Social History Narrative  . Not on file    Family History  Problem Relation Age of Onset  . Stroke Mother   . Cancer Father   . Cancer Sister   . Cancer Brother     Health Maintenance  Topic Date Due  . INFLUENZA VACCINE  02/02/2019  . TETANUS/TDAP  11/09/2023  . COLONOSCOPY  10/20/2025  . DEXA SCAN  Completed  . PNA vac Low Risk Adult  Completed    Depression screen [facility] 2/9 01/30/2019 11/30/2017  Decreased Interest 0 0  Down, Depressed, Hopeless 0 0  PHQ - 2 Score 0 0   Fall Risk  01/30/2019 04/17/2018  Falls in the past year? 0 No  Follow up Falls evaluation completed -  Current Meds  Medication Sig  . atorvastatin (LIPITOR) 20 MG tablet Take 1 tablet (20 mg total) by mouth daily.  Marland Kitchen BIOTIN PO 1 capsule  . Cholecalciferol (VITAMIN D3) 2000 UNITS capsule   . famotidine (PEPCID) 40 MG tablet Take 1 tablet (40 mg total) by mouth daily.  Marland Kitchen levothyroxine (SYNTHROID, LEVOTHROID) 50 MCG tablet Take 1 tablet (50 mcg total) by mouth daily before breakfast.  . losartan (COZAAR) 25 MG tablet Take 1 tablet (25 mg total) by mouth daily.  . trospium (SANCTURA) 20 MG tablet TAKE 1 TABLET AT BEDTIME     ----------------------------------------------------------------------------------------------------------------------------------------------------------------------------------------------------------------- Physical Exam BP 108/72   Pulse 72   Temp 98.2 F (36.8 C) (Oral)   Resp 16   Ht 5\' 4"  (1.626 m)   Wt 133 lb 9.6 oz (60.6 kg)   SpO2 98%   BMI 22.93 kg/m   Care Team: Luetta Nutting, DO-PCP    ------------------------------------------------------------------------------------------------------------------------------------------------------------------------------------------------------------------ Assessment and Plan Counseling provided on upcoming health maintenance items as listed below:  Health Maintenance Due  Topic Date Due  . INFLUENZA VACCINE  02/02/2019    Continue to eat heart healthy diet (full of fruits, vegetables, whole grains, lean protein, water--limit salt, fat, and sugar intake) and increase physical activity as tolerated.  Continue doing brain stimulating activities (puzzles, reading, adult coloring books, staying active) to keep memory sharp.  I have personally reviewed and noted the following in the patient's chart:  Medical and social history  Use of alcohol, tobacco or illicit drugs  Current medications and supplements  Functional ability and status  Nutritional status  Physical activity  Advanced directives  List of other physicians  Hospitalizations, surgeries, and ER visits in previous 12 months  Vitals  Screenings to include cognitive, depression, and falls  Referrals and appointments In addition, I have reviewed and discussed with patient certain preventive protocols, quality metrics, and best practice recommendations. A written personalized care plan for preventive services as well as general preventive health recommendations were provided to patient   Advanced Directives Reviewed and/or information given

## 2019-02-04 ENCOUNTER — Encounter: Payer: Self-pay | Admitting: Family Medicine

## 2019-02-04 DIAGNOSIS — G245 Blepharospasm: Secondary | ICD-10-CM | POA: Insufficient documentation

## 2019-02-04 DIAGNOSIS — R202 Paresthesia of skin: Secondary | ICD-10-CM | POA: Insufficient documentation

## 2019-02-04 DIAGNOSIS — R209 Unspecified disturbances of skin sensation: Secondary | ICD-10-CM | POA: Insufficient documentation

## 2019-02-04 NOTE — Assessment & Plan Note (Signed)
-  Diminished sensation on monofilament exam.  Check B12 and blood glucose today.

## 2019-02-04 NOTE — Assessment & Plan Note (Signed)
Discussed ok to use OTC hydrocortisone as needed.

## 2019-02-04 NOTE — Assessment & Plan Note (Signed)
Update CBC and ferritin.

## 2019-02-04 NOTE — Progress Notes (Signed)
Shelly Pearson - 78 y.o. female MRN [id number]  Date of birth: Nov 03, 1940  Subjective Chief Complaint  Patient presents with  . Annual Exam    CPE, foot discomfortR>L, eye lid twitchs/itchy R ear cannal    HPI Shelly Pearson is a 78 y.o. female here today for AWV she also has the following concerns.  -Foot discomfort:  Mild tingling of feet R>L.  Denies pain or burning.  Blood sugars have historically been well controlled.    -Eye lid twitching: Has had some occasional eyelid twitching. Worse when she is tired.  Admits to increased fatigue and stress related to current events and COVID-19.  Denies blurred/double vision.   -Ear canal itching:  Has had this intermittently for several years.  Told in the past she could use cortisone but never really used regularly because she was concerned about using steroids in the ear.  She denies bleeding or discharge from ear.   Allergies  Allergen Reactions  . Adhesive  [Tape] Other (See Comments)    Past Medical History:  Diagnosis Date  . GERD (gastroesophageal reflux disease)   . Hypertension   . Seasonal allergies   . Thyroid disease   . Urgency incontinence     Past Surgical History:  Procedure Laterality Date  . ABDOMINAL HYSTERECTOMY    . CESAREAN SECTION    . CYSTOCELE REPAIR      Social History   Socioeconomic History  . Marital status: Married    Spouse name: Not on file  . Number of children: Not on file  . Years of education: Not on file  . Highest education level: Not on file  Occupational History  . Not on file  Social Needs  . Financial resource strain: Not on file  . Food insecurity    Worry: Not on file    Inability: Not on file  . Transportation needs    Medical: Not on file    Non-medical: Not on file  Tobacco Use  . Smoking status: Never Smoker  . Smokeless tobacco: Never Used  Substance and Sexual Activity  . Alcohol use: No  . Drug use: No  . Sexual activity: Not on file  Lifestyle  .  Physical activity    Days per week: Not on file    Minutes per session: Not on file  . Stress: Not on file  Relationships  . Social Herbalist on phone: Not on file    Gets together: Not on file    Attends religious service: Not on file    Active member of club or organization: Not on file    Attends meetings of clubs or organizations: Not on file    Relationship status: Not on file  Other Topics Concern  . Not on file  Social History Narrative  . Not on file    Family History  Problem Relation Age of Onset  . Stroke Mother   . Cancer Father   . Cancer Sister   . Cancer Brother     Health Maintenance  Topic Date Due  . INFLUENZA VACCINE  02/02/2019  . TETANUS/TDAP  11/09/2023  . COLONOSCOPY  10/20/2025  . DEXA SCAN  Completed  . PNA vac Low Risk Adult  Completed    ----------------------------------------------------------------------------------------------------------------------------------------------------------------------------------------------------------------- Physical Exam BP 108/72   Pulse 72   Temp 98.2 F (36.8 C) (Oral)   Resp 16   Ht 5\' 4"  (1.626 m)   Wt 133 lb 9.6 oz (60.6 kg)  SpO2 98%   BMI 22.93 kg/m   Physical Exam Constitutional:      Appearance: Normal appearance.  HENT:     Right Ear: Tympanic membrane normal.     Left Ear: Tympanic membrane normal.  Eyes:     General: No scleral icterus. Cardiovascular:     Rate and Rhythm: Normal rate and regular rhythm.     Pulses:          Dorsalis pedis pulses are 2+ on the right side and 2+ on the left side.       Posterior tibial pulses are 2+ on the right side and 2+ on the left side.  Pulmonary:     Effort: Pulmonary effort is normal.     Breath sounds: Normal breath sounds.  Abdominal:     General: Abdomen is flat.  Musculoskeletal:     Right foot: Normal range of motion. No deformity.     Left foot: Normal range of motion. No deformity.  Feet:     Right foot:      Protective Sensation: 4 sites tested. 2 sites sensed.     Skin integrity: No ulcer, blister, skin breakdown, erythema, warmth, callus or dry skin.     Left foot:     Protective Sensation: 4 sites tested. 2 sites sensed.     Skin integrity: No ulcer, blister, skin breakdown, erythema, warmth, callus or dry skin.  Skin:    General: Skin is warm and dry.     Findings: No rash.  Neurological:     General: No focal deficit present.     Mental Status: She is alert.  Psychiatric:        Mood and Affect: Mood normal.        Behavior: Behavior normal.     ------------------------------------------------------------------------------------------------------------------------------------------------------------------------------------------------------------------- Assessment and Plan  Disturbance of skin sensation -Diminished sensation on monofilament exam.  Check B12 and blood glucose today.   Hypercholesterolemia without hypertriglyceridemia Update lipid panel today.   Iron deficiency anemia Update CBC and ferritin.    Eye twitch Likely related to stress/fatigue.  Will update TSH as well.   Eczema Discussed ok to use OTC hydrocortisone as needed.

## 2019-02-04 NOTE — Assessment & Plan Note (Signed)
Update lipid panel today 

## 2019-02-04 NOTE — Assessment & Plan Note (Addendum)
Likely related to stress/fatigue.  Will update TSH as well.

## 2019-02-05 ENCOUNTER — Other Ambulatory Visit: Payer: Self-pay

## 2019-02-05 MED ORDER — TROSPIUM CHLORIDE 20 MG PO TABS: 20.0000 mg | ORAL_TABLET | Freq: Every day | ORAL | 0 refills | Status: DC

## 2019-02-05 MED ORDER — LOSARTAN POTASSIUM 25 MG PO TABS: 25.0000 mg | ORAL_TABLET | Freq: Every day | ORAL | 0 refills | Status: DC

## 2019-02-05 MED ORDER — FAMOTIDINE 40 MG PO TABS: 40.0000 mg | ORAL_TABLET | Freq: Every day | ORAL | 0 refills | Status: DC

## 2019-02-05 NOTE — Progress Notes (Signed)
Overall labs look good.  Cholesterol, iron levels and blood sugar are all normal -B12 is on the low end of normal.  I would recommend starting a supplement of 500-1068mcg B12 daily.

## 2019-02-26 ENCOUNTER — Other Ambulatory Visit: Payer: Self-pay | Admitting: Family Medicine

## 2019-03-26 ENCOUNTER — Ambulatory Visit (INDEPENDENT_AMBULATORY_CARE_PROVIDER_SITE_OTHER): Payer: Medicare Other | Admitting: Behavioral Health

## 2019-03-26 ENCOUNTER — Other Ambulatory Visit: Payer: Self-pay

## 2019-03-26 DIAGNOSIS — Z23 Encounter for immunization: Secondary | ICD-10-CM | POA: Diagnosis not present

## 2019-03-26 NOTE — Progress Notes (Signed)
Patient presents in clinic today for Influenza vaccination. IM injection was given in the left deltoid. Patient tolerated the injection well. No signs or symptoms of a reaction were noted prior to patient leaving the nurse visit. 

## 2019-04-26 ENCOUNTER — Other Ambulatory Visit: Payer: Self-pay

## 2019-04-26 ENCOUNTER — Ambulatory Visit (INDEPENDENT_AMBULATORY_CARE_PROVIDER_SITE_OTHER): Payer: Medicare Other | Admitting: Family Medicine

## 2019-04-26 ENCOUNTER — Encounter: Payer: Self-pay | Admitting: Family Medicine

## 2019-04-26 DIAGNOSIS — M5431 Sciatica, right side: Secondary | ICD-10-CM

## 2019-04-26 DIAGNOSIS — M5430 Sciatica, unspecified side: Secondary | ICD-10-CM | POA: Insufficient documentation

## 2019-04-26 MED ORDER — METHYLPREDNISOLONE 4 MG PO TBPK: ORAL_TABLET | ORAL | 0 refills | Status: DC

## 2019-04-26 NOTE — Progress Notes (Signed)
Shelly Pearson - 78 y.o. female MRN [id number]  Date of birth: 1940/09/11  Subjective Chief Complaint  Patient presents with  . Sciatica    HPI Shelly Pearson is a 78 y.o. female here today with complaint of buttock pain with radiation down back of leg and into lateral ankle area.  Her symptoms started a few days ago.  Symptoms are made worse by sitting and improved with standing and walking around.  She denies weakness of the leg, swelling, low back pain, fever or chills.  She does not recall any injury or known overuse.  She has had mild episodes of sciatica in the past.   ROS:  A comprehensive ROS was completed and negative except as noted per HPI  Allergies  Allergen Reactions  . Adhesive  [Tape] Other (See Comments)    Past Medical History:  Diagnosis Date  . GERD (gastroesophageal reflux disease)   . Hypertension   . Seasonal allergies   . Thyroid disease   . Urgency incontinence     Past Surgical History:  Procedure Laterality Date  . ABDOMINAL HYSTERECTOMY    . CESAREAN SECTION    . CYSTOCELE REPAIR      Social History   Socioeconomic History  . Marital status: Married    Spouse name: Not on file  . Number of children: Not on file  . Years of education: Not on file  . Highest education level: Not on file  Occupational History  . Not on file  Social Needs  . Financial resource strain: Not on file  . Food insecurity    Worry: Not on file    Inability: Not on file  . Transportation needs    Medical: Not on file    Non-medical: Not on file  Tobacco Use  . Smoking status: Never Smoker  . Smokeless tobacco: Never Used  Substance and Sexual Activity  . Alcohol use: No  . Drug use: No  . Sexual activity: Not on file  Lifestyle  . Physical activity    Days per week: Not on file    Minutes per session: Not on file  . Stress: Not on file  Relationships  . Social Herbalist on phone: Not on file    Gets together: Not on file    Attends  religious service: Not on file    Active member of club or organization: Not on file    Attends meetings of clubs or organizations: Not on file    Relationship status: Not on file  Other Topics Concern  . Not on file  Social History Narrative  . Not on file    Family History  Problem Relation Age of Onset  . Stroke Mother   . Cancer Father   . Cancer Sister   . Cancer Brother     Health Maintenance  Topic Date Due  . TETANUS/TDAP  11/09/2023  . COLONOSCOPY  10/20/2025  . INFLUENZA VACCINE  Completed  . DEXA SCAN  Completed  . PNA vac Low Risk Adult  Completed    ----------------------------------------------------------------------------------------------------------------------------------------------------------------------------------------------------------------- Physical Exam BP 120/80   Pulse 95   Wt 133 lb 12.8 oz (60.7 kg)   SpO2 97%   BMI 22.97 kg/m   Physical Exam Constitutional:      Appearance: Normal appearance.  HENT:     Head: Normocephalic and atraumatic.  Neck:     Musculoskeletal: Neck supple.  Cardiovascular:     Rate and Rhythm: Normal rate and  regular rhythm.  Pulmonary:     Effort: Pulmonary effort is normal.     Breath sounds: Normal breath sounds.  Musculoskeletal:     Comments: ROM of Lumbar spine is normal. SLR positive with increased radiation into lower leg.  DTR's and strength is normal.    Skin:    General: Skin is warm and dry.  Neurological:     General: No focal deficit present.     Mental Status: She is alert.  Psychiatric:        Mood and Affect: Mood normal.        Behavior: Behavior normal.     ------------------------------------------------------------------------------------------------------------------------------------------------------------------------------------------------------------------- Assessment and Plan  Sciatica -Denies associated back pain, ? Piriformis syndrome based on current symptoms.   -Will provide trial of methylprednisolone -Discussed stretches to try at home.  -Recommend icing if sitting (can use frozen corn/peas) -Call for new or worsening symptoms.

## 2019-04-26 NOTE — Patient Instructions (Signed)
Piriformis Syndrome Rehab Ask your health care provider which exercises are safe for you. Do exercises exactly as told by your health care provider and adjust them as directed. It is normal to feel mild stretching, pulling, tightness, or discomfort as you do these exercises. Stop right away if you feel sudden pain or your pain gets worse. Do not begin these exercises until told by your health care provider. Stretching and range-of-motion exercises These exercises warm up your muscles and joints and improve the movement and flexibility of your hip and pelvis. The exercises also help to relieve pain, numbness, and tingling. Hip rotation This is an exercise in which you lie on your back and stretch the muscles that rotate your hip (hip rotators) to stretch your buttocks. 1. Lie on your back on a firm surface. 2. Pull your left / right knee toward your same shoulder with your left / right hand until your knee is pointing toward the ceiling. Hold your left / right ankle with your other hand. 3. Keeping your knee steady, gently pull your left / right ankle toward your other shoulder until you feel a stretch in your buttocks. 4. Hold this position for __________ seconds. Repeat __________ times. Complete this exercise __________ times a day. Hip extensor This is an exercise in which you lie on your back and pull your knee to your chest. 1. Lie on your back on a firm surface. Both of your legs should be straight. 2. Pull your left / right knee to your chest. Hold your leg in this position by holding onto the back of your thigh or the front of your knee. 3. Hold this position for __________ seconds. 4. Slowly return to the starting position. Repeat __________ times. Complete this exercise __________ times a day. Strengthening exercises These exercises build strength and endurance in your hip and thigh muscles. Endurance is the ability to use your muscles for a long time, even after they get tired.  Straight leg raises, side-lying This exercise strengthens the muscles that rotate the leg at the hip and move it away from your body (hip abductors). 1. Lie on your side with your left / right leg in the top position. Lie so your head, shoulder, knee, and hip line up. Bend your bottom knee to help you balance. 2. Lift your top leg 4-6 inches (10-15 cm) while keeping your toes pointed straight ahead. 3. Hold this position for __________ seconds. 4. Slowly lower your leg to the starting position. 5. Let your muscles relax completely after each repetition. Repeat __________ times. Complete this exercise __________ times a day. Hip abduction and rotation This is sometimes called quadruped (on hands and knees) exercises. 1. Get on your hands and knees on a firm, lightly padded surface. Your hands should be directly below your shoulders, and your knees should be directly below your hips. 2. Lift your left / right knee out to the side. Keep your knee bent. Do not twist your body. 3. Hold this position for __________ seconds. 4. Slowly lower your leg. Repeat __________ times. Complete this exercise __________ times a day. Straight leg raises, face-down This exercise stretches the muscles that move your hips away from the front of the pelvis (hip extensors). 1. Lie on your abdomen on a bed or a firm surface with a pillow under your hips. 2. Squeeze your buttocks muscles and lift your left / right leg about 4-6 inches (10-15 cm) off the bed. Do not let your back arch. 3. Hold   this position for __________ seconds. 4. Slowly lower your leg to the starting position. 5. Let your muscles relax completely after each repetition. Repeat __________ times. Complete this exercise __________ times a day. This information is not intended to replace advice given to you by your health care provider. Make sure you discuss any questions you have with your health care provider. Document Released: 06/20/2005 Document  Revised: 10/11/2018 Document Reviewed: 04/12/2018 Elsevier Patient Education  2020 [employer]  

## 2019-04-26 NOTE — Assessment & Plan Note (Signed)
-  Denies associated back pain, ? Piriformis syndrome based on current symptoms.  -Will provide trial of methylprednisolone -Discussed stretches to try at home.  -Recommend icing if sitting (can use frozen corn/peas)

## 2019-05-16 ENCOUNTER — Telehealth: Payer: Self-pay

## 2019-05-16 NOTE — Telephone Encounter (Signed)
Questions for Screening COVID-19    Travel or Contacts: no  During this illness, did/does the patient experience any of the following symptoms? Fever >100.15F []   Yes [x]   No []   Unknown Subjective fever (felt feverish) []   Yes [x]   No []   Unknown Chills []   Yes [x]   No []   Unknown Muscle aches (myalgia) []   Yes [x]   No []   Unknown Runny nose (rhinorrhea) []   Yes [x]   No []   Unknown Sore throat []   Yes [x]   No []   Unknown Cough (new onset or worsening of chronic cough) []   Yes [x]   No []   Unknown Shortness of breath (dyspnea) []   Yes [x]   No []   Unknown Nausea or vomiting []   Yes [x]   No []   Unknown Headache []   Yes [x]   No []   Unknown Abdominal pain  []   Yes [x]   No []   Unknown Diarrhea (?3 loose/looser than normal stools/24hr period) []   Yes [x]   No []   Unknown

## 2019-05-17 ENCOUNTER — Ambulatory Visit (INDEPENDENT_AMBULATORY_CARE_PROVIDER_SITE_OTHER): Payer: Medicare Other | Admitting: Family Medicine

## 2019-05-17 ENCOUNTER — Encounter: Payer: Self-pay | Admitting: Family Medicine

## 2019-05-17 VITALS — BP 118/70 | HR 80 | Temp 97.7°F | Ht 64.0 in | Wt 134.8 lb

## 2019-05-17 DIAGNOSIS — M5431 Sciatica, right side: Secondary | ICD-10-CM

## 2019-05-17 MED ORDER — BACLOFEN 10 MG PO TABS: 10.0000 mg | ORAL_TABLET | Freq: Three times a day (TID) | ORAL | 0 refills | Status: DC | PRN

## 2019-05-17 MED ORDER — MELOXICAM 7.5 MG PO TABS: 7.5000 mg | ORAL_TABLET | Freq: Every day | ORAL | 0 refills | Status: DC

## 2019-05-17 NOTE — Progress Notes (Signed)
Shelly Pearson - 78 y.o. female MRN [id number]  Date of birth: 01/11/1941  Subjective Chief Complaint  Patient presents with  . Leg Pain    pt is having upper back pain near right shoulder blade, pt is still having right leg pain radiates to ankle and a pitching sensation in right buttocks. pt said its been hard to sleep.    HPI Shelly Pearson is a 78 y.o. female here today for follow up of sciatica.  She reports that she continues to have sciatica on the R side, radiating from top of buttocks down to ankle.  Leg feels weak on the R side and it is affecting her gait.  She tried steroid and stretches and hasn't really had any improvement.  She did try aleve which provides temporary relief.  She denies falls.  She has also had pain in R shoulder blade that started a few days ago.  She denies radiation of this pain.   ROS:  A comprehensive ROS was completed and negative except as noted per HPI  Allergies  Allergen Reactions  . Adhesive  [Tape] Other (See Comments)    Past Medical History:  Diagnosis Date  . GERD (gastroesophageal reflux disease)   . Hypertension   . Seasonal allergies   . Thyroid disease   . Urgency incontinence     Past Surgical History:  Procedure Laterality Date  . ABDOMINAL HYSTERECTOMY    . CESAREAN SECTION    . CYSTOCELE REPAIR    . ROTATOR CUFF REPAIR Right    2018    Social History   Socioeconomic History  . Marital status: Married    Spouse name: Not on file  . Number of children: Not on file  . Years of education: Not on file  . Highest education level: Not on file  Occupational History  . Not on file  Social Needs  . Financial resource strain: Not on file  . Food insecurity    Worry: Not on file    Inability: Not on file  . Transportation needs    Medical: Not on file    Non-medical: Not on file  Tobacco Use  . Smoking status: Never Smoker  . Smokeless tobacco: Never Used  Substance and Sexual Activity  . Alcohol use: No  .  Drug use: No  . Sexual activity: Not on file  Lifestyle  . Physical activity    Days per week: Not on file    Minutes per session: Not on file  . Stress: Not on file  Relationships  . Social Herbalist on phone: Not on file    Gets together: Not on file    Attends religious service: Not on file    Active member of club or organization: Not on file    Attends meetings of clubs or organizations: Not on file    Relationship status: Not on file  Other Topics Concern  . Not on file  Social History Narrative  . Not on file    Family History  Problem Relation Age of Onset  . Stroke Mother   . Cancer Father   . Cancer Sister   . Cancer Brother     Health Maintenance  Topic Date Due  . TETANUS/TDAP  11/09/2023  . INFLUENZA VACCINE  Completed  . DEXA SCAN  Completed  . PNA vac Low Risk Adult  Completed    ----------------------------------------------------------------------------------------------------------------------------------------------------------------------------------------------------------------- Physical Exam BP 118/70   Pulse 80   Temp 97.7  F (36.5 C) (Temporal)   Ht 5\' 4"  (1.626 m)   Wt 134 lb 12.8 oz (61.1 kg)   SpO2 97%   BMI 23.14 kg/m   Physical Exam Constitutional:      Appearance: Normal appearance.  HENT:     Head: Normocephalic and atraumatic.  Eyes:     General: No scleral icterus. Cardiovascular:     Rate and Rhythm: Normal rate and regular rhythm.  Pulmonary:     Effort: Pulmonary effort is normal.     Breath sounds: Normal breath sounds.  Musculoskeletal:     Comments: Strength 4/5 on R compared to 5/5 on L.  DTR 2+ bilateral.  SLR with increased pain down R leg.  Gait is antalgic on R  Skin:    General: Skin is warm and dry.  Neurological:     General: No focal deficit present.     Mental Status: She is alert.  Psychiatric:        Mood and Affect: Mood normal.        Behavior: Behavior normal.      ------------------------------------------------------------------------------------------------------------------------------------------------------------------------------------------------------------------- Assessment and Plan  Sciatica -Has improvement with NSAID at home but only temporary, will try meloxicam as it is longer acting.  Add baclofen for this and shoulder blade pain.  Shoulder blade pain is likely due to postural/gait changes.  -Referral to PT.  -Discussed if this persists will plan to order MRI

## 2019-05-17 NOTE — Assessment & Plan Note (Signed)
-  Has improvement with NSAID at home but only temporary, will try meloxicam as it is longer acting.  Add baclofen for this and shoulder blade pain.  Shoulder blade pain is likely due to postural/gait changes.  -Referral to PT.  -Discussed if this persists will plan to order MRI

## 2019-05-17 NOTE — Patient Instructions (Signed)
Sciatica ° °Sciatica is pain, weakness, tingling, or loss of feeling (numbness) along the sciatic nerve. The sciatic nerve starts in the lower back and goes down the back of each leg. Sciatica usually goes away on its own or with treatment. Sometimes, sciatica may come back (recur). °What are the causes? °This condition happens when the sciatic nerve is pinched or has pressure put on it. This may be the result of: °· A disk in between the bones of the spine bulging out too far (herniated disk). °· Changes in the spinal disks that occur with aging. °· A condition that affects a muscle in the butt. °· Extra bone growth near the sciatic nerve. °· A break (fracture) of the area between your hip bones (pelvis). °· Pregnancy. °· Tumor. This is rare. °What increases the risk? °You are more likely to develop this condition if you: °· Play sports that put pressure or stress on the spine. °· Have poor strength and ease of movement (flexibility). °· Have had a back injury in the past. °· Have had back surgery. °· Sit for long periods of time. °· Do activities that involve bending or lifting over and over again. °· Are very overweight (obese). °What are the signs or symptoms? °Symptoms can vary from mild to very bad. They may include: °· Any of these problems in the lower back, leg, hip, or butt: °? Mild tingling, loss of feeling, or dull aches. °? Burning sensations. °? Sharp pains. °· Loss of feeling in the back of the calf or the sole of the foot. °· Leg weakness. °· Very bad back pain that makes it hard to move. °These symptoms may get worse when you cough, sneeze, or laugh. They may also get worse when you sit or stand for long periods of time. °How is this treated? °This condition often gets better without any treatment. However, treatment may include: °· Changing or cutting back on physical activity when you have pain. °· Doing exercises and stretching. °· Putting ice or heat on the affected area. °· Medicines that  help: °? To relieve pain and swelling. °? To relax your muscles. °· Shots (injections) of medicines that help to relieve pain, irritation, and swelling. °· Surgery. °Follow these instructions at home: °Medicines °· Take over-the-counter and prescription medicines only as told by your doctor. °· Ask your doctor if the medicine prescribed to you: °? Requires you to avoid driving or using heavy machinery. °? Can cause trouble pooping (constipation). You may need to take these steps to prevent or treat trouble pooping: °§ Drink enough fluids to keep your pee (urine) pale yellow. °§ Take over-the-counter or prescription medicines. °§ Eat foods that are high in fiber. These include beans, whole grains, and fresh fruits and vegetables. °§ Limit foods that are high in fat and sugar. These include fried or sweet foods. °Managing pain ° °  ° °· If told, put ice on the affected area. °? Put ice in a plastic bag. °? Place a towel between your skin and the bag. °? Leave the ice on for 20 minutes, 2-3 times a day. °· If told, put heat on the affected area. Use the heat source that your doctor tells you to use, such as a moist heat pack or a heating pad. °? Place a towel between your skin and the heat source. °? Leave the heat on for 20-30 minutes. °? Remove the heat if your skin turns bright red. This is very important if you are   unable to feel pain, heat, or cold. You may have a greater risk of getting burned. °Activity ° °· Return to your normal activities as told by your doctor. Ask your doctor what activities are safe for you. °· Avoid activities that make your symptoms worse. °· Take short rests during the day. °? When you rest for a long time, do some physical activity or stretching between periods of rest. °? Avoid sitting for a long time without moving. Get up and move around at least one time each hour. °· Exercise and stretch regularly, as told by your doctor. °· Do not lift anything that is heavier than 10 lb (4.5 kg)  while you have symptoms of sciatica. °? Avoid lifting heavy things even when you do not have symptoms. °? Avoid lifting heavy things over and over. °· When you lift objects, always lift in a way that is safe for your body. To do this, you should: °? Bend your knees. °? Keep the object close to your body. °? Avoid twisting. °General instructions °· Stay at a healthy weight. °· Wear comfortable shoes that support your feet. Avoid wearing high heels. °· Avoid sleeping on a mattress that is too soft or too hard. You might have less pain if you sleep on a mattress that is firm enough to support your back. °· Keep all follow-up visits as told by your doctor. This is important. °Contact a doctor if: °· You have pain that: °? Wakes you up when you are sleeping. °? Gets worse when you lie down. °? Is worse than the pain you have had in the past. °? Lasts longer than 4 weeks. °· You lose weight without trying. °Get help right away if: °· You cannot control when you pee (urinate) or poop (have a bowel movement). °· You have weakness in any of these areas and it gets worse: °? Lower back. °? The area between your hip bones. °? Butt. °? Legs. °· You have redness or swelling of your back. °· You have a burning feeling when you pee. °Summary °· Sciatica is pain, weakness, tingling, or loss of feeling (numbness) along the sciatic nerve. °· This condition happens when the sciatic nerve is pinched or has pressure put on it. °· Sciatica can cause pain, tingling, or loss of feeling (numbness) in the lower back, legs, hips, and butt. °· Treatment often includes rest, exercise, medicines, and putting ice or heat on the affected area. °This information is not intended to replace advice given to you by your health care provider. Make sure you discuss any questions you have with your health care provider. °Document Released: 03/29/2008 Document Revised: 07/09/2018 Document Reviewed: 07/09/2018 °Elsevier Patient Education © 2020 [employer] ° °

## 2019-05-28 ENCOUNTER — Ambulatory Visit: Payer: Medicare Other | Admitting: Physical Therapy

## 2019-05-28 ENCOUNTER — Encounter: Payer: Self-pay | Admitting: Physical Therapy

## 2019-05-28 ENCOUNTER — Ambulatory Visit: Payer: Medicare Other | Attending: Family Medicine | Admitting: Physical Therapy

## 2019-05-28 ENCOUNTER — Other Ambulatory Visit: Payer: Self-pay

## 2019-05-28 DIAGNOSIS — M62830 Muscle spasm of back: Secondary | ICD-10-CM | POA: Insufficient documentation

## 2019-05-28 DIAGNOSIS — M5441 Lumbago with sciatica, right side: Secondary | ICD-10-CM | POA: Diagnosis not present

## 2019-05-28 DIAGNOSIS — R262 Difficulty in walking, not elsewhere classified: Secondary | ICD-10-CM

## 2019-05-28 NOTE — Therapy (Signed)
[facility] [address] [place], [place], [address] Phone: [phone]   Fax:  [phone]  Physical Therapy Evaluation  [name] MRN: [id number] Date of Birth: 1940-12-28 Referring Provider (PT): Gilda Crease   Encounter Date: 05/28/2019  PT End of Session - 05/28/19 1705    Visit Number  1    Date for PT Re-Evaluation  07/28/19    PT Start Time  [id number]    PT Stop Time  1704    PT Time Calculation (min)  50 min    Activity Tolerance  Patient tolerated treatment well    Behavior During Therapy  [facility] for tasks assessed/performed       Past Medical History:  Diagnosis Date  . GERD (gastroesophageal reflux disease)   . Hypertension   . Seasonal allergies   . Thyroid disease   . Urgency incontinence     Past Surgical History:  Procedure Laterality Date  . ABDOMINAL HYSTERECTOMY    . CESAREAN SECTION    . CYSTOCELE REPAIR    . ROTATOR CUFF REPAIR Right    2018    There were no vitals filed for this visit.   Subjective Assessment - 05/28/19 1617    Subjective  Patient reports that she has right buttock and tight lower leg pain starting about 4 weeks ago, she is unsure of a cause.  No imaging has been performed.  REports very difficulty walking    Pertinent History  HTN, GERD    Limitations  Walking;Standing;House hold activities    Patient Stated Goals  have no pain, return to walking    Currently in Pain?  Yes    Pain Score  4     Pain Location  Buttocks    Pain Orientation  Right    Pain Descriptors / Indicators  Aching;Tingling    Pain Type  Acute pain    Pain Radiating Towards  pain is in the buttock and into the right lateral shina nd ankle    Pain Onset  More than a month ago    Pain Frequency  Constant    Aggravating Factors   worse with walking and as the day goes on.  any lifting or carrying items will increase pain up to 9/10    Pain Relieving Factors  taking  pain medication helps some, at best pain is down to 2/10, better first thing in the morning    Effect of Pain on Daily Activities  limits all ADL's.  difficulty walking, doing household activities         [facility] PT Assessment - 05/28/19 0001      Assessment   Medical Diagnosis  LBP with right sciatica    Referring Provider (PT)  C. Matthews    Onset Date/Surgical Date  04/27/19    Prior Therapy  for shoulder       Precautions   Precautions  None      Balance Screen   Has the patient fallen in the past 6 months  No    Has the patient had a decrease in activity level because of a fear of falling?   No    Is the patient reluctant to leave their home because of a fear of falling?   No      Home Environment   Additional Comments  has some stairs at home has to go one at a time, normally does housework and some gardening  Prior Function   Level of Independence  Independent    Vocation  Retired    Biomedical scientist  some volunteering in [employer]    Leisure  was walking about 2-3 miles almost daily      Posture/Postural Control   Posture Comments  fwd head, rounded shoulder      ROM / Strength   AROM / PROM / Strength  AROM;Strength      AROM   Overall AROM Comments  Lumbar ROM decreased 25% for all motions with extension increasing the buttock and the lateral ankle pain      Strength   Overall Strength Comments  LE strength is 4-/5 with some pain      Flexibility   Soft Tissue Assessment /Muscle Length  yes    Hamstrings  tight and painful at 50 degrees SLR    Piriformis  tight and painful on the right      Palpation   Palpation comment  tight in the lumbar area, she is very tender in the riht buttock, non tender in the ankle                Objective measurements completed on examination: See above findings.      [facility] Adult PT Treatment/Exercise - 05/28/19 0001      Ambulation/Gait   Gait Comments  antalgic on the right, slow, does stairs  one at a time      Modalities   Modalities  Moist Heat;Electrical Stimulation      Moist Heat Therapy   Number Minutes Moist Heat  7 Minutes    Moist Heat Location  Lumbar Spine      Electrical Stimulation   Electrical Stimulation Location  right buttock  did not tolerate this, reported it made her worse, stopped halfway    Electrical Stimulation Action  IFC    Electrical Stimulation Parameters  supine    Electrical Stimulation Goals  Pain               PT Short Term Goals - 05/28/19 1716      PT SHORT TERM GOAL #1   Title  independent with initial HEP    Time  2    Period  Weeks    Status  New        PT Long Term Goals - 05/28/19 1716      PT LONG TERM GOAL #1   Title  return to her walking routine    Time  8    Period  Weeks    Status  New      PT LONG TERM GOAL #2   Title  increase lumbar ROM to WFL's    Time  8    Period  Weeks    Status  New      PT LONG TERM GOAL #3   Title  increase LE strength to 4+/5    Time  8    Period  Weeks    Status  New      PT LONG TERM GOAL #4   Title  report able to sleep at night without issues    Time  8    Period  Weeks    Status  New      PT LONG TERM GOAL #5   Title  stand to cook a meal without pain > 4/10    Time  8    Period  Weeks    Status  New  Plan - 05/28/19 1709    Clinical Impression Statement  Patient reports that she has right buttock pain and pain into the right lateral ankle, MD feels like it is piriformis syndrome.  She has some limitation in lumbar ROM with extension causing immediate pain in to the right buttock.  She is tight and painful in the right piriformis and the HS.  I tried estim but it seemed to aggravate her pain in the ankle, could have been posistional.  Tried some manual sheet traction but this aggravated it as well.  She is having difficulty walking, goes up and down stairs one at a time.    Stability/Clinical Decision Making  Evolving/Moderate complexity     Clinical Decision Making  Low    Rehab Potential  Good    PT Frequency  2x / week    PT Duration  8 weeks    PT Treatment/Interventions  ADLs/Self Care Home Management;Electrical Stimulation;Moist Heat;Traction;Ultrasound;Functional mobility training;Stair training;Gait training;Therapeutic activities;Therapeutic exercise;Balance training;Neuromuscular re-education;Manual techniques    PT Next Visit Plan  start gym and core exercises    Consulted and Agree with Plan of Care  Patient       Patient will benefit from skilled therapeutic intervention in order to improve the following deficits and impairments:  Abnormal gait, Pain, Improper body mechanics, Postural dysfunction, Increased muscle spasms, Decreased range of motion, Decreased strength, Impaired flexibility  Visit Diagnosis: Acute right-sided low back pain with right-sided sciatica - Plan: PT plan of care cert/re-cert  Muscle spasm of back - Plan: PT plan of care cert/re-cert  Difficulty in walking, not elsewhere classified - Plan: PT plan of care cert/re-cert     Problem List Patient Active Problem List   Diagnosis Date Noted  . Sciatica 04/26/2019  . Disturbance of skin sensation 02/04/2019  . Eye twitch 02/04/2019  . Right hip pain 08/01/2018  . Breast pain, left 05/17/2018  . Epidermal cyst 04/17/2018  . Allergic rhinitis 06/23/2015  . Eczema 06/23/2015  . Gastro-esophageal reflux disease without esophagitis 06/23/2015  . Hypercholesterolemia without hypertriglyceridemia 06/23/2015  . Benign essential HTN 06/23/2015  . Hypothyroidism 06/23/2015  . Iron deficiency anemia 06/23/2015  . Other seasonal allergic rhinitis 06/23/2015  . Fracture of fibula, left, closed 11/13/2012  . Nondisplaced fracture of fifth right metatarsal bone 11/13/2012    Sumner Boast., PT 05/28/2019, 5:20 PM  [place] [place], [place], [address] Phone:  [phone]   Fax:  [phone]  Name: Shelly Pearson MRN: [id number] Date of Birth: 1940/08/29

## 2019-05-28 NOTE — Patient Instructions (Signed)
Access Code: [email or web address]  URL: [email or web address]  Date: 05/28/2019  Prepared by: Lum Babe   Exercises Supine Piriformis Stretch Pulling Heel to Hip - 5 reps - 1 sets - 20 hold - 2x daily - 7x weekly Hooklying Single Knee to Chest Stretch - 5 reps - 1 sets - 10 hold - 2x daily - 7x weekly Supine Double Knee to Chest - 5 reps - 1 sets - 10 hold - 2x daily - 7x weekly Supine Lower Trunk Rotation - 5 reps - 1 sets - 10 hold - 2x daily - 7x weekly

## 2019-06-06 ENCOUNTER — Other Ambulatory Visit: Payer: Self-pay

## 2019-06-06 ENCOUNTER — Encounter: Payer: Self-pay | Admitting: Physical Therapy

## 2019-06-06 ENCOUNTER — Ambulatory Visit: Payer: Medicare Other | Attending: Family Medicine | Admitting: Physical Therapy

## 2019-06-06 DIAGNOSIS — M62830 Muscle spasm of back: Secondary | ICD-10-CM | POA: Diagnosis not present

## 2019-06-06 DIAGNOSIS — R262 Difficulty in walking, not elsewhere classified: Secondary | ICD-10-CM

## 2019-06-06 DIAGNOSIS — M6281 Muscle weakness (generalized): Secondary | ICD-10-CM | POA: Diagnosis not present

## 2019-06-06 DIAGNOSIS — M5441 Lumbago with sciatica, right side: Secondary | ICD-10-CM | POA: Diagnosis not present

## 2019-06-06 NOTE — Therapy (Signed)
[address] [place], [place], [address] Phone: [phone]   Fax:  [phone]  Physical Therapy Treatment  [name] MRN: [id number] Date of Birth: Nov 28, 1940 Referring Provider (PT): Gilda Crease   Encounter Date: 06/06/2019  PT End of Session - 06/06/19 1059    Visit Number  2    Date for PT Re-Evaluation  07/28/19    PT Start Time  [id number]    PT Stop Time  1114    PT Time Calculation (min)  59 min    Activity Tolerance  Patient tolerated treatment well    Behavior During Therapy  [facility] for tasks assessed/performed       Past Medical History:  Diagnosis Date  . GERD (gastroesophageal reflux disease)   . Hypertension   . Seasonal allergies   . Thyroid disease   . Urgency incontinence     Past Surgical History:  Procedure Laterality Date  . ABDOMINAL HYSTERECTOMY    . CESAREAN SECTION    . CYSTOCELE REPAIR    . ROTATOR CUFF REPAIR Right    2018    There were no vitals filed for this visit.  Subjective Assessment - 06/06/19 1016    Subjective  pt reports that she did get a lot better but symptoms returned after vacuuming    Currently in Pain?  No/denies                       [facility] Adult PT Treatment/Exercise - 06/06/19 0001      Exercises   Exercises  Lumbar      Lumbar Exercises: Stretches   Passive Hamstring Stretch  Left;Right;4 reps;10 seconds    Single Knee to Chest Stretch  3 reps;Left;Right;10 seconds    Piriformis Stretch  Right;Left;4 reps;20 seconds      Lumbar Exercises: Aerobic   Nustep  L4 x 6 min      Lumbar Exercises: Standing   Row  Strengthening;20 reps;Both    Theraband Level (Row)  Level 2 (Red)    Shoulder Extension  Strengthening;20 reps;Theraband    Theraband Level (Shoulder Extension)  Level 2 (Red)    Other Standing Lumbar Exercises  Hip Ext and abd yellow x 10 each       Lumbar Exercises: Supine   Bridge  5  reps;Compliant;1 second   x2              PT Short Term Goals - 06/06/19 1107      PT SHORT TERM GOAL #1   Title  independent with initial HEP    Status  Achieved        PT Long Term Goals - 05/28/19 1716      PT LONG TERM GOAL #1   Title  return to her walking routine    Time  8    Period  Weeks    Status  New      PT LONG TERM GOAL #2   Title  increase lumbar ROM to WFL's    Time  8    Period  Weeks    Status  New      PT LONG TERM GOAL #3   Title  increase LE strength to 4+/5    Time  8    Period  Weeks    Status  New      PT LONG TERM GOAL #4   Title  report  able to sleep at night without issues    Time  8    Period  Weeks    Status  New      PT LONG TERM GOAL #5   Title  stand to cook a meal without pain > 4/10    Time  8    Period  Weeks    Status  New            Plan - 06/06/19 1101    Clinical Impression Statement  Pt tolerated an initial progression to TE well. She did report a pulling sensation with standing hip ext and with bridges during the first few reps. No reports of increase pain. Cues to for pacing and to focus on muscle contraction with rows. Bilateral HS and piriformis tightness noted with passive stretching.    Stability/Clinical Decision Making  Evolving/Moderate complexity    Rehab Potential  Good    PT Frequency  2x / week    PT Duration  8 weeks    PT Treatment/Interventions  ADLs/Self Care Home Management;Electrical Stimulation;Moist Heat;Traction;Ultrasound;Functional mobility training;Stair training;Gait training;Therapeutic activities;Therapeutic exercise;Balance training;Neuromuscular re-education;Manual techniques    PT Next Visit Plan  gym and core exercises       Patient will benefit from skilled therapeutic intervention in order to improve the following deficits and impairments:  Abnormal gait, Pain, Improper body mechanics, Postural dysfunction, Increased muscle spasms, Decreased range of motion, Decreased  strength, Impaired flexibility  Visit Diagnosis: Muscle spasm of back  Difficulty in walking, not elsewhere classified  Acute right-sided low back pain with right-sided sciatica     Problem List Patient Active Problem List   Diagnosis Date Noted  . Sciatica 04/26/2019  . Disturbance of skin sensation 02/04/2019  . Eye twitch 02/04/2019  . Right hip pain 08/01/2018  . Breast pain, left 05/17/2018  . Epidermal cyst 04/17/2018  . Allergic rhinitis 06/23/2015  . Eczema 06/23/2015  . Gastro-esophageal reflux disease without esophagitis 06/23/2015  . Hypercholesterolemia without hypertriglyceridemia 06/23/2015  . Benign essential HTN 06/23/2015  . Hypothyroidism 06/23/2015  . Iron deficiency anemia 06/23/2015  . Other seasonal allergic rhinitis 06/23/2015  . Fracture of fibula, left, closed 11/13/2012  . Nondisplaced fracture of fifth right metatarsal bone 11/13/2012    Scot Jun 06/06/2019, 11:07 AM  [place], [place], [address] Phone: [phone]   Fax:  [phone]  Name: ALIRA SQUICCIARINI MRN: [id number] Date of Birth: Jul 31, 1940

## 2019-06-10 DIAGNOSIS — Z1231 Encounter for screening mammogram for malignant neoplasm of breast: Secondary | ICD-10-CM | POA: Diagnosis not present

## 2019-06-10 DIAGNOSIS — Z803 Family history of malignant neoplasm of breast: Secondary | ICD-10-CM | POA: Diagnosis not present

## 2019-06-10 LAB — HM MAMMOGRAPHY

## 2019-06-11 ENCOUNTER — Encounter: Payer: Self-pay | Admitting: Physical Therapy

## 2019-06-11 ENCOUNTER — Other Ambulatory Visit: Payer: Self-pay

## 2019-06-11 ENCOUNTER — Ambulatory Visit: Payer: Medicare Other | Admitting: Physical Therapy

## 2019-06-11 DIAGNOSIS — M5441 Lumbago with sciatica, right side: Secondary | ICD-10-CM

## 2019-06-11 DIAGNOSIS — M62830 Muscle spasm of back: Secondary | ICD-10-CM | POA: Diagnosis not present

## 2019-06-11 DIAGNOSIS — R262 Difficulty in walking, not elsewhere classified: Secondary | ICD-10-CM

## 2019-06-11 DIAGNOSIS — M6281 Muscle weakness (generalized): Secondary | ICD-10-CM

## 2019-06-11 NOTE — Therapy (Signed)
[facility] [address] [place], [place], [address] Phone: [phone]   Fax:  [phone]  Physical Therapy Treatment  [name] MRN: [id number] Date of Birth: 05-25-1941 Referring Provider (PT): Gilda Crease   Encounter Date: 06/11/2019  PT End of Session - 06/11/19 1101    Visit Number  3    Date for PT Re-Evaluation  07/28/19    PT Start Time  4360    PT Stop Time  1114    PT Time Calculation (min)  59 min    Activity Tolerance  Patient tolerated treatment well    Behavior During Therapy  [facility] for tasks assessed/performed       Past Medical History:  Diagnosis Date  . GERD (gastroesophageal reflux disease)   . Hypertension   . Seasonal allergies   . Thyroid disease   . Urgency incontinence     Past Surgical History:  Procedure Laterality Date  . ABDOMINAL HYSTERECTOMY    . CESAREAN SECTION    . CYSTOCELE REPAIR    . ROTATOR CUFF REPAIR Right    2018    There were no vitals filed for this visit.  Subjective Assessment - 06/11/19 1015    Subjective  "a little better"  Just a little sore no sharp pains.    Currently in Pain?  Yes    Pain Score  1     Pain Location  Buttocks    Pain Orientation  Right                       [facility] Adult PT Treatment/Exercise - 06/11/19 0001      Lumbar Exercises: Stretches   Passive Hamstring Stretch  Left;Right;4 reps;10 seconds    Single Knee to Chest Stretch  3 reps;Left;Right;10 seconds    Piriformis Stretch  Right;Left;4 reps;20 seconds      Lumbar Exercises: Aerobic   Recumbent Bike  L0 x4 min     Nustep  L4 x 5 min      Lumbar Exercises: Machines for Strengthening   Cybex Knee Extension  5lb 2x10     Cybex Knee Flexion  20lb 2x10      Lumbar Exercises: Standing   Row  Strengthening;20 reps;Both    Theraband Level (Row)  Level 2 (Red)    Shoulder Extension  Strengthening;20 reps;Theraband    Theraband  Level (Shoulder Extension)  Level 2 (Red)    Other Standing Lumbar Exercises  Overheas ext red ball 2x10     Other Standing Lumbar Exercises  Rows and lats 20lb 2x10       Lumbar Exercises: Supine   Bridge  Compliant;2 seconds;20 reps      Modalities   Modalities  Moist Heat;Electrical Stimulation      Moist Heat Therapy   Number Minutes Moist Heat  15 Minutes    Moist Heat Location  Lumbar Spine      Electrical Stimulation   Electrical Stimulation Location  right buttock  did not tolerate this, reported it made her worse, stopped halfway    Electrical Stimulation Action  IFC    Electrical Stimulation Parameters  supine    Electrical Stimulation Goals  Pain               PT Short Term Goals - 06/06/19 1107      PT SHORT TERM GOAL #1   Title  independent with initial HEP  Status  Achieved        PT Long Term Goals - 06/11/19 1101      PT LONG TERM GOAL #1   Title  return to her walking routine    Status  On-going      PT LONG TERM GOAL #2   Title  increase lumbar ROM to WFL's    Status  On-going      PT LONG TERM GOAL #3   Title  increase LE strength to 4+/5    Status  On-going      PT LONG TERM GOAL #4   Title  report able to sleep at night without issues    Status  Partially Met            Plan - 06/11/19 1102    Clinical Impression Statement  Pt reports improvement overall. Some soreness on her R buttocks but no sharp pain like she has had previously. Added some machine level interventions for postural strengthening. Tactile cues needed with seated rows. No pain reported with supine bridges. Some HS and piriformis tightness noted.    Stability/Clinical Decision Making  Evolving/Moderate complexity    Rehab Potential  Good    PT Frequency  2x / week    PT Treatment/Interventions  ADLs/Self Care Home Management;Electrical Stimulation;Moist Heat;Traction;Ultrasound;Functional mobility training;Stair training;Gait training;Therapeutic  activities;Therapeutic exercise;Balance training;Neuromuscular re-education;Manual techniques    PT Next Visit Plan  gym and core exercises       Patient will benefit from skilled therapeutic intervention in order to improve the following deficits and impairments:  Abnormal gait, Pain, Improper body mechanics, Postural dysfunction, Increased muscle spasms, Decreased range of motion, Decreased strength, Impaired flexibility  Visit Diagnosis: Difficulty in walking, not elsewhere classified  Acute right-sided low back pain with right-sided sciatica  Muscle spasm of back  Muscle weakness (generalized)     Problem List Patient Active Problem List   Diagnosis Date Noted  . Sciatica 04/26/2019  . Disturbance of skin sensation 02/04/2019  . Eye twitch 02/04/2019  . Right hip pain 08/01/2018  . Breast pain, left 05/17/2018  . Epidermal cyst 04/17/2018  . Allergic rhinitis 06/23/2015  . Eczema 06/23/2015  . Gastro-esophageal reflux disease without esophagitis 06/23/2015  . Hypercholesterolemia without hypertriglyceridemia 06/23/2015  . Benign essential HTN 06/23/2015  . Hypothyroidism 06/23/2015  . Iron deficiency anemia 06/23/2015  . Other seasonal allergic rhinitis 06/23/2015  . Fracture of fibula, left, closed 11/13/2012  . Nondisplaced fracture of fifth right metatarsal bone 11/13/2012    Scot Jun, PTA 06/11/2019, 11:09 AM  [facility] [place], [place], [address] Phone: [phone]   Fax:  [phone]  Name: Shelly Pearson MRN: [id number] Date of Birth: 1941-03-26

## 2019-06-11 NOTE — Patient Instructions (Signed)
Access Code: [email or web address]  URL: [email or web address]  Date: 06/11/2019  Prepared by: Cheri Fowler   Exercises Standing Row with Resistance - 10 reps - 3 sets - 1x daily - 7x weekly Shoulder extension with resistance - Neutral - 10 reps - 3 sets - 1x daily - 7x weekly Supine Bridge - 10 reps - 3 sets - 1x daily - 7x weekly

## 2019-06-18 ENCOUNTER — Other Ambulatory Visit: Payer: Self-pay

## 2019-06-18 ENCOUNTER — Encounter: Payer: Self-pay | Admitting: Physical Therapy

## 2019-06-18 ENCOUNTER — Ambulatory Visit: Payer: Medicare Other | Admitting: Physical Therapy

## 2019-06-18 DIAGNOSIS — M62830 Muscle spasm of back: Secondary | ICD-10-CM | POA: Diagnosis not present

## 2019-06-18 DIAGNOSIS — R262 Difficulty in walking, not elsewhere classified: Secondary | ICD-10-CM

## 2019-06-18 DIAGNOSIS — M5441 Lumbago with sciatica, right side: Secondary | ICD-10-CM

## 2019-06-18 NOTE — Therapy (Signed)
[facility] [address] [place], [place], [address] Phone: [phone]   Fax:  [phone]  Physical Therapy Treatment  [name] MRN: [id number] Date of Birth: 03/26/1941 Referring Provider (PT): C. Matthews   Encounter Date: 06/18/2019    Past Medical History:  Diagnosis Date  . GERD (gastroesophageal reflux disease)   . Hypertension   . Seasonal allergies   . Thyroid disease   . Urgency incontinence     Past Surgical History:  Procedure Laterality Date  . ABDOMINAL HYSTERECTOMY    . CESAREAN SECTION    . CYSTOCELE REPAIR    . ROTATOR CUFF REPAIR Right    2018    There were no vitals filed for this visit.  Subjective Assessment - 06/18/19 0931    Subjective  Doing really well, not pain since last treatment. Vacuumed the other day with no after effects    Currently in Pain?  No/denies         [facility] PT Assessment - 06/18/19 0001      AROM   Overall AROM   Within functional limits for tasks performed      Strength   Overall Strength  Within functional limits for tasks performed                   [facility] Adult PT Treatment/Exercise - 06/18/19 0001      Lumbar Exercises: Stretches   Passive Hamstring Stretch  Left;Right;4 reps;10 seconds    Single Knee to Chest Stretch  3 reps;Left;Right;10 seconds      Lumbar Exercises: Aerobic   Recumbent Bike  L0 x3 min     Nustep  L4 x 6 min      Lumbar Exercises: Machines for Strengthening   Cybex Lumbar Extension  Black band Ext 2x10     Cybex Knee Extension  5lb 2x10     Cybex Knee Flexion  20lb 2x10      Lumbar Exercises: Standing   Row  Strengthening;20 reps;Both    Theraband Level (Row)  Level 2 (Red)    Shoulder Extension  Strengthening;20 reps;Theraband    Theraband Level (Shoulder Extension)  Level 2 (Red)    Other Standing Lumbar Exercises  Overheas ext red ball 2x10; Hip Ext red band 2x10 each     Other Standing Lumbar Exercises  Rows and lats 20lb 2x10       Lumbar Exercises: Supine   Bridge  Compliant;2 seconds;20 reps               PT Short Term Goals - 06/06/19 1107      PT SHORT TERM GOAL #1   Title  independent with initial HEP    Status  Achieved        PT Long Term Goals - 06/18/19 1009      PT LONG TERM GOAL #1   Title  return to her walking routine    Status  Partially Met      PT LONG TERM GOAL #2   Title  increase lumbar ROM to WFL's    Status  Achieved      PT LONG TERM GOAL #3   Title  increase LE strength to 4+/5    Status  Achieved      PT LONG TERM GOAL #4   Title  report able to sleep at night without issues    Status  Achieved        PT LONG TERM GOAL #5   Title  stand to cook a meal without pain > 4/10    Status  Achieved            Plan - 06/18/19 1009    Clinical Impression Statement  Most goals met. Pt is pleased with her current functional status. She reports no symptoms since last treatment session. Good strength and ROM with all of today exercises. Denied modality post treatment    Stability/Clinical Decision Making  Evolving/Moderate complexity    Rehab Potential  Good    PT Frequency  2x / week    PT Duration  8 weeks    PT Treatment/Interventions  ADL/Self Care Home Management;Electrical Stimulation;Moist Heat;Traction;Ultrasound;Functional mobility training;Stair training;Gait training;Therapeutic activities;Therapeutic exercise;Balance training;Neuromuscular re-education;Manual techniques    PT Next Visit Plan  2 week hold, pt will call and schedule more apts if symptoms return within two weeks. D/C if pt does not call       Patient will benefit from skilled therapeutic intervention in order to improve the following deficits and impairments:  Abnormal gait, Pain, Improper body mechanics, Postural dysfunction, Increased muscle spasms, Decreased range of motion, Decreased strength, Impaired flexibility  Visit  Diagnosis: Acute right-sided low back pain with right-sided sciatica  Difficulty in walking, not elsewhere classified  Muscle spasm of back     Problem List Patient Active Problem List   Diagnosis Date Noted  . Sciatica 04/26/2019  . Disturbance of skin sensation 02/04/2019  . Eye twitch 02/04/2019  . Right hip pain 08/01/2018  . Breast pain, left 05/17/2018  . Epidermal cyst 04/17/2018  . Allergic rhinitis 06/23/2015  . Eczema 06/23/2015  . Gastro-esophageal reflux disease without esophagitis 06/23/2015  . Hypercholesterolemia without hypertriglyceridemia 06/23/2015  . Benign essential HTN 06/23/2015  . Hypothyroidism 06/23/2015  . Iron deficiency anemia 06/23/2015  . Other seasonal allergic rhinitis 06/23/2015  . Fracture of fibula, left, closed 11/13/2012  . Nondisplaced fracture of fifth right metatarsal bone 11/13/2012    Scot Jun, PTA 06/18/2019, 10:12 AM  [facility] [place], [place], [address] Phone: [phone]   Fax:  [phone]  Name: Shelly Pearson MRN: [id number] Date of Birth: 14-Oct-1940

## 2019-06-19 ENCOUNTER — Encounter: Payer: Self-pay | Admitting: Family Medicine

## 2019-06-19 NOTE — Progress Notes (Signed)
[facility]- [facility]. There is no mammographic evidence of malignancy. Normal. BI-RADS:Negative.

## 2019-06-23 ENCOUNTER — Other Ambulatory Visit: Payer: Self-pay | Admitting: Family Medicine

## 2019-07-01 ENCOUNTER — Other Ambulatory Visit: Payer: Self-pay | Admitting: Family Medicine

## 2019-07-01 MED ORDER — LOSARTAN POTASSIUM 25 MG PO TABS: 25.0000 mg | ORAL_TABLET | Freq: Every day | ORAL | 0 refills | Status: DC

## 2019-07-01 MED ORDER — FAMOTIDINE 40 MG PO TABS: 40.0000 mg | ORAL_TABLET | Freq: Every day | ORAL | 0 refills | Status: DC

## 2019-07-01 MED ORDER — TROSPIUM CHLORIDE 20 MG PO TABS: 20.0000 mg | ORAL_TABLET | Freq: Every day | ORAL | 0 refills | Status: DC

## 2019-07-01 NOTE — Telephone Encounter (Signed)
Medication Refill - Medication: losartan (COZAAR) 25 MG tablet trospium (SANCTURA) 20 MG tablet famotidine (PEPCID) 40 MG tablet  Has the patient contacted their pharmacy? Yes - states they haven't heard from PCP and it has been a week since request (Agent: If no, request that the patient contact the pharmacy for the refill.) (Agent: If yes, when and what did the pharmacy advise?)  Preferred Pharmacy (with phone number or street name):  [facility] [place] [facility] [place] [facility] Phone:  [phone]  Fax:  [phone]     Agent: Please be advised that RX refills may take up to 3 business days. We ask that you follow-up with your pharmacy.

## 2019-07-08 ENCOUNTER — Telehealth: Payer: Self-pay

## 2019-07-08 ENCOUNTER — Ambulatory Visit: Payer: Self-pay

## 2019-07-08 NOTE — Telephone Encounter (Signed)
Pt said she cant do cortisone bc the bump is on her eyelid and she tried cool compresses already. Pt scheduled for VV tomorrow.

## 2019-07-08 NOTE — Telephone Encounter (Signed)
Patient called stating that just befor Christmas she treated a few small black head areas with an OTC medication called ZIT FREE.  She states she had a blackhead over her left eye and on her nose and chin  She states that with a small amount applied for 3 days she developed redness and swelling to the areas.  She states she did not call because of the holiday and the redness and swelling has gone but she has itching to  the area around her left eye. She states the itching wakes her at night.  She has tried multiple creams for dryness but nothing has worked.  She tried benadryl cream but that burned when it was applied.  She has not tried other itch medications because it is around her eye. Care advice read to patient. She verbalized understanding. Call x 3 placed to office for scheduling. Call would not go through. Will route note. Patient is aware and will wait for call.  Reason for Disposition . [1] MODERATE-SEVERE local itching (i.e., interferes with work, school, activities) AND [2] not improved after 24 hours of hydrocortisone cream  Answer Assessment - Initial Assessment Questions 1. DESCRIPTION: "Describe the itching you are having." "Where is it located?"     Around eye lid and below left eye 2. SEVERITY: "How bad is it?"    - MILD - doesn't interfere with normal activities   - MODERATE-SEVERE: interferes with work, school, sleep, or other activities      Wakes her up at night 3. SCRATCHING: "Are there any scratch marks? Bleeding?"     none 4. ONSET: "When did the itching begin?"     After using OTC medication to remove black heads 5. CAUSE: "What do you think is causing the itching?"      Zit Free 6. OTHER SYMPTOMS: "Do you have any other symptoms?"     Eye is dry 7. PREGNANCY: "Is there any chance you are pregnant?" "When was your last menstrual period?"   N/A  Protocols used: ITCHING - LOCALIZED-A-AH

## 2019-07-08 NOTE — Telephone Encounter (Signed)
Copied from [place] [id number]. Topic: Appointment Scheduling - Scheduling Inquiry for Clinic >> Jul 08, 2019  1:02 PM Burchel, Abbi R wrote: Reason for CRM: Pt would like to begin seeing Dr Ethelene Hal following Dr Zigmund Daniel departure from the clinic.

## 2019-07-08 NOTE — Telephone Encounter (Signed)
Sure

## 2019-07-08 NOTE — Telephone Encounter (Signed)
VV should be fine.  She can try OTC cortisone, as long as she avoids getting this on the eyelid, and cool compress.

## 2019-07-09 ENCOUNTER — Telehealth (INDEPENDENT_AMBULATORY_CARE_PROVIDER_SITE_OTHER): Payer: Medicare Other | Admitting: Family Medicine

## 2019-07-09 ENCOUNTER — Encounter: Payer: Self-pay | Admitting: Family Medicine

## 2019-07-09 VITALS — Ht 64.0 in | Wt 132.0 lb

## 2019-07-09 DIAGNOSIS — L244 Irritant contact dermatitis due to drugs in contact with skin: Secondary | ICD-10-CM | POA: Diagnosis not present

## 2019-07-09 MED ORDER — PREDNISONE 10 MG (21) PO TBPK: ORAL_TABLET | ORAL | 0 refills | Status: DC

## 2019-07-09 NOTE — Assessment & Plan Note (Signed)
Irritant contact dermatitis likely related to benzoyl peroxide.  Improved some but still with some swelling.  Given location along eyelids will treat with systemic steroid taper.   She will let me know if this continues to not improve.  Discussed she should let me know of any new or worsening symptoms as well.

## 2019-07-09 NOTE — Progress Notes (Signed)
Shelly Pearson - 79 y.o. female MRN [id number]  Date of birth: January 13, 1941   This visit type was conducted due to national recommendations for restrictions regarding the COVID-19 Pandemic (e.g. social distancing).  This format is felt to be most appropriate for this patient at this time.  All issues noted in this document were discussed and addressed.  No physical exam was performed (except for noted visual exam findings with Video Visits).  I discussed the limitations of evaluation and management by telemedicine and the availability of in person appointments. The patient expressed understanding and agreed to proceed.  I connected with@ on 07/09/19 at  2:20 PM EST by a video enabled telemedicine application and verified that I am speaking with the correct person using two identifiers.  Present at visit: Luetta Nutting, DO Carol [facility]   Patient Location: Home [address] [place] [address]   Provider location:   [place]  Chief Complaint  Patient presents with  . Belepharitis    Pt has small itchy bump on eyelid    HPI  Shelly Pearson is a 79 y.o. female who presents via audio/video conferencing for a telehealth visit today.  She has complaint of irritation and itching around the eye.  She reports that she purchased a product called "zit free" (10% benzoyl peroxide active ingredient) to treat a few black heads that she had.  The day after applying her L eye was swollen and red.  This was about 2 weeks ago.  She took some benadryl and tried some OTC creams.  The swelling has improved but eye still feels puffy and is itchy.  She denies problems opening or closing the eye or vision changes.     ROS:  A comprehensive ROS was completed and negative except as noted per HPI  Past Medical History:  Diagnosis Date  . GERD (gastroesophageal reflux disease)   . Hypertension   . Seasonal allergies   . Thyroid disease   . Urgency incontinence     Past  Surgical History:  Procedure Laterality Date  . ABDOMINAL HYSTERECTOMY    . CESAREAN SECTION    . CYSTOCELE REPAIR    . ROTATOR CUFF REPAIR Right    2018    Family History  Problem Relation Age of Onset  . Stroke Mother   . Cancer Father   . Cancer Sister   . Cancer Brother     Social History   Socioeconomic History  . Marital status: Married    Spouse name: Not on file  . Number of children: Not on file  . Years of education: Not on file  . Highest education level: Not on file  Occupational History  . Not on file  Tobacco Use  . Smoking status: Never Smoker  . Smokeless tobacco: Never Used  Substance and Sexual Activity  . Alcohol use: No  . Drug use: No  . Sexual activity: Not on file  Other Topics Concern  . Not on file  Social History Narrative  . Not on file   Social Determinants of Health   Financial Resource Strain:   . Difficulty of Paying Living Expenses: Not on file  Food Insecurity:   . Worried About Charity fundraiser in the Last Year: Not on file  . Ran Out of Food in the Last Year: Not on file  Transportation Needs:   . Lack of Transportation (Medical): Not on file  . Lack of Transportation (Non-Medical): Not on  file  Physical Activity:   . Days of Exercise per Week: Not on file  . Minutes of Exercise per Session: Not on file  Stress:   . Feeling of Stress : Not on file  Social Connections:   . Frequency of Communication with Friends and Family: Not on file  . Frequency of Social Gatherings with Friends and Family: Not on file  . Attends Religious Services: Not on file  . Active Member of Clubs or Organizations: Not on file  . Attends Archivist Meetings: Not on file  . Marital Status: Not on file  Intimate Partner Violence:   . Fear of Current or Ex-Partner: Not on file  . Emotionally Abused: Not on file  . Physically Abused: Not on file  . Sexually Abused: Not on file     Current Outpatient Medications:  .  atorvastatin  (LIPITOR) 20 MG tablet, TAKE 1 TABLET DAILY, Disp: 90 tablet, Rfl: 3 .  BIOTIN PO, 1 capsule, Disp: , Rfl:  .  Cholecalciferol (VITAMIN D3) 2000 UNITS capsule, , Disp: , Rfl:  .  Cyanocobalamin (VITAMIN B12) 1000 MCG TBCR, , Disp: , Rfl:  .  famotidine (PEPCID) 40 MG tablet, Take 1 tablet (40 mg total) by mouth daily., Disp: 90 tablet, Rfl: 0 .  levothyroxine (SYNTHROID) 50 MCG tablet, TAKE 1 TABLET DAILY BEFORE BREAKFAST, Disp: 90 tablet, Rfl: 3 .  losartan (COZAAR) 25 MG tablet, Take 1 tablet (25 mg total) by mouth daily., Disp: 90 tablet, Rfl: 0 .  trospium (SANCTURA) 20 MG tablet, Take 1 tablet (20 mg total) by mouth at bedtime., Disp: 90 tablet, Rfl: 0 .  predniSONE (STERAPRED UNI-PAK 21 TAB) 10 MG (21) TBPK tablet, Taper as directed on packaging., Disp: 21 tablet, Rfl: 0  EXAM:  VITALS per patient if applicable: Ht 5\' 4"  (1.626 m)   Wt 132 lb (59.9 kg) Comment: per pt.  BMI 22.66 kg/m   GENERAL: alert, oriented, appears well and in no acute distress  HEENT: atraumatic, conjunttiva clear, no obvious abnormalities on inspection of external nose and ears  NECK: normal movements of the head and neck  LUNGS: on inspection no signs of respiratory distress, breathing rate appears normal, no obvious gross SOB, gasping or wheezing  CV: no obvious cyanosis  MS: moves all visible extremities without noticeable abnormality  PSYCH/NEURO: pleasant and cooperative, no obvious depression or anxiety, speech and thought processing grossly intact  ASSESSMENT AND PLAN:  Discussed the following assessment and plan:  Irritant contact dermatitis due to drug in contact with skin Irritant contact dermatitis likely related to benzoyl peroxide.  Improved some but still with some swelling.  Given location along eyelids will treat with systemic steroid taper.   She will let me know if this continues to not improve.  Discussed she should let me know of any new or worsening symptoms as well.      I  discussed the assessment and treatment plan with the patient. The patient was provided an opportunity to ask questions and all were answered. The patient agreed with the plan and demonstrated an understanding of the instructions.   The patient was advised to call back or seek an in-person evaluation if the symptoms worsen or if the condition fails to improve as anticipated.    Luetta Nutting, DO

## 2019-07-12 ENCOUNTER — Encounter: Payer: Self-pay | Admitting: Family Medicine

## 2019-07-23 IMAGING — RF DG ESOPHAGUS
8 of 9 series · 14 of 24 positions shown · non-contrast
Comparison: None.

CLINICAL DATA: Chronic dysphagia.

EXAM:
ESOPHOGRAM / BARIUM SWALLOW / BARIUM TABLET STUDY
TECHNIQUE: Combined double contrast and single contrast examination performed
using effervescent crystals, thick barium liquid, and thin barium
liquid. The patient was observed with fluoroscopy swallowing a 13 mm
barium sulphate tablet.
FLUOROSCOPY TIME:  Fluoroscopy Time: 1 minutes and 42 seconds of
low-dose pulsed fluoro
Radiation Exposure Index (if provided by the fluoroscopic device):
24 mGy
Number of Acquired Spot Images: 0

[Series 1: sequence · 2 of 13 frames shown (1 of 8)]
[frame 2/13]
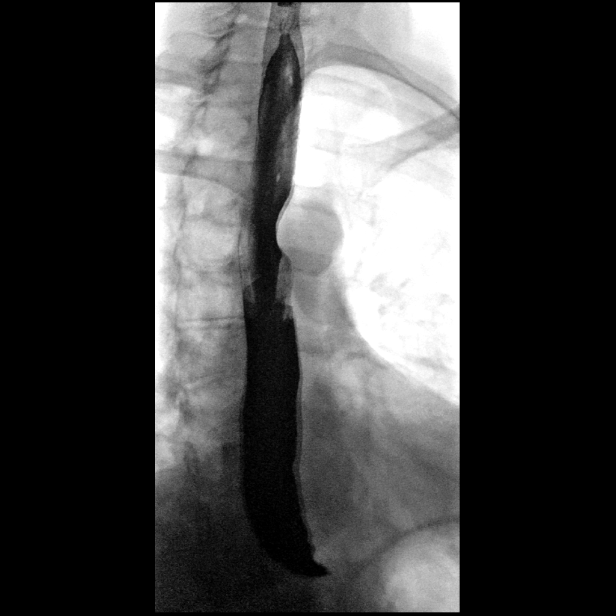
[frame 9/13]
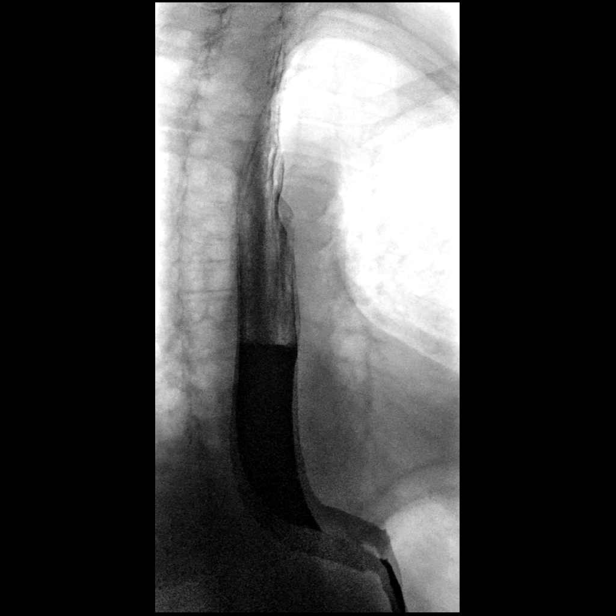

[Series 2: sequence · 2 of 18 frames shown (2 of 8)]
[frame 7/18]
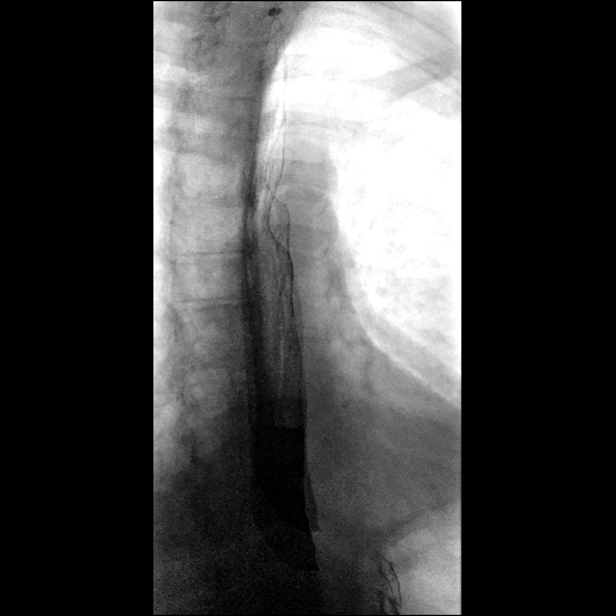
[frame 16/18]
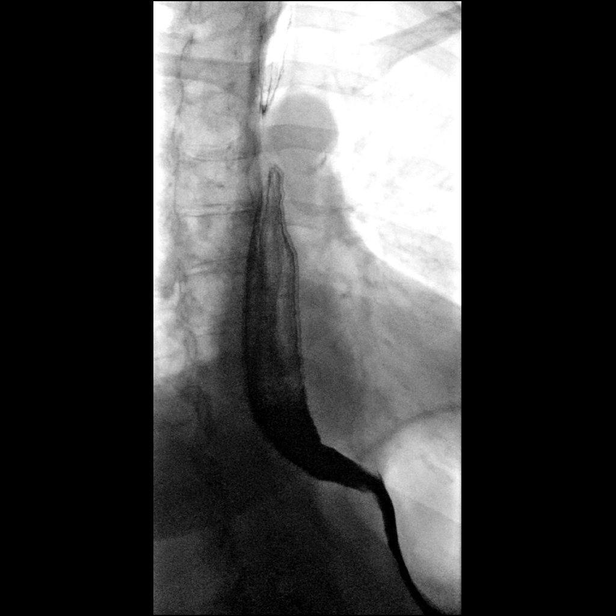

[Series 3: sequence · 2 of 10 frames shown (3 of 8)]
[frame 4/10]
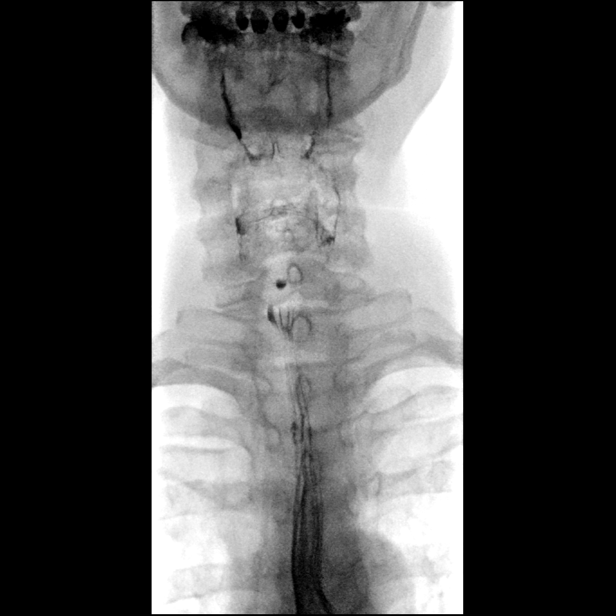
[frame 9/10]
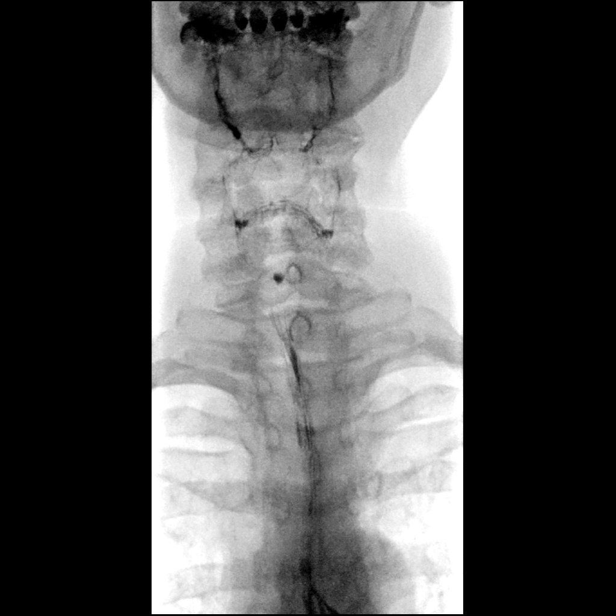

[Series 4: sequence · 2 of 11 frames shown (4 of 8)]
[frame 6/11]
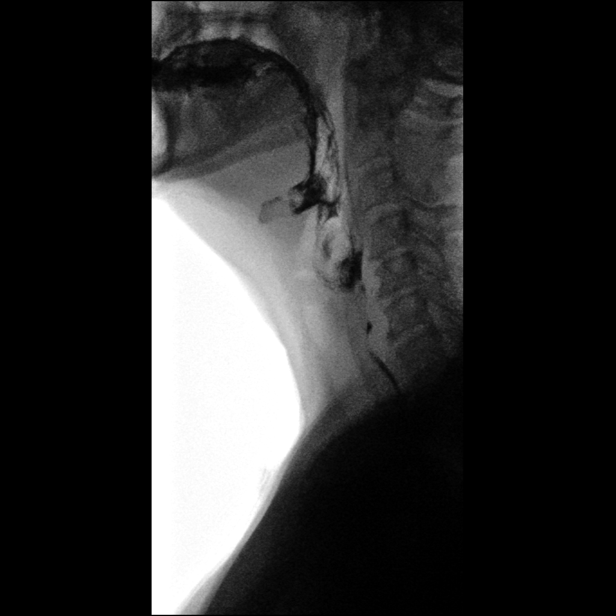
[frame 11/11]
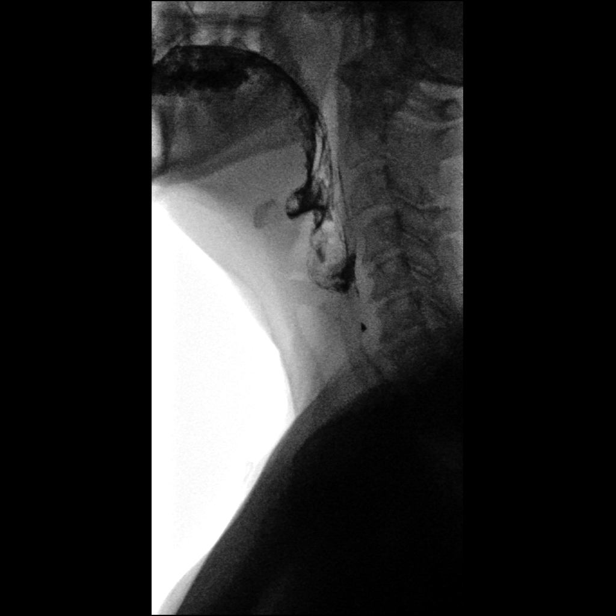

[Series 5: sequence · 2 of 8 frames shown (5 of 8)]
[frame 2/8]
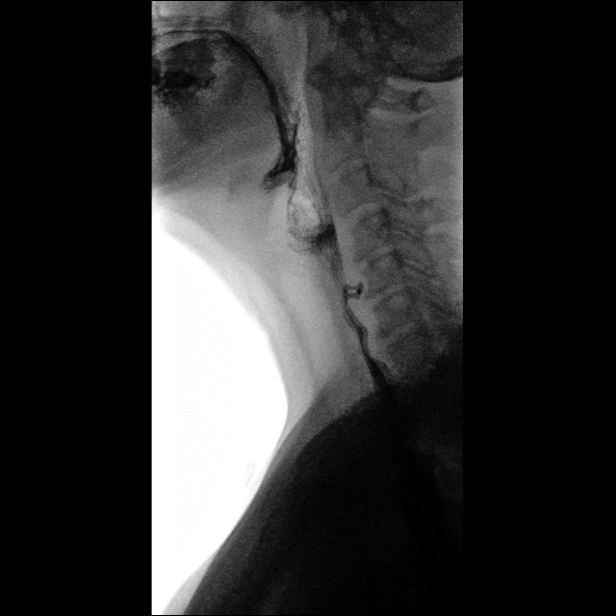
[frame 7/8]
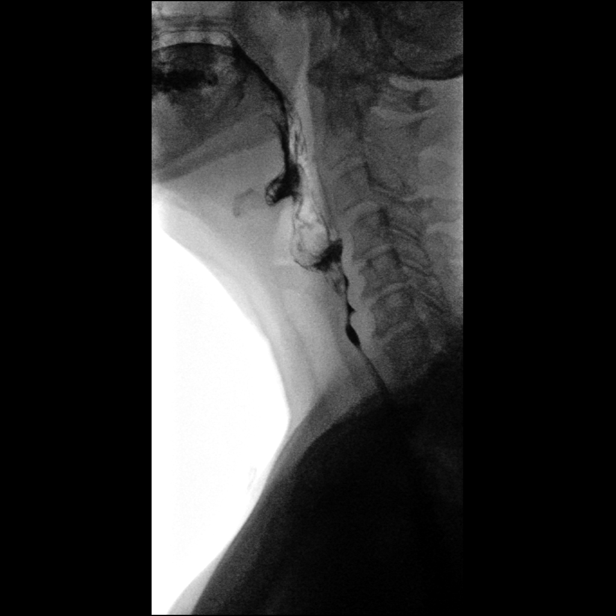

[Series 6: sequence · 2 of 24 frames shown (6 of 8)]
[frame 21/24]
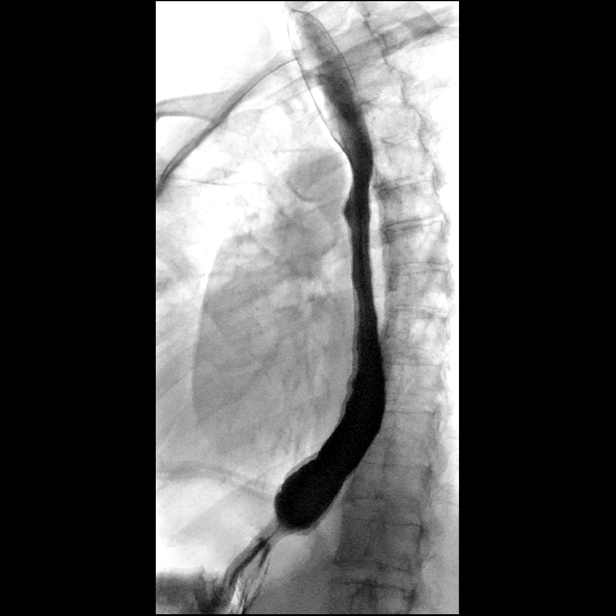
[frame 24/24]
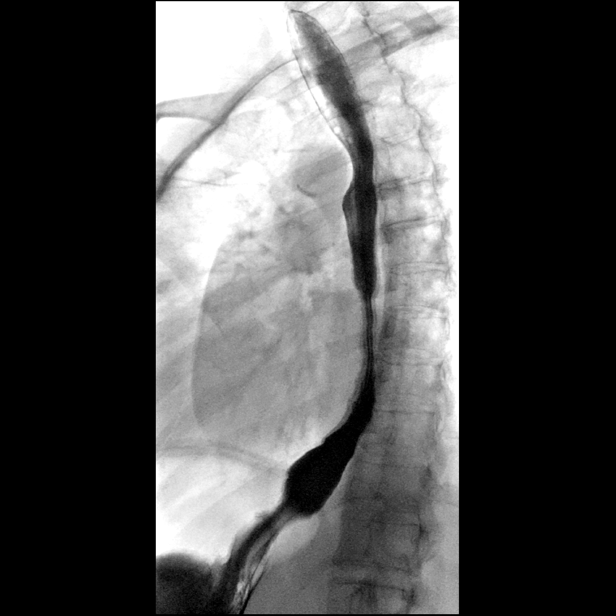

[Series 8: sequence · 1 of 5 frames shown (7 of 8)]
[frame 3/5]
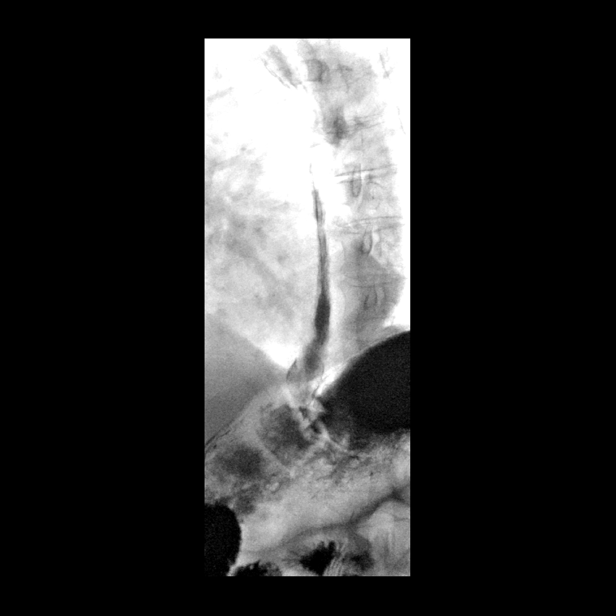

[Series 9: sequence · 1 of 1 slices shown (8 of 8)]
[im 1/1]
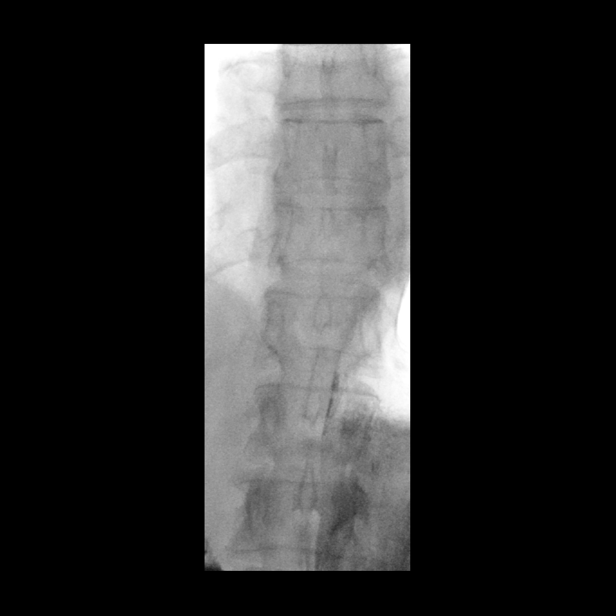

[14 of 24 positions shown; findings below may reference images not displayed]

FINDINGS: The esophageal motility is within normal limits. There is no
evidence of stricture, mass or ulceration. Rapid sequence imaging of
the pharynx in the AP and lateral projections demonstrates no
mucosal abnormalities.

Mild gastroesophageal reflux was elicited with the water siphon
test. At the conclusion of the study, a 13 mm barium tablet was
administered. This passed without delay into the stomach.
IMPRESSION: Mild gastroesophageal reflux. No evidence of esophagitis or
stricture.

## 2019-09-18 ENCOUNTER — Other Ambulatory Visit: Payer: Self-pay

## 2019-09-19 ENCOUNTER — Ambulatory Visit (INDEPENDENT_AMBULATORY_CARE_PROVIDER_SITE_OTHER): Payer: Medicare Other | Admitting: Family Medicine

## 2019-09-19 ENCOUNTER — Encounter: Payer: Self-pay | Admitting: Family Medicine

## 2019-09-19 VITALS — BP 118/72 | HR 76 | Temp 97.8°F | Ht 64.0 in | Wt 139.0 lb

## 2019-09-19 DIAGNOSIS — N3281 Overactive bladder: Secondary | ICD-10-CM | POA: Diagnosis not present

## 2019-09-19 DIAGNOSIS — M180 Bilateral primary osteoarthritis of first carpometacarpal joints: Secondary | ICD-10-CM | POA: Insufficient documentation

## 2019-09-19 DIAGNOSIS — G629 Polyneuropathy, unspecified: Secondary | ICD-10-CM

## 2019-09-19 DIAGNOSIS — K219 Gastro-esophageal reflux disease without esophagitis: Secondary | ICD-10-CM

## 2019-09-19 DIAGNOSIS — R202 Paresthesia of skin: Secondary | ICD-10-CM | POA: Insufficient documentation

## 2019-09-19 DIAGNOSIS — I10 Essential (primary) hypertension: Secondary | ICD-10-CM | POA: Diagnosis not present

## 2019-09-19 MED ORDER — TROSPIUM CHLORIDE 20 MG PO TABS: 20.0000 mg | ORAL_TABLET | Freq: Every day | ORAL | 0 refills | Status: DC

## 2019-09-19 MED ORDER — LOSARTAN POTASSIUM 25 MG PO TABS: 25.0000 mg | ORAL_TABLET | Freq: Every day | ORAL | 1 refills | Status: DC

## 2019-09-19 MED ORDER — FAMOTIDINE 40 MG PO TABS: 40.0000 mg | ORAL_TABLET | Freq: Every day | ORAL | 4 refills | Status: DC

## 2019-09-19 NOTE — Patient Instructions (Signed)

## 2019-09-19 NOTE — Progress Notes (Signed)
Established Patient Office Visit  Subjective:  Patient ID: Shelly Pearson, female    DOB: 1940-08-04  Age: 79 y.o. MRN: [id number]  CC:  Chief Complaint  Patient presents with  . Transitions Of Care    TOC, refill on medications, patient would like knot checked on right pointer finger, c/o legs swelling, not able to grasp things    HPI Shelly Pearson presents for follow-up of her hypertension, GERD, overactive bladder and paresthesias in her feet.  Blood pressure has been well controlled with low-dose losartan.  She has been on [place] for overactive bladder for years without issue.  She does report constipation on occasion that is managed with daily flaxseed.  She has been on this medication for years and denies dry eyes or mouth.  She has had a recent fall that involved tripping.  Ongoing issues with tingling in her feet and toes.  Recent B12 and TSH levels have been normal.  She is concerned about her circulation.  She exercises regularly by walking with her husband.  History of osteopenia documented by recent DEXA scan.  She is treating with calcium and vitamin D.  Past Medical History:  Diagnosis Date  . GERD (gastroesophageal reflux disease)   . Hypertension   . Seasonal allergies   . Thyroid disease   . Urgency incontinence     Past Surgical History:  Procedure Laterality Date  . ABDOMINAL HYSTERECTOMY    . CESAREAN SECTION    . CYSTOCELE REPAIR    . ROTATOR CUFF REPAIR Right    2018    Family History  Problem Relation Age of Onset  . Stroke Mother   . Cancer Father   . Cancer Sister   . Cancer Brother     Social History   Socioeconomic History  . Marital status: Married    Spouse name: Not on file  . Number of children: Not on file  . Years of education: Not on file  . Highest education level: Not on file  Occupational History  . Not on file  Tobacco Use  . Smoking status: Never Smoker  . Smokeless tobacco: Never Used  Substance and Sexual  Activity  . Alcohol use: No  . Drug use: No  . Sexual activity: Not on file  Other Topics Concern  . Not on file  Social History Narrative  . Not on file   Social Determinants of Health   Financial Resource Strain:   . Difficulty of Paying Living Expenses:   Food Insecurity:   . Worried About Charity fundraiser in the Last Year:   . Arboriculturist in the Last Year:   Transportation Needs:   . Film/video editor (Medical):   Marland Kitchen Lack of Transportation (Non-Medical):   Physical Activity:   . Days of Exercise per Week:   . Minutes of Exercise per Session:   Stress:   . Feeling of Stress :   Social Connections:   . Frequency of Communication with Friends and Family:   . Frequency of Social Gatherings with Friends and Family:   . Attends Religious Services:   . Active Member of Clubs or Organizations:   . Attends Archivist Meetings:   Marland Kitchen Marital Status:   Intimate Partner Violence:   . Fear of Current or Ex-Partner:   . Emotionally Abused:   Marland Kitchen Physically Abused:   . Sexually Abused:     Outpatient Medications Prior to Visit  Medication Sig Dispense Refill  .  atorvastatin (LIPITOR) 20 MG tablet TAKE 1 TABLET DAILY 90 tablet 3  . BIOTIN PO 1 capsule    . Cholecalciferol (VITAMIN D3) 2000 UNITS capsule     . Cyanocobalamin (VITAMIN B12) 1000 MCG TBCR     . levothyroxine (SYNTHROID) 50 MCG tablet TAKE 1 TABLET DAILY BEFORE BREAKFAST 90 tablet 3  . famotidine (PEPCID) 40 MG tablet Take 1 tablet (40 mg total) by mouth daily. 90 tablet 0  . losartan (COZAAR) 25 MG tablet Take 1 tablet (25 mg total) by mouth daily. 90 tablet 0  . trospium (SANCTURA) 20 MG tablet Take 1 tablet (20 mg total) by mouth at bedtime. 90 tablet 0  . predniSONE (STERAPRED UNI-PAK 21 TAB) 10 MG (21) TBPK tablet Taper as directed on packaging. 21 tablet 0   No facility-administered medications prior to visit.    Allergies  Allergen Reactions  . Adhesive  [Tape] Other (See Comments)     ROS Review of Systems  Constitutional: Negative.   HENT: Negative.   Respiratory: Negative.   Cardiovascular: Negative.   Gastrointestinal: Negative.   Endocrine: Negative for polyphagia and polyuria.  Genitourinary: Negative.  Negative for frequency and urgency.  Musculoskeletal: Negative for joint swelling.  Skin: Negative for pallor and rash.  Allergic/Immunologic: Negative for immunocompromised state.  Neurological: Positive for numbness. Negative for weakness.  Hematological: Does not bruise/bleed easily.  Psychiatric/Behavioral: Negative.       Objective:    Physical Exam  Constitutional: She is oriented to person, place, and time. She appears well-developed and well-nourished. No distress.  HENT:  Head: Normocephalic and atraumatic.  Right Ear: External ear normal.  Left Ear: External ear normal.  Eyes: Conjunctivae are normal. Right eye exhibits no discharge. Left eye exhibits no discharge. No scleral icterus.  Neck: No JVD present. No tracheal deviation present.  Cardiovascular: Normal rate, regular rhythm and normal heart sounds.  Pulses:      Dorsalis pedis pulses are 1+ on the right side and 1+ on the left side.       Posterior tibial pulses are 1+ on the right side and 1+ on the left side.  Pulmonary/Chest: Effort normal and breath sounds normal. No stridor.  Neurological: She is alert and oriented to person, place, and time.  Skin: Skin is warm and dry. She is not diaphoretic.  Psychiatric: She has a normal mood and affect. Her behavior is normal.    BP 118/72   Pulse 76   Temp 97.8 F (36.6 C) (Tympanic)   Ht 5\' 4"  (1.626 m)   Wt 139 lb (63 kg)   SpO2 97%   BMI 23.86 kg/m  Wt Readings from Last 3 Encounters:  09/19/19 139 lb (63 kg)  07/09/19 132 lb (59.9 kg)  05/17/19 134 lb 12.8 oz (61.1 kg)     There are no preventive care reminders to display for this patient.  There are no preventive care reminders to display for this patient.  Lab  Results  Component Value Date   TSH 0.64 01/30/2019   Lab Results  Component Value Date   WBC 3.7 (L) 01/30/2019   HGB 14.0 01/30/2019   HCT 41.9 01/30/2019   MCV 92.8 01/30/2019   PLT 184.0 01/30/2019   Lab Results  Component Value Date   NA 142 01/30/2019   K 4.5 01/30/2019   CO2 29 01/30/2019   GLUCOSE 105 (H) 01/30/2019   BUN 16 01/30/2019   CREATININE 0.71 01/30/2019   BILITOT 0.6 01/30/2019  ALKPHOS 62 01/30/2019   AST 17 01/30/2019   ALT 19 01/30/2019   PROT 6.6 01/30/2019   ALBUMIN 4.2 01/30/2019   CALCIUM 9.4 01/30/2019   GFR 80.10 01/30/2019   Lab Results  Component Value Date   CHOL 160 01/30/2019   Lab Results  Component Value Date   HDL 63.80 01/30/2019   Lab Results  Component Value Date   LDLCALC 79 01/30/2019   Lab Results  Component Value Date   TRIG 88.0 01/30/2019   Lab Results  Component Value Date   CHOLHDL 3 01/30/2019   No results found for: HGBA1C    Assessment & Plan:   Problem List Items Addressed This Visit      Cardiovascular and Mediastinum   Essential hypertension - Primary   Relevant Medications   losartan (COZAAR) 25 MG tablet   Other Relevant Orders   CBC   Basic metabolic panel     Digestive   Gastro-esophageal reflux disease without esophagitis   Relevant Medications   famotidine (PEPCID) 40 MG tablet     Nervous and Auditory   Neuropathy     Genitourinary   Overactive bladder   Relevant Medications   trospium (SANCTURA) 20 MG tablet      Meds ordered this encounter  Medications  . losartan (COZAAR) 25 MG tablet    Sig: Take 1 tablet (25 mg total) by mouth daily.    Dispense:  90 tablet    Refill:  1  . famotidine (PEPCID) 40 MG tablet    Sig: Take 1 tablet (40 mg total) by mouth daily.    Dispense:  90 tablet    Refill:  4  . trospium (SANCTURA) 20 MG tablet    Sig: Take 1 tablet (20 mg total) by mouth at bedtime.    Dispense:  90 tablet    Refill:  0    Follow-up: Return in about 6  months (around 03/21/2020), or if symptoms worsen or fail to improve.    Libby Maw, MD

## 2019-09-20 LAB — CBC
HCT: 41.6 % (ref 36.0–46.0)
Hemoglobin: 13.8 g/dL (ref 12.0–15.0)
MCHC: 33.2 g/dL (ref 30.0–36.0)
MCV: 92.3 fl (ref 78.0–100.0)
Platelets: 203 10*3/uL (ref 150.0–400.0)
RBC: 4.51 Mil/uL (ref 3.87–5.11)
RDW: 12.7 % (ref 11.5–15.5)
WBC: 5.6 10*3/uL (ref 4.0–10.5)

## 2019-09-20 LAB — BASIC METABOLIC PANEL
BUN: 14 mg/dL (ref 6–23)
CO2: 30 mEq/L (ref 19–32)
Calcium: 9.5 mg/dL (ref 8.4–10.5)
Chloride: 105 mEq/L (ref 96–112)
Creatinine, Ser: 0.71 mg/dL (ref 0.40–1.20)
GFR: 79.96 mL/min (ref >60.00)
Glucose, Bld: 93 mg/dL (ref 70–99)
Potassium: 4 mEq/L (ref 3.5–5.1)
Sodium: 140 mEq/L (ref 135–145)

## 2019-12-06 ENCOUNTER — Other Ambulatory Visit: Payer: Self-pay | Admitting: Family Medicine

## 2019-12-06 DIAGNOSIS — N3281 Overactive bladder: Secondary | ICD-10-CM

## 2020-01-23 ENCOUNTER — Telehealth: Payer: Self-pay | Admitting: Family Medicine

## 2020-01-23 NOTE — Telephone Encounter (Signed)
Spoke with patient she stated she will call back to schedule AWV  

## 2020-02-22 ENCOUNTER — Other Ambulatory Visit: Payer: Self-pay | Admitting: Family Medicine

## 2020-02-27 ENCOUNTER — Telehealth: Payer: Self-pay

## 2020-02-27 ENCOUNTER — Ambulatory Visit (INDEPENDENT_AMBULATORY_CARE_PROVIDER_SITE_OTHER): Payer: Medicare Other

## 2020-02-27 ENCOUNTER — Other Ambulatory Visit: Payer: Self-pay

## 2020-02-27 VITALS — Ht 64.0 in | Wt 130.0 lb

## 2020-02-27 DIAGNOSIS — I10 Essential (primary) hypertension: Secondary | ICD-10-CM

## 2020-02-27 DIAGNOSIS — E7800 Pure hypercholesterolemia, unspecified: Secondary | ICD-10-CM

## 2020-02-27 DIAGNOSIS — Z0000 Encounter for general adult medical examination without abnormal findings: Secondary | ICD-10-CM

## 2020-02-27 MED ORDER — LOSARTAN POTASSIUM 25 MG PO TABS: 25.0000 mg | ORAL_TABLET | Freq: Every day | ORAL | 1 refills | Status: DC

## 2020-02-27 MED ORDER — ATORVASTATIN CALCIUM 20 MG PO TABS: 20.0000 mg | ORAL_TABLET | Freq: Every day | ORAL | 3 refills | Status: DC

## 2020-02-27 NOTE — Patient Instructions (Signed)
Shelly Pearson , Thank you for taking time to complete your Medicare Wellness Visit. I appreciate your ongoing commitment to your health goals. Please review the following plan we discussed and let me know if I can assist you in the future.   Screening recommendations/referrals: Colonoscopy: No longer indicated Mammogram: Completed 06/10/2019-Due 06/09/2020 Bone Density: Completed 06/07/2018-Due 06/07/2020 Recommended yearly ophthalmology/optometry visit for glaucoma screening and checkup Recommended yearly dental visit for hygiene and checkup  Vaccinations: Influenza vaccine: Due 03/2020 Pneumococcal vaccine: Completed vaccines Tdap vaccine: Up to Date- Due 11/09/2023 Shingles vaccine: Discuss with pharmacy   Covid-19:Completed vaccines  Advanced directives: Please bring a copy to your next office visit  Conditions/risks identified: See problem list  Next appointment: Follow up in one year for your annual wellness visit  03/04/2021 @ 10:30am   Preventive Care 77 Years and Older, Female Preventive care refers to lifestyle choices and visits with your health care provider that can promote health and wellness. What does preventive care include?  A yearly physical exam. This is also called an annual well check.  Dental exams once or twice a year.  Routine eye exams. Ask your health care provider how often you should have your eyes checked.  Personal lifestyle choices, including:  Daily care of your teeth and gums.  Regular physical activity.  Eating a healthy diet.  Avoiding tobacco and drug use.  Limiting alcohol use.  Practicing safe sex.  Taking low-dose aspirin every day.  Taking vitamin and mineral supplements as recommended by your health care provider. What happens during an annual well check? The services and screenings done by your health care provider during your annual well check will depend on your age, overall health, lifestyle risk factors, and family history of  disease. Counseling  Your health care provider may ask you questions about your:  Alcohol use.  Tobacco use.  Drug use.  Emotional well-being.  Home and relationship well-being.  Sexual activity.  Eating habits.  History of falls.  Memory and ability to understand (cognition).  Work and work Statistician.  Reproductive health. Screening  You may have the following tests or measurements:  Height, weight, and BMI.  Blood pressure.  Lipid and cholesterol levels. These may be checked every 5 years, or more frequently if you are over 54 years old.  Skin check.  Lung cancer screening. You may have this screening every year starting at age 77 if you have a 30-pack-year history of smoking and currently smoke or have quit within the past 15 years.  Fecal occult blood test (FOBT) of the stool. You may have this test every year starting at age 77.  Flexible sigmoidoscopy or colonoscopy. You may have a sigmoidoscopy every 5 years or a colonoscopy every 10 years starting at age 77.  Hepatitis C blood test.  Hepatitis B blood test.  Sexually transmitted disease (STD) testing.  Diabetes screening. This is done by checking your blood sugar (glucose) after you have not eaten for a while (fasting). You may have this done every 1-3 years.  Bone density scan. This is done to screen for osteoporosis. You may have this done starting at age 77.  Mammogram. This may be done every 1-2 years. Talk to your health care provider about how often you should have regular mammograms. Talk with your health care provider about your test results, treatment options, and if necessary, the need for more tests. Vaccines  Your health care provider may recommend certain vaccines, such as:  Influenza vaccine. This is  recommended every year.  Tetanus, diphtheria, and acellular pertussis (Tdap, Td) vaccine. You may need a Td booster every 10 years.  Zoster vaccine. You may need this after age  77.  Pneumococcal 13-valent conjugate (PCV13) vaccine. One dose is recommended after age 2.  Pneumococcal polysaccharide (PPSV23) vaccine. One dose is recommended after age 77. Talk to your health care provider about which screenings and vaccines you need and how often you need them. This information is not intended to replace advice given to you by your health care provider. Make sure you discuss any questions you have with your health care provider. Document Released: 07/17/2015 Document Revised: 03/09/2016 Document Reviewed: 04/21/2015 Elsevier Interactive Patient Education  2017 [place] Prevention in the Home Falls can cause injuries. They can happen to people of all ages. There are many things you can do to make your home safe and to help prevent falls. What can I do on the outside of my home?  Regularly fix the edges of walkways and driveways and fix any cracks.  Remove anything that might make you trip as you walk through a door, such as a raised step or threshold.  Trim any bushes or trees on the path to your home.  Use bright outdoor lighting.  Clear any walking paths of anything that might make someone trip, such as rocks or tools.  Regularly check to see if handrails are loose or broken. Make sure that both sides of any steps have handrails.  Any raised decks and porches should have guardrails on the edges.  Have any leaves, snow, or ice cleared regularly.  Use sand or salt on walking paths during winter.  Clean up any spills in your garage right away. This includes oil or grease spills. What can I do in the bathroom?  Use night lights.  Install grab bars by the toilet and in the tub and shower. Do not use towel bars as grab bars.  Use non-skid mats or decals in the tub or shower.  If you need to sit down in the shower, use a plastic, non-slip stool.  Keep the floor dry. Clean up any water that spills on the floor as soon as it happens.  Remove  soap buildup in the tub or shower regularly.  Attach bath mats securely with double-sided non-slip rug tape.  Do not have throw rugs and other things on the floor that can make you trip. What can I do in the bedroom?  Use night lights.  Make sure that you have a light by your bed that is easy to reach.  Do not use any sheets or blankets that are too big for your bed. They should not hang down onto the floor.  Have a firm chair that has side arms. You can use this for support while you get dressed.  Do not have throw rugs and other things on the floor that can make you trip. What can I do in the kitchen?  Clean up any spills right away.  Avoid walking on wet floors.  Keep items that you use a lot in easy-to-reach places.  If you need to reach something above you, use a strong step stool that has a grab bar.  Keep electrical cords out of the way.  Do not use floor polish or wax that makes floors slippery. If you must use wax, use non-skid floor wax.  Do not have throw rugs and other things on the floor that can make you trip.  What can I do with my stairs?  Do not leave any items on the stairs.  Make sure that there are handrails on both sides of the stairs and use them. Fix handrails that are broken or loose. Make sure that handrails are as long as the stairways.  Check any carpeting to make sure that it is firmly attached to the stairs. Fix any carpet that is loose or worn.  Avoid having throw rugs at the top or bottom of the stairs. If you do have throw rugs, attach them to the floor with carpet tape.  Make sure that you have a light switch at the top of the stairs and the bottom of the stairs. If you do not have them, ask someone to add them for you. What else can I do to help prevent falls?  Wear shoes that:  Do not have high heels.  Have rubber bottoms.  Are comfortable and fit you well.  Are closed at the toe. Do not wear sandals.  If you use a  stepladder:  Make sure that it is fully opened. Do not climb a closed stepladder.  Make sure that both sides of the stepladder are locked into place.  Ask someone to hold it for you, if possible.  Clearly mark and make sure that you can see:  Any grab bars or handrails.  First and last steps.  Where the edge of each step is.  Use tools that help you move around (mobility aids) if they are needed. These include:  Canes.  Walkers.  Scooters.  Crutches.  Turn on the lights when you go into a dark area. Replace any light bulbs as soon as they burn out.  Set up your furniture so you have a clear path. Avoid moving your furniture around.  If any of your floors are uneven, fix them.  If there are any pets around you, be aware of where they are.  Review your medicines with your doctor. Some medicines can make you feel dizzy. This can increase your chance of falling. Ask your doctor what other things that you can do to help prevent falls. This information is not intended to replace advice given to you by your health care provider. Make sure you discuss any questions you have with your health care provider. Document Released: 04/16/2009 Document Revised: 11/26/2015 Document Reviewed: 07/25/2014 Elsevier Interactive Patient Education  2017 [employer].

## 2020-02-27 NOTE — Progress Notes (Signed)
Subjective:   Shelly Pearson is a 79 y.o. female who presents for Medicare Annual (Subsequent) preventive examination.  I connected with Felecia today by telephone and verified that I am speaking with the correct person using two identifiers. Location patient: home Location provider: work Persons participating in the virtual visit: patient, Marine scientist.    I discussed the limitations, risks, security and privacy concerns of performing an evaluation and management service by telephone and the availability of in person appointments. I also discussed with the patient that there may be a patient responsible charge related to this service. The patient expressed understanding and verbally consented to this telephonic visit.    Interactive audio and video telecommunications were attempted between this provider and patient, however failed, due to patient having technical difficulties OR patient did not have access to video capability.  We continued and completed visit with audio only.  Some vital signs may be absent or patient reported.   Time Spent with patient on telephone encounter: 25 minutes  Review of Systems     Cardiac Risk Factors include: advanced age (>37men, >23 women);dyslipidemia;hypertension     Objective:    Today's Vitals   02/27/20 1028 02/27/20 1029  Weight: 130 lb (59 kg)   Height: 5\' 4"  (1.626 m)   PainSc:  4    Body mass index is 22.31 kg/m.  Advanced Directives 02/27/2020 05/28/2019 11/02/2017  Does Patient Have a Medical Advance Directive? Yes No Yes  Type of Paramedic of [place];Living will - [place];Living will  Does patient want to make changes to medical advance directive? - - No - Patient declined  Copy of [place] in Chart? No - copy requested - No - copy requested  Would patient like information on creating a medical advance directive? - No - Patient declined -    Current Medications  (verified) Outpatient Encounter Medications as of 02/27/2020  Medication Sig  . atorvastatin (LIPITOR) 20 MG tablet TAKE 1 TABLET DAILY  . BIOTIN PO 1 capsule  . Cholecalciferol (VITAMIN D3) 2000 UNITS capsule   . Cyanocobalamin (VITAMIN B12) 1000 MCG TBCR   . famotidine (PEPCID) 40 MG tablet Take 1 tablet (40 mg total) by mouth daily.  Marland Kitchen levothyroxine (SYNTHROID) 50 MCG tablet TAKE 1 TABLET DAILY BEFORE BREAKFAST  . losartan (COZAAR) 25 MG tablet Take 1 tablet (25 mg total) by mouth daily.  . trospium (SANCTURA) 20 MG tablet TAKE 1 TABLET AT BEDTIME   No facility-administered encounter medications on file as of 02/27/2020.    Allergies (verified) Adhesive  [tape]   History: Past Medical History:  Diagnosis Date  . GERD (gastroesophageal reflux disease)   . Hypertension   . Seasonal allergies   . Thyroid disease   . Urgency incontinence    Past Surgical History:  Procedure Laterality Date  . ABDOMINAL HYSTERECTOMY    . CESAREAN SECTION    . CYSTOCELE REPAIR    . ROTATOR CUFF REPAIR Right    2018   Family History  Problem Relation Age of Onset  . Stroke Mother   . Cancer Father   . Cancer Sister   . Cancer Brother    Social History   Socioeconomic History  . Marital status: Married    Spouse name: Not on file  . Number of children: Not on file  . Years of education: Not on file  . Highest education level: Not on file  Occupational History  . Occupation: Retired  Tobacco Use  . Smoking status: Never Smoker  . Smokeless tobacco: Never Used  Vaping Use  . Vaping Use: Never used  Substance and Sexual Activity  . Alcohol use: No  . Drug use: No  . Sexual activity: Not on file  Other Topics Concern  . Not on file  Social History Narrative  . Not on file   Social Determinants of Health   Financial Resource Strain: Low Risk   . Difficulty of Paying Living Expenses: Not hard at all  Food Insecurity: No Food Insecurity  . Worried About Charity fundraiser in  the Last Year: Never true  . Ran Out of Food in the Last Year: Never true  Transportation Needs: No Transportation Needs  . Lack of Transportation (Medical): No  . Lack of Transportation (Non-Medical): No  Physical Activity: Sufficiently Active  . Days of Exercise per Week: 5 days  . Minutes of Exercise per Session: 40 min  Stress: No Stress Concern Present  . Feeling of Stress : Only a little  Social Connections: Socially Integrated  . Frequency of Communication with Friends and Family: More than three times a week  . Frequency of Social Gatherings with Friends and Family: Once a week  . Attends Religious Services: More than 4 times per year  . Active Member of Clubs or Organizations: Yes  . Attends Archivist Meetings: More than 4 times per year  . Marital Status: Married    Tobacco Counseling Counseling given: Not Answered   Clinical Intake:  Pre-visit preparation completed: Yes  Pain : 0-10 Pain Score: 4  Pain Type: Chronic pain Pain Location: Foot Pain Orientation: Right, Left Pain Descriptors / Indicators: Numbness Pain Onset: More than a month ago Pain Frequency: Constant     Nutritional Status: BMI of 19-24  Normal Nutritional Risks: None Diabetes: No  How often do you need to have someone help you when you read instructions, pamphlets, or other written materials from your doctor or pharmacy?: 1 - Never What is the last grade level you completed in school?: College  Diabetic?No  Interpreter Needed?: No  Information entered by :: Caroleen Hamman LPN   Activities of Daily Living In your present state of health, do you have any difficulty performing the following activities: 02/27/2020  Hearing? N  Vision? N  Difficulty concentrating or making decisions? Y  Comment forgets names sometimes  Walking or climbing stairs? N  Dressing or bathing? N  Doing errands, shopping? N  Preparing Food and eating ? N  Using the Toilet? N  In the past six  months, have you accidently leaked urine? N  Do you have problems with loss of bowel control? N  Managing your Medications? N  Managing your Finances? N  Housekeeping or managing your Housekeeping? N  Some recent data might be hidden    Patient Care Team: Libby Maw, MD as PCP - General (Family Medicine)  Indicate any recent Medical Services you may have received from other than [facility] providers in the past year (date may be approximate).     Assessment:   This is a routine wellness examination for Terriah.  Hearing/Vision screen  Hearing Screening   125Hz  250Hz  500Hz  1000Hz  2000Hz  3000Hz  4000Hz  6000Hz  8000Hz   Right ear:           Left ear:           Comments: No issues  Vision Screening Comments: Wears glasses Last eye exam-2021-[facility]   Dietary  issues and exercise activities discussed: Current Exercise Habits: Home exercise routine, Type of exercise: walking, Time (Minutes): 45, Frequency (Times/Week): 5, Weekly Exercise (Minutes/Week): 225, Intensity: Mild  Goals    . Patient Stated     Continue healthy eating plan & walking      Depression Screen PHQ 2/9 Scores 02/27/2020 01/30/2019 11/30/2017  PHQ - 2 Score 0 0 0    Fall Risk Fall Risk  02/27/2020 09/19/2019 01/30/2019 04/17/2018  Falls in the past year? 1 1 0 No  Number falls in past yr: 0 1 - -  Injury with Fall? 0 0 - -  Follow up Falls prevention discussed - Falls evaluation completed -    Any stairs in or around the home? Yes  If so, are there any without handrails? No  Home free of loose throw rugs in walkways, pet beds, electrical cords, etc? Yes  Adequate lighting in your home to reduce risk of falls? Yes   ASSISTIVE DEVICES UTILIZED TO PREVENT FALLS:  Life alert? No  Use of a cane, walker or w/c? No  Grab bars in the bathroom? Yes  Shower chair or bench in shower? Yes  Elevated toilet seat or a handicapped toilet? No   TIMED UP AND GO:  Was the test performed? No . Phone  visit   Cognitive Function: No cognitive impairment noted.     6CIT Screen 02/27/2020  What Year? 0 points  What month? 0 points  What time? 0 points  Count back from 20 0 points  Months in reverse 0 points  Repeat phrase 0 points  Total Score 0    Immunizations Immunization History  Administered Date(s) Administered  . Fluad Quad(high Dose 65+) 03/26/2019  . H1N1 07/01/2008  . Influenza Split 04/18/2011, 06/11/2013  . Influenza, High Dose Seasonal PF 04/17/2018  . Influenza-Unspecified 05/16/2007, 04/10/2008, 05/03/2012, 03/04/2014  . PFIZER SARS-COV-2 Vaccination 07/23/2019, 08/13/2019  . Pneumococcal Conjugate-13 08/15/2013, 04/17/2018  . Pneumococcal Polysaccharide-23 04/29/2004, 07/07/2010  . Td 11/08/2013  . Tdap 03/04/2008  . Zoster 07/17/2007    TDAP status: Up to date   Flu Vaccine status: Up to date   Pneumococcal vaccine status: Up to date   Covid-19 vaccine status: Completed vaccines  Qualifies for Shingles Vaccine? Yes   Zostavax completed Yes   Shingrix Completed?: No.    Education has been provided regarding the importance of this vaccine. Patient has been advised to call insurance company to determine out of pocket expense if they have not yet received this vaccine. Advised may also receive vaccine at local pharmacy or Health Dept. Verbalized acceptance and understanding.  Screening Tests Health Maintenance  Topic Date Due  . Hepatitis C Screening  Never done  . INFLUENZA VACCINE  02/02/2020  . TETANUS/TDAP  11/09/2023  . DEXA SCAN  Completed  . COVID-19 Vaccine  Completed  . PNA vac Low Risk Adult  Completed    Health Maintenance  Health Maintenance Due  Topic Date Due  . Hepatitis C Screening  Never done  . INFLUENZA VACCINE  02/02/2020    Colorectal cancer screening: No longer required.    Mammogram status: Completed 06/10/2019. Repeat every year   Bone Density status: Completed 06/07/2018. Results reflect: Bone density results:  NORMAL. Repeat every 2 years.  Lung Cancer Screening: (Low Dose CT Chest recommended if Age 38-80 years, 30 pack-year currently smoking OR have quit w/in 15years.) does not qualify.    Additional Screening:  Hepatitis C Screening: does qualify; Discuss with PCP  Vision Screening: Recommended annual ophthalmology exams for early detection of glaucoma and other disorders of the eye. Is the patient up to date with their annual eye exam?  Yes  Who is the provider or what is the name of the office in which the patient attends annual eye exams? [facility]   Dental Screening: Recommended annual dental exams for proper oral hygiene  Community Resource Referral / Chronic Care Management: CRR required this visit?  No   CCM required this visit?  No      Plan:     I have personally reviewed and noted the following in the patient's chart:   . Medical and social history . Use of alcohol, tobacco or illicit drugs  . Current medications and supplements . Functional ability and status . Nutritional status . Physical activity . Advanced directives . List of other physicians . Hospitalizations, surgeries, and ER visits in previous 12 months . Vitals . Screenings to include cognitive, depression, and falls . Referrals and appointments  In addition, I have reviewed and discussed with patient certain preventive protocols, quality metrics, and best practice recommendations. A written personalized care plan for preventive services as well as general preventive health recommendations were provided to patient.    Due to this being a telephonic visit, the after visit summary with patients personalized plan was offered to patient via mail or my-chart.  Patient would like to access on my-chart.   Marta Antu, LPN   [id number]  Nurse Health Advisor  Nurse Notes: Patient request refills of Atorvastatin & Losartan. Message sent to PCP. She complains of numbness & tingling in her hands &  feet. Appointment made with Dr. Ethelene Hal.

## 2020-02-27 NOTE — Telephone Encounter (Signed)
Patient is requesting refills of Atorvastatin & Losartan. Uses [facility].

## 2020-02-27 NOTE — Addendum Note (Signed)
Addended by: Jon Billings on: 02/27/2020 01:10 PM   Modules accepted: Orders

## 2020-02-27 NOTE — Telephone Encounter (Signed)
Refill request sent in

## 2020-03-02 ENCOUNTER — Other Ambulatory Visit: Payer: Self-pay

## 2020-03-02 ENCOUNTER — Encounter: Payer: Self-pay | Admitting: Family Medicine

## 2020-03-02 ENCOUNTER — Ambulatory Visit (INDEPENDENT_AMBULATORY_CARE_PROVIDER_SITE_OTHER): Payer: Medicare Other | Admitting: Family Medicine

## 2020-03-02 VITALS — BP 104/72 | HR 93 | Temp 98.3°F | Ht 64.0 in | Wt 133.4 lb

## 2020-03-02 DIAGNOSIS — M7742 Metatarsalgia, left foot: Secondary | ICD-10-CM | POA: Diagnosis not present

## 2020-03-02 DIAGNOSIS — E7800 Pure hypercholesterolemia, unspecified: Secondary | ICD-10-CM | POA: Diagnosis not present

## 2020-03-02 DIAGNOSIS — E538 Deficiency of other specified B group vitamins: Secondary | ICD-10-CM | POA: Diagnosis not present

## 2020-03-02 DIAGNOSIS — M7741 Metatarsalgia, right foot: Secondary | ICD-10-CM | POA: Diagnosis not present

## 2020-03-02 DIAGNOSIS — I10 Essential (primary) hypertension: Secondary | ICD-10-CM

## 2020-03-02 DIAGNOSIS — E039 Hypothyroidism, unspecified: Secondary | ICD-10-CM | POA: Diagnosis not present

## 2020-03-02 DIAGNOSIS — M19049 Primary osteoarthritis, unspecified hand: Secondary | ICD-10-CM

## 2020-03-02 DIAGNOSIS — Q6672 Congenital pes cavus, left foot: Secondary | ICD-10-CM

## 2020-03-02 DIAGNOSIS — Q6671 Congenital pes cavus, right foot: Secondary | ICD-10-CM | POA: Insufficient documentation

## 2020-03-02 HISTORY — DX: Deficiency of other specified B group vitamins: E53.8

## 2020-03-02 MED ORDER — DICLOFENAC SODIUM 1 % EX GEL: CUTANEOUS | 2 refills | Status: DC

## 2020-03-02 MED ORDER — LOSARTAN POTASSIUM 25 MG PO TABS: 25.0000 mg | ORAL_TABLET | Freq: Every day | ORAL | 1 refills | Status: DC

## 2020-03-02 MED ORDER — ATORVASTATIN CALCIUM 20 MG PO TABS: 20.0000 mg | ORAL_TABLET | Freq: Every day | ORAL | 3 refills | Status: DC

## 2020-03-02 NOTE — Progress Notes (Signed)
Established Patient Office Visit  Subjective:  Patient ID: Shelly Pearson, female    DOB: 22-Feb-1941  Age: 79 y.o. MRN: [id number]  CC:  Chief Complaint  Patient presents with   Numbness    pain in both feet feels like she's walking on rocks when walking bare feet. Tripping more than normal. Droping things more often.     HPI Shelly Pearson presents for hypertension, elevated cholesterol multiple arthritic aches and pains.  Blood pressures been well controlled with losartan, cholesterol controlled with atorvastatin.  Continues to take 50 mcg of Synthroid for hypothyroidism.  Taking 1000 mcg of vitamin B12 daily.  She is having pains in her first MCP and MCC's of both hands left greater than right.  She is right-hand dominant.  During her walking year she had used her left hand more.  Has pains in the balls of her feet.  She is mostly sedentary.  She sews and bakes some but not that much.  She has been dropping things more often.  7 hours fasting.  Past Medical History:  Diagnosis Date   GERD (gastroesophageal reflux disease)    Hypertension    Seasonal allergies    Thyroid disease    Urgency incontinence     Past Surgical History:  Procedure Laterality Date   ABDOMINAL HYSTERECTOMY     CESAREAN SECTION     CYSTOCELE REPAIR     ROTATOR CUFF REPAIR Right    2018    Family History  Problem Relation Age of Onset   Stroke Mother    Cancer Father    Cancer Sister    Cancer Brother     Social History   Socioeconomic History   Marital status: Married    Spouse name: Not on file   Number of children: Not on file   Years of education: Not on file   Highest education level: Not on file  Occupational History   Occupation: Retired  Tobacco Use   Smoking status: Never Smoker   Smokeless tobacco: Never Used  Scientific laboratory technician Use: Never used  Substance and Sexual Activity   Alcohol use: No   Drug use: No   Sexual activity: Not Currently    Other Topics Concern   Not on file  Social History Narrative   Not on file   Social Determinants of Health   Financial Resource Strain: Low Risk    Difficulty of Paying Living Expenses: Not hard at all  Food Insecurity: No Food Insecurity   Worried About Charity fundraiser in the Last Year: Never true   Arboriculturist in the Last Year: Never true  Transportation Needs: No Transportation Needs   Lack of Transportation (Medical): No   Lack of Transportation (Non-Medical): No  Physical Activity: Sufficiently Active   Days of Exercise per Week: 5 days   Minutes of Exercise per Session: 40 min  Stress: No Stress Concern Present   Feeling of Stress : Only a little  Social Connections: Engineer, building services of Communication with Friends and Family: More than three times a week   Frequency of Social Gatherings with Friends and Family: Once a week   Attends Religious Services: More than 4 times per year   Active Member of [employer] or Organizations: Yes   Attends Music therapist: More than 4 times per year   Marital Status: Married  Human resources officer Violence: Not At Risk   Fear of Current or  Ex-Partner: No   Emotionally Abused: No   Physically Abused: No   Sexually Abused: No    Outpatient Medications Prior to Visit  Medication Sig Dispense Refill   BIOTIN PO 1 capsule     Cholecalciferol (VITAMIN D3) 2000 UNITS capsule      Cyanocobalamin (VITAMIN B12) 1000 MCG TBCR      famotidine (PEPCID) 40 MG tablet Take 1 tablet (40 mg total) by mouth daily. 90 tablet 4   levothyroxine (SYNTHROID) 50 MCG tablet TAKE 1 TABLET DAILY BEFORE BREAKFAST 90 tablet 3   trospium (SANCTURA) 20 MG tablet TAKE 1 TABLET AT BEDTIME 90 tablet 1   atorvastatin (LIPITOR) 20 MG tablet Take 1 tablet (20 mg total) by mouth daily. 90 tablet 3   losartan (COZAAR) 25 MG tablet Take 1 tablet (25 mg total) by mouth daily. 90 tablet 1   No facility-administered  medications prior to visit.    Allergies  Allergen Reactions   Adhesive  [Tape] Other (See Comments)    ROS Review of Systems  Constitutional: Negative.   HENT: Negative.   Eyes: Negative for photophobia and visual disturbance.  Respiratory: Negative.   Cardiovascular: Negative.   Gastrointestinal: Negative.   Endocrine: Negative for polyphagia and polyuria.  Genitourinary: Negative.   Musculoskeletal: Positive for arthralgias.  Skin: Negative for pallor and rash.  Allergic/Immunologic: Negative for immunocompromised state.  Neurological: Negative for weakness and numbness.  Hematological: Does not bruise/bleed easily.  Psychiatric/Behavioral: Negative.       Objective:    Physical Exam Vitals and nursing note reviewed.  Constitutional:      General: She is not in acute distress.    Appearance: Normal appearance. She is not ill-appearing, toxic-appearing or diaphoretic.  HENT:     Right Ear: External ear normal.     Left Ear: External ear normal.  Eyes:     General: No scleral icterus.       Right eye: No discharge.        Left eye: No discharge.     Extraocular Movements: Extraocular movements intact.     Conjunctiva/sclera: Conjunctivae normal.     Pupils: Pupils are equal, round, and reactive to light.  Cardiovascular:     Rate and Rhythm: Normal rate and regular rhythm.     Pulses:          Dorsalis pedis pulses are 2+ on the right side and 2+ on the left side.       Posterior tibial pulses are 1+ on the right side and 1+ on the left side.  Pulmonary:     Effort: Pulmonary effort is normal.     Breath sounds: Normal breath sounds.  Musculoskeletal:     Right hand: No tenderness or bony tenderness. Normal range of motion. Normal strength.     Left hand: No tenderness or bony tenderness. Normal range of motion. Normal strength.       Arms:     Cervical back: No rigidity or tenderness.     Right foot: No foot drop, tenderness or bony tenderness.     Left  foot: No foot drop, tenderness or bony tenderness.       Legs:  Lymphadenopathy:     Cervical: No cervical adenopathy.  Skin:    General: Skin is warm and dry.  Neurological:     Mental Status: She is alert and oriented to person, place, and time.  Psychiatric:        Mood and Affect: Mood normal.  BP 104/72    Pulse 93    Temp 98.3 F (36.8 C) (Tympanic)    Ht 5\' 4"  (1.626 m)    Wt 133 lb 6.4 oz (60.5 kg)    SpO2 97%    BMI 22.90 kg/m  Wt Readings from Last 3 Encounters:  03/02/20 133 lb 6.4 oz (60.5 kg)  02/27/20 130 lb (59 kg)  09/19/19 139 lb (63 kg)     Health Maintenance Due  Topic Date Due   Hepatitis C Screening  Never done   INFLUENZA VACCINE  02/02/2020    There are no preventive care reminders to display for this patient.  Lab Results  Component Value Date   TSH 0.64 01/30/2019   Lab Results  Component Value Date   WBC 5.6 09/19/2019   HGB 13.8 09/19/2019   HCT 41.6 09/19/2019   MCV 92.3 09/19/2019   PLT 203.0 09/19/2019   Lab Results  Component Value Date   NA 140 09/19/2019   K 4.0 09/19/2019   CO2 30 09/19/2019   GLUCOSE 93 09/19/2019   BUN 14 09/19/2019   CREATININE 0.71 09/19/2019   BILITOT 0.6 01/30/2019   ALKPHOS 62 01/30/2019   AST 17 01/30/2019   ALT 19 01/30/2019   PROT 6.6 01/30/2019   ALBUMIN 4.2 01/30/2019   CALCIUM 9.5 09/19/2019   GFR 79.96 09/19/2019   Lab Results  Component Value Date   CHOL 160 01/30/2019   Lab Results  Component Value Date   HDL 63.80 01/30/2019   Lab Results  Component Value Date   LDLCALC 79 01/30/2019   Lab Results  Component Value Date   TRIG 88.0 01/30/2019   Lab Results  Component Value Date   CHOLHDL 3 01/30/2019   No results found for: HGBA1C    Assessment & Plan:   Problem List Items Addressed This Visit      Cardiovascular and Mediastinum   Essential hypertension - Primary   Relevant Medications   atorvastatin (LIPITOR) 20 MG tablet   losartan (COZAAR) 25 MG tablet    Other Relevant Orders   CBC   Comprehensive metabolic panel   Urinalysis, Routine w reflex microscopic     Endocrine   Hypothyroidism   Relevant Orders   TSH     Musculoskeletal and Integument   Hand arthritis   Relevant Medications   diclofenac Sodium (VOLTAREN) 1 % GEL   Other Relevant Orders   Ambulatory referral to Sports Medicine     Other   Hypercholesterolemia without hypertriglyceridemia   Relevant Medications   atorvastatin (LIPITOR) 20 MG tablet   losartan (COZAAR) 25 MG tablet   Other Relevant Orders   Comprehensive metabolic panel   LDL cholesterol, direct   Lipid panel   B12 deficiency   Relevant Orders   Vitamin B12   Congenital cavus deformity of both feet   Relevant Orders   Ambulatory referral to Sports Medicine   Metatarsalgia of both feet   Relevant Medications   diclofenac Sodium (VOLTAREN) 1 % GEL   Other Relevant Orders   Uric acid   Ambulatory referral to Sports Medicine      Meds ordered this encounter  Medications   atorvastatin (LIPITOR) 20 MG tablet    Sig: Take 1 tablet (20 mg total) by mouth daily.    Dispense:  90 tablet    Refill:  3   losartan (COZAAR) 25 MG tablet    Sig: Take 1 tablet (25 mg total) by mouth daily.  Dispense:  90 tablet    Refill:  1   diclofenac Sodium (VOLTAREN) 1 % GEL    Sig: Apply a small grape sized dollop to sore places on hands and feet up to 4 times daily.    Dispense:  150 g    Refill:  2    Follow-up: Return in about 6 months (around 08/31/2020).   Discussed cavus feet and the need for good arch support heel and toe cushioning.  She will try Voltaren gel for aches and pains in her feet and hands.  Continue current medicines as above. Libby Maw, MD

## 2020-03-03 LAB — URINALYSIS, ROUTINE W REFLEX MICROSCOPIC
Bilirubin Urine: NEGATIVE
Hgb urine dipstick: NEGATIVE
Ketones, ur: NEGATIVE
Leukocytes,Ua: NEGATIVE
Nitrite: NEGATIVE
RBC / HPF: NONE SEEN (ref 0–?)
Specific Gravity, Urine: <=1.005 — AB (ref 1.000–1.030)
Total Protein, Urine: NEGATIVE
Urine Glucose: NEGATIVE
Urobilinogen, UA: 0.2 (ref 0.0–1.0)
WBC, UA: NONE SEEN (ref 0–?)
pH: 5 (ref 5.0–8.0)

## 2020-03-03 LAB — COMPREHENSIVE METABOLIC PANEL
ALT: 17 U/L (ref 0–35)
AST: 17 U/L (ref 0–37)
Albumin: 4.3 g/dL (ref 3.5–5.2)
Alkaline Phosphatase: 67 U/L (ref 39–117)
BUN: 16 mg/dL (ref 6–23)
CO2: 31 mEq/L (ref 19–32)
Calcium: 9.8 mg/dL (ref 8.4–10.5)
Chloride: 104 mEq/L (ref 96–112)
Creatinine, Ser: 0.75 mg/dL (ref 0.40–1.20)
GFR: 74.97 mL/min (ref >60.00)
Glucose, Bld: 84 mg/dL (ref 70–99)
Potassium: 4 mEq/L (ref 3.5–5.1)
Sodium: 140 mEq/L (ref 135–145)
Total Bilirubin: 0.6 mg/dL (ref 0.2–1.2)
Total Protein: 6.4 g/dL (ref 6.0–8.3)

## 2020-03-03 LAB — LIPID PANEL
Cholesterol: 156 mg/dL (ref 0–200)
HDL: 69.4 mg/dL (ref >39.00)
LDL Cholesterol: 62 mg/dL (ref 0–99)
NonHDL: 87.03
Total CHOL/HDL Ratio: 2
Triglycerides: 126 mg/dL (ref 0.0–149.0)
VLDL: 25.2 mg/dL (ref 0.0–40.0)

## 2020-03-03 LAB — CBC
HCT: 40.3 % (ref 36.0–46.0)
Hemoglobin: 13.4 g/dL (ref 12.0–15.0)
MCHC: 33.3 g/dL (ref 30.0–36.0)
MCV: 92.1 fl (ref 78.0–100.0)
Platelets: 205 10*3/uL (ref 150.0–400.0)
RBC: 4.37 Mil/uL (ref 3.87–5.11)
RDW: 12.9 % (ref 11.5–15.5)
WBC: 5.1 10*3/uL (ref 4.0–10.5)

## 2020-03-03 LAB — LDL CHOLESTEROL, DIRECT: Direct LDL: 70 mg/dL

## 2020-03-03 LAB — URIC ACID: Uric Acid, Serum: 4.8 mg/dL (ref 2.4–7.0)

## 2020-03-03 LAB — VITAMIN B12: Vitamin B-12: 1292 pg/mL — ABNORMAL HIGH (ref 211–911)

## 2020-03-03 LAB — TSH: TSH: 1.15 u[IU]/mL (ref 0.35–4.50)

## 2020-03-05 ENCOUNTER — Encounter: Payer: Self-pay | Admitting: Family Medicine

## 2020-03-05 ENCOUNTER — Ambulatory Visit (INDEPENDENT_AMBULATORY_CARE_PROVIDER_SITE_OTHER): Payer: Medicare Other | Admitting: Family Medicine

## 2020-03-05 ENCOUNTER — Other Ambulatory Visit: Payer: Self-pay

## 2020-03-05 VITALS — BP 135/83 | HR 72 | Ht 64.0 in | Wt 134.0 lb

## 2020-03-05 DIAGNOSIS — M7741 Metatarsalgia, right foot: Secondary | ICD-10-CM

## 2020-03-05 DIAGNOSIS — R202 Paresthesia of skin: Secondary | ICD-10-CM | POA: Diagnosis not present

## 2020-03-05 DIAGNOSIS — R531 Weakness: Secondary | ICD-10-CM

## 2020-03-05 DIAGNOSIS — M7742 Metatarsalgia, left foot: Secondary | ICD-10-CM

## 2020-03-05 NOTE — Assessment & Plan Note (Signed)
Has some capsulitis occurring on the plantar aspect with a history of bunionectomy as well as surgery of her hammertoes.  Seems this is likely contributing to some of her changes that she experiences with her feet. -Counseled on home exercise therapy and supportive care. -Placed metatarsal pads. -Could consider custom orthotics.

## 2020-03-05 NOTE — Progress Notes (Signed)
Shelly Pearson - 79 y.o. female MRN [id number]  Date of birth: 23-Nov-1940  SUBJECTIVE:  Including CC & ROS.  Chief Complaint  Patient presents with  . Foot Pain    bilateral    Shelly Pearson is a 79 y.o. female that is presenting with hand pain as well as numbness and altered sensation in her hands.  Is been dropping things it seems to go progressively worse for the past year.  Has a long history of treated hypothyroidism.  She also experiences pain in metatarsal joints with the right being worse than the left.   Review of Systems See HPI   HISTORY: Past Medical, Surgical, Social, and Family History Reviewed & Updated per EMR.   Pertinent Historical Findings include:  Past Medical History:  Diagnosis Date  . GERD (gastroesophageal reflux disease)   . Hypertension   . Seasonal allergies   . Thyroid disease   . Urgency incontinence     Past Surgical History:  Procedure Laterality Date  . ABDOMINAL HYSTERECTOMY    . CESAREAN SECTION    . CYSTOCELE REPAIR    . ROTATOR CUFF REPAIR Right    2018    Family History  Problem Relation Age of Onset  . Stroke Mother   . Cancer Father   . Cancer Sister   . Cancer Brother     Social History   Socioeconomic History  . Marital status: Married    Spouse name: Not on file  . Number of children: Not on file  . Years of education: Not on file  . Highest education level: Not on file  Occupational History  . Occupation: Retired  Tobacco Use  . Smoking status: Never Smoker  . Smokeless tobacco: Never Used  Vaping Use  . Vaping Use: Never used  Substance and Sexual Activity  . Alcohol use: No  . Drug use: No  . Sexual activity: Not Currently  Other Topics Concern  . Not on file  Social History Narrative  . Not on file   Social Determinants of Health   Financial Resource Strain: Low Risk   . Difficulty of Paying Living Expenses: Not hard at all  Food Insecurity: No Food Insecurity  . Worried About Sales executive in the Last Year: Never true  . Ran Out of Food in the Last Year: Never true  Transportation Needs: No Transportation Needs  . Lack of Transportation (Medical): No  . Lack of Transportation (Non-Medical): No  Physical Activity: Sufficiently Active  . Days of Exercise per Week: 5 days  . Minutes of Exercise per Session: 40 min  Stress: No Stress Concern Present  . Feeling of Stress : Only a little  Social Connections: Socially Integrated  . Frequency of Communication with Friends and Family: More than three times a week  . Frequency of Social Gatherings with Friends and Family: Once a week  . Attends Religious Services: More than 4 times per year  . Active Member of Clubs or Organizations: Yes  . Attends Archivist Meetings: More than 4 times per year  . Marital Status: Married  Human resources officer Violence: Not At Risk  . Fear of Current or Ex-Partner: No  . Emotionally Abused: No  . Physically Abused: No  . Sexually Abused: No     PHYSICAL EXAM:  VS: BP 135/83   Pulse 72   Ht 5\' 4"  (1.626 m)   Wt 134 lb (60.8 kg)   BMI 23.00 kg/m  Physical  Exam Gen: NAD, alert, cooperative with exam, well-appearing MSK:  Right and left hand: No signs of atrophy. Normal finger range of motion. Normal grip strength. Right and left foot: No signs of atrophy. Tenderness to palpation over the plantar metatarsal joints. Worse subtalar subluxation of the left.  More pes planus than the right. Neurovascular intact     ASSESSMENT & PLAN:   Metatarsalgia of both feet Has some capsulitis occurring on the plantar aspect with a history of bunionectomy as well as surgery of her hammertoes.  Seems this is likely contributing to some of her changes that she experiences with her feet. -Counseled on home exercise therapy and supportive care. -Placed metatarsal pads. -Could consider custom orthotics.  Paresthesias with subjective weakness Has been experiencing intermittent altered  sensation and dropping things that has been occurring over the past year and seems to be getting progressively worse.  No history of diabetes and has had a normal ferritin and adequate B12.  Is a history of treated hypothyroidism. -Counseled supportive care. -Referral to neurology for EMG and nerve study.

## 2020-03-05 NOTE — Assessment & Plan Note (Signed)
Has been experiencing intermittent altered sensation and dropping things that has been occurring over the past year and seems to be getting progressively worse.  No history of diabetes and has had a normal ferritin and adequate B12.  Is a history of treated hypothyroidism. -Counseled supportive care. -Referral to neurology for EMG and nerve study.

## 2020-03-05 NOTE — Patient Instructions (Signed)
Nice to meet you  Please try the pads  Please try the exercises  The should call about the nerve study  Please send me a message in [place] with any questions or updates.  Please see me back in 4 weeks or after the nerve study.   --Dr. Raeford Razor

## 2020-03-06 ENCOUNTER — Telehealth: Payer: Self-pay | Admitting: Family Medicine

## 2020-03-06 NOTE — Telephone Encounter (Signed)
[facility] @ [phone] no answer / left message on Scheduled dept's [facility] regarding :  Referral, Pt information & orders-- stated info to be faxed to them for scheduling (ask for call back w/questions or scheduling info)  --glh

## 2020-03-10 ENCOUNTER — Telehealth: Payer: Self-pay | Admitting: Family Medicine

## 2020-03-10 NOTE — Telephone Encounter (Signed)
Pt referred out to [facility] for nerve conduction study (hasn't heard from them regard Scheduling appt)--Cld [facility] spk w/ Jenny Reichmann who state they have rcvd referral information & apologized for Patient not being contacted & will reach out for scheduling appt asap.  --Called Pt advised her that [facility] office will be contacting her by 5pm tomorrow for scheduling purpose.   --glh

## 2020-04-01 ENCOUNTER — Other Ambulatory Visit: Payer: Self-pay

## 2020-04-01 ENCOUNTER — Encounter: Payer: Self-pay | Admitting: Neurology

## 2020-04-01 ENCOUNTER — Ambulatory Visit (INDEPENDENT_AMBULATORY_CARE_PROVIDER_SITE_OTHER): Payer: Medicare Other | Admitting: Neurology

## 2020-04-01 DIAGNOSIS — R202 Paresthesia of skin: Secondary | ICD-10-CM

## 2020-04-01 DIAGNOSIS — R29898 Other symptoms and signs involving the musculoskeletal system: Secondary | ICD-10-CM

## 2020-04-01 DIAGNOSIS — R531 Weakness: Secondary | ICD-10-CM

## 2020-04-01 NOTE — Progress Notes (Signed)
[facility]    Nerve / Sites Muscle Latency Ref. Amplitude Ref. Rel Amp Segments Distance Velocity Ref. Area    ms ms mV mV %  cm m/s m/s mVms  L Median - APB     Wrist APB 3.5 ?4.4 7.3 ?4.0 100 Wrist - APB 7   27.2     Upper arm APB 7.2  7.3  100 Upper arm - Wrist 20 55 ?49 26.7  R Median - APB     Wrist APB 3.7 ?4.4 3.4 ?4.0 100 Wrist - APB 7   13.0     Upper arm APB 8.0  3.8  111 Upper arm - Wrist 21 49 ?49 12.1  L Ulnar - ADM     Wrist ADM 2.6 ?3.3 9.1 ?6.0 100 Wrist - ADM 7   30.1     B.Elbow ADM 5.4  8.2  90.9 B.Elbow - Wrist 18 64 ?49 30.0     A.Elbow ADM 6.9  7.7  93.1 A.Elbow - B.Elbow 10 64 ?49 29.7         A.Elbow - Wrist      R Ulnar - ADM     Wrist ADM 2.6 ?3.3 10.3 ?6.0 100 Wrist - ADM 7   33.9     B.Elbow ADM 5.5  8.7  84.3 B.Elbow - Wrist 18 61 ?49 30.8     A.Elbow ADM 7.3  8.5  97.4 A.Elbow - B.Elbow 10 59 ?49 31.3         A.Elbow - Wrist                 SNC    Nerve / Sites Rec. Site Peak Lat Ref.  Amp Ref. Segments Distance    ms ms V V  cm  L Median - Orthodromic (Dig II, Mid palm)     Dig II Wrist 3.1 ?3.4 16 ?10 Dig II - Wrist 13  R Median - Orthodromic (Dig II, Mid palm)     Dig II Wrist 3.3 ?3.4 11 ?10 Dig II - Wrist 13  L Ulnar - Orthodromic, (Dig V, Mid palm)     Dig V Wrist 2.6 ?3.1 12 ?5 Dig V - Wrist 11  R Ulnar - Orthodromic, (Dig V, Mid palm)     Dig V Wrist 2.7 ?3.1 7 ?5 Dig V - Wrist 35             F  Wave    Nerve F Lat Ref.   ms ms  L Ulnar - ADM 26.0 ?32.0  R Ulnar - ADM 26.0 ?32.0

## 2020-04-01 NOTE — Progress Notes (Signed)
Please refer to EMG and nerve conduction procedure note.  

## 2020-04-01 NOTE — Procedures (Signed)
     HISTORY:  Shelly Pearson is a 79 year old patient with a history of bilateral hand weakness and some problem with gait instability and numbness in the feet of the last 6 months.  The patient is dropping things from her left greater than right hand.  She is being evaluated for this issue.  NERVE CONDUCTION STUDIES:  Nerve conduction studies were performed on both upper extremities. The distal motor latencies and motor amplitudes for the median and ulnar nerves were within normal limits, with exception of a slightly low motor amplitude for the right median nerve. The nerve conduction velocities for these nerves were also normal. The sensory latencies for the median and ulnar nerves were normal. The F wave latencies for the ulnar nerves were within normal limits.   EMG STUDIES:  EMG study was performed on the left upper extremity:  The first dorsal interosseous muscle reveals 2 to 4 K units with full recruitment. No fibrillations or positive waves were noted. The abductor pollicis brevis muscle reveals 2 to 4 K units with full recruitment. No fibrillations or positive waves were noted. The extensor indicis proprius muscle reveals 1 to 3 K units with full recruitment. No fibrillations or positive waves were noted. The pronator teres muscle reveals 2 to 3 K units with full recruitment. No fibrillations or positive waves were noted. The biceps muscle reveals 1 to 2 K units with full recruitment. No fibrillations or positive waves were noted. The triceps muscle reveals 2 to 4 K units with full recruitment. No fibrillations or positive waves were noted. The anterior deltoid muscle reveals 2 to 3 K units with full recruitment. No fibrillations or positive waves were noted. The cervical paraspinal muscles were tested at 2 levels. No abnormalities of insertional activity were seen at either level tested. There was poor relaxation.  A limited EMG study was performed on the right upper  extremity:  The first dorsal interosseous muscle reveals 2 to 4 K units with full recruitment. No fibrillations or positive waves were noted. The abductor pollicis brevis muscle reveals 2 to 4 K units with full recruitment. No fibrillations or positive waves were noted. The extensor indicis proprius muscle reveals 1 to 3 K units with full recruitment. No fibrillations or positive waves were noted.   IMPRESSION:  Nerve conduction studies done on both upper extremities were relatively unremarkable, without evidence of a neuropathy.  EMG evaluation of the left upper extremity was unremarkable, without evidence of an overlying cervical radiculopathy.  A limited EMG of the right upper extremity was unremarkable.  Jill Alexanders MD 04/01/2020 1:36 PM  [facility] [address] [place], [place] [address]  Phone [phone] Fax [phone]

## 2020-04-07 ENCOUNTER — Ambulatory Visit (INDEPENDENT_AMBULATORY_CARE_PROVIDER_SITE_OTHER): Payer: Medicare Other | Admitting: Family Medicine

## 2020-04-07 ENCOUNTER — Other Ambulatory Visit: Payer: Self-pay

## 2020-04-07 DIAGNOSIS — M7742 Metatarsalgia, left foot: Secondary | ICD-10-CM | POA: Diagnosis not present

## 2020-04-07 DIAGNOSIS — M180 Bilateral primary osteoarthritis of first carpometacarpal joints: Secondary | ICD-10-CM | POA: Diagnosis not present

## 2020-04-07 DIAGNOSIS — Z23 Encounter for immunization: Secondary | ICD-10-CM | POA: Diagnosis not present

## 2020-04-07 DIAGNOSIS — M7741 Metatarsalgia, right foot: Secondary | ICD-10-CM

## 2020-04-07 NOTE — Patient Instructions (Signed)
Good to see you Please try to work on your stability  Physical therapy should give you a call   Please send me a message in [place] with any questions or updates.  Please see me back in 4 weeks.   --Dr. Raeford Razor

## 2020-04-07 NOTE — Assessment & Plan Note (Addendum)
Emg was unrevealing for abnormality.  Her lack of grip strength and dropping things may be related to the arthritic change in the [facility] joints. -Referral to physical therapy. -Counseled supportive care.

## 2020-04-07 NOTE — Progress Notes (Signed)
Shelly Pearson - 79 y.o. female MRN [id number]  Date of birth: 1941-05-19  SUBJECTIVE:  Including CC & ROS.  No chief complaint on file.   Shelly Pearson is a 79 y.o. female that is following up for EMG of her upper extremities as well as ongoing bilateral altered sensation of her feet.   Review of Systems See HPI   HISTORY: Past Medical, Surgical, Social, and Family History Reviewed & Updated per EMR.   Pertinent Historical Findings include:  Past Medical History:  Diagnosis Date  . GERD (gastroesophageal reflux disease)   . Hypertension   . Seasonal allergies   . Thyroid disease   . Urgency incontinence     Past Surgical History:  Procedure Laterality Date  . ABDOMINAL HYSTERECTOMY    . CESAREAN SECTION    . CYSTOCELE REPAIR    . ROTATOR CUFF REPAIR Right    2018    Family History  Problem Relation Age of Onset  . Stroke Mother   . Cancer Father   . Cancer Sister   . Cancer Brother     Social History   Socioeconomic History  . Marital status: Married    Spouse name: Not on file  . Number of children: Not on file  . Years of education: Not on file  . Highest education level: Not on file  Occupational History  . Occupation: Retired  Tobacco Use  . Smoking status: Never Smoker  . Smokeless tobacco: Never Used  Vaping Use  . Vaping Use: Never used  Substance and Sexual Activity  . Alcohol use: No  . Drug use: No  . Sexual activity: Not Currently  Other Topics Concern  . Not on file  Social History Narrative  . Not on file   Social Determinants of Health   Financial Resource Strain: Low Risk   . Difficulty of Paying Living Expenses: Not hard at all  Food Insecurity: No Food Insecurity  . Worried About Charity fundraiser in the Last Year: Never true  . Ran Out of Food in the Last Year: Never true  Transportation Needs: No Transportation Needs  . Lack of Transportation (Medical): No  . Lack of Transportation (Non-Medical): No  Physical  Activity: Sufficiently Active  . Days of Exercise per Week: 5 days  . Minutes of Exercise per Session: 40 min  Stress: No Stress Concern Present  . Feeling of Stress : Only a little  Social Connections: Socially Integrated  . Frequency of Communication with Friends and Family: More than three times a week  . Frequency of Social Gatherings with Friends and Family: Once a week  . Attends Religious Services: More than 4 times per year  . Active Member of Clubs or Organizations: Yes  . Attends Archivist Meetings: More than 4 times per year  . Marital Status: Married  Human resources officer Violence: Not At Risk  . Fear of Current or Ex-Partner: No  . Emotionally Abused: No  . Physically Abused: No  . Sexually Abused: No     PHYSICAL EXAM:  VS: There were no vitals taken for this visit. Physical Exam Gen: NAD, alert, cooperative with exam, well-appearing MSK:  Left and right foot: Fairly cavus foot. Hammertoes apparent. Callus formation over the metatarsal heads. Neurovascular intact     ASSESSMENT & PLAN:   Arthritis of carpometacarpal (CMC) joint of both thumbs Emg was unrevealing for abnormality.  Her lack of grip strength and dropping things may be related to the  arthritic change in the [facility] joints. -Referral to physical therapy. -Counseled supportive care.    Metatarsalgia of both feet Has altered sensation in the toes.  Symptoms seem more consistent with hammertoe in and structure of the foot.  Normal labs at this point.  EMG of the upper extremities were normal.  Could have a component of Raynaud's. -Continue metatarsal pads -Physical therapy.  She does have some balance issues with instability with 1 leg standing. -Could consider ABI testing or gabapentin.

## 2020-04-08 NOTE — Assessment & Plan Note (Signed)
Has altered sensation in the toes.  Symptoms seem more consistent with hammertoe in and structure of the foot.  Normal labs at this point.  EMG of the upper extremities were normal.  Could have a component of Raynaud's. -Continue metatarsal pads -Physical therapy.  She does have some balance issues with instability with 1 leg standing. -Could consider ABI testing or gabapentin.

## 2020-04-15 ENCOUNTER — Other Ambulatory Visit: Payer: Self-pay

## 2020-04-15 ENCOUNTER — Ambulatory Visit: Payer: Medicare Other | Attending: Family Medicine | Admitting: Physical Therapy

## 2020-04-15 ENCOUNTER — Encounter: Payer: Self-pay | Admitting: Physical Therapy

## 2020-04-15 DIAGNOSIS — M5441 Lumbago with sciatica, right side: Secondary | ICD-10-CM | POA: Insufficient documentation

## 2020-04-15 DIAGNOSIS — M62830 Muscle spasm of back: Secondary | ICD-10-CM | POA: Insufficient documentation

## 2020-04-15 DIAGNOSIS — R262 Difficulty in walking, not elsewhere classified: Secondary | ICD-10-CM | POA: Insufficient documentation

## 2020-04-15 DIAGNOSIS — M6281 Muscle weakness (generalized): Secondary | ICD-10-CM | POA: Diagnosis not present

## 2020-04-15 DIAGNOSIS — R296 Repeated falls: Secondary | ICD-10-CM | POA: Diagnosis not present

## 2020-04-15 DIAGNOSIS — M25542 Pain in joints of left hand: Secondary | ICD-10-CM | POA: Diagnosis not present

## 2020-04-15 NOTE — Therapy (Signed)
[place]. [place], [place], [address] Phone: [phone]   Fax:  [phone]  Physical Therapy Evaluation  [name] MRN: [id number] Date of Birth: [birth year] Referring Provider (PT): Clinton Quant Date: 04/15/2020   PT End of Session - 04/15/20 1159    Visit Number 1    Date for PT Re-Evaluation 06/15/20    PT Start Time 1050    PT Stop Time 1140    PT Time Calculation (min) 50 min    Activity Tolerance Patient tolerated treatment well    Behavior During Therapy [facility] for tasks assessed/performed           Past Medical History:  Diagnosis Date  . GERD (gastroesophageal reflux disease)   . Hypertension   . Seasonal allergies   . Thyroid disease   . Urgency incontinence     Past Surgical History:  Procedure Laterality Date  . ABDOMINAL HYSTERECTOMY    . CESAREAN SECTION    . CYSTOCELE REPAIR    . ROTATOR CUFF REPAIR Right    2018    There were no vitals filed for this visit.    Subjective Assessment - 04/15/20 1051    Subjective Pt reports that she has had a few falls in the past year that she cannot fully explain. States that it felt like her feet were "not where they are supposed to be". Pt has also been dropping things; had EMG study and everything came back normal. Pt reports last fall was a couple of weeks ago and tripped over her feet. Pt states she has had several "close calls."    Pertinent History HTN, GERD    Limitations Walking;Standing;House hold activities    Patient Stated Goals reduce falls; feel more confident walking    Currently in Pain? No/denies   reports some soreness on metatarsal pads             [facility] PT Assessment - 04/15/20 0001      Assessment   Medical Diagnosis frequent falls and hand weakness    Referring Provider (PT) Raeford Razor    Hand Dominance Right    Prior Therapy PT for LBP      Precautions   Precautions None        Balance Screen   Has the patient fallen in the past 6 months Yes    How many times? 4   plus multiple close calls   Has the patient had a decrease in activity level because of a fear of falling?  No    Is the patient reluctant to leave their home because of a fear of falling?  No      Home Environment   Additional Comments has some stairs at home has to go one at a time, normally does housework and some gardening      Prior Function   Level of Independence Independent    Vocation Retired    Biomedical scientist some volunteering in [employer]    Leisure walking about 2-3 miles almost daily      Sensation   Light Touch Impaired by gross assessment    Additional Comments impaired L lateral foot, R plantar surface of big toe       Functional Tests   Functional tests Sit to Stand      Sit to Stand   Comments some hip adduction and difficulty with eccentric control; able to complete 5x STS  without hands. Fatigue by 5th rep      Posture/Postural Control   Posture/Postural Control Postural limitations    Posture Comments stands with posterior lean and increased lumbar lordosis      ROM / Strength   AROM / PROM / Strength AROM;Strength      AROM   Overall AROM Comments B ankle ROM WFL; pain with end range R ankle AROM      Strength   Strength Assessment Site Hand;Hip;Knee;Ankle    Right/Left hand Right;Left    Right Hand Grip (lbs) 40    Left Hand Grip (lbs) 20    Right/Left Hip Right;Left    Right Hip Flexion 4/5    Right Hip Extension 4-/5    Right Hip ABduction 4-/5    Left Hip Flexion 4/5    Left Hip Extension 4-/5    Left Hip ABduction 4/5    Right/Left Knee Right;Left    Right Knee Flexion 5/5    Right Knee Extension 5/5    Left Knee Flexion 5/5    Left Knee Extension 5/5    Right/Left Ankle Right;Left    Right Ankle Dorsiflexion 5/5    Right Ankle Plantar Flexion 5/5    Right Ankle Inversion 4+/5    Right Ankle Eversion 4+/5    Left Ankle Dorsiflexion  5/5    Left Ankle Plantar Flexion 5/5    Left Ankle Inversion 4+/5    Left Ankle Eversion 4+/5      Palpation   Palpation comment mild tenderness to palpation B metatarsals on plantar surface of feet      Ambulation/Gait   Gait Comments decreased foot clearance w/ decreased hip flexion with gait      Standardized Balance Assessment   Standardized Balance Assessment Berg Balance Test;Timed Up and Go Test      Berg Balance Test   Sit to Stand Able to stand without using hands and stabilize independently    Standing Unsupported Able to stand safely 2 minutes    Sitting with Back Unsupported but Feet Supported on Floor or Stool Able to sit safely and securely 2 minutes    Stand to Sit Sits safely with minimal use of hands    Transfers Able to transfer safely, minor use of hands    Standing Unsupported with Eyes Closed Able to stand 10 seconds safely    Standing Unsupported with Feet Together Able to place feet together independently and stand 1 minute safely    From Standing, Reach Forward with Outstretched Arm Can reach forward >12 cm safely (5")    From Standing Position, Pick up Object from Floor Able to pick up shoe safely and easily    From Standing Position, Turn to Look Behind Over each Shoulder Looks behind from both sides and weight shifts well    Turn 360 Degrees Able to turn 360 degrees safely in 4 seconds or less    Standing Unsupported, Alternately Place Feet on Step/Stool Able to stand independently and complete 8 steps >20 seconds    Standing Unsupported, One Foot in Front Able to take small step independently and hold 30 seconds    Standing on One Leg Able to lift leg independently and hold 5-10 seconds    Total Score 51      Timed Up and Go Test   Manual TUG (seconds) 11    TUG Comments WFL; minor swaying when turning around  Objective measurements completed on examination: See above findings.       [facility] PT  Treatment/Exercise - 04/15/20 0001      High Level Balance   High Level Balance Comments tandem stance and SLS at counter B      Exercises   Exercises Hand;Knee/Hip      Knee/Hip Exercises: Standing   Hip Flexion Both;1 set;10 reps    Hip Abduction Both;1 set;10 reps    Hip Extension Both;1 set;10 reps      Knee/Hip Exercises: Seated   Sit to Sand 1 set;10 reps;without UE support      Hand Exercises   Other Hand Exercises ball squeeze power grip, lumbrical grip, 3 finger pinch                  PT Education - 04/15/20 1159    Education Details Pt educated on POC and HEP    Person(s) Educated Patient    Methods Explanation;Demonstration;Handout    Comprehension Verbalized understanding;Returned demonstration            PT Short Term Goals - 04/15/20 1355      PT SHORT TERM GOAL #1   Title independent with initial HEP    Time 2    Period Weeks    Status New    Target Date 04/29/20             PT Long Term Goals - 04/15/20 1355      PT LONG TERM GOAL #1   Title report no additional falls/stumbles    Time 8    Period Weeks    Status New    Target Date 06/10/20      PT LONG TERM GOAL #2   Title independent with advanced HEP for balance/LE    Time 8    Period Weeks    Status New    Target Date 06/10/20      PT LONG TERM GOAL #3   Title increase LE strength to 4+/5    Time 8    Period Weeks    Status New    Target Date 06/10/20      PT LONG TERM GOAL #4   Title increase L grip strength to within </=5 lbs of R    Baseline L: 20 lbs, R: 40 lbs    Time 8    Period Weeks    Status New    Target Date 06/10/20                  Plan - 04/15/20 1301    Clinical Impression Statement Pt presents to clinic with reports of frequent falls over the past 6 months and decreased grip strength in L hand (reports dropping objects). Pt demos some diminished sensation over medial plantar aspect of R foot and L lateral aspect of L foot; potentially  look at monofilament testing if indicated next rx. Pt demos decreased balance in single limb and tandem stance, decreased LE strength, and decreased foot clearance with gait. Pt has removed several rugs from home to reduce fall risk. Pt would benefit from LE strength and balance training to reduce fall risk. Pt also demos significant deficit in grip strength LUE as compared to RUE. Gave hand ex's to address grip strength; pt reports significant arthritis in [facility] joint of L hand. Had negative nerve conduction study. Try to incorporate hand ex's as indicated into PT. Pt would benefit from skilled PT to address the above impairments.  Personal Factors and Comorbidities Age;Comorbidity 1    Comorbidities arthritis,HTN    Examination-Activity Limitations Locomotion Level    Examination-Participation Restrictions Community Activity;Interpersonal Relationship    Stability/Clinical Decision Making Stable/Uncomplicated    Clinical Decision Making Low    Rehab Potential Good    PT Frequency 2x / week    PT Duration 8 weeks    PT Treatment/Interventions ADLs/Self Care Home Management;Electrical Stimulation;Moist Heat;Traction;Ultrasound;Functional mobility training;Stair training;Gait training;Therapeutic activities;Therapeutic exercise;Balance training;Neuromuscular re-education;Manual techniques;Passive range of motion    PT Next Visit Plan balance ex's, LE strength, hand ex's as indicated    PT Home Exercise Plan STS, tandem/SLS at counter, hip abd/ext/flex, ball squeeze ex's    Consulted and Agree with Plan of Care Patient           Patient will benefit from skilled therapeutic intervention in order to improve the following deficits and impairments:  Abnormal gait, Difficulty walking, Decreased safety awareness, Decreased balance, Decreased strength, Impaired sensation  Visit Diagnosis: Frequent falls  Pain in joints of left hand  Muscle weakness (generalized)     Problem List Patient  Active Problem List   Diagnosis Date Noted  . B12 deficiency 03/02/2020  . Congenital cavus deformity of both feet 03/02/2020  . Metatarsalgia of both feet 03/02/2020  . Hand arthritis 03/02/2020  . Arthritis of carpometacarpal (CMC) joint of both thumbs 09/19/2019  . Neuropathy 09/19/2019  . Overactive bladder 09/19/2019  . Irritant contact dermatitis due to drug in contact with skin 07/09/2019  . Sciatica 04/26/2019  . Disturbance of skin sensation 02/04/2019  . Eye twitch 02/04/2019  . Right hip pain 08/01/2018  . Breast pain, left 05/17/2018  . Epidermal cyst 04/17/2018  . Allergic rhinitis 06/23/2015  . Eczema 06/23/2015  . Gastro-esophageal reflux disease without esophagitis 06/23/2015  . Hypercholesterolemia without hypertriglyceridemia 06/23/2015  . Essential hypertension 06/23/2015  . Hypothyroidism 06/23/2015  . Iron deficiency anemia 06/23/2015  . Other seasonal allergic rhinitis 06/23/2015  . Fracture of fibula, left, closed 11/13/2012  . Nondisplaced fracture of fifth right metatarsal bone 11/13/2012   Amador Cunas, PT, DPT Donald Prose Shunte Senseney 04/15/2020, 1:58 PM  [place]. [place], [place], [address] Phone: [phone]   Fax:  [phone]  Name: Shelly Pearson MRN: [id number] Date of Birth: August 13, 1940

## 2020-04-15 NOTE — Patient Instructions (Signed)
Access Code: [email or web address] URL: [email or web address] Date: 04/15/2020 Prepared by: Amador Cunas  Exercises Sit to Stand without Arm Support - 1 x daily - 7 x weekly - 3 sets - 10 reps Standing Hip Abduction with Counter Support - 1 x daily - 7 x weekly - 3 sets - 10 reps Standing Hip Extension with Counter Support - 1 x daily - 7 x weekly - 3 sets - 10 reps Standing March with Counter Support - 1 x daily - 7 x weekly - 3 sets - 10 reps Standing Tandem Balance with Counter Support - 1 x daily - 7 x weekly - 3 sets - 2 reps - 20-30 sec hold Standing Single Leg Stance with Counter Support - 1 x daily - 7 x weekly - 3 sets - 3 reps - 10 sec hold Putty Squeezes - 1 x daily - 7 x weekly - 3 sets - 10 reps 3-Point Pinch with Putty - 1 x daily - 7 x weekly - 3 sets - 10 reps Finger Lumbricals with Putty - 1 x daily - 7 x weekly - 3 sets - 10 reps

## 2020-04-20 ENCOUNTER — Other Ambulatory Visit: Payer: Self-pay

## 2020-04-20 ENCOUNTER — Ambulatory Visit: Payer: Medicare Other | Admitting: Physical Therapy

## 2020-04-20 ENCOUNTER — Encounter: Payer: Self-pay | Admitting: Physical Therapy

## 2020-04-20 DIAGNOSIS — R262 Difficulty in walking, not elsewhere classified: Secondary | ICD-10-CM | POA: Diagnosis not present

## 2020-04-20 DIAGNOSIS — R296 Repeated falls: Secondary | ICD-10-CM

## 2020-04-20 DIAGNOSIS — M25542 Pain in joints of left hand: Secondary | ICD-10-CM

## 2020-04-20 DIAGNOSIS — M5441 Lumbago with sciatica, right side: Secondary | ICD-10-CM | POA: Diagnosis not present

## 2020-04-20 DIAGNOSIS — M6281 Muscle weakness (generalized): Secondary | ICD-10-CM

## 2020-04-20 DIAGNOSIS — M62830 Muscle spasm of back: Secondary | ICD-10-CM | POA: Diagnosis not present

## 2020-04-20 NOTE — Therapy (Signed)
[place]. [place], [place], [address] Phone: [phone]   Fax:  [phone]  Physical Therapy Treatment  [name] MRN: [id number] Date of Birth: 1941/03/20 Referring Provider (PT): Clinton Quant Date: 04/20/2020   PT End of Session - 04/20/20 1428    Visit Number 2    Date for PT Re-Evaluation 06/15/20    PT Start Time 5277    PT Stop Time 1429    PT Time Calculation (min) 164 min    Activity Tolerance Patient tolerated treatment well    Behavior During Therapy [facility] for tasks assessed/performed           Past Medical History:  Diagnosis Date  . GERD (gastroesophageal reflux disease)   . Hypertension   . Seasonal allergies   . Thyroid disease   . Urgency incontinence     Past Surgical History:  Procedure Laterality Date  . ABDOMINAL HYSTERECTOMY    . CESAREAN SECTION    . CYSTOCELE REPAIR    . ROTATOR CUFF REPAIR Right    2018    There were no vitals filed for this visit.   Subjective Assessment - 04/20/20 1351    Subjective Pt reports compliance with HEP. Still walking in the morning, no falls recently    Currently in Pain? No/denies                             [facility] Adult PT Treatment/Exercise - 04/20/20 0001      Balance   Balance Assessed Yes      High Level Balance   High Level Balance Activities Tandem walking    High Level Balance Comments side step on and off airex, On airex eyes open and closed      Lumbar Exercises: Aerobic   Nustep L4 x 6 min      Lumbar Exercises: Machines for Strengthening   Cybex Knee Extension 5lb 2x10     Cybex Knee Flexion 20lb 2x10    Other Lumbar Machine Exercise Rows & Lats 20lb 2x10       Knee/Hip Exercises: Standing   Heel Raises Both;2 sets;10 reps;2 seconds    Other Standing Knee Exercises Alt 6in box taps x10, then on airex 6 in taps 2x10     Other Standing Knee Exercises Heel  raises 2x10 black bar      Hand Exercises   Other Hand Exercises orange egg squeezes LUE 2x10                     PT Short Term Goals - 04/20/20 1351      PT SHORT TERM GOAL #1   Title independent with initial HEP    Status Achieved             PT Long Term Goals - 04/15/20 1355      PT LONG TERM GOAL #1   Title report no additional falls/stumbles    Time 8    Period Weeks    Status New    Target Date 06/10/20      PT LONG TERM GOAL #2   Title independent with advanced HEP for balance/LE    Time 8    Period Weeks    Status New    Target Date 06/10/20      PT LONG TERM GOAL #3   Title increase LE strength to  4+/5    Time 8    Period Weeks    Status New    Target Date 06/10/20      PT LONG TERM GOAL #4   Title increase L grip strength to within </=5 lbs of R    Baseline L: 20 lbs, R: 40 lbs    Time 8    Period Weeks    Status New    Target Date 06/10/20                 Plan - 04/20/20 1431    Clinical Impression Statement Pt tolerated an initial progression to TE well. She had some instability on non compliant surfaces. She reports a pulling sensation in her thumb with egg squeezes. Some postural cue needed with seated rows. Pt did well with curls and extensions.    Personal Factors and Comorbidities Age;Comorbidity 1    Examination-Activity Limitations Locomotion Level    Examination-Participation Restrictions Community Activity;Interpersonal Relationship    Stability/Clinical Decision Making Stable/Uncomplicated    Rehab Potential Good    PT Frequency 2x / week    PT Treatment/Interventions ADLs/Self Care Home Management;Electrical Stimulation;Moist Heat;Traction;Ultrasound;Functional mobility training;Stair training;Gait training;Therapeutic activities;Therapeutic exercise;Balance training;Neuromuscular re-education;Manual techniques;Passive range of motion    PT Next Visit Plan balance ex's, LE strength, hand ex's as indicated            Patient will benefit from skilled therapeutic intervention in order to improve the following deficits and impairments:     Visit Diagnosis: Frequent falls  Pain in joints of left hand  Muscle weakness (generalized)     Problem List Patient Active Problem List   Diagnosis Date Noted  . B12 deficiency 03/02/2020  . Congenital cavus deformity of both feet 03/02/2020  . Metatarsalgia of both feet 03/02/2020  . Hand arthritis 03/02/2020  . Arthritis of carpometacarpal (CMC) joint of both thumbs 09/19/2019  . Neuropathy 09/19/2019  . Overactive bladder 09/19/2019  . Irritant contact dermatitis due to drug in contact with skin 07/09/2019  . Sciatica 04/26/2019  . Disturbance of skin sensation 02/04/2019  . Eye twitch 02/04/2019  . Right hip pain 08/01/2018  . Breast pain, left 05/17/2018  . Epidermal cyst 04/17/2018  . Allergic rhinitis 06/23/2015  . Eczema 06/23/2015  . Gastro-esophageal reflux disease without esophagitis 06/23/2015  . Hypercholesterolemia without hypertriglyceridemia 06/23/2015  . Essential hypertension 06/23/2015  . Hypothyroidism 06/23/2015  . Iron deficiency anemia 06/23/2015  . Other seasonal allergic rhinitis 06/23/2015  . Fracture of fibula, left, closed 11/13/2012  . Nondisplaced fracture of fifth right metatarsal bone 11/13/2012    Scot Jun, PTA 04/20/2020, 2:34 PM  [place]. [place], [place], [address] Phone: [phone]   Fax:  [phone]  Name: Shelly Pearson MRN: [id number] Date of Birth: [birth year]

## 2020-04-22 ENCOUNTER — Other Ambulatory Visit: Payer: Self-pay

## 2020-04-22 ENCOUNTER — Encounter: Payer: Self-pay | Admitting: Physical Therapy

## 2020-04-22 ENCOUNTER — Ambulatory Visit: Payer: Medicare Other | Admitting: Physical Therapy

## 2020-04-22 DIAGNOSIS — M6281 Muscle weakness (generalized): Secondary | ICD-10-CM

## 2020-04-22 DIAGNOSIS — M62830 Muscle spasm of back: Secondary | ICD-10-CM | POA: Diagnosis not present

## 2020-04-22 DIAGNOSIS — R262 Difficulty in walking, not elsewhere classified: Secondary | ICD-10-CM

## 2020-04-22 DIAGNOSIS — R296 Repeated falls: Secondary | ICD-10-CM | POA: Diagnosis not present

## 2020-04-22 DIAGNOSIS — M25542 Pain in joints of left hand: Secondary | ICD-10-CM

## 2020-04-22 DIAGNOSIS — M5441 Lumbago with sciatica, right side: Secondary | ICD-10-CM | POA: Diagnosis not present

## 2020-04-22 NOTE — Therapy (Signed)
[place]. [place], [place], [address] Phone: [phone]   Fax:  [phone]  Physical Therapy Treatment  [name] MRN: [id number] Date of Birth: 1941/01/03 Referring Provider (PT): Clinton Quant Date: 04/22/2020   PT End of Session - 04/22/20 1639    Visit Number 3    Date for PT Re-Evaluation 06/15/20    PT Start Time 1600    PT Stop Time 1641    PT Time Calculation (min) 41 min    Activity Tolerance Patient tolerated treatment well    Behavior During Therapy [facility] for tasks assessed/performed           Past Medical History:  Diagnosis Date  . GERD (gastroesophageal reflux disease)   . Hypertension   . Seasonal allergies   . Thyroid disease   . Urgency incontinence     Past Surgical History:  Procedure Laterality Date  . ABDOMINAL HYSTERECTOMY    . CESAREAN SECTION    . CYSTOCELE REPAIR    . ROTATOR CUFF REPAIR Right    2018    There were no vitals filed for this visit.   Subjective Assessment - 04/22/20 1557    Subjective A little tired today, working at [employer]    Currently in Pain? No/denies                             [facility] Adult PT Treatment/Exercise - 04/22/20 0001      High Level Balance   High Level Balance Activities Side stepping;Tandem walking   On balance beam in II pars      Lumbar Exercises: Aerobic   Nustep L4 x 6 min      Lumbar Exercises: Machines for Strengthening   Cybex Knee Extension 5lb 2x10     Cybex Knee Flexion 20lb 2x10    Leg Press 20lb 2x10     Other Lumbar Machine Exercise Rows & Lats 20lb 2x10       Lumbar Exercises: Standing   Shoulder Extension Strengthening;20 reps;Power [employer];Both    Shoulder Extension Limitations 5      Knee/Hip Exercises: Standing   Other Standing Knee Exercises Heel raises 2x10 black bar      Hand Exercises   Other Hand Exercises orange egg squeezes LUE 2x10;  Finger web 2x10     Other Hand Exercises standing on balance beam marbel pick up and velcro board                     PT Short Term Goals - 04/20/20 1351      PT SHORT TERM GOAL #1   Title independent with initial HEP    Status Achieved             PT Long Term Goals - 04/15/20 1355      PT LONG TERM GOAL #1   Title report no additional falls/stumbles    Time 8    Period Weeks    Status New    Target Date 06/10/20      PT LONG TERM GOAL #2   Title independent with advanced HEP for balance/LE    Time 8    Period Weeks    Status New    Target Date 06/10/20      PT LONG TERM GOAL #3   Title increase LE strength to 4+/5  Time 8    Period Weeks    Status New    Target Date 06/10/20      PT LONG TERM GOAL #4   Title increase L grip strength to within </=5 lbs of R    Baseline L: 20 lbs, R: 40 lbs    Time 8    Period Weeks    Status New    Target Date 06/10/20                 Plan - 04/22/20 1640    Clinical Impression Statement Pt did report some R thumb discomfort with the added hand interventions, she stated it felt like this before therapy. Some difficulty with tandem walking in balance beam. Progressed to leg press without issue. Tactile to prevent trunk leaning with seated rows.    Personal Factors and Comorbidities Age;Comorbidity 1    Comorbidities arthritis,HTN    Examination-Activity Limitations Locomotion Level    Examination-Participation Restrictions Community Activity;Interpersonal Relationship    Stability/Clinical Decision Making Stable/Uncomplicated    Rehab Potential Good    PT Frequency 2x / week    PT Duration 8 weeks    PT Treatment/Interventions ADLs/Self Care Home Management;Electrical Stimulation;Moist Heat;Traction;Ultrasound;Functional mobility training;Stair training;Gait training;Therapeutic activities;Therapeutic exercise;Balance training;Neuromuscular re-education;Manual techniques;Passive range of motion    PT Next  Visit Plan balance ex's, LE strength, hand ex's as indicated           Patient will benefit from skilled therapeutic intervention in order to improve the following deficits and impairments:  Abnormal gait, Difficulty walking, Decreased safety awareness, Decreased balance, Decreased strength, Impaired sensation  Visit Diagnosis: Frequent falls  Pain in joints of left hand  Muscle weakness (generalized)  Acute right-sided low back pain with right-sided sciatica  Difficulty in walking, not elsewhere classified  Muscle spasm of back     Problem List Patient Active Problem List   Diagnosis Date Noted  . B12 deficiency 03/02/2020  . Congenital cavus deformity of both feet 03/02/2020  . Metatarsalgia of both feet 03/02/2020  . Hand arthritis 03/02/2020  . Arthritis of carpometacarpal (CMC) joint of both thumbs 09/19/2019  . Neuropathy 09/19/2019  . Overactive bladder 09/19/2019  . Irritant contact dermatitis due to drug in contact with skin 07/09/2019  . Sciatica 04/26/2019  . Disturbance of skin sensation 02/04/2019  . Eye twitch 02/04/2019  . Right hip pain 08/01/2018  . Breast pain, left 05/17/2018  . Epidermal cyst 04/17/2018  . Allergic rhinitis 06/23/2015  . Eczema 06/23/2015  . Gastro-esophageal reflux disease without esophagitis 06/23/2015  . Hypercholesterolemia without hypertriglyceridemia 06/23/2015  . Essential hypertension 06/23/2015  . Hypothyroidism 06/23/2015  . Iron deficiency anemia 06/23/2015  . Other seasonal allergic rhinitis 06/23/2015  . Fracture of fibula, left, closed 11/13/2012  . Nondisplaced fracture of fifth right metatarsal bone 11/13/2012    Scot Jun, PTA 04/22/2020, 4:43 PM  [place]. [place], [place], [address] Phone: [phone]   Fax:  [phone]  Name: Shelly Pearson MRN: [id number] Date of Birth: 07/08/1940

## 2020-04-28 ENCOUNTER — Ambulatory Visit: Payer: Medicare Other | Admitting: Physical Therapy

## 2020-04-28 ENCOUNTER — Encounter: Payer: Self-pay | Admitting: Physical Therapy

## 2020-04-28 ENCOUNTER — Other Ambulatory Visit: Payer: Self-pay

## 2020-04-28 DIAGNOSIS — R262 Difficulty in walking, not elsewhere classified: Secondary | ICD-10-CM | POA: Diagnosis not present

## 2020-04-28 DIAGNOSIS — M62830 Muscle spasm of back: Secondary | ICD-10-CM | POA: Diagnosis not present

## 2020-04-28 DIAGNOSIS — M5441 Lumbago with sciatica, right side: Secondary | ICD-10-CM | POA: Diagnosis not present

## 2020-04-28 DIAGNOSIS — R296 Repeated falls: Secondary | ICD-10-CM

## 2020-04-28 DIAGNOSIS — M6281 Muscle weakness (generalized): Secondary | ICD-10-CM

## 2020-04-28 DIAGNOSIS — M25542 Pain in joints of left hand: Secondary | ICD-10-CM | POA: Diagnosis not present

## 2020-04-28 NOTE — Therapy (Signed)
[place] [place], [place], [address] Phone: [phone]   Fax:  [phone]  Physical Therapy Treatment  [name] MRN: [id number] Date of Birth: [birth year] Referring Provider (PT): Clinton Quant Date: 04/28/2020   PT End of Session - 04/28/20 1425    Visit Number 4    Date for PT Re-Evaluation 06/15/20    PT Start Time 8527    PT Stop Time 1426    PT Time Calculation (min) 30 min    Activity Tolerance Patient tolerated treatment well    Behavior During Therapy [facility] for tasks assessed/performed           Past Medical History:  Diagnosis Date  . GERD (gastroesophageal reflux disease)   . Hypertension   . Seasonal allergies   . Thyroid disease   . Urgency incontinence     Past Surgical History:  Procedure Laterality Date  . ABDOMINAL HYSTERECTOMY    . CESAREAN SECTION    . CYSTOCELE REPAIR    . ROTATOR CUFF REPAIR Right    2018    There were no vitals filed for this visit.   Subjective Assessment - 04/28/20 1355    Subjective L hand is really hurting after PT. Reports that she is doing good right now    Currently in Pain? No/denies                             [facility] Adult PT Treatment/Exercise - 04/28/20 0001      High Level Balance   High Level Balance Activities Side stepping;Tandem walking   on balance beam in II bars    High Level Balance Comments forward and side step over foam roll      Lumbar Exercises: Aerobic   Nustep L4 x 6 min      Lumbar Exercises: Machines for Strengthening   Cybex Knee Extension 10lb 2x10     Cybex Knee Flexion 25lb 2x10    Other Lumbar Machine Exercise Rows & Lats 20lb 2x10       Knee/Hip Exercises: Standing   Other Standing Knee Exercises 6in step ups x 10 each    Other Standing Knee Exercises Lateral 6in step ups x10 each                     PT Short Term Goals - 04/20/20 1351       PT SHORT TERM GOAL #1   Title independent with initial HEP    Status Achieved             PT Long Term Goals - 04/15/20 1355      PT LONG TERM GOAL #1   Title report no additional falls/stumbles    Time 8    Period Weeks    Status New    Target Date 06/10/20      PT LONG TERM GOAL #2   Title independent with advanced HEP for balance/LE    Time 8    Period Weeks    Status New    Target Date 06/10/20      PT LONG TERM GOAL #3   Title increase LE strength to 4+/5    Time 8    Period Weeks    Status New    Target Date 06/10/20      PT LONG TERM GOAL #4   Title increase L  grip strength to within </=5 lbs of R    Baseline L: 20 lbs, R: 40 lbs    Time 8    Period Weeks    Status New    Target Date 06/10/20                 Plan - 04/28/20 1426    Clinical Impression Statement Pt ~ 9 minutes late for today's treatment. She reports some hand pain after therapy so interventions thats focused on had was avoided. Difficulty remains with tandem walking and side step on balance beam.    Personal Factors and Comorbidities Age;Comorbidity 1    Comorbidities arthritis,HTN    Examination-Activity Limitations Locomotion Level    Examination-Participation Restrictions Community Activity;Interpersonal Relationship    Stability/Clinical Decision Making Stable/Uncomplicated    Rehab Potential Good    PT Frequency 2x / week    PT Duration 8 weeks    PT Treatment/Interventions ADLs/Self Care Home Management;Electrical Stimulation;Moist Heat;Traction;Ultrasound;Functional mobility training;Stair training;Gait training;Therapeutic activities;Therapeutic exercise;Balance training;Neuromuscular re-education;Manual techniques;Passive range of motion    PT Next Visit Plan balance ex's, LE strength, hand ex's as indicated           Patient will benefit from skilled therapeutic intervention in order to improve the following deficits and impairments:  Abnormal gait, Difficulty walking,  Decreased safety awareness, Decreased balance, Decreased strength, Impaired sensation  Visit Diagnosis: Frequent falls  Pain in joints of left hand  Muscle weakness (generalized)     Problem List Patient Active Problem List   Diagnosis Date Noted  . B12 deficiency 03/02/2020  . Congenital cavus deformity of both feet 03/02/2020  . Metatarsalgia of both feet 03/02/2020  . Hand arthritis 03/02/2020  . Arthritis of carpometacarpal (CMC) joint of both thumbs 09/19/2019  . Neuropathy 09/19/2019  . Overactive bladder 09/19/2019  . Irritant contact dermatitis due to drug in contact with skin 07/09/2019  . Sciatica 04/26/2019  . Disturbance of skin sensation 02/04/2019  . Eye twitch 02/04/2019  . Right hip pain 08/01/2018  . Breast pain, left 05/17/2018  . Epidermal cyst 04/17/2018  . Allergic rhinitis 06/23/2015  . Eczema 06/23/2015  . Gastro-esophageal reflux disease without esophagitis 06/23/2015  . Hypercholesterolemia without hypertriglyceridemia 06/23/2015  . Essential hypertension 06/23/2015  . Hypothyroidism 06/23/2015  . Iron deficiency anemia 06/23/2015  . Other seasonal allergic rhinitis 06/23/2015  . Fracture of fibula, left, closed 11/13/2012  . Nondisplaced fracture of fifth right metatarsal bone 11/13/2012    Scot Jun, PTA 04/28/2020, 2:29 PM  [place]. [place], [place], [address] Phone: [phone]   Fax:  [phone]  Name: KIYAH DEMARTINI MRN: [id number] Date of Birth: 07/03/1941

## 2020-04-30 ENCOUNTER — Ambulatory Visit: Payer: Medicare Other | Admitting: Physical Therapy

## 2020-04-30 ENCOUNTER — Other Ambulatory Visit: Payer: Self-pay

## 2020-04-30 ENCOUNTER — Encounter: Payer: Self-pay | Admitting: Physical Therapy

## 2020-04-30 DIAGNOSIS — M25542 Pain in joints of left hand: Secondary | ICD-10-CM | POA: Diagnosis not present

## 2020-04-30 DIAGNOSIS — M5441 Lumbago with sciatica, right side: Secondary | ICD-10-CM | POA: Diagnosis not present

## 2020-04-30 DIAGNOSIS — M6281 Muscle weakness (generalized): Secondary | ICD-10-CM

## 2020-04-30 DIAGNOSIS — R296 Repeated falls: Secondary | ICD-10-CM | POA: Diagnosis not present

## 2020-04-30 DIAGNOSIS — R262 Difficulty in walking, not elsewhere classified: Secondary | ICD-10-CM | POA: Diagnosis not present

## 2020-04-30 DIAGNOSIS — M62830 Muscle spasm of back: Secondary | ICD-10-CM | POA: Diagnosis not present

## 2020-04-30 NOTE — Therapy (Signed)
[place]. [place], [place], [address] Phone: [phone]   Fax:  [phone]  Physical Therapy Treatment  [name] MRN: [id number] Date of Birth: 1940-11-28 Referring Provider (PT): Clinton Quant Date: 04/30/2020   PT End of Session - 04/30/20 1426    Visit Number 5    Date for PT Re-Evaluation 06/15/20    PT Start Time 1345    PT Stop Time 1427    PT Time Calculation (min) 42 min    Activity Tolerance Patient tolerated treatment well    Behavior During Therapy [facility] for tasks assessed/performed           Past Medical History:  Diagnosis Date  . GERD (gastroesophageal reflux disease)   . Hypertension   . Seasonal allergies   . Thyroid disease   . Urgency incontinence     Past Surgical History:  Procedure Laterality Date  . ABDOMINAL HYSTERECTOMY    . CESAREAN SECTION    . CYSTOCELE REPAIR    . ROTATOR CUFF REPAIR Right    2018    There were no vitals filed for this visit.   Subjective Assessment - 04/30/20 1348    Subjective Ok, R foot on the bottom is sore for some reason.    Currently in Pain? Yes    Pain Score 3     Pain Location Foot    Pain Orientation Right                             [facility] Adult PT Treatment/Exercise - 04/30/20 0001      High Level Balance   High Level Balance Activities Side stepping;Tandem walking;Other (comment)   on soft balence beam   High Level Balance Comments Side step over foam roll onto airex,       Lumbar Exercises: Aerobic   Recumbent Bike L1.6 x65mn    Nustep L4 x 6 min      Lumbar Exercises: Machines for Strengthening   Cybex Knee Extension 10lb 2x10    Cybex Knee Flexion 25lb 2x15    Other Lumbar Machine Exercise Rows & Lats 25lb 2x10       Knee/Hip Exercises: Standing   Other Standing Knee Exercises on airex 8 in taps 2x10, Then lateral tap x10      Other Standing Knee Exercises  Standing marches on balence beam                    PT Short Term Goals - 04/30/20 1426      PT SHORT TERM GOAL #1   Title independent with initial HEP    Status Achieved             PT Long Term Goals - 04/30/20 1351      PT LONG TERM GOAL #1   Title report no additional falls/stumbles    Status Partially Met                 Plan - 04/30/20 1427    Clinical Impression Statement Pt did well today overall. She was able to tolerated increase resistance with seated rows and lats. No reports of pain. She did well overall with balance activities. Some difficulty with tandem walking on soft balance beam.    Personal Factors and Comorbidities Age;Comorbidity 1    Comorbidities arthritis,HTN    Examination-Activity Limitations Locomotion Level  Examination-Participation Restrictions Community Activity;Interpersonal Relationship    Stability/Clinical Decision Making Stable/Uncomplicated    Rehab Potential Good    PT Frequency 2x / week    PT Duration 8 weeks    PT Treatment/Interventions ADLs/Self Care Home Management;Electrical Stimulation;Moist Heat;Traction;Ultrasound;Functional mobility training;Stair training;Gait training;Therapeutic activities;Therapeutic exercise;Balance training;Neuromuscular re-education;Manual techniques;Passive range of motion    PT Next Visit Plan balance ex's, LE strength, hand ex's as indicated           Patient will benefit from skilled therapeutic intervention in order to improve the following deficits and impairments:  Abnormal gait, Difficulty walking, Decreased safety awareness, Decreased balance, Decreased strength, Impaired sensation  Visit Diagnosis: Frequent falls  Pain in joints of left hand  Muscle weakness (generalized)     Problem List Patient Active Problem List   Diagnosis Date Noted  . B12 deficiency 03/02/2020  . Congenital cavus deformity of both feet 03/02/2020  . Metatarsalgia of both feet  03/02/2020  . Hand arthritis 03/02/2020  . Arthritis of carpometacarpal (CMC) joint of both thumbs 09/19/2019  . Neuropathy 09/19/2019  . Overactive bladder 09/19/2019  . Irritant contact dermatitis due to drug in contact with skin 07/09/2019  . Sciatica 04/26/2019  . Disturbance of skin sensation 02/04/2019  . Eye twitch 02/04/2019  . Right hip pain 08/01/2018  . Breast pain, left 05/17/2018  . Epidermal cyst 04/17/2018  . Allergic rhinitis 06/23/2015  . Eczema 06/23/2015  . Gastro-esophageal reflux disease without esophagitis 06/23/2015  . Hypercholesterolemia without hypertriglyceridemia 06/23/2015  . Essential hypertension 06/23/2015  . Hypothyroidism 06/23/2015  . Iron deficiency anemia 06/23/2015  . Other seasonal allergic rhinitis 06/23/2015  . Fracture of fibula, left, closed 11/13/2012  . Nondisplaced fracture of fifth right metatarsal bone 11/13/2012    Scot Jun, PTA 04/30/2020, 2:32 PM  [place]. [place], [place], [address] Phone: [phone]   Fax:  [phone]  Name: Shelly Pearson MRN: [id number] Date of Birth: 1940-11-01

## 2020-05-11 ENCOUNTER — Other Ambulatory Visit: Payer: Self-pay

## 2020-05-11 ENCOUNTER — Encounter: Payer: Self-pay | Admitting: Physical Therapy

## 2020-05-11 ENCOUNTER — Ambulatory Visit: Payer: Medicare Other | Attending: Family Medicine | Admitting: Physical Therapy

## 2020-05-11 DIAGNOSIS — R296 Repeated falls: Secondary | ICD-10-CM | POA: Insufficient documentation

## 2020-05-11 DIAGNOSIS — M6281 Muscle weakness (generalized): Secondary | ICD-10-CM | POA: Insufficient documentation

## 2020-05-11 DIAGNOSIS — M25542 Pain in joints of left hand: Secondary | ICD-10-CM | POA: Insufficient documentation

## 2020-05-11 DIAGNOSIS — R262 Difficulty in walking, not elsewhere classified: Secondary | ICD-10-CM | POA: Diagnosis not present

## 2020-05-11 NOTE — Therapy (Signed)
[place]. [place], [place], [address] Phone: [phone]   Fax:  [phone]  Physical Therapy Treatment  [name] MRN: [id number] Date of Birth: 1940-12-03 Referring Provider (PT): Clinton Quant Date: 05/11/2020   PT End of Session - 05/11/20 1432    Visit Number 6    Date for PT Re-Evaluation 06/15/20    PT Start Time 1346    PT Stop Time 1430    PT Time Calculation (min) 44 min    Activity Tolerance Patient tolerated treatment well    Behavior During Therapy [facility] for tasks assessed/performed           Past Medical History:  Diagnosis Date  . GERD (gastroesophageal reflux disease)   . Hypertension   . Seasonal allergies   . Thyroid disease   . Urgency incontinence     Past Surgical History:  Procedure Laterality Date  . ABDOMINAL HYSTERECTOMY    . CESAREAN SECTION    . CYSTOCELE REPAIR    . ROTATOR CUFF REPAIR Right    2018    There were no vitals filed for this visit.   Subjective Assessment - 05/11/20 1400    Subjective Reported a fall that happened yesterday. She said she tripped over a speed bump that was the same color as the road. Reported carrying stuff in her arms and also that the sun was in her eyes when she fell. She brusied up her right knee and right hand. Did not go to the Dr. for her fall.    Currently in Pain? No/denies                             [facility] Adult PT Treatment/Exercise - 05/11/20 0001      High Level Balance   High Level Balance Activities Tandem walking;Negotiating over obstacles    High Level Balance Comments Side stepping/monster walks R theraband, tandem stance on airex 2x30" ea., high marches on airex x20      Lumbar Exercises: Aerobic   Recumbent Bike L1 43mns    Nustep L5 640ms      Knee/Hip Exercises: Standing   Lateral Step Up 15 reps;Step Height: 4"    Forward Step Up 15 reps;Step Height:  4";Both    Other Standing Knee Exercises Mini squats 2x10                    PT Short Term Goals - 04/30/20 1426      PT SHORT TERM GOAL #1   Title independent with initial HEP    Status Achieved             PT Long Term Goals - 04/30/20 1351      PT LONG TERM GOAL #1   Title report no additional falls/stumbles    Status Partially Met                 Plan - 05/11/20 1433    Clinical Impression Statement Patient did well today. Had to modify some exercises due to stiffness in L knee from recent fall. Patient favored L side when performing mini squats and also had a pronated L foot, corrected by instructing patient to shift her weight over her right leg before squatting down to evenly distribute her weight in Bil LE's. She was challenged with higher level balance activties but overall did well with  no eposodes of LOB.    PT Treatment/Interventions ADLs/Self Care Home Management;Electrical Stimulation;Moist Heat;Traction;Ultrasound;Functional mobility training;Stair training;Gait training;Therapeutic activities;Therapeutic exercise;Balance training;Neuromuscular re-education;Manual techniques;Passive range of motion    PT Next Visit Plan balance ex's, LE strength, hand ex's as indicated           Patient will benefit from skilled therapeutic intervention in order to improve the following deficits and impairments:  Abnormal gait, Difficulty walking, Decreased safety awareness, Decreased balance, Decreased strength, Impaired sensation  Visit Diagnosis: Frequent falls  Muscle weakness (generalized)  Pain in joints of left hand  Difficulty in walking, not elsewhere classified     Problem List Patient Active Problem List   Diagnosis Date Noted  . B12 deficiency 03/02/2020  . Congenital cavus deformity of both feet 03/02/2020  . Metatarsalgia of both feet 03/02/2020  . Hand arthritis 03/02/2020  . Arthritis of carpometacarpal (CMC) joint of both thumbs  09/19/2019  . Neuropathy 09/19/2019  . Overactive bladder 09/19/2019  . Irritant contact dermatitis due to drug in contact with skin 07/09/2019  . Sciatica 04/26/2019  . Disturbance of skin sensation 02/04/2019  . Eye twitch 02/04/2019  . Right hip pain 08/01/2018  . Breast pain, left 05/17/2018  . Epidermal cyst 04/17/2018  . Allergic rhinitis 06/23/2015  . Eczema 06/23/2015  . Gastro-esophageal reflux disease without esophagitis 06/23/2015  . Hypercholesterolemia without hypertriglyceridemia 06/23/2015  . Essential hypertension 06/23/2015  . Hypothyroidism 06/23/2015  . Iron deficiency anemia 06/23/2015  . Other seasonal allergic rhinitis 06/23/2015  . Fracture of fibula, left, closed 11/13/2012  . Nondisplaced fracture of fifth right metatarsal bone 11/13/2012    Lavenia Atlas, SPTA 05/11/2020, 2:40 PM  [place]. [place], [place], [address] Phone: [phone]   Fax:  [phone]  Name: Shelly Pearson MRN: [id number] Date of Birth: 07-07-1940

## 2020-05-13 ENCOUNTER — Other Ambulatory Visit: Payer: Self-pay

## 2020-05-13 ENCOUNTER — Encounter: Payer: Self-pay | Admitting: Family Medicine

## 2020-05-13 ENCOUNTER — Ambulatory Visit (INDEPENDENT_AMBULATORY_CARE_PROVIDER_SITE_OTHER): Payer: Medicare Other | Admitting: Family Medicine

## 2020-05-13 DIAGNOSIS — M180 Bilateral primary osteoarthritis of first carpometacarpal joints: Secondary | ICD-10-CM

## 2020-05-13 DIAGNOSIS — R202 Paresthesia of skin: Secondary | ICD-10-CM

## 2020-05-13 NOTE — Progress Notes (Signed)
Shelly Pearson - 79 y.o. female MRN [id number]  Date of birth: 11/18/1940  SUBJECTIVE:  Including CC & ROS.  Chief Complaint  Patient presents with  . Follow-up    bilateral foot    Shelly Pearson is a 79 y.o. female that is following up for the altered sensation in her hands and feet.  She has been accommodating with caring things differently in order to stop from dropping objects.  Physical therapy is helping with her balance.   Review of Systems See HPI   HISTORY: Past Medical, Surgical, Social, and Family History Reviewed & Updated per EMR.   Pertinent Historical Findings include:  Past Medical History:  Diagnosis Date  . GERD (gastroesophageal reflux disease)   . Hypertension   . Seasonal allergies   . Thyroid disease   . Urgency incontinence     Past Surgical History:  Procedure Laterality Date  . ABDOMINAL HYSTERECTOMY    . CESAREAN SECTION    . CYSTOCELE REPAIR    . ROTATOR CUFF REPAIR Right    2018    Family History  Problem Relation Age of Onset  . Stroke Mother   . Cancer Father   . Cancer Sister   . Cancer Brother     Social History   Socioeconomic History  . Marital status: Married    Spouse name: Not on file  . Number of children: Not on file  . Years of education: Not on file  . Highest education level: Not on file  Occupational History  . Occupation: Retired  Tobacco Use  . Smoking status: Never Smoker  . Smokeless tobacco: Never Used  Vaping Use  . Vaping Use: Never used  Substance and Sexual Activity  . Alcohol use: No  . Drug use: No  . Sexual activity: Not Currently  Other Topics Concern  . Not on file  Social History Narrative  . Not on file   Social Determinants of Health   Financial Resource Strain: Low Risk   . Difficulty of Paying Living Expenses: Not hard at all  Food Insecurity: No Food Insecurity  . Worried About Charity fundraiser in the Last Year: Never true  . Ran Out of Food in the Last Year: Never true    Transportation Needs: No Transportation Needs  . Lack of Transportation (Medical): No  . Lack of Transportation (Non-Medical): No  Physical Activity: Sufficiently Active  . Days of Exercise per Week: 5 days  . Minutes of Exercise per Session: 40 min  Stress: No Stress Concern Present  . Feeling of Stress : Only a little  Social Connections: Socially Integrated  . Frequency of Communication with Friends and Family: More than three times a week  . Frequency of Social Gatherings with Friends and Family: Once a week  . Attends Religious Services: More than 4 times per year  . Active Member of Clubs or Organizations: Yes  . Attends Archivist Meetings: More than 4 times per year  . Marital Status: Married  Human resources officer Violence: Not At Risk  . Fear of Current or Ex-Partner: No  . Emotionally Abused: No  . Physically Abused: No  . Sexually Abused: No     PHYSICAL EXAM:  VS: BP 115/82   Pulse 80   Ht 5\' 4"  (1.626 m)   Wt 130 lb (59 kg)   BMI 22.31 kg/m  Physical Exam Gen: NAD, alert, cooperative with exam, well-appearing    ASSESSMENT & PLAN:   Paresthesia  of both feet Has been unrevealing for any specific abnormality.  EMG study was normal in the upper extremities.  She is still having these ongoing sensations. -Would consider neurology referral at this point.  Arthritis of carpometacarpal [facility]) joint of both thumbs Has been going through physical therapy but her symptoms are still ongoing.  She will seems more neurological as opposed to being related to arthritic.  However the EMG was normal. -Would consider referral to neurology.

## 2020-05-13 NOTE — Assessment & Plan Note (Signed)
Has been going through physical therapy but her symptoms are still ongoing.  She will seems more neurological as opposed to being related to arthritic.  However the EMG was normal. -Would consider referral to neurology.

## 2020-05-13 NOTE — Patient Instructions (Signed)
Good to see you  Let me know the neurologist you would want to see  Happy Early Birthday!  Please send me a message in MyChart with any questions or updates.  Please see [place] back as needed.   --Dr. Raeford Razor

## 2020-05-13 NOTE — Assessment & Plan Note (Signed)
Has been unrevealing for any specific abnormality.  EMG study was normal in the upper extremities.  She is still having these ongoing sensations. -Would consider neurology referral at this point.

## 2020-05-14 ENCOUNTER — Ambulatory Visit: Payer: Medicare Other | Admitting: Physical Therapy

## 2020-05-14 ENCOUNTER — Encounter: Payer: Self-pay | Admitting: Physical Therapy

## 2020-05-14 DIAGNOSIS — R296 Repeated falls: Secondary | ICD-10-CM

## 2020-05-14 DIAGNOSIS — R262 Difficulty in walking, not elsewhere classified: Secondary | ICD-10-CM

## 2020-05-14 DIAGNOSIS — M6281 Muscle weakness (generalized): Secondary | ICD-10-CM | POA: Diagnosis not present

## 2020-05-14 DIAGNOSIS — M25542 Pain in joints of left hand: Secondary | ICD-10-CM

## 2020-05-14 NOTE — Therapy (Signed)
[place]. [place], [place], [address] Phone: [phone]   Fax:  [phone]  Physical Therapy Treatment  [name] MRN: [id number] Date of Birth: February 14, 1941 Referring Provider (PT): Clinton Quant Date: 05/14/2020   PT End of Session - 05/14/20 1505    Visit Number 7    Date for PT Re-Evaluation 06/15/20    PT Start Time 1400    PT Stop Time 1448    PT Time Calculation (min) 48 min    Activity Tolerance Patient tolerated treatment well    Behavior During Therapy [facility] for tasks assessed/performed           Past Medical History:  Diagnosis Date  . GERD (gastroesophageal reflux disease)   . Hypertension   . Seasonal allergies   . Thyroid disease   . Urgency incontinence     Past Surgical History:  Procedure Laterality Date  . ABDOMINAL HYSTERECTOMY    . CESAREAN SECTION    . CYSTOCELE REPAIR    . ROTATOR CUFF REPAIR Right    2018    There were no vitals filed for this visit.   Subjective Assessment - 05/14/20 1401    Subjective Pt reports no new falls. Is concerned about the sensation issues in her feet; received referral to neurology. Pt reports stiffness/soreness from fall the other day is easing up.    Currently in Pain? No/denies    Pain Score 0-No pain                             [facility] Adult PT Treatment/Exercise - 05/14/20 0001      High Level Balance   High Level Balance Comments sidestepping on foam balance beam; tandem stance on foam, fwd/bkwd walking      Lumbar Exercises: Aerobic   Recumbent Bike L2 x 6 min    Nustep L5 x 6 min      Lumbar Exercises: Machines for Strengthening   Cybex Knee Extension 10lb 2x10    Cybex Knee Flexion 25lb 2x15    Other Lumbar Machine Exercise standing shoulder ext 10# 2x10    Other Lumbar Machine Exercise Rows & Lats 25lb 2x10                     PT Short Term Goals - 04/30/20  1426      PT SHORT TERM GOAL #1   Title independent with initial HEP    Status Achieved             PT Long Term Goals - 05/14/20 1507      PT LONG TERM GOAL #1   Title report no additional falls/stumbles    Status Partially Met      PT LONG TERM GOAL #2   Title independent with advanced HEP for balance/LE    Status Partially Met      PT LONG TERM GOAL #3   Title increase LE strength to 4+/5    Status Partially Met      PT LONG TERM GOAL #4   Title increase L grip strength to within </=5 lbs of R    Status On-going      PT LONG TERM GOAL #5   Title stand to cook a meal without pain > 4/10    Status On-going  Plan - 05/14/20 1505    Clinical Impression Statement Pt reporting some residual pain/stiffness in R knee following fall; states overall getting better. Pt demos good stability with balance ex's this rx; moderate difficulty and CGA for balance activities on foam with EC. CGA for resisted gait with sidestepping. Able to tolerate all machine level interventions with no increase in pain. Pt requested to be put on hold for a couple of weeks pending consult with neurologist for sensation deficits in B hands/feet.    PT Treatment/Interventions ADLs/Self Care Home Management;Electrical Stimulation;Moist Heat;Traction;Ultrasound;Functional mobility training;Stair training;Gait training;Therapeutic activities;Therapeutic exercise;Balance training;Neuromuscular re-education;Manual techniques;Passive range of motion    PT Next Visit Plan balance ex's, LE strength, hand ex's as indicated    Consulted and Agree with Plan of Care Patient           Patient will benefit from skilled therapeutic intervention in order to improve the following deficits and impairments:  Abnormal gait, Difficulty walking, Decreased safety awareness, Decreased balance, Decreased strength, Impaired sensation  Visit Diagnosis: Frequent falls  Muscle weakness (generalized)  Pain  in joints of left hand  Difficulty in walking, not elsewhere classified     Problem List Patient Active Problem List   Diagnosis Date Noted  . B12 deficiency 03/02/2020  . Congenital cavus deformity of both feet 03/02/2020  . Metatarsalgia of both feet 03/02/2020  . Hand arthritis 03/02/2020  . Arthritis of carpometacarpal (CMC) joint of both thumbs 09/19/2019  . Neuropathy 09/19/2019  . Overactive bladder 09/19/2019  . Irritant contact dermatitis due to drug in contact with skin 07/09/2019  . Sciatica 04/26/2019  . Paresthesia of both feet 02/04/2019  . Eye twitch 02/04/2019  . Right hip pain 08/01/2018  . Breast pain, left 05/17/2018  . Epidermal cyst 04/17/2018  . Allergic rhinitis 06/23/2015  . Eczema 06/23/2015  . Gastro-esophageal reflux disease without esophagitis 06/23/2015  . Hypercholesterolemia without hypertriglyceridemia 06/23/2015  . Essential hypertension 06/23/2015  . Hypothyroidism 06/23/2015  . Iron deficiency anemia 06/23/2015  . Other seasonal allergic rhinitis 06/23/2015  . Fracture of fibula, left, closed 11/13/2012  . Nondisplaced fracture of fifth right metatarsal bone 11/13/2012   Amador Cunas, PT, DPT [facility] Prose Laker Thompson 05/14/2020, 3:09 PM  [place]. [place], [place], [address] Phone: [phone]   Fax:  [phone]  Name: Shelly Pearson MRN: [id number] Date of Birth: June 11, 1941

## 2020-06-03 ENCOUNTER — Other Ambulatory Visit: Payer: Self-pay | Admitting: Family Medicine

## 2020-06-03 DIAGNOSIS — N3281 Overactive bladder: Secondary | ICD-10-CM

## 2020-06-10 DIAGNOSIS — Z1231 Encounter for screening mammogram for malignant neoplasm of breast: Secondary | ICD-10-CM | POA: Diagnosis not present

## 2020-06-10 LAB — HM MAMMOGRAPHY

## 2020-06-11 ENCOUNTER — Encounter: Payer: Self-pay | Admitting: Family Medicine

## 2020-06-18 ENCOUNTER — Ambulatory Visit (INDEPENDENT_AMBULATORY_CARE_PROVIDER_SITE_OTHER): Payer: Medicare Other | Admitting: Family

## 2020-06-18 ENCOUNTER — Telehealth: Payer: Self-pay

## 2020-06-18 ENCOUNTER — Encounter: Payer: Self-pay | Admitting: Family

## 2020-06-18 ENCOUNTER — Other Ambulatory Visit: Payer: Self-pay

## 2020-06-18 VITALS — BP 122/74 | HR 76 | Temp 97.9°F | Ht 64.0 in | Wt 134.0 lb

## 2020-06-18 DIAGNOSIS — K644 Residual hemorrhoidal skin tags: Secondary | ICD-10-CM | POA: Diagnosis not present

## 2020-06-18 DIAGNOSIS — B354 Tinea corporis: Secondary | ICD-10-CM

## 2020-06-18 DIAGNOSIS — Z1211 Encounter for screening for malignant neoplasm of colon: Secondary | ICD-10-CM

## 2020-06-18 MED ORDER — NYSTATIN 100000 UNIT/GM EX POWD: 1.0000 "application " | Freq: Three times a day (TID) | CUTANEOUS | 0 refills | Status: DC

## 2020-06-18 MED ORDER — FLUCONAZOLE 150 MG PO TABS: 150.0000 mg | ORAL_TABLET | Freq: Once | ORAL | 0 refills | Status: AC

## 2020-06-18 NOTE — Telephone Encounter (Signed)
Patient in the office today asking if she could switch to another provider (female) preferred? Okay for patient to become a patient of yours? Please advise.

## 2020-06-18 NOTE — Patient Instructions (Signed)

## 2020-06-18 NOTE — Progress Notes (Signed)
Acute Office Visit  Subjective:    Patient ID: Shelly Pearson, female    DOB: 01/22/1941, 79 y.o.   MRN: [id number]  Chief Complaint  Patient presents with  . Rectal Problems    C/O rectal itchiness, bleeding and rash x 1 week.     HPI Patient is in today with c/o swelling and itching to her rectal area x 1 week. She has been applying hydrocortisone cream and preparation h that she feels made her symptoms worse. She has noticed a red itchy trash around her rectum. Would like a referral for a colonoscopy screen.   Past Medical History:  Diagnosis Date  . GERD (gastroesophageal reflux disease)   . Hypertension   . Seasonal allergies   . Thyroid disease   . Urgency incontinence     Past Surgical History:  Procedure Laterality Date  . ABDOMINAL HYSTERECTOMY    . CESAREAN SECTION    . CYSTOCELE REPAIR    . ROTATOR CUFF REPAIR Right    2018    Family History  Problem Relation Age of Onset  . Stroke Mother   . Cancer Father   . Cancer Sister   . Cancer Brother     Social History   Socioeconomic History  . Marital status: Married    Spouse name: Not on file  . Number of children: Not on file  . Years of education: Not on file  . Highest education level: Not on file  Occupational History  . Occupation: Retired  Tobacco Use  . Smoking status: Never Smoker  . Smokeless tobacco: Never Used  Vaping Use  . Vaping Use: Never used  Substance and Sexual Activity  . Alcohol use: No  . Drug use: No  . Sexual activity: Not Currently  Other Topics Concern  . Not on file  Social History Narrative  . Not on file   Social Determinants of Health   Financial Resource Strain: Low Risk   . Difficulty of Paying Living Expenses: Not hard at all  Food Insecurity: No Food Insecurity  . Worried About Charity fundraiser in the Last Year: Never true  . Ran Out of Food in the Last Year: Never true  Transportation Needs: No Transportation Needs  . Lack of Transportation  (Medical): No  . Lack of Transportation (Non-Medical): No  Physical Activity: Sufficiently Active  . Days of Exercise per Week: 5 days  . Minutes of Exercise per Session: 40 min  Stress: No Stress Concern Present  . Feeling of Stress : Only a little  Social Connections: Socially Integrated  . Frequency of Communication with Friends and Family: More than three times a week  . Frequency of Social Gatherings with Friends and Family: Once a week  . Attends Religious Services: More than 4 times per year  . Active Member of Clubs or Organizations: Yes  . Attends Archivist Meetings: More than 4 times per year  . Marital Status: Married  Human resources officer Violence: Not At Risk  . Fear of Current or Ex-Partner: No  . Emotionally Abused: No  . Physically Abused: No  . Sexually Abused: No    Outpatient Medications Prior to Visit  Medication Sig Dispense Refill  . atorvastatin (LIPITOR) 20 MG tablet Take 1 tablet (20 mg total) by mouth daily. 90 tablet 3  . BIOTIN PO 1 capsule    . levothyroxine (SYNTHROID) 50 MCG tablet TAKE 1 TABLET DAILY BEFORE BREAKFAST 90 tablet 3  . losartan (COZAAR)  25 MG tablet Take 1 tablet (25 mg total) by mouth daily. 90 tablet 1  . trospium (SANCTURA) 20 MG tablet TAKE 1 TABLET AT BEDTIME 90 tablet 1  . famotidine (PEPCID) 40 MG tablet Take 1 tablet (40 mg total) by mouth daily. (Patient not taking: Reported on 06/18/2020) 90 tablet 4  . Cholecalciferol (VITAMIN D3) 2000 UNITS capsule     . Cyanocobalamin (VITAMIN B12) 1000 MCG TBCR     . diclofenac Sodium (VOLTAREN) 1 % GEL Apply a small grape sized dollop to sore places on hands and feet up to 4 times daily. 150 g 2   No facility-administered medications prior to visit.    Allergies  Allergen Reactions  . Adhesive  [Tape] Other (See Comments)    Review of Systems  Constitutional: Negative.   Respiratory: Negative.   Cardiovascular: Negative.   Gastrointestinal: Positive for anal bleeding and  vomiting. Negative for abdominal distention and abdominal pain.  Genitourinary: Negative.   Musculoskeletal: Negative.   Skin: Positive for rash.       Red rash to the rectum  Allergic/Immunologic: Negative.   Neurological: Negative.   Psychiatric/Behavioral: Negative.        Objective:    Physical Exam Exam conducted with a chaperone present.  Constitutional:      Appearance: Normal appearance. She is normal weight.  Cardiovascular:     Rate and Rhythm: Normal rate and regular rhythm.  Pulmonary:     Effort: Pulmonary effort is normal.     Breath sounds: Normal breath sounds.  Abdominal:     General: Abdomen is flat.     Palpations: Abdomen is soft.  Genitourinary:    Rectum: Guaiac result negative.    Musculoskeletal:     Cervical back: Normal range of motion.  Neurological:     Mental Status: She is alert.     BP 122/74   Pulse 76   Temp 97.9 F (36.6 C) (Temporal)   Ht 5\' 4"  (1.626 m)   Wt 134 lb (60.8 kg)   SpO2 97%   BMI 23.00 kg/m  Wt Readings from Last 3 Encounters:  06/18/20 134 lb (60.8 kg)  05/13/20 130 lb (59 kg)  03/05/20 134 lb (60.8 kg)    Health Maintenance Due  Topic Date Due  . Hepatitis C Screening  Never done    There are no preventive care reminders to display for this patient.   Lab Results  Component Value Date   TSH 1.15 03/02/2020   Lab Results  Component Value Date   WBC 5.1 03/02/2020   HGB 13.4 03/02/2020   HCT 40.3 03/02/2020   MCV 92.1 03/02/2020   PLT 205.0 03/02/2020   Lab Results  Component Value Date   NA 140 03/02/2020   K 4.0 03/02/2020   CO2 31 03/02/2020   GLUCOSE 84 03/02/2020   BUN 16 03/02/2020   CREATININE 0.75 03/02/2020   BILITOT 0.6 03/02/2020   ALKPHOS 67 03/02/2020   AST 17 03/02/2020   ALT 17 03/02/2020   PROT 6.4 03/02/2020   ALBUMIN 4.3 03/02/2020   CALCIUM 9.8 03/02/2020   GFR 74.97 03/02/2020   Lab Results  Component Value Date   CHOL 156 03/02/2020   Lab Results  Component  Value Date   HDL 69.40 03/02/2020   Lab Results  Component Value Date   LDLCALC 62 03/02/2020   Lab Results  Component Value Date   TRIG 126.0 03/02/2020   Lab Results  Component Value Date  CHOLHDL 2 03/02/2020   No results found for: HGBA1C     Assessment & Plan:   Problem List Items Addressed This Visit   None   Visit Diagnoses    External hemorrhoid    -  Primary   Relevant Orders   Ambulatory referral to Gastroenterology   Tinea corporis       Relevant Medications   nystatin (MYCOSTATIN/NYSTOP) powder   fluconazole (DIFLUCAN) 150 MG tablet   Screen for colon cancer       Relevant Orders   Ambulatory referral to Gastroenterology       Meds ordered this encounter  Medications  . nystatin (MYCOSTATIN/NYSTOP) powder    Sig: Apply 1 application topically 3 (three) times daily.    Dispense:  15 g    Refill:  0  . fluconazole (DIFLUCAN) 150 MG tablet    Sig: Take 1 tablet (150 mg total) by mouth once for 1 dose.    Dispense:  1 tablet    Refill:  0   Call the office with any additional questions or concerns  Kennyth Arnold, FNP

## 2020-06-18 NOTE — Telephone Encounter (Signed)
Ok with me 

## 2020-06-18 NOTE — Telephone Encounter (Signed)
Dr. Bryan Lemma have agreed to become patients PCP would you be able to change this in epic patients PC? She is also scheduled to see Dr. Ethelene Hal on 09/02/19 would like for this to be with [place] if possible if not she would like it to be with her new PCP.

## 2020-06-30 ENCOUNTER — Telehealth: Payer: Self-pay

## 2020-06-30 NOTE — Telephone Encounter (Signed)
Pt called and states she would like GI referral to be sent to [facility] because that is who she went to in the past.

## 2020-07-01 NOTE — Telephone Encounter (Signed)
Sent to [facility]

## 2020-07-15 DIAGNOSIS — K649 Unspecified hemorrhoids: Secondary | ICD-10-CM | POA: Diagnosis not present

## 2020-07-15 DIAGNOSIS — K625 Hemorrhage of anus and rectum: Secondary | ICD-10-CM | POA: Diagnosis not present

## 2020-07-24 DIAGNOSIS — M8588 Other specified disorders of bone density and structure, other site: Secondary | ICD-10-CM | POA: Diagnosis not present

## 2020-07-24 LAB — HM DEXA SCAN

## 2020-07-28 ENCOUNTER — Encounter: Payer: Self-pay | Admitting: Family Medicine

## 2020-07-29 ENCOUNTER — Encounter: Payer: Self-pay | Admitting: Family Medicine

## 2020-08-03 ENCOUNTER — Telehealth: Payer: Self-pay | Admitting: Family Medicine

## 2020-08-03 NOTE — Telephone Encounter (Signed)
Ok with me 

## 2020-08-03 NOTE — Telephone Encounter (Signed)
Pt is not satisfied with the time and availability of Dr. Ethelene Hal and requesting a female provider. Please let me know if either of you will accept as TOC (due in March).

## 2020-08-05 NOTE — Telephone Encounter (Signed)
Called pt and advised of below. She said that she will transfer to Dr. Gena Fray due to availability but thankful for the call.

## 2020-08-26 ENCOUNTER — Encounter: Payer: Self-pay | Admitting: Family Medicine

## 2020-08-26 DIAGNOSIS — K625 Hemorrhage of anus and rectum: Secondary | ICD-10-CM | POA: Insufficient documentation

## 2020-09-01 ENCOUNTER — Other Ambulatory Visit: Payer: Self-pay

## 2020-09-01 ENCOUNTER — Ambulatory Visit: Payer: PRIVATE HEALTH INSURANCE | Admitting: Family Medicine

## 2020-09-01 ENCOUNTER — Ambulatory Visit (INDEPENDENT_AMBULATORY_CARE_PROVIDER_SITE_OTHER): Payer: Medicare Other | Admitting: Family Medicine

## 2020-09-01 ENCOUNTER — Encounter: Payer: Self-pay | Admitting: Family Medicine

## 2020-09-01 VITALS — BP 136/74 | HR 67 | Temp 97.1°F | Ht 64.0 in | Wt 135.2 lb

## 2020-09-01 DIAGNOSIS — N3281 Overactive bladder: Secondary | ICD-10-CM | POA: Diagnosis not present

## 2020-09-01 DIAGNOSIS — K219 Gastro-esophageal reflux disease without esophagitis: Secondary | ICD-10-CM

## 2020-09-01 DIAGNOSIS — I10 Essential (primary) hypertension: Secondary | ICD-10-CM | POA: Diagnosis not present

## 2020-09-01 DIAGNOSIS — R202 Paresthesia of skin: Secondary | ICD-10-CM

## 2020-09-01 DIAGNOSIS — Z9181 History of falling: Secondary | ICD-10-CM | POA: Insufficient documentation

## 2020-09-01 DIAGNOSIS — E039 Hypothyroidism, unspecified: Secondary | ICD-10-CM

## 2020-09-01 DIAGNOSIS — E7800 Pure hypercholesterolemia, unspecified: Secondary | ICD-10-CM | POA: Diagnosis not present

## 2020-09-01 LAB — BASIC METABOLIC PANEL
BUN: 19 mg/dL (ref 6–23)
CO2: 30 mEq/L (ref 19–32)
Calcium: 9.6 mg/dL (ref 8.4–10.5)
Chloride: 106 mEq/L (ref 96–112)
Creatinine, Ser: 0.68 mg/dL (ref 0.40–1.20)
GFR: 84.08 mL/min (ref >60.00)
Glucose, Bld: 99 mg/dL (ref 70–99)
Potassium: 3.9 mEq/L (ref 3.5–5.1)
Sodium: 140 mEq/L (ref 135–145)

## 2020-09-01 LAB — LIPID PANEL
Cholesterol: 176 mg/dL (ref 0–200)
HDL: 71.8 mg/dL (ref >39.00)
LDL Cholesterol: 90 mg/dL (ref 0–99)
NonHDL: 104.53
Total CHOL/HDL Ratio: 2
Triglycerides: 73 mg/dL (ref 0.0–149.0)
VLDL: 14.6 mg/dL (ref 0.0–40.0)

## 2020-09-01 LAB — B12 AND FOLATE PANEL
Folate: >23.6 ng/mL (ref >5.9)
Vitamin B-12: 422 pg/mL (ref 211–911)

## 2020-09-01 LAB — TSH: TSH: 0.91 u[IU]/mL (ref 0.35–4.50)

## 2020-09-01 MED ORDER — ATORVASTATIN CALCIUM 20 MG PO TABS: 20.0000 mg | ORAL_TABLET | Freq: Every day | ORAL | 3 refills | Status: DC

## 2020-09-01 MED ORDER — LEVOTHYROXINE SODIUM 50 MCG PO TABS: 50.0000 ug | ORAL_TABLET | Freq: Every day | ORAL | 3 refills | Status: DC

## 2020-09-01 MED ORDER — LOSARTAN POTASSIUM 25 MG PO TABS: 25.0000 mg | ORAL_TABLET | Freq: Every day | ORAL | 1 refills | Status: DC

## 2020-09-01 MED ORDER — TROSPIUM CHLORIDE 20 MG PO TABS: 20.0000 mg | ORAL_TABLET | Freq: Every day | ORAL | 1 refills | Status: DC

## 2020-09-01 NOTE — Progress Notes (Signed)
[facility] [address] [place] [address] Dept: [phone] Dept Fax: [phone]  Transfer of Care Office Visit  Subjective:    Patient ID: Shelly Pearson, female    DOB: 12-11-1940, 80 y.o..   MRN: [id number]  Chief Complaint  Patient presents with  . Transitions Of Care    TOC from Dr. Ethelene Hal. C/O foot pain sometimes numbness, feels like there is a stone in right foot. Symptoms x 1 year or more.     History of Present Illness:  Patient is in today to establish care. She has previously been seeing Dr. Ethelene Hal. Ms. Noack has a history of hypertension, hyperlipidemia, and hypothyroidism. These are currently managed with losartan, atorvastatin, and levothyroxine. She is apparently stable on these. She also has had an issue with urinary incontinence. She had a previous bladder suspension surgery and is now on trospium and managing well.  Ms. Alderman notes her biggest issue is ongoing sensation of numbness in both feet, as well as some pain sensation around the right 1st and 2nd MTP joints. She had seen Dr. Raeford Razor for evaluation. The pain was identified as metatarsalgia and she was provided some metatarsal pads, which have given very minimal relief. She notes that much of this seemed to occur after she had experienced the first of multiple falls. The first, appeared to be an accidental misstep off of a step. Since that time, she has also had a tingling, numb and cold sensation of the feet and describes right upper leg cramping. Within the past several months, she also had brief episodes of spasm in her both hands on separate occasions, involving the lateral fingers. Dr. Raeford Razor had recommended a possible neurology consultation.  Ms. Vittitow has a history of GERD, She notes she has been on Pepcid for many years without any recurrent symptoms. She wonders if she still needs to be taking this medication.  Past Medical  History: Patient Active Problem List   Diagnosis Date Noted  . Personal history of fall 09/01/2020  . Rectal bleeding 08/26/2020  . B12 deficiency 03/02/2020  . Congenital cavus deformity of both feet 03/02/2020  . Metatarsalgia of both feet 03/02/2020  . Hand arthritis 03/02/2020  . Arthritis of carpometacarpal (CMC) joint of both thumbs 09/19/2019  . Neuropathy 09/19/2019  . Overactive bladder 09/19/2019  . Irritant contact dermatitis due to drug in contact with skin 07/09/2019  . Sciatica 04/26/2019  . Paresthesia of both feet 02/04/2019  . Eye twitch 02/04/2019  . Right hip pain 08/01/2018  . Breast pain, left 05/17/2018  . Epidermal cyst 04/17/2018  . Allergic rhinitis 06/23/2015  . Eczema 06/23/2015  . Gastro-esophageal reflux disease without esophagitis 06/23/2015  . Hypercholesterolemia without hypertriglyceridemia 06/23/2015  . Essential hypertension 06/23/2015  . Hypothyroidism 06/23/2015  . Iron deficiency anemia 06/23/2015  . Fracture of fibula, left, closed 11/13/2012  . Nondisplaced fracture of fifth right metatarsal bone 11/13/2012   Past Surgical History:  Procedure Laterality Date  . ABDOMINAL HYSTERECTOMY    . CESAREAN SECTION    . CYSTOCELE REPAIR    . ROTATOR CUFF REPAIR Right    2018   Family History  Problem Relation Age of Onset  . Stroke Mother   . Cancer Father   . Cancer Sister   . Cancer Brother    Outpatient Medications Prior to Visit  Medication Sig Dispense Refill  . BIOTIN PO 1 capsule    . famotidine (PEPCID) 40 MG tablet Take 1 tablet (  40 mg total) by mouth daily. 90 tablet 4  . atorvastatin (LIPITOR) 20 MG tablet Take 1 tablet (20 mg total) by mouth daily. 90 tablet 3  . levothyroxine (SYNTHROID) 50 MCG tablet TAKE 1 TABLET DAILY BEFORE BREAKFAST 90 tablet 3  . losartan (COZAAR) 25 MG tablet Take 1 tablet (25 mg total) by mouth daily. 90 tablet 1  . trospium (SANCTURA) 20 MG tablet TAKE 1 TABLET AT BEDTIME 90 tablet 1  . nystatin  (MYCOSTATIN/NYSTOP) powder Apply 1 application topically 3 (three) times daily. 15 g 0   No facility-administered medications prior to visit.   Allergies  Allergen Reactions  . Adhesive  [Tape] Other (See Comments)    Objective:   Today's Vitals   09/01/20 1032  BP: 136/74  Pulse: 67  Temp: (!) 97.1 F (36.2 C)  TempSrc: Temporal  SpO2: 96%  Weight: 135 lb 3.2 oz (61.3 kg)  Height: 5\' 4"  (1.626 m)   Body mass index is 23.21 kg/m.   General: Well developed, well nourished. No acute distress. Extremities: Full ROM of both ankles and feet. No joint swelling or tenderness. Trace edema of the lower legs.   Strength in the feet is normal and equal bilaterally. She has preserved sensation with 5.07 monofilament testing   to the soles of the feet, except to the left 3rd toe and the mid part of the metatarsal pad. Psych: Alert and oriented. Normal mood and affect.  Health Maintenance Due  Topic Date Due  . Hepatitis C Screening  Never done     Assessment & Plan:   1. Personal history of fall 2. Paresthesia of both feet  These appear to possibly be linked. Ms. Taflinger has a more distant history of a B12 deficiency, thought he last time this was tested, her value was high and her B12 supplementation was stopped. I will reassess both folate and B12 today. We will also check the status of her thyroid replacement and a  Blood sugar to screen for other possible causes. I will refer her to neurology for assessment of the paresthesia .  - B12 and Folate Panel - Ambulatory referral to Neurology  3. Hypercholesterolemia without hypertriglyceridemia  - Lipid panel - atorvastatin (LIPITOR) 20 MG tablet; Take 1 tablet (20 mg total) by mouth daily.  Dispense: 90 tablet; Refill: 3  4. Overactive bladder Stable.  - trospium (SANCTURA) 20 MG tablet; Take 1 tablet (20 mg total) by mouth at bedtime.  Dispense: 90 tablet; Refill: 1  5. Acquired hypothyroidism  - TSH - levothyroxine  (SYNTHROID) 50 MCG tablet; Take 1 tablet (50 mcg total) by mouth daily before breakfast.  Dispense: 90 tablet; Refill: 3  6. Gastro-esophageal reflux disease without esophagitis We discussed her finishing out her current prescription of Pepcid by taking 1/2 tablet daily until gone. I will then have her stop her medication and give her a trial off of meds. If her symptoms recur and persist for more than 2 weeks, we would consider restarting the Pepcid.  7. Essential hypertension At goal.  - Basic metabolic panel - losartan (COZAAR) 25 MG tablet; Take 1 tablet (25 mg total) by mouth daily.  Dispense: 90 tablet; Refill: 1  Haydee Salter, MD

## 2020-09-17 ENCOUNTER — Encounter: Payer: Self-pay | Admitting: Neurology

## 2020-09-17 ENCOUNTER — Other Ambulatory Visit: Payer: Self-pay

## 2020-09-17 ENCOUNTER — Telehealth: Payer: Self-pay | Admitting: Neurology

## 2020-09-17 ENCOUNTER — Ambulatory Visit (INDEPENDENT_AMBULATORY_CARE_PROVIDER_SITE_OTHER): Payer: Medicare Other | Admitting: Neurology

## 2020-09-17 VITALS — BP 130/71 | HR 72 | Ht 64.0 in | Wt 134.8 lb

## 2020-09-17 DIAGNOSIS — R269 Unspecified abnormalities of gait and mobility: Secondary | ICD-10-CM | POA: Diagnosis not present

## 2020-09-17 DIAGNOSIS — R202 Paresthesia of skin: Secondary | ICD-10-CM

## 2020-09-17 NOTE — Progress Notes (Signed)
Reason for visit: Gait disorder, paresthesias  Referring physician: Dr. [name] is a 80 y.o. female  History of present illness:  Shelly Pearson is a 80 year old right-handed white female with a history of a change in sensation and gait stability that has occurred over the last year or so.  The onset has been insidious in nature, the most symptomatic area is her right foot.  The patient has noted some numbness and tingling sensations in the foot and a soreness in the ball the foot as if she has been walking on a stone.  The patient will have increased discomfort when she is bearing weight.  She claims that she has always had a problem with walking on the side of her right foot.  At times, she may have cramps in the right thigh that may occur at nighttime.  She indicates that she has had falls that have been increasing in frequency over time, she has fallen twice in the last 4 weeks, she fell yesterday.  The patient does have some numbness and tingling in the left foot as well and in both hands.  She believes that the sensory alterations began in feet first but the hands are involved as well.  She denies any low back pain per se but she does have some neck discomfort off and on.  The patient does report some problems with constipation.  She has a 20-year history of urge incontinence of the bladder, this has not changed.  She feels that the right leg is slightly weak, and she has been dropping things from her hands off and on.  The patient does report some occasional bifrontal headaches but this is not a significant problem for her.  She reports no vision changes or problems with speech or swallowing.  The patient has to wear tennis shoes as wearing other types of shoes will hurt her feet, particularly on the right.  She has had nerve conduction studies of both arms that were unremarkable.  EMG of the left upper extremity was unremarkable.  She is sent to this office for further  evaluation.  Past Medical History:  Diagnosis Date  . GERD (gastroesophageal reflux disease)   . Hypertension   . Seasonal allergies   . Thyroid disease   . Urgency incontinence     Past Surgical History:  Procedure Laterality Date  . ABDOMINAL HYSTERECTOMY    . CESAREAN SECTION    . CYSTOCELE REPAIR    . ROTATOR CUFF REPAIR Right    2018    Family History  Problem Relation Age of Onset  . Stroke Mother   . Cancer Father   . Cancer Sister   . Cancer Brother     Social history:  reports that she has never smoked. She has never used smokeless tobacco. She reports current alcohol use. She reports that she does not use drugs.  Medications:  Prior to Admission medications   Medication Sig Start Date End Date Taking? Authorizing Provider  atorvastatin (LIPITOR) 20 MG tablet Take 1 tablet (20 mg total) by mouth daily. 09/01/20   Haydee Salter, MD  BIOTIN PO 1 capsule    [provider]  famotidine (PEPCID) 40 MG tablet Take 1 tablet (40 mg total) by mouth daily. 09/19/19   Libby Maw, MD  levothyroxine (SYNTHROID) 50 MCG tablet Take 1 tablet (50 mcg total) by mouth daily before breakfast. 09/01/20   Haydee Salter, MD  losartan (COZAAR) 25 MG  tablet Take 1 tablet (25 mg total) by mouth daily. 09/01/20   Haydee Salter, MD  trospium (SANCTURA) 20 MG tablet Take 1 tablet (20 mg total) by mouth at bedtime. 09/01/20   Haydee Salter, MD      Allergies  Allergen Reactions  . Adhesive  [Tape] Other (See Comments)    ROS:  Out of a complete 14 system review of symptoms, the patient complains only of the following symptoms, and all other reviewed systems are negative.  Walking difficulty, falls Weakness Numbness, tingling Right foot pain  Blood pressure 130/71, pulse 72, height 5\' 4"  (1.626 m), weight 134 lb 12.8 oz (61.1 kg).  Physical Exam  General: The patient is alert and cooperative at the time of the examination.  Eyes: Pupils are equal, round,  and reactive to light. Discs are flat bilaterally.  Neck: The neck is supple, no carotid bruits are noted.  Respiratory: The respiratory examination is clear.  Cardiovascular: The cardiovascular examination reveals a regular rate and rhythm, no obvious murmurs or rubs are noted.  Neuromuscular: Range move the lumbar spine and cervical spine are relatively full.  Skin: Extremities are without significant edema.  Neurologic Exam  Mental status: The patient is alert and oriented x 3 at the time of the examination. The patient has apparent normal recent and remote memory, with an apparently normal attention span and concentration ability.  Cranial nerves: Facial symmetry is present. There is good sensation of the face to pinprick and soft touch bilaterally. The strength of the facial muscles and the muscles to head turning and shoulder shrug are normal bilaterally. Speech is well enunciated, no aphasia or dysarthria is noted. Extraocular movements are full. Visual fields are full. The tongue is midline, and the patient has symmetric elevation of the soft palate. No obvious hearing deficits are noted.  Motor: The motor testing reveals 5 over 5 strength of all 4 extremities, with exception of some slight weakness with hip flexion on the right, not present on the left. Good symmetric motor tone is noted throughout.  Sensory: Sensory testing is intact to pinprick, soft touch, vibration sensation, and position sense on all 4 extremities, with exception of some decreased pinprick sensation on the right lower extremity.  No evidence of a stocking pattern pinprick sensory deficit was noted in either leg.  No evidence of extinction is noted.  Coordination: Cerebellar testing reveals good finger-nose-finger and heel-to-shin bilaterally.  Gait and station: Gait is associated with a limping quality on the right leg.  The patient can walk independently.  Tandem gait is minimally unsteady.  Romberg is  negative, no drift is seen.  Reflexes: Deep tendon reflexes are symmetric and normal bilaterally.  The patient has well-maintained ankle jerks bilaterally.  Toes are downgoing bilaterally.   MRI cervical 09/13/14:  IMPRESSION: 1. At C3-4 there is a small central disc protrusion. Moderate left facet arthropathy and uncovertebral degenerative change resulting in severe left foraminal stenosis. 2. At C4-5 there is a broad-based disc osteophyte complex with bilateral uncovertebral degenerative change and mild bilateral facet arthropathy resulting in mild bilateral foraminal stenosis. 3. At C5-6 there is a mild broad-based disc osteophyte complex. Bilateral uncovertebral degenerative changes resulting in moderate right foraminal stenosis. 4. At C6-7 there is a mild broad-based disc bulge. Moderate right foraminal stenosis.  * MRI scan images were reviewed online. I agree with the written report.    Assessment/Plan:  1.  Paresthesias, all 4 extremities, right greater than left  2.  Gait disorder, episodic falls  Clinical examination today does show some slight weakness of the right leg, the patient is reporting some sensory alteration on all 4 extremities, worse on the right foot.  She does claim that when she turns her head to the right she may have an altered sensation in her right buttocks.  The patient will be sent for MRI of the cervical spine and lumbar spine to exclude the possibility of spinal stenosis.  If the above studies are unremarkable, we will pursue EMG and nerve conduction study evaluation and further blood work analysis.  She has had a recent B12 level that was unremarkable.  She will follow-up in 3 months otherwise.  She has had physical therapy previously, she claims that this was not helpful in correcting her gait problem.  Shelly Alexanders MD 09/17/2020 7:52 AM  [facility] [address] [place], [place] [address]  Phone  [phone] Fax [phone]

## 2020-09-17 NOTE — Telephone Encounter (Signed)
Medicare/[employer] life order sent to GI. No auth they will reach out to the patient to schedule.

## 2020-09-21 ENCOUNTER — Ambulatory Visit
Admission: RE | Admit: 2020-09-21 | Discharge: 2020-09-21 | Disposition: A | Discharge status: Home / Self Care | Payer: Medicare Other | Source: Ambulatory Visit | Attending: Neurology | Admitting: Neurology

## 2020-09-21 ENCOUNTER — Other Ambulatory Visit: Payer: Self-pay

## 2020-09-21 DIAGNOSIS — R269 Unspecified abnormalities of gait and mobility: Secondary | ICD-10-CM

## 2020-09-21 DIAGNOSIS — R202 Paresthesia of skin: Secondary | ICD-10-CM

## 2020-09-22 ENCOUNTER — Telehealth: Payer: Self-pay | Admitting: Neurology

## 2020-09-22 DIAGNOSIS — R202 Paresthesia of skin: Secondary | ICD-10-CM

## 2020-09-22 NOTE — Telephone Encounter (Signed)
LVM on number listed as home for pt to call back, vm was full on mobile phone [facility] 3/22

## 2020-09-22 NOTE — Telephone Encounter (Signed)
Pt returned phone call. Have scheduled Nerve Conduction 3/29 at 3:30p

## 2020-09-22 NOTE — Telephone Encounter (Signed)
I called the patient.  MRI of the cervical spine and lumbar spine do not explain gait instability or sensory changes in the right leg.  No evidence of spinal stenosis or spinal cord compression.  We will check nerve conduction studies and look for any source of gait instability.  We will do nerve conduction studies on both legs, EMG on the right leg.    MRI cervical 09/21/20:  IMPRESSION: This MRI of the cervical spine without contrast shows the following: 1.   The spinal cord appears normal. 2.   At C4-C5 and C5-C6 there is mild spinal stenosis due to degenerative changes.  There does not appear to be nerve root compression at these levels. 3.   Milder degenerative changes at other cervical levels that do not lead to spinal stenosis or nerve root compression.   MRI lumbar 09/21/20:  IMPRESSION: This MRI of the lumbar spine without contrast shows: 1.   At L4-L5, there is disc protrusion and other degenerative changes causing moderately severe left lateral recess stenosis that could lead to left L5 nerve root compression. 2.   Moderate degenerative changes at other lumbar levels that do not lead to spinal stenosis or nerve root compression.  Addendum: Not mentioned in the body of the report, there are type II Modic degenerative endplate changes at H8-I6 and mild edema in the endplates surrounding the L5-S1 disc.

## 2020-09-29 ENCOUNTER — Encounter: Payer: Self-pay | Admitting: Neurology

## 2020-09-29 ENCOUNTER — Ambulatory Visit (INDEPENDENT_AMBULATORY_CARE_PROVIDER_SITE_OTHER): Payer: Medicare Other | Admitting: Neurology

## 2020-09-29 DIAGNOSIS — R202 Paresthesia of skin: Secondary | ICD-10-CM | POA: Diagnosis not present

## 2020-09-29 DIAGNOSIS — R269 Unspecified abnormalities of gait and mobility: Secondary | ICD-10-CM

## 2020-09-29 NOTE — Procedures (Signed)
     HISTORY:  Shelly Pearson is a 80 year old patient with a gradual increasing issue with gait instability.  The patient reports some sensory alteration on all 4 extremities, some discomfort involving the right foot.  She is being evaluated for possible neuropathy that would explain her gait instability and falls.  NERVE CONDUCTION STUDIES:  Nerve conduction studies were performed on both lower extremities. The distal motor latencies and motor amplitudes for the peroneal and posterior tibial nerves were within normal limits. The nerve conduction velocities for these nerves were also normal. The sensory latencies for the peroneal and sural nerves were within normal limits. The F wave latencies for the posterior tibial nerves were within normal limits.   EMG STUDIES:  EMG study was performed on the right lower extremity:  The tibialis anterior muscle reveals 2 to 4K motor units with full recruitment. No fibrillations or positive waves were seen. The peroneus tertius muscle reveals 2 to 4K motor units with full recruitment. No fibrillations or positive waves were seen. The medial gastrocnemius muscle reveals 1 to 3K motor units with full recruitment. No fibrillations or positive waves were seen. The vastus lateralis muscle reveals 2 to 4K motor units with full recruitment. No fibrillations or positive waves were seen. The iliopsoas muscle reveals 2 to 4K motor units with full recruitment. No fibrillations or positive waves were seen. The biceps femoris muscle (long head) reveals 2 to 4K motor units with full recruitment. No fibrillations or positive waves were seen. The lumbosacral paraspinal muscles were tested at 3 levels, and revealed no abnormalities of insertional activity at all 3 levels tested. There was good relaxation.   IMPRESSION:  Nerve conduction studies done on both lower extremities were within normal limits, no evidence of neuropathy was seen.  EMG evaluation of the right  lower extremity is unremarkable.  No source of gait instability is delineated on this study.  Jill Alexanders MD 09/29/2020 4:15 PM  [facility] [address] [place], [place] [address]  Phone [phone] Fax [phone]

## 2020-09-29 NOTE — Progress Notes (Addendum)
Patient comes in for EMG and nerve conduction study evaluation.is essentially unremarkable, nerve conductions are normal, EMG of the right leg is unremarkable.  The source of the sensory alterations in gait disturbance has not yet been delineated, MRI of the cervical spine and lumbar spine did not show an etiology for these issues.  We will check blood work today, consider MRI of the brain, the patient does report some difficulty with memory that has developed over the last year or 2.     [place]    Nerve / Sites Muscle Latency Ref. Amplitude Ref. Rel Amp Segments Distance Velocity Ref. Area    ms ms mV mV %  cm m/s m/s mVms  R Peroneal - EDB     Ankle EDB 3.7 ?6.5 4.3 ?2.0 100 Ankle - EDB 9   14.1     Fib head EDB 9.0  3.7  85.8 Fib head - Ankle 26 50 ?44 12.9     Pop fossa EDB 11.0  3.6  97.1 Pop fossa - Fib head 10 49 ?44 12.6         Pop fossa - Ankle      L Peroneal - EDB     Ankle EDB 3.6 ?6.5 4.4 ?2.0 100 Ankle - EDB 9   17.7     Fib head EDB 9.2  4.0  91.4 Fib head - Ankle 26 47 ?44 13.8     Pop fossa EDB 11.4  3.8  95 Pop fossa - Fib head 10 46 ?44 13.8         Pop fossa - Ankle      R Tibial - AH     Ankle AH 4.1 ?5.8 8.5 ?4.0 100 Ankle - AH 9   18.7     Pop fossa AH 11.9  6.3  74.6 Pop fossa - Ankle 33 42 ?41 18.6  L Tibial - AH     Ankle AH 3.9 ?5.8 4.9 ?4.0 100 Ankle - AH 9   10.0     Pop fossa AH 12.0  3.3  68.5 Pop fossa - Ankle 35 43 ?41 13.2             [facility]    Nerve / Sites Rec. Site Peak Lat Ref.  Amp Ref. Segments Distance    ms ms V V  cm  R Sural - Ankle (Calf)     Calf Ankle 3.2 ?4.4 7 ?6 Calf - Ankle 14  L Sural - Ankle (Calf)     Calf Ankle 3.2 ?4.4 8 ?6 Calf - Ankle 14  R Superficial peroneal - Ankle     Lat leg Ankle 2.9 ?4.4 6 ?6 Lat leg - Ankle 14  L Superficial peroneal - Ankle     Lat leg Ankle 3.0 ?4.4 6 ?6 Lat leg - Ankle 14             F  Wave    Nerve F Lat Ref.   ms ms  R Tibial - AH 51.1 ?56.0  L Tibial - AH 53.2 ?56.0

## 2020-09-29 NOTE — Progress Notes (Signed)
Please refer to EMG and nerve conduction procedure note.  

## 2020-10-02 ENCOUNTER — Other Ambulatory Visit: Payer: Medicare Other

## 2020-10-07 LAB — HTLV I+II ANTIBODIES, (EIA), BLD: HTLV I/II Ab: NEGATIVE

## 2020-10-07 LAB — SEDIMENTATION RATE: Sed Rate: 2 mm/hr (ref 0–40)

## 2020-10-07 LAB — VITAMIN E
Vitamin E (Alpha Tocopherol): 19.8 mg/L (ref 9.0–29.0)
Vitamin E(Gamma Tocopherol): 1.5 mg/L (ref 0.5–4.9)

## 2020-10-07 LAB — GLIADIN ANTIBODIES, SERUM
Antigliadin Abs, IgA: 4 units (ref 0–19)
Gliadin IgG: 2 units (ref 0–19)

## 2020-10-07 LAB — COPPER, SERUM: Copper: 83 ug/dL (ref 80–158)

## 2020-10-07 LAB — RPR: RPR Ser Ql: NONREACTIVE

## 2020-10-07 LAB — B. BURGDORFI ANTIBODIES: Lyme IgG/IgM Ab: <0.91 {ISR} (ref 0.00–0.90)

## 2020-10-07 LAB — ANA W/REFLEX: Anti Nuclear Antibody (ANA): NEGATIVE

## 2020-11-03 ENCOUNTER — Ambulatory Visit: Payer: Medicare Other | Attending: Internal Medicine

## 2020-11-03 ENCOUNTER — Other Ambulatory Visit (HOSPITAL_BASED_OUTPATIENT_CLINIC_OR_DEPARTMENT_OTHER): Payer: Self-pay

## 2020-11-03 ENCOUNTER — Other Ambulatory Visit: Payer: Self-pay

## 2020-11-03 DIAGNOSIS — Z23 Encounter for immunization: Secondary | ICD-10-CM

## 2020-11-03 MED ORDER — PFIZER-BIONT COVID-19 VAC-TRIS 30 MCG/0.3ML IM SUSP
INTRAMUSCULAR | 0 refills | Status: DC
  Filled 2020-11-03: qty 0.3, 1d supply, fill #0

## 2020-11-03 NOTE — Progress Notes (Signed)
   Covid-19 Vaccination Clinic  Name:  LATOI GIRALDO    MRN: [id number] DOB: Sep 12, 1940  11/03/2020  Ms. Amaro was observed post Covid-19 immunization for 15 minutes without incident. She was provided with Vaccine Information Sheet and instruction to access the V-Safe system.   Ms. Moncivais was instructed to call 911 with any severe reactions post vaccine: Marland Kitchen Difficulty breathing  . Swelling of face and throat  . A fast heartbeat  . A bad rash all over body  . Dizziness and weakness   Immunizations Administered    Name Date Dose VIS Date Route   PFIZER Comrnaty(Gray TOP) Covid-19 Vaccine 11/03/2020  1:29 PM 0.3 mL 06/11/2020 Intramuscular   Manufacturer: [place]   Lot: [id number]   NDC: [phone]

## 2020-11-16 DIAGNOSIS — L820 Inflamed seborrheic keratosis: Secondary | ICD-10-CM | POA: Diagnosis not present

## 2020-11-16 DIAGNOSIS — L821 Other seborrheic keratosis: Secondary | ICD-10-CM | POA: Diagnosis not present

## 2020-11-16 DIAGNOSIS — L2089 Other atopic dermatitis: Secondary | ICD-10-CM | POA: Diagnosis not present

## 2020-11-16 DIAGNOSIS — L738 Other specified follicular disorders: Secondary | ICD-10-CM | POA: Diagnosis not present

## 2020-11-25 ENCOUNTER — Telehealth: Payer: Self-pay | Admitting: Family Medicine

## 2020-11-25 NOTE — Telephone Encounter (Signed)
Called [facility] and spoke to Clarise Cruz, was advised that they usually dispense the Synthroid to patients and when she last got he 41 day RX it was a $3 co-pay.  Was advised that patient would have to call and ask for a refill.   Called patient back and advised her of information from [facility].  She will call and ask for a refill. No further questions. Dm/cma

## 2020-11-25 NOTE — Telephone Encounter (Signed)
Pt called and said that her pharmacy has been filling brand name Synthroid and she said its expensive and wants to get the generic but said they told her to contact her PCP and send it in for generic. She needs it [facility] into [facility] fax [phone]. Phone number: [phone]

## 2020-12-10 ENCOUNTER — Ambulatory Visit (INDEPENDENT_AMBULATORY_CARE_PROVIDER_SITE_OTHER): Payer: Medicare Other | Admitting: Family Medicine

## 2020-12-10 ENCOUNTER — Encounter: Payer: Self-pay | Admitting: Family Medicine

## 2020-12-10 ENCOUNTER — Other Ambulatory Visit: Payer: Self-pay

## 2020-12-10 VITALS — BP 110/70 | HR 70 | Temp 97.5°F | Ht 64.0 in | Wt 131.8 lb

## 2020-12-10 DIAGNOSIS — I10 Essential (primary) hypertension: Secondary | ICD-10-CM

## 2020-12-10 DIAGNOSIS — E049 Nontoxic goiter, unspecified: Secondary | ICD-10-CM | POA: Insufficient documentation

## 2020-12-10 DIAGNOSIS — H1189 Other specified disorders of conjunctiva: Secondary | ICD-10-CM

## 2020-12-10 DIAGNOSIS — M4802 Spinal stenosis, cervical region: Secondary | ICD-10-CM | POA: Insufficient documentation

## 2020-12-10 DIAGNOSIS — R202 Paresthesia of skin: Secondary | ICD-10-CM | POA: Diagnosis not present

## 2020-12-10 DIAGNOSIS — E039 Hypothyroidism, unspecified: Secondary | ICD-10-CM | POA: Diagnosis not present

## 2020-12-10 DIAGNOSIS — E782 Mixed hyperlipidemia: Secondary | ICD-10-CM | POA: Insufficient documentation

## 2020-12-10 DIAGNOSIS — M65321 Trigger finger, right index finger: Secondary | ICD-10-CM | POA: Diagnosis not present

## 2020-12-10 DIAGNOSIS — M5136 Other intervertebral disc degeneration, lumbar region: Secondary | ICD-10-CM | POA: Insufficient documentation

## 2020-12-10 DIAGNOSIS — M545 Low back pain, unspecified: Secondary | ICD-10-CM | POA: Insufficient documentation

## 2020-12-10 DIAGNOSIS — N3281 Overactive bladder: Secondary | ICD-10-CM

## 2020-12-10 DIAGNOSIS — N3941 Urge incontinence: Secondary | ICD-10-CM

## 2020-12-10 HISTORY — DX: Urge incontinence: N39.41

## 2020-12-10 NOTE — Progress Notes (Signed)
[facility] [address] [place] [address] Dept: [phone] Dept Fax: [phone]  Chronic Care Office Visit  Subjective:    Patient ID: NEVA RAMASWAMY, female    DOB: September 13, 1940, 80 y.o..   MRN: [id number]  Chief Complaint  Patient presents with   Follow-up    3 month f/u HTN.  C/o having was in ears and pain in eyes when she uses her eye drops in last 3-4 days.      History of Present Illness:  Patient is in today for reassessment of chronic medical issues.  Ms. Cannell has a history of hypertension, hyperlipidemia, and hypothyroidism. These are currently managed with losartan, atorvastatin, and levothyroxine. She has been being seen by neurology related to her complaint of numbness in her hands and feet with secondary falls. She has had extensive laboratory evaluation, a CT scan of the cervical and lumbar spine, and nerve conduction studies. So far, her results have not determined an etiology for what she is noticing.  Ms. Friend has a history of an overactive bladder with urge incontinence. She continues on trospium and feels this is adequately managed.  Ms. Castleman notes that when she showers in the morning, she has a sensation of muffled hearing, so wants her ears checked for wax.  Ms. Hansmann has noted a soreness in the right eye when she uses her Systane eye drops over the past few days. She admits that she has had some allergic issues at times.  Ms. Onofre notes an occasional issue with her right index finger that locks in a bent position after gripping her hair dryer. This happened once some months ago and recurred a few days ago. She notes she can straighten in manually. She has not had soreness in the hand.  Past Medical History: Patient Active Problem List   Diagnosis Date Noted   Low back pain 12/10/2020   Thyroid goiter 12/10/2020   Urge incontinence of urine 12/10/2020   Mixed hyperlipidemia  12/10/2020   Personal history of fall 09/01/2020   Rectal bleeding 08/26/2020   B12 deficiency 03/02/2020   Congenital cavus deformity of both feet 03/02/2020   Metatarsalgia of both feet 03/02/2020   Hand arthritis 03/02/2020   Arthritis of carpometacarpal (CMC) joint of both thumbs 09/19/2019   Overactive bladder 09/19/2019   Irritant contact dermatitis due to drug in contact with skin 07/09/2019   Sciatica 04/26/2019   Paresthesia of both feet 02/04/2019   Eye twitch 02/04/2019   Right hip pain 08/01/2018   Breast pain, left 05/17/2018   Epidermal cyst 04/17/2018   Allergic rhinitis 06/23/2015   Eczema 06/23/2015   Gastro-esophageal reflux disease without esophagitis 06/23/2015   Essential hypertension 06/23/2015   Hypothyroidism 06/23/2015   Iron deficiency anemia 06/23/2015   Fracture of fibula, left, closed 11/13/2012   Nondisplaced fracture of fifth right metatarsal bone 11/13/2012   Past Surgical History:  Procedure Laterality Date   ABDOMINAL HYSTERECTOMY     CESAREAN SECTION     CYSTOCELE REPAIR     ROTATOR CUFF REPAIR Right    2018   Family History  Problem Relation Age of Onset   Stroke Mother    Cancer Father    Cancer Sister    Cancer Brother    Outpatient Medications Prior to Visit  Medication Sig Dispense Refill   atorvastatin (LIPITOR) 20 MG tablet Take 1 tablet (20 mg total) by mouth daily. 90 tablet 3   BIOTIN PO 1 capsule  levothyroxine (SYNTHROID) 50 MCG tablet Take 1 tablet (50 mcg total) by mouth daily before breakfast. 90 tablet 3   losartan (COZAAR) 25 MG tablet Take 1 tablet (25 mg total) by mouth daily. 90 tablet 1   Multiple Vitamins-Minerals (MATURE ADULT CENTURY PO)      Polyethyl Glycol-Propyl Glycol (SYSTANE) 0.4-0.3 % GEL ophthalmic gel 1 drop into affected eye as needed     trospium (SANCTURA) 20 MG tablet Take 1 tablet (20 mg total) by mouth at bedtime. 90 tablet 1   Multiple Vitamin (MULTIVITAMIN) tablet Take 1 tablet by mouth  daily.     COVID-19 mRNA Vac-TriS, Pfizer, (PFIZER-BIONT COVID-19 VAC-TRIS) SUSP injection Inject into the muscle. (Patient not taking: Reported on 12/10/2020) 0.3 mL 0   famotidine (PEPCID) 40 MG tablet Take 1 tablet (40 mg total) by mouth daily. 90 tablet 4   No facility-administered medications prior to visit.   Allergies  Allergen Reactions   Adhesive  [Tape] Other (See Comments)    Objective:   Today's Vitals   12/10/20 0858  BP: 110/70  Pulse: 70  Temp: (!) 97.5 F (36.4 C)  TempSrc: Temporal  SpO2: 99%  Weight: 131 lb 12.8 oz (59.8 kg)  Height: 5\' 4"  (1.626 m)   Body mass index is 22.62 kg/m.   General: Well developed, well nourished. No acute distress. HEENT: Normocephalic, non-traumatic. External ears normal. EAC and TMs normal bilaterally with a small amount of wax in the   right EAC. There is a small, pale yellow papular area lateral to the iris on the right cornea. No significant vascular changes noted. Extremities: Full ROM. No joint swelling or tenderness. No edema noted. No triggering noted today in the right index finger and   no palpable tendon nodules. Psych: Alert and oriented. Normal mood and affect.  Health Maintenance Due  Topic Date Due   Pneumococcal Vaccine 96-95 Years old (1 - PCV) Never done   Hepatitis C Screening  Never done   Zoster Vaccines- Shingrix (1 of 2) Never done   Lab Results Lab Results  Component Value Date   CHOL 176 09/01/2020   HDL 71.80 09/01/2020   LDLCALC 90 09/01/2020   LDLDIRECT 70.0 03/02/2020   TRIG 73.0 09/01/2020   CHOLHDL 2 09/01/2020    Lab Results  Component Value Date   TSH 0.91 09/01/2020     Imaging: MRI of cervical spine: (09/21/2020) IMPRESSION:  1.   The spinal cord appears normal. 2.   At C4-C5 and C5-C6 there is mild spinal stenosis due to degenerative changes.  There does not appear to be nerve root compression at these levels. 3.   Milder degenerative changes at other cervical levels that do not  lead to spinal stenosis or nerve root compression.  MRI of lumbar spine (09/21/2020) IMPRESSION:  1.   At L4-L5, there is disc protrusion and other degenerative changes causing moderately severe left lateral recess stenosis that could lead to left L5 nerve root compression. 2.   Moderate degenerative changes at other lumbar levels that do not lead to spinal stenosis or nerve root compression.    Assessment & Plan:   1. Essential hypertension Blood pressure is at goal. We will continue losartan.  2. Paresthesia of both feet Evaluation by neurology has not determined an etiology at this point. She will follow-up with neurology regarding next steps.  3. Acquired hypothyroidism Last TSH was at goal. Will reassess in 3 months.  4. Mixed hyperlipidemia Last lipids at goal on  atorvastatin. Will reassess in 6 months.  5. Overactive bladder Stable on trospium.  6. Trigger index finger of right hand Appears mild at this point. Discussed use of ice and NSAIDs as needed and follow-up if occurring more frequently.  7. Conjunctival irritation Appears to be an irritated area, likely due to allergies. Recommend assessment by eye doctor if persistent or worsens.  Haydee Salter, MD

## 2020-12-24 ENCOUNTER — Ambulatory Visit (INDEPENDENT_AMBULATORY_CARE_PROVIDER_SITE_OTHER): Payer: Medicare Other | Admitting: Neurology

## 2020-12-24 ENCOUNTER — Other Ambulatory Visit: Payer: Self-pay

## 2020-12-24 ENCOUNTER — Encounter: Payer: Self-pay | Admitting: Neurology

## 2020-12-24 VITALS — BP 117/68 | HR 76 | Ht 64.0 in | Wt 133.0 lb

## 2020-12-24 DIAGNOSIS — Z9181 History of falling: Secondary | ICD-10-CM

## 2020-12-24 DIAGNOSIS — R202 Paresthesia of skin: Secondary | ICD-10-CM

## 2020-12-24 NOTE — Progress Notes (Signed)
Reason for visit: Gait disorder  Shelly Pearson is an 80 y.o. female  History of present illness:  Shelly Pearson is a 80 year old right-handed white female with a history of a mild gait disorder.  She has reported some numbness in both feet, she has some mild gait instability.  She finds that she does better if she slows down and thinks about walking and tries to lift her feet when she takes a step.  The patient has not had any falls since last seen, she will walk on a daily basis.  She reports that her feet are somewhat uncomfortable, she has put a pillow under her foot so that nothing will touch her feet at night.  The patient has undergone an extensive work-up that has included nerve conduction studies, EMG, MRI of the lumbar and cervical spine, and extensive blood work.  The source of the gait disturbance has not been fully determined.  The patient returns to the office today for further evaluation.  She does not believe that there has been any progression of walking difficulties.  Past Medical History:  Diagnosis Date   GERD (gastroesophageal reflux disease)    Hypertension    Seasonal allergies    Thyroid disease    Urgency incontinence     Past Surgical History:  Procedure Laterality Date   ABDOMINAL HYSTERECTOMY     CESAREAN SECTION     CYSTOCELE REPAIR     ROTATOR CUFF REPAIR Right    2018    Family History  Problem Relation Age of Onset   Stroke Mother    Cancer Father    Cancer Sister    Cancer Brother     Social history:  reports that she has never smoked. She has never used smokeless tobacco. She reports current alcohol use. She reports that she does not use drugs.    Allergies  Allergen Reactions   Adhesive  [Tape] Other (See Comments)    Medications:  Prior to Admission medications   Medication Sig Start Date End Date Taking? Authorizing Provider  atorvastatin (LIPITOR) 20 MG tablet Take 1 tablet (20 mg total) by mouth daily. 09/01/20  Yes Haydee Salter, MD  BIOTIN PO 1 capsule   Yes [provider]  fluticasone (FLONASE) 50 MCG/ACT nasal spray Place into both nostrils daily.   Yes [provider]  levothyroxine (SYNTHROID) 50 MCG tablet Take 1 tablet (50 mcg total) by mouth daily before breakfast. 09/01/20  Yes Haydee Salter, MD  losartan (COZAAR) 25 MG tablet Take 1 tablet (25 mg total) by mouth daily. 09/01/20  Yes RuddLillette Boxer, MD  Multiple Vitamins-Minerals (MATURE ADULT CENTURY PO)    Yes [provider]  trospium (SANCTURA) 20 MG tablet Take 1 tablet (20 mg total) by mouth at bedtime. 09/01/20  Yes Haydee Salter, MD  Polyethyl Glycol-Propyl Glycol (SYSTANE) 0.4-0.3 % GEL ophthalmic gel 1 drop into affected eye as needed    [provider]    ROS:  Out of a complete 14 system review of symptoms, the patient complains only of the following symptoms, and all other reviewed systems are negative.  Gait problems Foot numbness  Blood pressure 117/68, pulse 76, height 5\' 4"  (1.626 m), weight 133 lb (60.3 kg).  Physical Exam  General: The patient is alert and cooperative at the time of the examination.  Skin: No significant peripheral edema is noted.   Neurologic Exam  Mental status: The patient is alert and oriented  x 3 at the time of the examination. The patient has apparent normal recent and remote memory, with an apparently normal attention span and concentration ability.   Cranial nerves: Facial symmetry is present. Speech is normal, no aphasia or dysarthria is noted. Extraocular movements are full. Visual fields are full.  Motor: The patient has good strength in all 4 extremities.  Sensory examination: Soft touch sensation is symmetric on the face, arms, and legs.  Coordination: The patient has good finger-nose-finger and heel-to-shin bilaterally.  Gait and station: The patient has a normal gait. Tandem gait is normal. Romberg is negative. No drift is seen.  Reflexes: Deep  tendon reflexes are symmetric.  Ankle jerk reflexes are well-maintained bilaterally.   MRI cervical 09/21/20:   IMPRESSION: This MRI of the cervical spine without contrast shows the following: 1.   The spinal cord appears normal. 2.   At C4-C5 and C5-C6 there is mild spinal stenosis due to degenerative changes.  There does not appear to be nerve root compression at these levels. 3.   Milder degenerative changes at other cervical levels that do not lead to spinal stenosis or nerve root compression.     MRI lumbar 09/21/20:   IMPRESSION: This MRI of the lumbar spine without contrast shows: 1.   At L4-L5, there is disc protrusion and other degenerative changes causing moderately severe left lateral recess stenosis that could lead to left L5 nerve root compression. 2.   Moderate degenerative changes at other lumbar levels that do not lead to spinal stenosis or nerve root compression.   Addendum: Not mentioned in the body of the report, there are type II Modic degenerative endplate changes at Q3-E0 and mild edema in the endplates surrounding the L5-S1 disc.  * MRI scan images were reviewed online. I agree with the written report.    Assessment/Plan:  1.  Mild gait disturbance  The etiology of the gait disturbance is not clear, the patient however has fairly mild symptoms. We discussed the potential of getting MRI of the brain, but we have decided to follow conservatively at this point, if progression is noted MRI can be done in the future.  She will follow-up here in 6 months, she may be seen through Dr. Krista Blue in the future.  Shelly Alexanders MD 12/24/2020 8:39 AM  [facility] [address] [place], [place] [address]  Phone [phone] Fax [phone]

## 2021-02-15 ENCOUNTER — Encounter: Payer: Self-pay | Admitting: Emergency Medicine

## 2021-02-15 ENCOUNTER — Other Ambulatory Visit: Payer: Self-pay

## 2021-02-15 ENCOUNTER — Ambulatory Visit
Admission: EM | Admit: 2021-02-15 | Discharge: 2021-02-15 | Disposition: A | Discharge status: Home / Self Care | Payer: Medicare Other | Attending: Emergency Medicine | Admitting: Emergency Medicine

## 2021-02-15 ENCOUNTER — Telehealth: Payer: Self-pay | Admitting: Family Medicine

## 2021-02-15 DIAGNOSIS — S0990XA Unspecified injury of head, initial encounter: Secondary | ICD-10-CM | POA: Diagnosis not present

## 2021-02-15 DIAGNOSIS — S0511XA Contusion of eyeball and orbital tissues, right eye, initial encounter: Secondary | ICD-10-CM | POA: Diagnosis not present

## 2021-02-15 MED ORDER — CHLORHEXIDINE GLUCONATE 0.12 % MT SOLN: 15.0000 mL | Freq: Two times a day (BID) | OROMUCOSAL | 0 refills | Status: DC

## 2021-02-15 NOTE — Telephone Encounter (Signed)
Spoke to patient and pt stated she went to urgent care for treatment. Pt will f/u with appt in September with Dr. Gena Fray. Sw, cma

## 2021-02-15 NOTE — Telephone Encounter (Signed)
Pt fell over the weekend and said she hit her face but never felt disoriented, she said she had some swelling in her cheek but didn't really have any bruising. She said she didn't think that she needed to be checked out but her family wanted her to. I let her know that we had some virtual appts open today but were full for in office visits, We did have some available on 02/16/21 which I made her aware of as well and let her know that if they think it might be urgent then we would recommend an urgent care today so she said that is what she would do just to be sure. She didn't want to do a virtual visit

## 2021-02-15 NOTE — ED Triage Notes (Addendum)
Patient c/o fall that happened 2 days ago.   Patient endorses hitting head on a curb, "I tripped on a curb".   Patient denies LOC.   Patient has bruising present under RT eye and bottom lower lip.   Patient denies any blood thinner use.   Patient has taken Tylenol w/ no relief of symptoms.

## 2021-02-15 NOTE — ED Provider Notes (Signed)
[facility]    CSN: [id number] Arrival date & time: 02/15/21  1135      History   Chief Complaint Chief Complaint  Patient presents with   Fall    HPI Shelly Pearson is a 80 y.o. female history of hypertension, GERD, presenting today for evaluation of facial injury.  Reports 2 days ago she was walking in downtown [place] tripped over a curb and landed face forward.  She is sustained bruising and pain around her right eye.  Denies any associated vision changes.  Reports swelling has improved since yesterday.  Reports moving eye appropriately.  She denies any difficulty speaking or moving extremities.  Denies any confusion.  Denies dizziness or lightheadedness.  Has had a mild headache which has been unrelieved with Tylenol.  Denies any light sensitivity.  Denies blood thinner use.  HPI  Past Medical History:  Diagnosis Date   GERD (gastroesophageal reflux disease)    Hypertension    Seasonal allergies    Thyroid disease    Urgency incontinence     Patient Active Problem List   Diagnosis Date Noted   Thyroid goiter 12/10/2020   Urge incontinence of urine 12/10/2020   Mixed hyperlipidemia 12/10/2020   Degenerative cervical spinal stenosis, mild C4-C5 and C5-C6 12/10/2020   Lumbar degenerative disc disease, L4-L5 with left lateral recess stenosis 12/10/2020   Personal history of fall 09/01/2020   Rectal bleeding 08/26/2020   B12 deficiency 03/02/2020   Congenital cavus deformity of both feet 03/02/2020   Metatarsalgia of both feet 03/02/2020   Hand arthritis 03/02/2020   Arthritis of carpometacarpal (CMC) joint of both thumbs 09/19/2019   Overactive bladder 09/19/2019   Irritant contact dermatitis due to drug in contact with skin 07/09/2019   Sciatica 04/26/2019   Paresthesia of both feet 02/04/2019   Eye twitch 02/04/2019   Right hip pain 08/01/2018   Breast pain, left 05/17/2018   Epidermal cyst 04/17/2018   Allergic rhinitis 06/23/2015   Eczema  06/23/2015   Gastro-esophageal reflux disease without esophagitis 06/23/2015   Essential hypertension 06/23/2015   Hypothyroidism 06/23/2015   Iron deficiency anemia 06/23/2015   Fracture of fibula, left, closed 11/13/2012   Nondisplaced fracture of fifth right metatarsal bone 11/13/2012    Past Surgical History:  Procedure Laterality Date   ABDOMINAL HYSTERECTOMY     CESAREAN SECTION     CYSTOCELE REPAIR     ROTATOR CUFF REPAIR Right    2018    OB History   No obstetric history on file.      Home Medications    Prior to Admission medications   Medication Sig Start Date End Date Taking? Authorizing Provider  atorvastatin (LIPITOR) 20 MG tablet Take 1 tablet (20 mg total) by mouth daily. 09/01/20  Yes RuddLillette Boxer, MD  BIOTIN PO 1 capsule   Yes [provider]  chlorhexidine (PERIDEX) 0.12 % solution Use as directed 15 mLs in the mouth or throat 2 (two) times daily. Swish and spit 02/15/21  Yes Jacquelinne Speak, [place] C, PA-C  levothyroxine (SYNTHROID) 50 MCG tablet Take 1 tablet (50 mcg total) by mouth daily before breakfast. 09/01/20  Yes Rudd, Lillette Boxer, MD  losartan (COZAAR) 25 MG tablet Take 1 tablet (25 mg total) by mouth daily. 09/01/20  Yes RuddLillette Boxer, MD  Multiple Vitamins-Minerals (MATURE ADULT CENTURY PO)    Yes [provider]  trospium (SANCTURA) 20 MG tablet Take 1 tablet (20 mg total) by mouth at bedtime. 09/01/20  Yes Haydee Salter, MD  fluticasone [facility]) 50 MCG/ACT nasal spray Place into both nostrils daily.    [provider]    Family History Family History  Problem Relation Age of Onset   Stroke Mother    Cancer Father    Cancer Sister    Cancer Brother     Social History Social History   Tobacco Use   Smoking status: Never   Smokeless tobacco: Never  Vaping Use   Vaping Use: Never used  Substance Use Topics   Alcohol use: Yes    Comment: very rare    Drug use: No     Allergies   Adhesive  [tape]   Review of  Systems Review of Systems  Constitutional:  Negative for activity change, chills, diaphoresis and fatigue.  HENT:  Positive for facial swelling. Negative for ear pain, tinnitus and trouble swallowing.   Eyes:  Negative for photophobia and visual disturbance.  Respiratory:  Negative for cough, chest tightness and shortness of breath.   Cardiovascular:  Negative for chest pain and leg swelling.  Gastrointestinal:  Negative for abdominal pain, blood in stool, nausea and vomiting.  Musculoskeletal:  Negative for arthralgias, back pain, gait problem, myalgias, neck pain and neck stiffness.  Skin:  Positive for color change. Negative for wound.  Neurological:  Negative for dizziness, weakness, light-headedness, numbness and headaches.    Physical Exam Triage Vital Signs ED Triage Vitals  Enc Vitals Group     BP      Pulse      Resp      Temp      Temp src      SpO2      Weight      Height      Head Circumference      Peak Flow      Pain Score      Pain Loc      Pain Edu?      Excl. in [place]?    No data found.  Updated Vital Signs BP 130/78 (BP Location: Left Arm)   Pulse 76   Temp 98.7 F (37.1 C) (Oral)   Resp 16   SpO2 93%   Visual Acuity Right Eye Distance:   Left Eye Distance:   Bilateral Distance:    Right Eye Near:   Left Eye Near:    Bilateral Near:     Physical Exam Vitals and nursing note reviewed.  Constitutional:      Appearance: She is well-developed.     Comments: No acute distress  HENT:     Head: Normocephalic and atraumatic.     Nose: Nose normal.     Comments: No discoloration swelling or tenderness along bridge of nose    Mouth/Throat:     Comments: Mild ecchymosis and swelling noted to right lower lip with small abrasion , oral mucosa pink and moist, no tonsillar enlargement or exudate. Posterior pharynx patent and nonerythematous, no uvula deviation or swelling. Normal phonation.  Eyes:     Extraocular Movements: Extraocular movements intact.      Pupils: Pupils are equal, round, and reactive to light.     Comments: Right periorbital area with purple ecchymosis noted diffusely, no crepitus palpated around orbits, small subconjunctival hemorrhage noted to 6 o'clock position of eye, anterior chamber clear  Cardiovascular:     Rate and Rhythm: Normal rate.  Pulmonary:     Effort: Pulmonary effort is normal. No respiratory distress.     Comments:  Breathing comfortably at rest, CTABL, no wheezing, rales or other adventitious sounds auscultated  Abdominal:     General: There is no distension.  Musculoskeletal:        General: Normal range of motion.     Cervical back: Neck supple.  Skin:    General: Skin is warm and dry.  Neurological:     General: No focal deficit present.     Mental Status: She is alert and oriented to person, place, and time. Mental status is at baseline.     Cranial Nerves: No cranial nerve deficit.     Motor: No weakness.     Comments: Patient A&O x3, cranial nerves II-XII grossly intact, strength at shoulders, hips and knees 5/5, equal bilaterally,Gait without abnormality.      UC Treatments / Results  Labs (all labs ordered are listed, but only abnormal results are displayed) Labs Reviewed - No data to display  EKG   Radiology No results found.  Procedures Procedures (including critical care time)  Medications Ordered in UC Medications - No data to display  Initial Impression / Assessment and Plan / UC Course  I have reviewed the triage vital signs and the nursing notes.  Pertinent labs & imaging results that were available during my care of the patient were reviewed by me and considered in my medical decision making (see chart for details).     Eye contusion-moving right eye in all directions without pain, denies vision changes, no crepitus palpated, lower suspicion of underlying orbital/basilar fracture, recommending ice and anti-inflammatories  Mild head injury-low suspicion of  concussion, discussed warning signs to follow-up in emergency room for, no neurodeficits concerning for underlying intracranial hemorrhage.  Discussed strict return precautions. Patient verbalized understanding and is agreeable with plan.  Final Clinical Impressions(s) / UC Diagnoses   Final diagnoses:  Mild closed head injury, initial encounter  Ecchymosis of right eye, initial encounter     Discharge Instructions      Continue ice and tylenol/ibuprofen May use mouth rinse to help prevent infection and mouth  Please go to emergency room if developing worsening headache, vision changes, unable to move eye in certain directions, difficulty speaking, one-sided weakness, increased dizziness or lightheadedness, vomiting     ED Prescriptions     Medication Sig Dispense Auth. Provider   chlorhexidine (PERIDEX) 0.12 % solution Use as directed 15 mLs in the mouth or throat 2 (two) times daily. Swish and spit 120 mL Roshawn Ayala, [place] C, PA-C      PDMP not reviewed this encounter.   Janith Lima, PA-C 02/15/21 1311

## 2021-02-15 NOTE — Discharge Instructions (Addendum)
Continue ice and tylenol/ibuprofen May use mouth rinse to help prevent infection and mouth  Please go to emergency room if developing worsening headache, vision changes, unable to move eye in certain directions, difficulty speaking, one-sided weakness, increased dizziness or lightheadedness, vomiting

## 2021-03-02 ENCOUNTER — Ambulatory Visit: Payer: PRIVATE HEALTH INSURANCE

## 2021-03-02 ENCOUNTER — Other Ambulatory Visit: Payer: Self-pay | Admitting: Family Medicine

## 2021-03-02 DIAGNOSIS — N3281 Overactive bladder: Secondary | ICD-10-CM

## 2021-03-02 DIAGNOSIS — I10 Essential (primary) hypertension: Secondary | ICD-10-CM

## 2021-03-04 ENCOUNTER — Ambulatory Visit: Payer: PRIVATE HEALTH INSURANCE

## 2021-03-05 ENCOUNTER — Ambulatory Visit: Payer: Medicare Other | Admitting: Family Medicine

## 2021-03-19 ENCOUNTER — Encounter: Payer: Self-pay | Admitting: Family Medicine

## 2021-03-19 ENCOUNTER — Other Ambulatory Visit: Payer: Self-pay

## 2021-03-19 ENCOUNTER — Ambulatory Visit (INDEPENDENT_AMBULATORY_CARE_PROVIDER_SITE_OTHER): Payer: Medicare Other | Admitting: Family Medicine

## 2021-03-19 VITALS — BP 118/80 | HR 74 | Temp 97.4°F | Ht 64.0 in | Wt 132.6 lb

## 2021-03-19 DIAGNOSIS — Z9181 History of falling: Secondary | ICD-10-CM | POA: Diagnosis not present

## 2021-03-19 DIAGNOSIS — N3281 Overactive bladder: Secondary | ICD-10-CM | POA: Diagnosis not present

## 2021-03-19 DIAGNOSIS — I10 Essential (primary) hypertension: Secondary | ICD-10-CM

## 2021-03-19 DIAGNOSIS — E782 Mixed hyperlipidemia: Secondary | ICD-10-CM

## 2021-03-19 DIAGNOSIS — E039 Hypothyroidism, unspecified: Secondary | ICD-10-CM

## 2021-03-19 DIAGNOSIS — Z0000 Encounter for general adult medical examination without abnormal findings: Secondary | ICD-10-CM

## 2021-03-19 DIAGNOSIS — Z23 Encounter for immunization: Secondary | ICD-10-CM | POA: Diagnosis not present

## 2021-03-19 DIAGNOSIS — J301 Allergic rhinitis due to pollen: Secondary | ICD-10-CM | POA: Diagnosis not present

## 2021-03-19 LAB — TSH: TSH: 0.56 u[IU]/mL (ref 0.35–5.50)

## 2021-03-19 NOTE — Progress Notes (Addendum)
[facility] [address] [place] [address] Dept: [phone] Dept Fax: [phone]  Office Visit  Subjective:    Patient ID: Shelly Pearson, female    DOB: 07/07/1940, 80 y.o..   MRN: [id number]  Chief Complaint  Patient presents with   Hypertension     and other chronic conditions. Pt c/o falling 2-3 wks ago with I injury to rt shoulder and rt knee.    History of Present Illness:  Patient is in today for reassessment of her chronic medical conditions. She notes that she had been doing fairly well overall.  Shelly Pearson has a history of hypertension, hyperlipidemia, and hypothyroidism. These are currently managed with losartan, atorvastatin, and levothyroxine. She is apparently stable on these. She also has had an issue with urinary incontinence. She had a previous bladder suspension surgery and is now on trospium and managing well. She does wear a small pad to help with occasional overflow issues.  Review of Systems  Constitutional: Negative.   HENT:  Positive for congestion. Negative for ear discharge, ear pain, hearing loss, nosebleeds, sinus pain and sore throat.        History of allergic rhinitis. Uses OTC meds as needed.  Eyes:  Negative for blurred vision, double vision, pain, discharge and redness.  Respiratory: Negative.    Cardiovascular: Negative.   Gastrointestinal: Negative.   Genitourinary:        Occasional urge incontinence. Managing with trospium.  Musculoskeletal:  Positive for falls. Negative for back pain, myalgias and neck pain.       Shelly Pearson tripped over a curb in downtown [place] about 3 weeks ago. She ended up striking her right face, right shoulder and right hip on the pavement. She developed a large contusion over her right face. She was seen in UC, but not found to have more than contusions. She has some residual tenderness in the right face.  Skin: Negative.   Neurological:  Negative  for dizziness, tingling, sensory change, weakness and headaches.  Endo/Heme/Allergies:  Positive for environmental allergies.       As noted above.    Past Medical History: Patient Active Problem List   Diagnosis Date Noted   Thyroid goiter 12/10/2020   Mixed hyperlipidemia 12/10/2020   Degenerative cervical spinal stenosis, mild C4-C5 and C5-C6 12/10/2020   Lumbar degenerative disc disease, L4-L5 with left lateral recess stenosis 12/10/2020   Personal history of fall 09/01/2020   Rectal bleeding 08/26/2020   Congenital cavus deformity of both feet 03/02/2020   Metatarsalgia of both feet 03/02/2020   Hand arthritis 03/02/2020   Arthritis of carpometacarpal (CMC) joint of both thumbs 09/19/2019   Overactive bladder 09/19/2019   Sciatica 04/26/2019   Paresthesia of both feet 02/04/2019   Eye twitch 02/04/2019   Breast pain, left 05/17/2018   Epidermal cyst 04/17/2018   Allergic rhinitis 06/23/2015   Eczema 06/23/2015   Gastro-esophageal reflux disease without esophagitis 06/23/2015   Essential hypertension 06/23/2015   Hypothyroidism 06/23/2015   Past Surgical History:  Procedure Laterality Date   ABDOMINAL HYSTERECTOMY     CESAREAN SECTION     CYSTOCELE REPAIR     ROTATOR CUFF REPAIR Right    2018   Family History  Problem Relation Age of Onset   Stroke Mother    Cancer Father    Cancer Sister    Cancer Brother    Outpatient Medications Prior to Visit  Medication Sig Dispense Refill   atorvastatin (LIPITOR) 20  MG tablet Take 1 tablet (20 mg total) by mouth daily. 90 tablet 3   BIOTIN PO 1 capsule     levothyroxine (SYNTHROID) 50 MCG tablet Take 1 tablet (50 mcg total) by mouth daily before breakfast. 90 tablet 3   losartan (COZAAR) 25 MG tablet TAKE 1 TABLET DAILY 90 tablet 0   Multiple Vitamins-Minerals (MATURE ADULT CENTURY PO)      trospium (SANCTURA) 20 MG tablet TAKE 1 TABLET AT BEDTIME 90 tablet 0   clobetasol ointment (TEMOVATE) 0.05 % Apply topically.      chlorhexidine (PERIDEX) 0.12 % solution Use as directed 15 mLs in the mouth or throat 2 (two) times daily. Swish and spit 120 mL 0   fluticasone (FLONASE) 50 MCG/ACT nasal spray Place into both nostrils daily.     No facility-administered medications prior to visit.   Allergies  Allergen Reactions   Adhesive  [Tape] Other (See Comments)    Objective:   Today's Vitals   03/19/21 0907  BP: 118/80  Pulse: 74  Temp: (!) 97.4 F (36.3 C)  TempSrc: Temporal  SpO2: 98%  Weight: 132 lb 9.6 oz (60.1 kg)  Height: '5\' 4"'$  (1.626 m)   Body mass index is 22.76 kg/m.   General: Well developed, well nourished. No acute distress. HEENT: Normocephalic, non-traumatic. PERRL, EOMI. Conjunctiva clear. Fundiscopic exam shows normal disc   and vasculature. External ears normal. EAC and TMs normal bilaterally. Nose clear without congestion or   rhinorrhea. Mildly tender over the right zygomatic arch, but no abnormal movement. Mucous membranes moist.   Oropharynx clear. Good dentition. Neck: Supple. No lymphadenopathy. No thyromegaly. Lungs: Clear to auscultation bilaterally. No wheezing, rales or rhonchi. CV: RRR without murmurs or rubs. Pulses 2+ bilaterally. Abdomen: Soft, non-tender. Bowel sounds positive, normal pitch and frequency. No hepatosplenomegaly. No   rebound or guarding. Back: Straight. No CVA tenderness bilaterally. Extremities: Full ROM. No joint swelling or tenderness. No edema noted. Skin: Warm and dry. No rashes. Psych: Alert and oriented. Normal mood and affect.  Health Maintenance Due  Topic Date Due   Hepatitis C Screening  Never done   Zoster Vaccines- Shingrix (1 of 2) Never done   INFLUENZA VACCINE  02/01/2021   COVID-19 Vaccine (5 - Booster for Pfizer series) 03/06/2021     Lab Results: Lab Results  Component Value Date   CHOL 176 09/01/2020   HDL 71.80 09/01/2020   LDLCALC 90 09/01/2020   LDLDIRECT 70.0 03/02/2020   TRIG 73.0 09/01/2020   CHOLHDL 2 09/01/2020    Lab Results  Component Value Date   TSH 0.91 09/01/2020   Assessment & Plan:   1. Preventative health care Shelly Pearson is in good overall health. We will provide her influenza vaccine today, but she is otherwise UTD on immunizations. She plans annual eye exam in the near future.   2. Essential hypertension Blood pressure at goal. Continue losartan  3. Seasonal allergic rhinitis due to pollen Stable with episodic use of OTC meds.  4. Acquired hypothyroidism Due to check on TSH to assure adequacy of levothyroxine.  - TSH  5. Overactive bladder Stable on trospium. Patient doing daily Kegels and feels she is managing this as well as can be expected.  6. Personal history of fall Recent fall appears accidental. Injuries were no worse than bruising and she does not appear to have any significant sequelae.  7. Mixed hyperlipidemia Last lipids were at goal. We will plan to reassess these in March.  8. Need  for influenza vaccination  - Flu Vaccine QUAD High Dose(Fluad)   Haydee Salter, MD

## 2021-03-23 ENCOUNTER — Ambulatory Visit (INDEPENDENT_AMBULATORY_CARE_PROVIDER_SITE_OTHER): Payer: Medicare Other | Admitting: *Deleted

## 2021-03-23 DIAGNOSIS — Z0000 Encounter for general adult medical examination without abnormal findings: Secondary | ICD-10-CM | POA: Diagnosis not present

## 2021-03-23 NOTE — Patient Instructions (Signed)
Shelly Pearson , Thank you for taking time to come for your Medicare Wellness Visit. I appreciate your ongoing commitment to your health goals. Please review the following plan we discussed and let me know if I can assist you in the future.   Screening recommendations/referrals: Colonoscopy: Education provided Mammogram: up to date Bone Density: up to date Recommended yearly ophthalmology/optometry visit for glaucoma screening and checkup Recommended yearly dental visit for hygiene and checkup  Vaccinations: Influenza vaccine: up to date Pneumococcal vaccine: up to date Tdap vaccine: up to date Shingles vaccine: up to date    Advanced directives: on file  Conditions/risks identified:   Next appointment: 09-16-2020 @ 9:00 Dr. Gena Fray   Preventive Care 78 Years and Older, Female Preventive care refers to lifestyle choices and visits with your health care provider that can promote health and wellness. What does preventive care include? A yearly physical exam. This is also called an annual well check. Dental exams once or twice a year. Routine eye exams. Ask your health care provider how often you should have your eyes checked. Personal lifestyle choices, including: Daily care of your teeth and gums. Regular physical activity. Eating a healthy diet. Avoiding tobacco and drug use. Limiting alcohol use. Practicing safe sex. Taking low-dose aspirin every day. Taking vitamin and mineral supplements as recommended by your health care provider. What happens during an annual well check? The services and screenings done by your health care provider during your annual well check will depend on your age, overall health, lifestyle risk factors, and family history of disease. Counseling  Your health care provider may ask you questions about your: Alcohol use. Tobacco use. Drug use. Emotional well-being. Home and relationship well-being. Sexual activity. Eating habits. History of  falls. Memory and ability to understand (cognition). Work and work Statistician. Reproductive health. Screening  You may have the following tests or measurements: Height, weight, and BMI. Blood pressure. Lipid and cholesterol levels. These may be checked every 5 years, or more frequently if you are over 60 years old. Skin check. Lung cancer screening. You may have this screening every year starting at age 78 if you have a 30-pack-year history of smoking and currently smoke or have quit within the past 15 years. Fecal occult blood test (FOBT) of the stool. You may have this test every year starting at age 78. Flexible sigmoidoscopy or colonoscopy. You may have a sigmoidoscopy every 5 years or a colonoscopy every 10 years starting at age 50. Hepatitis C blood test. Hepatitis B blood test. Sexually transmitted disease (STD) testing. Diabetes screening. This is done by checking your blood sugar (glucose) after you have not eaten for a while (fasting). You may have this done every 1-3 years. Bone density scan. This is done to screen for osteoporosis. You may have this done starting at age 78. Mammogram. This may be done every 1-2 years. Talk to your health care provider about how often you should have regular mammograms. Talk with your health care provider about your test results, treatment options, and if necessary, the need for more tests. Vaccines  Your health care provider may recommend certain vaccines, such as: Influenza vaccine. This is recommended every year. Tetanus, diphtheria, and acellular pertussis (Tdap, Td) vaccine. You may need a Td booster every 10 years. Zoster vaccine. You may need this after age 78. Pneumococcal 13-valent conjugate (PCV13) vaccine. One dose is recommended after age 78. Pneumococcal polysaccharide (PPSV23) vaccine. One dose is recommended after age 78. Talk to your health care  provider about which screenings and vaccines you need and how often you need  them. This information is not intended to replace advice given to you by your health care provider. Make sure you discuss any questions you have with your health care provider. Document Released: 07/17/2015 Document Revised: 03/09/2016 Document Reviewed: 04/21/2015 Elsevier Interactive Patient Education  2017 [employer] Prevention in the Home Falls can cause injuries. They can happen to people of all ages. There are many things you can do to make your home safe and to help prevent falls. What can I do on the outside of my home? Regularly fix the edges of walkways and driveways and fix any cracks. Remove anything that might make you trip as you walk through a door, such as a raised step or threshold. Trim any bushes or trees on the path to your home. Use bright outdoor lighting. Clear any walking paths of anything that might make someone trip, such as rocks or tools. Regularly check to see if handrails are loose or broken. Make sure that both sides of any steps have handrails. Any raised decks and porches should have guardrails on the edges. Have any leaves, snow, or ice cleared regularly. Use sand or salt on walking paths during winter. Clean up any spills in your garage right away. This includes oil or grease spills. What can I do in the bathroom? Use night lights. Install grab bars by the toilet and in the tub and shower. Do not use towel bars as grab bars. Use non-skid mats or decals in the tub or shower. If you need to sit down in the shower, use a plastic, non-slip stool. Keep the floor dry. Clean up any water that spills on the floor as soon as it happens. Remove soap buildup in the tub or shower regularly. Attach bath mats securely with double-sided non-slip rug tape. Do not have throw rugs and other things on the floor that can make you trip. What can I do in the bedroom? Use night lights. Make sure that you have a light by your bed that is easy to reach. Do not use  any sheets or blankets that are too big for your bed. They should not hang down onto the floor. Have a firm chair that has side arms. You can use this for support while you get dressed. Do not have throw rugs and other things on the floor that can make you trip. What can I do in the kitchen? Clean up any spills right away. Avoid walking on wet floors. Keep items that you use a lot in easy-to-reach places. If you need to reach something above you, use a strong step stool that has a grab bar. Keep electrical cords out of the way. Do not use floor polish or wax that makes floors slippery. If you must use wax, use non-skid floor wax. Do not have throw rugs and other things on the floor that can make you trip. What can I do with my stairs? Do not leave any items on the stairs. Make sure that there are handrails on both sides of the stairs and use them. Fix handrails that are broken or loose. Make sure that handrails are as long as the stairways. Check any carpeting to make sure that it is firmly attached to the stairs. Fix any carpet that is loose or worn. Avoid having throw rugs at the top or bottom of the stairs. If you do have throw rugs, attach them to the  floor with carpet tape. Make sure that you have a light switch at the top of the stairs and the bottom of the stairs. If you do not have them, ask someone to add them for you. What else can I do to help prevent falls? Wear shoes that: Do not have high heels. Have rubber bottoms. Are comfortable and fit you well. Are closed at the toe. Do not wear sandals. If you use a stepladder: Make sure that it is fully opened. Do not climb a closed stepladder. Make sure that both sides of the stepladder are locked into place. Ask someone to hold it for you, if possible. Clearly mark and make sure that you can see: Any grab bars or handrails. First and last steps. Where the edge of each step is. Use tools that help you move around (mobility aids)  if they are needed. These include: Canes. Walkers. Scooters. Crutches. Turn on the lights when you go into a dark area. Replace any light bulbs as soon as they burn out. Set up your furniture so you have a clear path. Avoid moving your furniture around. If any of your floors are uneven, fix them. If there are any pets around you, be aware of where they are. Review your medicines with your doctor. Some medicines can make you feel dizzy. This can increase your chance of falling. Ask your doctor what other things that you can do to help prevent falls. This information is not intended to replace advice given to you by your health care provider. Make sure you discuss any questions you have with your health care provider. Document Released: 04/16/2009 Document Revised: 11/26/2015 Document Reviewed: 07/25/2014 Elsevier Interactive Patient Education  2017 [employer].

## 2021-03-23 NOTE — Progress Notes (Signed)
Subjective:   GENESI STEFANKO is a 80 y.o. female who presents for Medicare Annual (Subsequent) preventive examination.  I connected with  DORLEEN KISSEL on 03/23/21 by a telephone enabled telemedicine application and verified that I am speaking with the correct person using two identifiers.   I discussed the limitations of evaluation and management by telemedicine. The patient expressed understanding and agreed to proceed.   Review of Systems     Cardiac Risk Factors include: advanced age (>71men, >75 women);hypertension     Objective:    Today's Vitals   There is no height or weight on file to calculate BMI.  Advanced Directives 03/23/2021 02/27/2020 05/28/2019 11/02/2017  Does Patient Have a Medical Advance Directive? Yes Yes No Yes  Type of Paramedic of [place];Living will [place];Living will - [place];Living will  Does patient want to make changes to medical advance directive? - - - No - Patient declined  Copy of [place] in Chart? Yes - validated most recent copy scanned in chart (See row information) No - copy requested - No - copy requested  Would patient like information on creating a medical advance directive? - - No - Patient declined -    Current Medications (verified) Outpatient Encounter Medications as of 03/23/2021  Medication Sig   atorvastatin (LIPITOR) 20 MG tablet Take 1 tablet (20 mg total) by mouth daily.   BIOTIN PO 1 capsule   clobetasol ointment (TEMOVATE) 0.05 % Apply topically.   levothyroxine (SYNTHROID) 50 MCG tablet Take 1 tablet (50 mcg total) by mouth daily before breakfast.   losartan (COZAAR) 25 MG tablet TAKE 1 TABLET DAILY   Multiple Vitamins-Minerals (MATURE ADULT CENTURY PO)    trospium (SANCTURA) 20 MG tablet TAKE 1 TABLET AT BEDTIME   No facility-administered encounter medications on file as of 03/23/2021.    Allergies (verified) Adhesive  [tape]    History: Past Medical History:  Diagnosis Date   B12 deficiency 03/02/2020   GERD (gastroesophageal reflux disease)    Hypertension    Seasonal allergies    Thyroid disease    Urge incontinence of urine 12/10/2020   Urgency incontinence    Past Surgical History:  Procedure Laterality Date   ABDOMINAL HYSTERECTOMY     CESAREAN SECTION     CYSTOCELE REPAIR     ROTATOR CUFF REPAIR Right    2018   Family History  Problem Relation Age of Onset   Stroke Mother    Cancer Father    Cancer Sister    Cancer Brother    Social History   Socioeconomic History   Marital status: Married    Spouse name: Not on file   Number of children: Not on file   Years of education: Not on file   Highest education level: Not on file  Occupational History   Occupation: Retired  Tobacco Use   Smoking status: Never   Smokeless tobacco: Never  Vaping Use   Vaping Use: Never used  Substance and Sexual Activity   Alcohol use: Yes    Comment: very rare    Drug use: No   Sexual activity: Not Currently  Other Topics Concern   Not on file  Social History Narrative   Lives with husband   Right handed   1 cup tea every morning   Social Determinants of Health   Financial Resource Strain: Low Risk    Difficulty of Paying Living Expenses: Not hard  at all  Food Insecurity: No Food Insecurity   Worried About Charity fundraiser in the Last Year: Never true   Ran Out of Food in the Last Year: Never true  Transportation Needs: No Transportation Needs   Lack of Transportation (Medical): No   Lack of Transportation (Non-Medical): No  Physical Activity: Insufficiently Active   Days of Exercise per Week: 3 days   Minutes of Exercise per Session: 30 min  Stress: No Stress Concern Present   Feeling of Stress : Not at all  Social Connections: Socially Integrated   Frequency of Communication with Friends and Family: Twice a week   Frequency of Social Gatherings with Friends and Family: Three times a  week   Attends Religious Services: More than 4 times per year   Active Member of Clubs or Organizations: Yes   Attends Music therapist: More than 4 times per year   Marital Status: Married    Tobacco Counseling Counseling given: Not Answered   Clinical Intake:  Pre-visit preparation completed: Yes  Pain : No/denies pain     Nutritional Risks: None Diabetes: No  How often do you need to have someone help you when you read instructions, pamphlets, or other written materials from your doctor or pharmacy?: 1 - Never  Diabetic?  NO  Interpreter Needed?: No  Information entered by :: Leroy Kennedy LPN   Activities of Daily Living In your present state of health, do you have any difficulty performing the following activities: 03/23/2021  Hearing? N  Vision? N  Difficulty concentrating or making decisions? N  Walking or climbing stairs? Y  Dressing or bathing? N  Doing errands, shopping? N  Preparing Food and eating ? N  Using the Toilet? N  In the past six months, have you accidently leaked urine? Y  Do you have problems with loss of bowel control? N  Managing your Medications? N  Managing your Finances? N  Housekeeping or managing your Housekeeping? N  Some recent data might be hidden    Patient Care Team: Haydee Salter, MD as PCP - General (Family Medicine) Kathrynn Ducking, MD as Consulting Physician (Neurology)  Indicate any recent Medical Services you may have received from other than [facility] providers in the past year (date may be approximate).     Assessment:   This is a routine wellness examination for [place].  Hearing/Vision screen Hearing Screening - Comments:: Not trouble hearing Vision Screening - Comments:: [facility] Phineas Douglas  at [facility] Up to up date  Dietary issues and exercise activities discussed: Current Exercise Habits: Home exercise routine, Type of exercise: walking, Time (Minutes): 40, Frequency (Times/Week): 4, Weekly Exercise  (Minutes/Week): 160, Intensity: Moderate   Goals Addressed             This Visit's Progress    Patient Stated       Continue current healthy lifestyle       Depression Screen PHQ 2/9 Scores 03/23/2021 03/19/2021 02/27/2020 01/30/2019 11/30/2017  PHQ - 2 Score 0 0 0 0 0    Fall Risk Fall Risk  03/23/2021 03/19/2021 02/27/2020 09/19/2019 01/30/2019  Falls in the past year? 1 1 1 1  0  Number falls in past yr: 0 0 0 1 -  Injury with Fall? 1 1 0 0 -  Risk for fall due to : - History of fall(s) - - -  Follow up Falls evaluation completed;Education provided;Falls prevention discussed - Falls prevention discussed - Falls evaluation completed  FALL RISK PREVENTION PERTAINING TO THE HOME:  Any stairs in or around the home? Yes  If so, are there any without handrails? No  Home free of loose throw rugs in walkways, pet beds, electrical cords, etc? Yes  Adequate lighting in your home to reduce risk of falls? Yes   ASSISTIVE DEVICES UTILIZED TO PREVENT FALLS:  Life alert? No  Use of a cane, walker or w/c? No  Grab bars in the bathroom? Yes  Shower chair or bench in shower? Yes  Elevated toilet seat or a handicapped toilet? Yes   TIMED UP AND GO:  Was the test performed? No .    Cognitive Function:  Normal cognitive status assessed by direct observation by this Nurse Health Advisor. No abnormalities found.       6CIT Screen 02/27/2020  What Year? 0 points  What month? 0 points  What time? 0 points  Count back from 20 0 points  Months in reverse 0 points  Repeat phrase 0 points  Total Score 0    Immunizations Immunization History  Administered Date(s) Administered   Fluad Quad(high Dose 65+) 03/26/2019, 03/19/2021   H1N1 07/01/2008   Influenza Split 04/18/2011, 06/11/2013, 04/07/2020   Influenza, High Dose Seasonal PF 04/17/2018   Influenza-Unspecified 05/16/2007, 04/10/2008, 05/03/2012, 03/04/2014   PFIZER Comirnaty(Gray Top)Covid-19 Tri-Sucrose Vaccine 11/03/2020    PFIZER(Purple Top)SARS-COV-2 Vaccination 07/23/2019, 08/13/2019, 04/07/2020   Pneumococcal Conjugate-13 08/15/2013, 04/17/2018   Pneumococcal Polysaccharide-23 04/29/2004, 07/07/2010   Td 11/08/2013   Tdap 03/04/2008   Zoster, Live 07/17/2007    TDAP status: Due, Education has been provided regarding the importance of this vaccine. Advised may receive this vaccine at local pharmacy or Health Dept. Aware to provide a copy of the vaccination record if obtained from local pharmacy or Health Dept. Verbalized acceptance and understanding.  Flu Vaccine status: Up to date  Pneumococcal vaccine status: Up to date  Covid-19 vaccine status: Information provided on how to obtain vaccines.   Qualifies for Shingles Vaccine? Yes   Zostavax completed Yes   Shingrix Completed?: No.    Education has been provided regarding the importance of this vaccine. Patient has been advised to call insurance company to determine out of pocket expense if they have not yet received this vaccine. Advised may also receive vaccine at local pharmacy or Health Dept. Verbalized acceptance and understanding.  Screening Tests Health Maintenance  Topic Date Due   Hepatitis C Screening  Never done   Zoster Vaccines- Shingrix (1 of 2) Never done   COVID-19 Vaccine (5 - Booster for Pfizer series) 03/06/2021   TETANUS/TDAP  11/09/2023   INFLUENZA VACCINE  Completed   DEXA SCAN  Completed   HPV VACCINES  Aged Out    Health Maintenance  Health Maintenance Due  Topic Date Due   Hepatitis C Screening  Never done   Zoster Vaccines- Shingrix (1 of 2) Never done   COVID-19 Vaccine (5 - Booster for Pfizer series) 03/06/2021    Colorectal cancer screening: No longer required.   Mammogram status: Completed  . Repeat every year  Bone Density status: Completed  . Results reflect: Bone density results: OSTEOPOROSIS. Repeat every 2 years.  Lung Cancer Screening: (Low Dose CT Chest recommended if Age 27-80 years, 30 pack-year  currently smoking OR have quit w/in 15years.) does not qualify.   Lung Cancer Screening Referral:   Additional Screening:  Hepatitis C Screening: does qualify;   Vision Screening: Recommended annual ophthalmology exams for early detection of glaucoma and other  disorders of the eye. Is the patient up to date with their annual eye exam?  Yes  Who is the provider or what is the name of the office in which the patient attends annual eye exams? Dr. Phineas Douglas If pt is not established with a provider, would they like to be referred to a provider to establish care? No .   Dental Screening: Recommended annual dental exams for proper oral hygiene  Community Resource Referral / Chronic Care Management: CRR required this visit?  No   CCM required this visit?  No      Plan:     I have personally reviewed and noted the following in the patient's chart:   Medical and social history Use of alcohol, tobacco or illicit drugs  Current medications and supplements including opioid prescriptions.  Functional ability and status Nutritional status Physical activity Advanced directives List of other physicians Hospitalizations, surgeries, and ER visits in previous 12 months Vitals Screenings to include cognitive, depression, and falls Referrals and appointments  In addition, I have reviewed and discussed with patient certain preventive protocols, quality metrics, and best practice recommendations. A written personalized care plan for preventive services as well as general preventive health recommendations were provided to patient.     Leroy Kennedy, LPN   [id number]   Nurse Notes:

## 2021-03-26 NOTE — Addendum Note (Signed)
Addended by: Haydee Salter on: 03/26/2021 05:06 PM   Modules accepted: Level of Service

## 2021-04-08 DIAGNOSIS — Z23 Encounter for immunization: Secondary | ICD-10-CM | POA: Diagnosis not present

## 2021-05-12 ENCOUNTER — Telehealth: Payer: Self-pay | Admitting: Family Medicine

## 2021-05-12 NOTE — Telephone Encounter (Signed)
Pt concerned that she still has bruising and swelling from her major fall three months ago.. would like to speak with Langley Gauss to see if this is "normal" .

## 2021-05-13 NOTE — Telephone Encounter (Signed)
Called patient and she was driving so she will call me back.  Dm/cma

## 2021-05-19 ENCOUNTER — Other Ambulatory Visit: Payer: Self-pay | Admitting: Family Medicine

## 2021-05-19 DIAGNOSIS — N3281 Overactive bladder: Secondary | ICD-10-CM

## 2021-05-19 DIAGNOSIS — I10 Essential (primary) hypertension: Secondary | ICD-10-CM

## 2021-05-20 NOTE — Telephone Encounter (Signed)
Spoke to patient she is still experiencing pain in the check bone area after her fall 3 months ago. Scheduled her an appt with Dr Gena Fray on 05/21/21@ 1:30 pm. Dm/cma

## 2021-05-21 ENCOUNTER — Ambulatory Visit (INDEPENDENT_AMBULATORY_CARE_PROVIDER_SITE_OTHER): Payer: Medicare Other | Admitting: Family Medicine

## 2021-05-21 ENCOUNTER — Other Ambulatory Visit: Payer: Self-pay

## 2021-05-21 VITALS — BP 116/70 | HR 81 | Temp 97.7°F | Ht 64.0 in | Wt 132.6 lb

## 2021-05-21 DIAGNOSIS — R519 Headache, unspecified: Secondary | ICD-10-CM

## 2021-05-21 NOTE — Progress Notes (Signed)
[facility] [address] [place] [address] Dept: [phone] Dept Fax: [phone]  Office Visit  Subjective:    Patient ID: Shelly Pearson, female    DOB: 1940/07/28, 80 y.o..   MRN: [id number]  Chief Complaint  Patient presents with   Acute Visit    C/o still having pain/discomfort in RT cheek area since fall 3 months ago.   Also having soreness in inside nose x 3 weeks.      History of Present Illness:  Patient is in today for re-evaluation of her facial injuries. Ms. Tindel tripped over a curb in downtown [place] [employer]. She ended up striking her right face, right shoulder and right hip on the pavement. She developed a large contusion over her right face. She was seen in UC, but not found to have more than contusions. She did not have x-rays. She has some ongoing tenderness in the right cheek area. She feels this remains swollen. She has not had any visual disturbance. She has had a sore in her right nostril. She denies any dental pain or instability.  Past Medical History: Patient Active Problem List   Diagnosis Date Noted   Thyroid goiter 12/10/2020   Mixed hyperlipidemia 12/10/2020   Degenerative cervical spinal stenosis, mild C4-C5 and C5-C6 12/10/2020   Lumbar degenerative disc disease, L4-L5 with left lateral recess stenosis 12/10/2020   Personal history of fall 09/01/2020   Rectal bleeding 08/26/2020   Congenital cavus deformity of both feet 03/02/2020   Metatarsalgia of both feet 03/02/2020   Hand arthritis 03/02/2020   Arthritis of carpometacarpal (CMC) joint of both thumbs 09/19/2019   Overactive bladder 09/19/2019   Sciatica 04/26/2019   Paresthesia of both feet 02/04/2019   Eye twitch 02/04/2019   Breast pain, left 05/17/2018   Epidermal cyst 04/17/2018   Allergic rhinitis 06/23/2015   Eczema 06/23/2015   Gastro-esophageal reflux disease without esophagitis 06/23/2015   Essential  hypertension 06/23/2015   Hypothyroidism 06/23/2015   Past Surgical History:  Procedure Laterality Date   ABDOMINAL HYSTERECTOMY     CESAREAN SECTION     CYSTOCELE REPAIR     ROTATOR CUFF REPAIR Right    2018   Family History  Problem Relation Age of Onset   Stroke Mother    Cancer Father    Cancer Sister    Cancer Brother     Outpatient Medications Prior to Visit  Medication Sig Dispense Refill   atorvastatin (LIPITOR) 20 MG tablet Take 1 tablet (20 mg total) by mouth daily. 90 tablet 3   BIOTIN PO 1 capsule     levothyroxine (SYNTHROID) 50 MCG tablet Take 1 tablet (50 mcg total) by mouth daily before breakfast. 90 tablet 3   losartan (COZAAR) 25 MG tablet TAKE 1 TABLET DAILY 90 tablet 0   Multiple Vitamins-Minerals (MATURE ADULT CENTURY PO)      trospium (SANCTURA) 20 MG tablet TAKE 1 TABLET AT BEDTIME 90 tablet 0   clobetasol ointment (TEMOVATE) 0.05 % Apply topically. (Patient not taking: Reported on 05/21/2021)     No facility-administered medications prior to visit.   Allergies  Allergen Reactions   Adhesive  [Tape] Other (See Comments)   Objective:   Today's Vitals   05/21/21 1323  BP: 116/70  Pulse: 81  Temp: 97.7 F (36.5 C)  TempSrc: Temporal  SpO2: 97%  Weight: 132 lb 9.6 oz (60.1 kg)  Height: 5\' 4"  (1.626 m)   Body mass index is 22.76  kg/m.   General: Well developed, well nourished. No acute distress. HEENT: PERRL, EOMI. Nose clear without congestion or rhinorrhea. The right cheek is more proiminent than the left. There   is no tenderness on percussion over the sinuses. There is tenderness to palpation over the right zygoma. The [facility] track   normally and are nontender. Mucous membranes moist. Oropharynx clear. Good dentition. Psych: Alert and oriented. Normal mood and affect.  Health Maintenance Due  Topic Date Due   Hepatitis C Screening  Never done   Zoster Vaccines- Shingrix (1 of 2) Never done   COVID-19 Vaccine (5 - Booster for Pfizer  series) 12/29/2020     Assessment & Plan:   1. Right facial pain With the persistence of pain 3 months after the fall, I recommend we get a CT scan to rule out an occult fracture. I did recommend she use Vaseline applied with a cotton swab to the sore inside her right nare.  - CT HEAD WO CONTRAST (5MM); Future  Haydee Salter, MD

## 2021-06-14 ENCOUNTER — Other Ambulatory Visit: Payer: Self-pay | Admitting: Family Medicine

## 2021-06-14 ENCOUNTER — Ambulatory Visit
Admission: RE | Admit: 2021-06-14 | Discharge: 2021-06-14 | Disposition: A | Discharge status: Home / Self Care | Payer: Medicare Other | Source: Ambulatory Visit | Attending: Family Medicine | Admitting: Family Medicine

## 2021-06-14 DIAGNOSIS — R519 Headache, unspecified: Secondary | ICD-10-CM

## 2021-06-14 DIAGNOSIS — S0993XA Unspecified injury of face, initial encounter: Secondary | ICD-10-CM | POA: Diagnosis not present

## 2021-06-14 NOTE — Progress Notes (Signed)
Call from [facility]. Recommend CT Maxillofacial to be able to fully evaluate facial bones for fracture.

## 2021-06-16 DIAGNOSIS — Z1231 Encounter for screening mammogram for malignant neoplasm of breast: Secondary | ICD-10-CM | POA: Diagnosis not present

## 2021-06-16 LAB — HM MAMMOGRAPHY

## 2021-06-18 ENCOUNTER — Encounter: Payer: Self-pay | Admitting: Family Medicine

## 2021-07-12 ENCOUNTER — Other Ambulatory Visit: Payer: Self-pay

## 2021-07-12 ENCOUNTER — Encounter: Payer: Self-pay | Admitting: Neurology

## 2021-07-12 ENCOUNTER — Ambulatory Visit (INDEPENDENT_AMBULATORY_CARE_PROVIDER_SITE_OTHER): Payer: Medicare Other | Admitting: Neurology

## 2021-07-12 VITALS — BP 123/72 | HR 74 | Ht 64.0 in | Wt 130.0 lb

## 2021-07-12 DIAGNOSIS — G3184 Mild cognitive impairment, so stated: Secondary | ICD-10-CM

## 2021-07-12 DIAGNOSIS — R269 Unspecified abnormalities of gait and mobility: Secondary | ICD-10-CM | POA: Diagnosis not present

## 2021-07-12 NOTE — Progress Notes (Signed)
Chief Complaint  Patient presents with   Follow-up    Rm 13 alone   states she fell last August 2022, c/o dropping things, paresthesia has not improved in both hands and feet.         ASSESSMENT AND PLAN  Shelly Pearson is a 81 y.o. female   Slow worsening memory loss, gait abnormality, urinary incontinence,  MRI of the brain to rule out normal pressure hydrocephalus,  Hyperreflexia on examination, MRI of thoracic spine to rule out structural abnormality  Previous MRI of cervical, lumbar spine showed no findings to explain her complaints,   DIAGNOSTIC DATA (LABS, IMAGING, TESTING) - I reviewed patient records, labs, notes, testing and imaging myself where available.   MEDICAL HISTORY:  Shelly Pearson is a 81 year old female, seen in request by her primary care physician Dr.  Arlester Marker for evaluation of gait abnormality, was seen by Dr. Jannifer Franklin since September 2021  I reviewed and summarized the referring note.PMHX HLD HTN Hypothyrodism  She reported many falls of the past few years, broke her ankle, she has now moved to be closer to her daughter since January 2020, due to increased difficulty living alone,  She now still independent, but has learned to be careful, she is sometimes noticed intermittent bilateral hands and the toes paresthesia, dropped things from her hands, tightness difficulty moving her legs  She still walks regularly, 3 miles each day, but has to be careful, moves slowly She was seen by Dr. Jannifer Franklin in the past,  I personally reviewed MRI lumbar in March 2022, mild degenerative disease, no significant canal stenosis, moderate severe left lateral recess stenosis at L4-5, no significant canal stenosis.  MRI of cervical spine, multilevel degenerative changes, no nerve root compression, no significant canal stenosis  EMG nerve conduction study in March 2022 showed no evidence of large fiber peripheral neuropathy  She also complains of mild memory  loss, MoCA examination 24/30 today  She reported a long history of urinary urgency, incontinence,  PHYSICAL EXAM:   Vitals:   07/12/21 0958  BP: 123/72  Pulse: 74  Weight: 130 lb (59 kg)  Height: 5\' 4"  (1.626 m)   Not recorded     Body mass index is 22.31 kg/m.  PHYSICAL EXAMNIATION:  Gen: NAD, conversant, well nourised, well groomed                     Cardiovascular: Regular rate rhythm, no peripheral edema, warm, nontender. Eyes: Conjunctivae clear without exudates or hemorrhage Neck: Supple, no carotid bruits. Pulmonary: Clear to auscultation bilaterally   NEUROLOGICAL EXAM:  MENTAL STATUS: Speech:    Speech is normal; fluent and spontaneous with normal comprehension.  Cognition:     Orientation to time, place and person     Normal recent and remote memory     Normal Attention span and concentration     Normal Language, naming, repeating,spontaneous speech     Fund of knowledge   CRANIAL NERVES: CN II: Visual fields are full to confrontation. Pupils are round equal and briskly reactive to light. CN III, IV, VI: extraocular movement are normal. No ptosis. CN V: Facial sensation is intact to light touch CN VII: Face is symmetric with normal eye closure  CN VIII: Hearing is normal to causal conversation. CN IX, X: Phonation is normal. CN XI: Head turning and shoulder shrug are intact  MOTOR: Normal strength, no significant rigidity or bradykinesia  REFLEXES: Reflexes are 3 and symmetric at the  biceps, triceps, knees, and ankles. Plantar responses are flexor.  SENSORY: Intact to light touch, pinprick and vibratory sensation are intact in fingers and toes.  COORDINATION: There is no trunk or limb dysmetria noted.  GAIT/STANCE: Needs push-up to get up from seated position, stiff, cautious  REVIEW OF SYSTEMS:  Full 14 system review of systems performed and notable only for as above All other review of systems were negative.   ALLERGIES: Allergies   Allergen Reactions   Adhesive  [Tape] Other (See Comments)    HOME MEDICATIONS: Current Outpatient Medications  Medication Sig Dispense Refill   atorvastatin (LIPITOR) 20 MG tablet Take 1 tablet (20 mg total) by mouth daily. 90 tablet 3   BIOTIN PO 1 capsule     levothyroxine (SYNTHROID) 50 MCG tablet Take 1 tablet (50 mcg total) by mouth daily before breakfast. 90 tablet 3   losartan (COZAAR) 25 MG tablet TAKE 1 TABLET DAILY 90 tablet 0   Multiple Vitamins-Minerals (MATURE ADULT CENTURY PO)      trospium (SANCTURA) 20 MG tablet TAKE 1 TABLET AT BEDTIME 90 tablet 0   No current facility-administered medications for this visit.    PAST MEDICAL HISTORY: Past Medical History:  Diagnosis Date   B12 deficiency 03/02/2020   GERD (gastroesophageal reflux disease)    Hypertension    Seasonal allergies    Thyroid disease    Urge incontinence of urine 12/10/2020   Urgency incontinence     PAST SURGICAL HISTORY: Past Surgical History:  Procedure Laterality Date   ABDOMINAL HYSTERECTOMY     CESAREAN SECTION     CYSTOCELE REPAIR     ROTATOR CUFF REPAIR Right    2018    FAMILY HISTORY: Family History  Problem Relation Age of Onset   Stroke Mother    Cancer Father    Cancer Sister    Cancer Brother     SOCIAL HISTORY: Social History   Socioeconomic History   Marital status: Married    Spouse name: Not on file   Number of children: Not on file   Years of education: Not on file   Highest education level: Not on file  Occupational History   Occupation: Retired  Tobacco Use   Smoking status: Never   Smokeless tobacco: Never  Vaping Use   Vaping Use: Never used  Substance and Sexual Activity   Alcohol use: Yes    Comment: very rare    Drug use: No   Sexual activity: Not Currently  Other Topics Concern   Not on file  Social History Narrative   Lives with husband   Right handed   1 cup tea every morning   Social Determinants of Health   Financial Resource  Strain: Low Risk    Difficulty of Paying Living Expenses: Not hard at all  Food Insecurity: No Food Insecurity   Worried About Charity fundraiser in the Last Year: Never true   [place] in the Last Year: Never true  Transportation Needs: No Transportation Needs   Lack of Transportation (Medical): No   Lack of Transportation (Non-Medical): No  Physical Activity: Insufficiently Active   Days of Exercise per Week: 3 days   Minutes of Exercise per Session: 30 min  Stress: No Stress Concern Present   Feeling of Stress : Not at all  Social Connections: Socially Integrated   Frequency of Communication with Friends and Family: Twice a week   Frequency of Social Gatherings with Friends and Family: Three  times a week   Attends Religious Services: More than 4 times per year   Active Member of Clubs or Organizations: Yes   Attends Music therapist: More than 4 times per year   Marital Status: Married  Human resources officer Violence: Not At Risk   Fear of Current or Ex-Partner: No   Emotionally Abused: No   Physically Abused: No   Sexually Abused: No      Marcial Pacas, M.D. Ph.D.  [facility] [address], [place], [place] [address] Ph: [phone] Fax: [phone]  CC:  Haydee Salter, MD [place],  [place] [address]  Haydee Salter, MD

## 2021-07-17 ENCOUNTER — Other Ambulatory Visit: Payer: Medicare Other

## 2021-07-26 ENCOUNTER — Ambulatory Visit
Admission: RE | Admit: 2021-07-26 | Discharge: 2021-07-26 | Disposition: A | Discharge status: Home / Self Care | Payer: Medicare Other | Source: Ambulatory Visit | Attending: Neurology | Admitting: Neurology

## 2021-07-26 DIAGNOSIS — G3184 Mild cognitive impairment, so stated: Secondary | ICD-10-CM

## 2021-07-26 DIAGNOSIS — R269 Unspecified abnormalities of gait and mobility: Secondary | ICD-10-CM | POA: Diagnosis not present

## 2021-08-05 ENCOUNTER — Other Ambulatory Visit: Payer: Self-pay | Admitting: Family Medicine

## 2021-08-05 DIAGNOSIS — I10 Essential (primary) hypertension: Secondary | ICD-10-CM

## 2021-08-05 DIAGNOSIS — N3281 Overactive bladder: Secondary | ICD-10-CM

## 2021-08-05 NOTE — Telephone Encounter (Signed)
Refill request for: Trospium CL 20 mg LR 05/19/21, #90, 0 rf LOV 05/21/21 FOV  09/16/21  Please review and advise.  Thanks. Dm/cma

## 2021-08-08 ENCOUNTER — Other Ambulatory Visit: Payer: Self-pay | Admitting: Family Medicine

## 2021-08-08 DIAGNOSIS — E7800 Pure hypercholesterolemia, unspecified: Secondary | ICD-10-CM

## 2021-08-14 ENCOUNTER — Other Ambulatory Visit: Payer: Self-pay | Admitting: Family Medicine

## 2021-08-14 DIAGNOSIS — E039 Hypothyroidism, unspecified: Secondary | ICD-10-CM

## 2021-09-15 ENCOUNTER — Other Ambulatory Visit: Payer: Self-pay

## 2021-09-16 ENCOUNTER — Ambulatory Visit (INDEPENDENT_AMBULATORY_CARE_PROVIDER_SITE_OTHER): Payer: Medicare Other | Admitting: Family Medicine

## 2021-09-16 VITALS — BP 122/78 | HR 70 | Temp 97.8°F | Ht 64.0 in | Wt 133.6 lb

## 2021-09-16 DIAGNOSIS — N3281 Overactive bladder: Secondary | ICD-10-CM

## 2021-09-16 DIAGNOSIS — E782 Mixed hyperlipidemia: Secondary | ICD-10-CM

## 2021-09-16 DIAGNOSIS — G3184 Mild cognitive impairment, so stated: Secondary | ICD-10-CM | POA: Diagnosis not present

## 2021-09-16 DIAGNOSIS — R29898 Other symptoms and signs involving the musculoskeletal system: Secondary | ICD-10-CM

## 2021-09-16 DIAGNOSIS — R269 Unspecified abnormalities of gait and mobility: Secondary | ICD-10-CM | POA: Diagnosis not present

## 2021-09-16 DIAGNOSIS — E039 Hypothyroidism, unspecified: Secondary | ICD-10-CM | POA: Diagnosis not present

## 2021-09-16 DIAGNOSIS — I10 Essential (primary) hypertension: Secondary | ICD-10-CM | POA: Diagnosis not present

## 2021-09-16 LAB — LIPID PANEL
Cholesterol: 166 mg/dL (ref 0–200)
HDL: 76.9 mg/dL (ref >39.00)
LDL Cholesterol: 63 mg/dL (ref 0–99)
NonHDL: 89.12
Total CHOL/HDL Ratio: 2
Triglycerides: 132 mg/dL (ref 0.0–149.0)
VLDL: 26.4 mg/dL (ref 0.0–40.0)

## 2021-09-16 LAB — TSH: TSH: 1.1 u[IU]/mL (ref 0.35–5.50)

## 2021-09-16 LAB — T4, FREE: Free T4: 1.14 ng/dL (ref 0.60–1.60)

## 2021-09-16 MED ORDER — ATORVASTATIN CALCIUM 20 MG PO TABS: 20.0000 mg | ORAL_TABLET | Freq: Every day | ORAL | 3 refills | Status: DC

## 2021-09-16 MED ORDER — TROSPIUM CHLORIDE 20 MG PO TABS: 20.0000 mg | ORAL_TABLET | Freq: Every day | ORAL | 3 refills | Status: DC

## 2021-09-16 MED ORDER — LOSARTAN POTASSIUM 25 MG PO TABS: 25.0000 mg | ORAL_TABLET | Freq: Every day | ORAL | 3 refills | Status: DC

## 2021-09-16 NOTE — Progress Notes (Signed)
?[facility] ?[place] ?[place] [address] ?Dept: [phone] ?Dept Fax: [phone] ? ?Chronic Care Office Visit ? ?Subjective:  ? ? Patient ID: Shelly Pearson, female    DOB: [birth year], 81 y.o..   MRN: [id number] ? ?Chief Complaint  ?Patient presents with  ? Follow-up  ?  6 month f/u,  c/o memory issues.  Will check on getting shingles at pharmacy.   ? ? ?History of Present Illness: ? ?Patient is in today for reassessment of chronic medical issues. ? ?Shelly Pearson has a history of hypertension, hyperlipidemia, and hypothyroidism. These are currently managed with losartan, atorvastatin, and levothyroxine. She is stable on these.  ? ?She also has had an issue with urinary incontinence. She had a previous bladder suspension surgery and is now on trospium and managing well. She does wear a small pad to help with occasional overflow issues. ? ?Shelly Pearson had been seeing neurology related to several issues. She had been noting that she drops things from her right hand. She has become cautious about what she will try to carry with that hand. She no longer tries to serve Communion at church, as she is afraid she will drop things. She still has to be careful with her gait. She has not had any falls more recently. She had also spoken with Shelly Pearson about memory issues. She notes she has trouble with longer-term memories, such as names of people she has known for years. She notes immediate recall issues. She also finds that she still has to use a navigation system each time she leaves her home in [place], as she gets confused about directions if she doesn't. She has concerns for this as she has a strong family history of dementia (sister, maternal grandmother, maternal aunt). ? ?Past Medical History: ?Patient Active Problem List  ? Diagnosis Date Noted  ? Gait abnormality 07/12/2021  ? Mild cognitive impairment 07/12/2021  ? Thyroid goiter 12/10/2020  ?  Mixed hyperlipidemia 12/10/2020  ? Degenerative cervical spinal stenosis, mild C4-C5 and C5-C6 12/10/2020  ? Lumbar degenerative disc disease, L4-L5 with left lateral recess stenosis 12/10/2020  ? Personal history of fall 09/01/2020  ? Rectal bleeding 08/26/2020  ? Congenital cavus deformity of both feet 03/02/2020  ? Metatarsalgia of both feet 03/02/2020  ? Hand arthritis 03/02/2020  ? Arthritis of carpometacarpal [facility]) joint of both thumbs 09/19/2019  ? Overactive bladder 09/19/2019  ? Sciatica 04/26/2019  ? Paresthesia of both feet 02/04/2019  ? Eye twitch 02/04/2019  ? Breast pain, left 05/17/2018  ? Epidermal cyst 04/17/2018  ? Allergic rhinitis 06/23/2015  ? Eczema 06/23/2015  ? Gastro-esophageal reflux disease without esophagitis 06/23/2015  ? Essential hypertension 06/23/2015  ? Hypothyroidism 06/23/2015  ? ?Past Surgical History:  ?Procedure Laterality Date  ? ABDOMINAL HYSTERECTOMY    ? CESAREAN SECTION    ? CYSTOCELE REPAIR    ? ROTATOR CUFF REPAIR Right   ? 2018  ? ?Family History  ?Problem Relation Age of Onset  ? Stroke Mother   ? Cancer Father   ? Cancer Sister   ? Cancer Brother   ? ?Outpatient Medications Prior to Visit  ?Medication Sig Dispense Refill  ? BIOTIN PO 1 capsule    ? levothyroxine (SYNTHROID) 50 MCG tablet TAKE 1 TABLET DAILY BEFORE BREAKFAST 90 tablet 3  ? Multiple Vitamins-Minerals (MATURE ADULT CENTURY PO)     ? atorvastatin (LIPITOR) 20 MG tablet TAKE 1 TABLET DAILY 90 tablet 0  ? losartan (  COZAAR) 25 MG tablet TAKE 1 TABLET DAILY 90 tablet 0  ? trospium (SANCTURA) 20 MG tablet TAKE 1 TABLET AT BEDTIME 90 tablet 3  ? ?No facility-administered medications prior to visit.  ? ? ?Allergies  ?Allergen Reactions  ? Adhesive  [Tape] Other (See Comments)  ?  ?Objective:  ? ?Today's Vitals  ? 09/16/21 0856  ?BP: 122/78  ?Pulse: 70  ?Temp: 97.8 ?F (36.6 ?C)  ?TempSrc: Temporal  ?SpO2: 98%  ?Weight: 133 lb 9.6 oz (60.6 kg)  ?Height: '5\' 4"'$  (1.626 m)  ? ?Body mass index is 22.93 kg/m?.   ? ?General: Well developed, well nourished. No acute distress. ?Extremities: Right hand has some mild tingling over the thenar eminence. No muscle atrophy noted.  ? Hand strength is equal bilaterally. Mild decrease sensation noted over right thumb. ?Psych: Alert and oriented. Normal mood and affect. ? ?Health Maintenance Due  ?Topic Date Due  ? Hepatitis C Screening  Never done  ? Zoster Vaccines- Shingrix (1 of 2) Never done  ? ?Imaging: ?MR Brain wo contrast (07/26/2021) ?IMPRESSION: This MRI of the brain without contrast shows the following: ?1.   Mild generalized cortical atrophy that is normal for age. ?2.   Few T2/FLAIR hyperintense foci in the hemispheres consistent with very minimal chronic microvascular ischemic change, normal for age. ?3.   No acute findings. ? ?MR Thoracic spine wo contrast (07/26/2021) ?IMPRESSION: This MRI of the thoracic spine without contrast shows the following: ?1.   The spinal cord appears normal. ?2.   Mild multilevel degenerative change as detailed above that do not lead to spinal stenosis or nerve root compression. ? ?Lab Results ?Last lipids ?Lab Results  ?Component Value Date  ? CHOL 176 09/01/2020  ? HDL 71.80 09/01/2020  ? [place] 90 09/01/2020  ? LDLDIRECT 70.0 03/02/2020  ? TRIG 73.0 09/01/2020  ? CHOLHDL 2 09/01/2020  ? ?Last thyroid functions ?Lab Results  ?Component Value Date  ? TSH 0.56 03/19/2021  ?   ?Assessment & Plan:  ? ?1. Essential hypertension ?Blood pressure is at goal. We will continue losartan. ? ?- losartan (COZAAR) 25 MG tablet; Take 1 tablet (25 mg total) by mouth daily.  Dispense: 90 tablet; Refill: 3 ? ?2. Acquired hypothyroidism ?Due for repeat thyroid labs. Continue levothyroxine. ? ?- TSH ?- T4, free ? ?3. Mixed hyperlipidemia ?Due for repeat lipids. Continue atorvastatin. ? ?- Lipid panel ?- atorvastatin (LIPITOR) 20 MG tablet; Take 1 tablet (20 mg total) by mouth daily.  Dispense: 90 tablet; Refill: 3 ? ?4. Mild cognitive impairment ?Shelly Pearson had  identified a mild cognitive impairment. However, Shelly Pearson seems to be having some issues that are impairing her IADLs. I reviewed with her the findings of mild atrophy and possible minor white matter ischemic changes. I tried to reassure her about this and recommended we keep to optimal blood pressure control and lipid management. She still has questions. I feel she should follow up with neurology about this. I am unsure if she would benefit from medicaiton to slow progression of cognitive loss. ? ?5. Gait disturbance ?Stable. Thoracic CT did not show sign of spinal cord issues. ? ?6. Decreased grip strength of right hand ?There may be some carpal tunnel issues. I would hope neurology can reassess for any peripheral nerve impingement issue. ? ?7. Overactive bladder ?Stable on trospium. ? ?- trospium (SANCTURA) 20 MG tablet; Take 1 tablet (20 mg total) by mouth at bedtime.  Dispense: 90 tablet; Refill: 3 ? ? ?Return  in about 6 months (around 03/19/2022) for Reassessment.  ? ?Haydee Salter, MD ?

## 2021-10-19 ENCOUNTER — Telehealth: Payer: Self-pay

## 2021-10-19 NOTE — Telephone Encounter (Signed)
Spoke to patient, she wanted to know if it was okay to get the Covid, shingles an Pneumo shots together.  Advised that we con give here the Pneumo vaccine at her next office visit and the other 2 had to be gotten at the pharmacy.  But not sure on rather they should be given together and advised to ask the pharmacy about that. Dm/cma ? ?

## 2021-10-21 DIAGNOSIS — Z23 Encounter for immunization: Secondary | ICD-10-CM | POA: Diagnosis not present

## 2022-01-11 ENCOUNTER — Encounter: Payer: Self-pay | Admitting: Family Medicine

## 2022-01-11 ENCOUNTER — Ambulatory Visit (INDEPENDENT_AMBULATORY_CARE_PROVIDER_SITE_OTHER): Payer: Medicare Other | Admitting: Family Medicine

## 2022-01-11 VITALS — HR 74 | Ht 64.0 in | Wt 131.0 lb

## 2022-01-11 DIAGNOSIS — Z9181 History of falling: Secondary | ICD-10-CM | POA: Diagnosis not present

## 2022-01-11 DIAGNOSIS — G3184 Mild cognitive impairment, so stated: Secondary | ICD-10-CM | POA: Diagnosis not present

## 2022-01-11 DIAGNOSIS — R269 Unspecified abnormalities of gait and mobility: Secondary | ICD-10-CM

## 2022-01-11 DIAGNOSIS — R202 Paresthesia of skin: Secondary | ICD-10-CM | POA: Diagnosis not present

## 2022-01-11 NOTE — Patient Instructions (Signed)
Below is our plan:  We will continue to monitor symptoms. I will refer you to neuropsychology. Please let me know if you do not hear back to schedule an appt in 2-3 weeks. Talk to PCP regarding possibility of pain in foot being related to arthritis. Consider stretching as discussed.   Please make sure you are staying well hydrated. I recommend 50-60 ounces daily. Well balanced diet and regular exercise encouraged. Consistent sleep schedule with 6-8 hours recommended.   Please continue follow up with care team as directed.   Follow up with me in 6 months  You may receive a survey regarding today's visit. I encourage you to leave honest feed back as I do use this information to improve patient care. Thank you for seeing me today!   Management of Memory Problems   There are some general things you can do to help manage your memory problems.  Your memory may not in fact recover, but by using techniques and strategies you will be able to manage your memory difficulties better.   1)  Establish a routine. Try to establish and then stick to a regular routine.  By doing this, you will get used to what to expect and you will reduce the need to rely on your memory.  Also, try to do things at the same time of day, such as taking your medication or checking your calendar first thing in the morning. Think about think that you can do as a part of a regular routine and make a list.  Then enter them into a daily planner to remind you.  This will help you establish a routine.   2)  Organize your environment. Organize your environment so that it is uncluttered.  Decrease visual stimulation.  Place everyday items such as keys or cell phone in the same place every day (ie.  Basket next to front door) Use post it notes with a brief message to yourself (ie. Turn off light, lock the door) Use labels to indicate where things go (ie. Which cupboards are for food, dishes, etc.) Keep a notepad and pen by the telephone  to take messages   3)  Memory Aids A diary or journal/notebook/daily planner Making a list (shopping list, chore list, to do list that needs to be done) Using an alarm as a reminder (kitchen timer or cell phone alarm) Using cell phone to store information (Notes, Calendar, Reminders) Calendar/White board placed in a prominent position Post-it notes   In order for memory aids to be useful, you need to have good habits.  It's no good remembering to make a note in your journal if you don't remember to look in it.  Try setting aside a certain time of day to look in journal.   4)  Improving mood and managing fatigue. There may be other factors that contribute to memory difficulties.  Factors, such as anxiety, depression and tiredness can affect memory. Regular gentle exercise can help improve your mood and give you more energy. Simple relaxation techniques may help relieve symptoms of anxiety Try to get back to completing activities or hobbies you enjoyed doing in the past. Learn to pace yourself through activities to decrease fatigue. Find out about some local support groups where you can share experiences with others. Try and achieve 7-8 hours of sleep at night.

## 2022-01-11 NOTE — Progress Notes (Signed)
Chief Complaint  Patient presents with   Follow-up    Pt alone, rm 6. Pt stats that she has to always wear shoes on the right foot with walking. She walks and moves a lot throughout the day. If she sits too long she gets a dull/ache pain on the left side/bottom area. We also following memory concerns. She has to keep lists of things.     HISTORY OF PRESENT ILLNESS:  01/11/22 ALL:  Shelly Pearson is a 81 y.o. female here today for follow up for memory loss, gait instability and paresthesias in hands and feet. She was seen in consult with Dr Krista Blue 07/2021. MOCA 24/30. MRI brain and thoracic spine unremarkable.   She reports feeling that there is a rock at the base of her right toes. It can be painful. She wears tennis shoes for extra support. She also has intermittent numbness of toes bilaterally. She has stiffness in feet when waking. She has chronic lumbar back pain. No radicular symptoms. MRI showed degenerative disease without nerve root compression. No recent falls.   She feels memory is fairly stable. She does continue to have difficulty with short term memory. She lives with her husband. She is able to maintain home. Husband worked as an Optometrist and has always paid bills. She performs ADLs independently. She drives without difficulty but does mention using GPS. She has a significant family history of dementia with grandmother and two aunts. She walks about an hour every day.    HISTORY (copied from Dr Rhea Belton previous note)  Shelly Pearson is a 81 year old female, seen in request by her primary care physician Dr.  Arlester Marker for evaluation of gait abnormality, was seen by Dr. Jannifer Franklin since September 2021   I reviewed and summarized the referring note.PMHX HLD HTN Hypothyrodism   She reported many falls of the past few years, broke her ankle, she has now moved to be closer to her daughter since January 2020, due to increased difficulty living alone,  She now still  independent, but has learned to be careful, she is sometimes noticed intermittent bilateral hands and the toes paresthesia, dropped things from her hands, tightness difficulty moving her legs  She still walks regularly, 3 miles each day, but has to be careful, moves slowly She was seen by Dr. Jannifer Franklin in the past,   I personally reviewed MRI lumbar in March 2022, mild degenerative disease, no significant canal stenosis, moderate severe left lateral recess stenosis at L4-5, no significant canal stenosis.  MRI of cervical spine, multilevel degenerative changes, no nerve root compression, no significant canal stenosis  EMG nerve conduction study in March 2022 showed no evidence of large fiber peripheral neuropathy   She also complains of mild memory loss, MoCA examination 24/30 today   She reported a long history of urinary urgency, incontinence,   REVIEW OF SYSTEMS: Out of a complete 14 system review of symptoms, the patient complains only of the following symptoms, right foot pain, short term memory loss and all other reviewed systems are negative.   ALLERGIES: Allergies  Allergen Reactions   Adhesive  [Tape] Other (See Comments)     HOME MEDICATIONS: Outpatient Medications Prior to Visit  Medication Sig Dispense Refill   atorvastatin (LIPITOR) 20 MG tablet Take 1 tablet (20 mg total) by mouth daily. 90 tablet 3   BIOTIN PO 1 capsule     levothyroxine (SYNTHROID) 50 MCG tablet TAKE 1 TABLET DAILY BEFORE BREAKFAST 90 tablet 3  losartan (COZAAR) 25 MG tablet Take 1 tablet (25 mg total) by mouth daily. 90 tablet 3   Multiple Vitamins-Minerals (MATURE ADULT CENTURY PO)      trospium (SANCTURA) 20 MG tablet Take 1 tablet (20 mg total) by mouth at bedtime. 90 tablet 3   No facility-administered medications prior to visit.     PAST MEDICAL HISTORY: Past Medical History:  Diagnosis Date   B12 deficiency 03/02/2020   GERD (gastroesophageal reflux disease)    Hypertension    Seasonal  allergies    Thyroid disease    Urge incontinence of urine 12/10/2020   Urgency incontinence      PAST SURGICAL HISTORY: Past Surgical History:  Procedure Laterality Date   ABDOMINAL HYSTERECTOMY     CESAREAN SECTION     CYSTOCELE REPAIR     ROTATOR CUFF REPAIR Right    2018     FAMILY HISTORY: Family History  Problem Relation Age of Onset   Stroke Mother    Cancer Father    Cancer Sister    Cancer Brother      SOCIAL HISTORY: Social History   Socioeconomic History   Marital status: Married    Spouse name: Not on file   Number of children: Not on file   Years of education: Not on file   Highest education level: Not on file  Occupational History   Occupation: Retired  Tobacco Use   Smoking status: Never   Smokeless tobacco: Never  Vaping Use   Vaping Use: Never used  Substance and Sexual Activity   Alcohol use: Yes    Comment: very rare    Drug use: No   Sexual activity: Not Currently  Other Topics Concern   Not on file  Social History Narrative   Lives with husband   Right handed   1 cup tea every morning   Social Determinants of Health   Financial Resource Strain: Low Risk  (03/23/2021)   Overall Financial Resource Strain (CARDIA)    Difficulty of Paying Living Expenses: Not hard at all  Food Insecurity: No Food Insecurity (03/23/2021)   Hunger Vital Sign    Worried About Running Out of Food in the Last Year: Never true    [place] in the Last Year: Never true  Transportation Needs: No Transportation Needs (03/23/2021)   PRAPARE - Hydrologist (Medical): No    Lack of Transportation (Non-Medical): No  Physical Activity: Insufficiently Active (03/23/2021)   Exercise Vital Sign    Days of Exercise per Week: 3 days    Minutes of Exercise per Session: 30 min  Stress: No Stress Concern Present (03/23/2021)   Franklin    Feeling of Stress : Not at all   Social Connections: [place] (03/23/2021)   Social Connection and Isolation Panel [NHANES]    Frequency of Communication with Friends and Family: Twice a week    Frequency of Social Gatherings with Friends and Family: Three times a week    Attends Religious Services: More than 4 times per year    Active Member of Clubs or Organizations: Yes    Attends Archivist Meetings: More than 4 times per year    Marital Status: Married  Human resources officer Violence: Not At Risk (03/23/2021)   Humiliation, Afraid, Rape, and Kick questionnaire    Fear of Current or Ex-Partner: No    Emotionally Abused: No    Physically  Abused: No    Sexually Abused: No     PHYSICAL EXAM  Vitals:   01/11/22 1047  Pulse: 74  Weight: 131 lb (59.4 kg)  Height: '5\' 4"'$  (1.626 m)   Body mass index is 22.49 kg/m.  Generalized: Well developed, in no acute distress  Cardiology: normal rate and rhythm, no murmur auscultated  Respiratory: clear to auscultation bilaterally    Neurological examination  Mentation: Alert oriented to time, place, history taking. Follows all commands speech and language fluent Cranial nerve II-XII: Pupils were equal round reactive to light. Extraocular movements were full, visual field were full on confrontational test. Facial sensation and strength were normal. Head turning and shoulder shrug  were normal and symmetric. Motor: The motor testing reveals 5 over 5 strength of all 4 extremities. Good symmetric motor tone is noted throughout.  Sensory: Sensory testing is intact to soft touch on all 4 extremities. No evidence of extinction is noted.  Gait and station: Gait is normal.  Reflexes: Deep tendon reflexes are mildly brisk in uppers but symmetric bilaterally.    DIAGNOSTIC DATA (LABS, IMAGING, TESTING) - I reviewed patient records, labs, notes, testing and imaging myself where available.  Lab Results  Component Value Date   WBC 5.1 03/02/2020   HGB 13.4  03/02/2020   HCT 40.3 03/02/2020   MCV 92.1 03/02/2020   PLT 205.0 03/02/2020      Component Value Date/Time   NA 140 09/01/2020 1122   K 3.9 09/01/2020 1122   CL 106 09/01/2020 1122   CO2 30 09/01/2020 1122   GLUCOSE 99 09/01/2020 1122   BUN 19 09/01/2020 1122   CREATININE 0.68 09/01/2020 1122   CALCIUM 9.6 09/01/2020 1122   PROT 6.4 03/02/2020 1443   ALBUMIN 4.3 03/02/2020 1443   AST 17 03/02/2020 1443   ALT 17 03/02/2020 1443   ALKPHOS 67 03/02/2020 1443   BILITOT 0.6 03/02/2020 1443   Lab Results  Component Value Date   CHOL 166 09/16/2021   HDL 76.90 09/16/2021   LDLCALC 63 09/16/2021   LDLDIRECT 70.0 03/02/2020   TRIG 132.0 09/16/2021   CHOLHDL 2 09/16/2021   No results found for: "HGBA1C" Lab Results  Component Value Date   VITAMINB12 422 09/01/2020   Lab Results  Component Value Date   TSH 1.10 09/16/2021        No data to display              01/11/2022   10:50 AM 07/17/2021   12:00 PM  Montreal Cognitive Assessment   Visuospatial/ Executive (0/5) 5 5  Naming (0/3) 3 3  Attention: Read list of digits (0/2) 2 2  Attention: Read list of letters (0/1) 1 1  Attention: Serial 7 subtraction starting at 100 (0/3) 3 2  Language: Repeat phrase (0/2) 1 2  Language : Fluency (0/1) 1 1  Abstraction (0/2) 1 2  Delayed Recall (0/5) 2 0  Orientation (0/6) 5 6  Total 24 24     ASSESSMENT AND PLAN  80 y.o. year old female  has a past medical history of B12 deficiency (03/02/2020), GERD (gastroesophageal reflux disease), Hypertension, Seasonal allergies, Thyroid disease, Urge incontinence of urine (12/10/2020), and Urgency incontinence. here with    Mild cognitive impairment - Plan: Ambulatory referral to Neuropsychology  Paresthesia of both feet  Gait abnormality  Personal history of fall  EDIE VALLANDINGHAM reports symptoms are fairly stable from last visit. She was encouraged to discuss stiffness and pressure pain of  foot with PCP. Could be  arthritic/inflammatory. Numbness is not painful and we will continue to monitor. I have offered to refer her to neuropsychology for formal neurocognitive evaluation. MRI brian showed age related atrophy. She has significant family history of dementia. We have discussed trial of memory medicaitons but she is hesitant at this time. She will continue regular exercise. Memory compensation strategies reviewed. Healthy lifestyle habits encouraged. She will follow up with PCP as directed. She will return to see me in 6 months, sooner if needed. She verbalizes understanding and agreement with this plan.   Orders Placed This Encounter  Procedures   Ambulatory referral to Neuropsychology    Referral Priority:   Routine    Referral Type:   Psychiatric    Referral Reason:   Specialty Services Required    Requested Specialty:   Psychology    Number of Visits Requested:   1     No orders of the defined types were placed in this encounter.    Debbora Presto, MSN, FNP-C 01/11/2022, 2:58 PM  [facility] [address] [place], [place] [address] [phone]

## 2022-01-13 ENCOUNTER — Telehealth: Payer: Self-pay | Admitting: Family Medicine

## 2022-01-13 NOTE — Telephone Encounter (Signed)
Referral sent to [facility] [phone]

## 2022-02-14 ENCOUNTER — Telehealth: Payer: Self-pay | Admitting: Family Medicine

## 2022-02-14 NOTE — Telephone Encounter (Signed)
Referral sent to [facility] [phone]. ?

## 2022-02-14 NOTE — Telephone Encounter (Signed)
Pt has been told that the location her referral was sent to does not address memory issues, pt was told the practice sent a response back to Amy, NP in late July.  Pt asking for a call as to being referred to another office for memory issue

## 2022-02-25 DIAGNOSIS — Z23 Encounter for immunization: Secondary | ICD-10-CM | POA: Diagnosis not present

## 2022-03-22 ENCOUNTER — Encounter: Payer: Self-pay | Admitting: Family Medicine

## 2022-03-22 ENCOUNTER — Ambulatory Visit (INDEPENDENT_AMBULATORY_CARE_PROVIDER_SITE_OTHER): Payer: Medicare Other | Admitting: Family Medicine

## 2022-03-22 VITALS — BP 118/72 | HR 66 | Temp 97.4°F | Ht 64.0 in | Wt 130.2 lb

## 2022-03-22 DIAGNOSIS — E039 Hypothyroidism, unspecified: Secondary | ICD-10-CM | POA: Diagnosis not present

## 2022-03-22 DIAGNOSIS — L309 Dermatitis, unspecified: Secondary | ICD-10-CM

## 2022-03-22 DIAGNOSIS — L84 Corns and callosities: Secondary | ICD-10-CM | POA: Diagnosis not present

## 2022-03-22 DIAGNOSIS — E782 Mixed hyperlipidemia: Secondary | ICD-10-CM

## 2022-03-22 DIAGNOSIS — I10 Essential (primary) hypertension: Secondary | ICD-10-CM | POA: Diagnosis not present

## 2022-03-22 LAB — TSH: TSH: 1.18 u[IU]/mL (ref 0.35–5.50)

## 2022-03-22 NOTE — Progress Notes (Signed)
[facility] [address] [place] [address] Dept: [phone] Dept Fax: [phone]  Chronic Care Office Visit  Subjective:    Patient ID: Shelly Pearson, female    DOB: 09-Mar-1941, 81 y.o..   MRN: [id number]  Chief Complaint  Patient presents with   Follow-up    6 month f/u .  Still having issues with her feet and back of neck being itchy.     History of Present Illness:  Patient is in today for reassessment of chronic medical issues.  Shelly Pearson has a history of hypertension. She is managed on losartan 25 mg daily.  Shelly Pearson has a history of hyperlipidemia. She is managed on atorvastatin 20 mg daily.   Shelly Pearson has a history of hypothyroidism. She is managed on levothyroxine 50 mcg daily.   Shelly Pearson has had an ongoing issue with pain, primarily int he right foot. She had seen a neurologist, but they could not confirm there to be any neuropathy involved. She notes she did have surgery for correction of a bunion some years ago. she wonders if this might be involved. She notes it feels like there is a stone inside the foot when she walks on it. She has some associated numbness int he first 2-3 toes of that foot.  Shelly Pearson has an area of itching and a few bumps at the nape of the neck. She notes she worries a great deal about cancer, as it is prevalent in her family. She has a pending appointment with a dermatologist to assess this.   Past Medical History: Patient Active Problem List   Diagnosis Date Noted   Decreased grip strength of right hand 09/16/2021   Gait disturbance 07/12/2021   Mild cognitive impairment 07/12/2021   Thyroid goiter 12/10/2020   Mixed hyperlipidemia 12/10/2020   Degenerative cervical spinal stenosis, mild C4-C5 and C5-C6 12/10/2020   Lumbar degenerative disc disease, L4-L5 with left lateral recess stenosis 12/10/2020   Personal history of fall 09/01/2020   Rectal bleeding  08/26/2020   Congenital cavus deformity of both feet 03/02/2020   Metatarsalgia of both feet 03/02/2020   Hand arthritis 03/02/2020   Arthritis of carpometacarpal (CMC) joint of both thumbs 09/19/2019   Overactive bladder 09/19/2019   Sciatica 04/26/2019   Paresthesia of both feet 02/04/2019   Eye twitch 02/04/2019   Breast pain, left 05/17/2018   Epidermal cyst 04/17/2018   Allergic rhinitis 06/23/2015   Eczema 06/23/2015   Gastro-esophageal reflux disease without esophagitis 06/23/2015   Essential hypertension 06/23/2015   Hypothyroidism 06/23/2015   Past Surgical History:  Procedure Laterality Date   ABDOMINAL HYSTERECTOMY     CESAREAN SECTION     CYSTOCELE REPAIR     ROTATOR CUFF REPAIR Right    2018   Family History  Problem Relation Age of Onset   Stroke Mother    Cancer Father    Cancer Sister    Cancer Brother    Outpatient Medications Prior to Visit  Medication Sig Dispense Refill   atorvastatin (LIPITOR) 20 MG tablet Take 1 tablet (20 mg total) by mouth daily. 90 tablet 3   BIOTIN PO 1 capsule     levothyroxine (SYNTHROID) 50 MCG tablet TAKE 1 TABLET DAILY BEFORE BREAKFAST 90 tablet 3   losartan (COZAAR) 25 MG tablet Take 1 tablet (25 mg total) by mouth daily. 90 tablet 3   Multiple Vitamins-Minerals (MATURE ADULT CENTURY PO)      trospium (SANCTURA) 20  MG tablet Take 1 tablet (20 mg total) by mouth at bedtime. 90 tablet 3   No facility-administered medications prior to visit.   Allergies  Allergen Reactions   Adhesive  [Tape] Other (See Comments)      Objective:   Today's Vitals   03/22/22 0904  BP: 118/72  Pulse: 66  Temp: (!) 97.4 F (36.3 C)  TempSrc: Temporal  SpO2: 96%  Weight: 130 lb 3.2 oz (59.1 kg)  Height: '5\' 4"'$  (1.626 m)   Body mass index is 22.35 kg/m.   General: Well developed, well nourished. No acute distress. Extremities: Full ROM. Well healed surgical scar over the right 1st MTP area. Pain indicated   on the plantar aspect  of the 1st-3rd MTP joints. There is some callus formation and a   hyperkeratotic lesion over the 2nd metatarsal head. There appears to be some tension in the   extensor tendons for the toes, causing increased pressure over the metatarsal heads. Skin: Warm and dry. Mild thickening of the skin at the nape of the neck with slight redness.   Multiple benign nevi in this area. Psych: Alert and oriented. Normal mood and affect.  Health Maintenance Due  Topic Date Due   Hepatitis C Screening  Never done   Zoster Vaccines- Shingrix (1 of 2) Never done     Assessment & Plan:   1. Essential hypertension Blood pressure is at goal. Continue losartan 25 mg daily.  2. Acquired hypothyroidism Due for repeat TSH. Plan to continue levothyroxine 50 mch daily for now.  - TSH  3. Mixed hyperlipidemia Lipids have been at goal. Continue atorvastatin 20 mg daily.   4. Corn of foot The corn appears to be due to excessive pressure over the metatarsal heads. This may be a source for the foot pain she is experiencing. She might benefit from orthotics. I will refer her to podiatry to evaluate.  - Ambulatory referral to Podiatry  5. Eczema, unspecified type The area at the nape appears to be an eczematous rash. I recommend she use OTC hydrocortisone cream. She has a pending dermatology appointment.   Return in about 6 months (around 09/20/2022) for Reassessment.   Haydee Salter, MD

## 2022-03-25 DIAGNOSIS — Z23 Encounter for immunization: Secondary | ICD-10-CM | POA: Diagnosis not present

## 2022-03-31 ENCOUNTER — Ambulatory Visit (INDEPENDENT_AMBULATORY_CARE_PROVIDER_SITE_OTHER): Payer: Medicare Other | Admitting: Podiatry

## 2022-03-31 DIAGNOSIS — L84 Corns and callosities: Secondary | ICD-10-CM | POA: Diagnosis not present

## 2022-03-31 NOTE — Patient Instructions (Signed)
Look for urea 44% + 2% salicylic acid cream or ointment and apply to the thickened dry skin / calluses. This can be bought over the counter, at a pharmacy or online such as [employer].  Aperture pads (or corn/callus pads) can be bought online as well. They come in foam or felt. Do not use the medicated pads

## 2022-04-02 NOTE — Progress Notes (Signed)
  Subjective:  Patient ID: Shelly Pearson, female    DOB: 17-Jun-1941,  MRN: [id number]  Chief Complaint  Patient presents with   Callouses    Painful callus lesion, < Corn of foot Referring Provider: Arlester Marker M>     81 y.o. female presents with the above complaint. History confirmed with patient.  She developed a hard painful lesion on the bottom of the right  Objective:  Physical Exam: warm, good capillary refill, no trophic changes or ulcerative lesions, normal DP and PT pulses, normal sensory exam, and porokeratosis present submetatarsal 2  Assessment:   1. Callus of foot      Plan:  Patient was evaluated and treated and all questions answered.  We discussed etiology and treatment options of porokeratosis and callus lesions.  Discussed treating with offloading pads and cream such as urea with salicylic acid.  The lesion was debrided as a courtesy today.  We discussed that further debridement in the future likely would not be covered by her insurance and an ABN will need to be signed.  She will return as needed  Return if symptoms worsen or fail to improve.

## 2022-04-15 ENCOUNTER — Telehealth: Payer: Self-pay | Admitting: Family Medicine

## 2022-04-15 NOTE — Telephone Encounter (Signed)
I returned patient's call.  She wants to keep the appointment a phone call.  I let her know not to worry about the questionnaire.

## 2022-04-15 NOTE — Telephone Encounter (Signed)
Pt would like to know if she needs access to a computer or phone where she will be able to have a face to face like the reminder call asked her. Or if this can be done only by phone? She also needs to know if she needs to fill out her Health History before her appointment. She filled this out already for her 03/30/22 appointment that got cancelled. Please advise pt @ [phone]

## 2022-04-19 ENCOUNTER — Ambulatory Visit (INDEPENDENT_AMBULATORY_CARE_PROVIDER_SITE_OTHER): Payer: Medicare Other

## 2022-04-19 VITALS — Ht 64.0 in | Wt 130.0 lb

## 2022-04-19 DIAGNOSIS — Z0000 Encounter for general adult medical examination without abnormal findings: Secondary | ICD-10-CM | POA: Diagnosis not present

## 2022-04-19 NOTE — Progress Notes (Addendum)
Subjective:   Shelly Pearson is a 81 y.o. female who presents for Medicare Annual (Subsequent) preventive examination.   I connected with  Shelly Pearson on 04/19/22 by a audio enabled telemedicine application and verified that I am speaking with the correct person using two identifiers.  Patient Location: Home  Provider Location: Home Office  I discussed the limitations of evaluation and management by telemedicine. The patient expressed understanding and agreed to proceed.  Review of Systems     Cardiac Risk Factors include: advanced age (>15mn, >>33women)     Objective:    Today's Vitals   04/19/22 0859  Weight: 130 lb (59 kg)  Height: '5\' 4"'$  (1.626 m)   Body mass index is 22.31 kg/m.     04/19/2022    9:09 AM 03/23/2021    9:24 AM 02/27/2020   10:40 AM 05/28/2019    5:05 PM 11/02/2017    1:02 PM  Advanced Directives  Does Patient Have a Medical Advance Directive? Yes Yes Yes No Yes  Type of AParamedicof [place]Living will [place] will [place] will  [place] will  Does patient want to make changes to medical advance directive? No - Patient declined    No - Patient declined  Copy of [place] Chart? Yes - validated most recent copy scanned in chart (See row information) Yes - validated most recent copy scanned in chart (See row information) No - copy requested  No - copy requested  Would patient like information on creating a medical advance directive?    No - Patient declined     Current Medications (verified) Outpatient Encounter Medications as of 04/19/2022  Medication Sig   atorvastatin (LIPITOR) 20 MG tablet Take 1 tablet (20 mg total) by mouth daily.   BIOTIN PO 1 capsule   levothyroxine (SYNTHROID) 50 MCG tablet TAKE 1 TABLET DAILY BEFORE BREAKFAST   losartan (COZAAR) 25 MG tablet Take 1 tablet (25 mg total) by mouth daily.    Multiple Vitamins-Minerals (MATURE ADULT CENTURY PO)    trospium (SANCTURA) 20 MG tablet Take 1 tablet (20 mg total) by mouth at bedtime.   No facility-administered encounter medications on file as of 04/19/2022.    Allergies (verified) Adhesive  [tape]   History: Past Medical History:  Diagnosis Date   B12 deficiency 03/02/2020   GERD (gastroesophageal reflux disease)    Hypertension    Seasonal allergies    Thyroid disease    Urge incontinence of urine 12/10/2020   Urgency incontinence    Past Surgical History:  Procedure Laterality Date   ABDOMINAL HYSTERECTOMY     CESAREAN SECTION     CYSTOCELE REPAIR     ROTATOR CUFF REPAIR Right    2018   Family History  Problem Relation Age of Onset   Stroke Mother    Cancer Father    Cancer Sister    Cancer Brother    Social History   Socioeconomic History   Marital status: Married    Spouse name: Not on file   Number of children: Not on file   Years of education: Not on file   Highest education level: Not on file  Occupational History   Occupation: Retired  Tobacco Use   Smoking status: Never   Smokeless tobacco: Never  Vaping Use   Vaping Use: Never used  Substance and Sexual Activity   Alcohol use: Yes  Comment: very rare    Drug use: No   Sexual activity: Not Currently  Other Topics Concern   Not on file  Social History Narrative   Lives with husband   Right handed   1 cup tea every morning   Social Determinants of Health   Financial Resource Strain: Low Risk  (04/19/2022)   Overall Financial Resource Strain (CARDIA)    Difficulty of Paying Living Expenses: Not hard at all  Food Insecurity: No Food Insecurity (04/19/2022)   Hunger Vital Sign    Worried About Running Out of Food in the Last Year: Never true    Ran Out of Food in the Last Year: Never true  Transportation Needs: No Transportation Needs (03/23/2021)   PRAPARE - Hydrologist (Medical): No    Lack of  Transportation (Non-Medical): No  Physical Activity: Insufficiently Active (04/19/2022)   Exercise Vital Sign    Days of Exercise per Week: 3 days    Minutes of Exercise per Session: 30 min  Stress: No Stress Concern Present (04/19/2022)   [place]    Feeling of Stress : Not at all  Social Connections: [place] (04/19/2022)   Social Connection and Isolation Panel [NHANES]    Frequency of Communication with Friends and Family: More than three times a week    Frequency of Social Gatherings with Friends and Family: More than three times a week    Attends Religious Services: More than 4 times per year    Active Member of [employer] or Organizations: Yes    Attends Music therapist: More than 4 times per year    Marital Status: Married    Tobacco Counseling Counseling given: Not Answered   Clinical Intake:  Pre-visit preparation completed: Yes  Pain : No/denies pain     Nutritional Risks: None Diabetes: No  How often do you need to have someone help you when you read instructions, pamphlets, or other written materials from your doctor or pharmacy?: 1 - Never  Diabetic?no   Interpreter Needed?: No  Information entered by :: Jadene Pierini, LPN   Activities of Daily Living    04/19/2022    9:10 AM 04/18/2022    8:37 AM  In your present state of health, do you have any difficulty performing the following activities:  Hearing? 0 0  Vision? 0 0  Difficulty concentrating or making decisions? 0 1  Walking or climbing stairs? 0 0  Dressing or bathing? 0 0  Doing errands, shopping? 0 0  Preparing Food and eating ? N N  Using the Toilet? N N  In the past six months, have you accidently leaked urine? N Y  Do you have problems with loss of bowel control? N N  Managing your Medications? N N  Managing your Finances? N N  Housekeeping or managing your Housekeeping? N N    Patient Care  Team: Haydee Salter, MD as PCP - General (Family Medicine) Kathrynn Ducking, MD (Inactive) as Consulting Physician (Neurology)  Indicate any recent Medical Services you may have received from other than [facility] providers in the past year (date may be approximate).     Assessment:   This is a routine wellness examination for Shelly Pearson.  Hearing/Vision screen Vision Screening - Comments:: Annual eye exams   Dietary issues and exercise activities discussed:     Goals Addressed  This Visit's Progress    Exercise 3x per week (30 min per time)   On track      Depression Screen    04/19/2022    9:09 AM 03/23/2021    9:40 AM 03/19/2021    9:04 AM 02/27/2020   10:44 AM 01/30/2019    9:43 AM 11/30/2017    2:05 PM  PHQ 2/9 Scores  PHQ - 2 Score 0 0 0 0 0 0    Fall Risk    04/19/2022    9:00 AM 04/18/2022    8:37 AM 03/29/2022   11:22 AM 03/23/2021    9:21 AM 03/19/2021    9:05 AM  Fall Risk   Falls in the past year? '1 1 1   1 1 1  '$ Number falls in past yr: '1 1 1   1 '$ 0 0  Injury with Fall? '1 1 1   1 1 1  '$ Risk for fall due to : History of fall(s);Impaired balance/gait;Orthopedic patient    History of fall(s)  Follow up Education provided;Falls prevention discussed   Falls evaluation completed;Education provided;Falls prevention discussed     FALL RISK PREVENTION PERTAINING TO THE HOME:  Any stairs in or around the home? Yes  If so, are there any without handrails? No  Home free of loose throw rugs in walkways, pet beds, electrical cords, etc? Yes  Adequate lighting in your home to reduce risk of falls? Yes   ASSISTIVE DEVICES UTILIZED TO PREVENT FALLS:  Life alert? No  Use of a cane, walker or w/c? No  Grab bars in the bathroom? Yes  Shower chair or bench in shower? Yes  Elevated toilet seat or a handicapped toilet? Yes        01/11/2022   10:50 AM 07/17/2021   12:00 PM  Montreal Cognitive Assessment   Visuospatial/ Executive (0/5) 5 5  Naming (0/3) 3 3   Attention: Read list of digits (0/2) 2 2  Attention: Read list of letters (0/1) 1 1  Attention: Serial 7 subtraction starting at 100 (0/3) 3 2  Language: Repeat phrase (0/2) 1 2  Language : Fluency (0/1) 1 1  Abstraction (0/2) 1 2  Delayed Recall (0/5) 2 0  Orientation (0/6) 5 6  Total 24 24      04/19/2022    9:15 AM 04/19/2022    9:10 AM 02/27/2020   10:54 AM  6CIT Screen  What Year? 0 points 0 points 0 points  What month? 0 points 0 points 0 points  What time? 0 points 0 points 0 points  Count back from 20 0 points 0 points 0 points  Months in reverse 0 points 0 points 0 points  Repeat phrase 0 points 0 points 0 points  Total Score 0 points 0 points 0 points    Immunizations Immunization History  Administered Date(s) Administered   Fluad Quad(high Dose 65+) 03/26/2019, 03/19/2021   H1N1 07/01/2008   Influenza Split 04/18/2011, 04/07/2020, 03/08/2022   Influenza, High Dose Seasonal PF 04/17/2018   Influenza-Unspecified 05/16/2007, 04/10/2008, 05/03/2012, 03/04/2014   PFIZER Comirnaty(Gray Top)Covid-19 Tri-Sucrose Vaccine 11/03/2020   PFIZER(Purple Top)SARS-COV-2 Vaccination 07/23/2019, 08/13/2019, 04/07/2020   Pneumococcal Conjugate-13 08/15/2013, 04/17/2018   Pneumococcal Polysaccharide-23 04/29/2004, 07/07/2010   Respiratory Syncytial Virus Vaccine,Recomb Aduvanted(Arexvy) 03/11/2022   Td 11/08/2013   Tdap 03/04/2008   Zoster Recombinat (Shingrix) 03/11/2022   Zoster, Live 07/17/2007    TDAP status: Up to date  Flu Vaccine status: Up to date  Pneumococcal vaccine status: Up  to date  Covid-19 vaccine status: Completed vaccines  Qualifies for Shingles Vaccine? Yes   Zostavax completed Yes   Shingrix Completed?: Yes  Screening Tests Health Maintenance  Topic Date Due   Hepatitis C Screening  Never done   COVID-19 Vaccine (5 - Pfizer series) 12/29/2020   Zoster Vaccines- Shingrix (2 of 2) 05/06/2022   TETANUS/TDAP  11/09/2023   Pneumonia Vaccine 27+  Years old  Completed   INFLUENZA VACCINE  Completed   DEXA SCAN  Completed   HPV VACCINES  Aged Out   COLONOSCOPY (Pts 45-68yr Insurance coverage will need to be confirmed)  Discontinued    Health Maintenance  Health Maintenance Due  Topic Date Due   Hepatitis C Screening  Never done   COVID-19 Vaccine (5 - Pfizer series) 12/29/2020    Colorectal cancer screening: No longer required.   Mammogram status: No longer required due to age.  Bone Density status: Completed 07/24/2020. Results reflect: Bone density results: OSTEOPENIA. Repeat every 5 years.  Lung Cancer Screening: (Low Dose CT Chest recommended if Age 79-80years, 30 pack-year currently smoking OR have quit w/in 15years.) does not qualify.   Lung Cancer Screening Referral: n/a  Additional Screening:  Hepatitis C Screening: does not qualify;   Vision Screening: Recommended annual ophthalmology exams for early detection of glaucoma and other disorders of the eye. Is the patient up to date with their annual eye exam?  Yes  Who is the provider or what is the name of the office in which the patient attends annual eye exams? [facility] If pt is not established with a provider, would they like to be referred to a provider to establish care? No .   Dental Screening: Recommended annual dental exams for proper oral hygiene  Community Resource Referral / Chronic Care Management: CRR required this visit?  No   CCM required this visit?  No      Plan:     I have personally reviewed and noted the following in the patient's chart:   Medical and social history Use of alcohol, tobacco or illicit drugs  Current medications and supplements including opioid prescriptions. Patient is not currently taking opioid prescriptions. Functional ability and status Nutritional status Physical activity Advanced directives List of other physicians Hospitalizations, surgeries, and ER visits in previous 12 months Vitals Screenings to  include cognitive, depression, and falls Referrals and appointments  In addition, I have reviewed and discussed with patient certain preventive protocols, quality metrics, and best practice recommendations. A written personalized care plan for preventive services as well as general preventive health recommendations were provided to patient.     LDaphane Shepherd LPN   [id number]  Nurse Notes: none

## 2022-04-19 NOTE — Patient Instructions (Signed)
Shelly Pearson , Thank you for taking time to come for your Medicare Wellness Visit. I appreciate your ongoing commitment to your health goals. Please review the following plan we discussed and let me know if I can assist you in the future.   These are the goals we discussed:  Goals      Exercise 3x per week (30 min per time)     Patient Stated     Continue healthy eating plan & walking     Patient Stated     Continue current healthy lifestyle        This is a list of the screening recommended for you and due dates:  Health Maintenance  Topic Date Due   Hepatitis C Screening: USPSTF Recommendation to screen - Ages 75-79 yo.  Never done   COVID-19 Vaccine (5 - Pfizer series) 12/29/2020   Zoster (Shingles) Vaccine (2 of 2) 05/06/2022   Tetanus Vaccine  11/09/2023   Pneumonia Vaccine  Completed   Flu Shot  Completed   DEXA scan (bone density measurement)  Completed   HPV Vaccine  Aged Out   Colon Cancer Screening  Discontinued    Advanced directives: In Chart  Conditions/risks identified: Aim for 30 minutes of exercise or brisk walking, 6-8 glasses of water, and 5 servings of fruits and vegetables each day.   Next appointment: Follow up in one year for your annual wellness visit    Preventive Care 65 Years and Older, Female Preventive care refers to lifestyle choices and visits with your health care provider that can promote health and wellness. What does preventive care include? A yearly physical exam. This is also called an annual well check. Dental exams once or twice a year. Routine eye exams. Ask your health care provider how often you should have your eyes checked. Personal lifestyle choices, including: Daily care of your teeth and gums. Regular physical activity. Eating a healthy diet. Avoiding tobacco and drug use. Limiting alcohol use. Practicing safe sex. Taking low-dose aspirin every day. Taking vitamin and mineral supplements as recommended by your health  care provider. What happens during an annual well check? The services and screenings done by your health care provider during your annual well check will depend on your age, overall health, lifestyle risk factors, and family history of disease. Counseling  Your health care provider may ask you questions about your: Alcohol use. Tobacco use. Drug use. Emotional well-being. Home and relationship well-being. Sexual activity. Eating habits. History of falls. Memory and ability to understand (cognition). Work and work Statistician. Reproductive health. Screening  You may have the following tests or measurements: Height, weight, and BMI. Blood pressure. Lipid and cholesterol levels. These may be checked every 5 years, or more frequently if you are over 74 years old. Skin check. Lung cancer screening. You may have this screening every year starting at age 47 if you have a 30-pack-year history of smoking and currently smoke or have quit within the past 15 years. Fecal occult blood test (FOBT) of the stool. You may have this test every year starting at age 35. Flexible sigmoidoscopy or colonoscopy. You may have a sigmoidoscopy every 5 years or a colonoscopy every 10 years starting at age 76. Hepatitis C blood test. Hepatitis B blood test. Sexually transmitted disease (STD) testing. Diabetes screening. This is done by checking your blood sugar (glucose) after you have not eaten for a while (fasting). You may have this done every 1-3 years. Bone density scan. This is done  to screen for osteoporosis. You may have this done starting at age 42. Mammogram. This may be done every 1-2 years. Talk to your health care provider about how often you should have regular mammograms. Talk with your health care provider about your test results, treatment options, and if necessary, the need for more tests. Vaccines  Your health care provider may recommend certain vaccines, such as: Influenza vaccine. This is  recommended every year. Tetanus, diphtheria, and acellular pertussis (Tdap, Td) vaccine. You may need a Td booster every 10 years. Zoster vaccine. You may need this after age 18. Pneumococcal 13-valent conjugate (PCV13) vaccine. One dose is recommended after age 33. Pneumococcal polysaccharide (PPSV23) vaccine. One dose is recommended after age 24. Talk to your health care provider about which screenings and vaccines you need and how often you need them. This information is not intended to replace advice given to you by your health care provider. Make sure you discuss any questions you have with your health care provider. Document Released: 07/17/2015 Document Revised: 03/09/2016 Document Reviewed: 04/21/2015 Elsevier Interactive Patient Education  2017 [place] Prevention in the Home Falls can cause injuries. They can happen to people of all ages. There are many things you can do to make your home safe and to help prevent falls. What can I do on the outside of my home? Regularly fix the edges of walkways and driveways and fix any cracks. Remove anything that might make you trip as you walk through a door, such as a raised step or threshold. Trim any bushes or trees on the path to your home. Use bright outdoor lighting. Clear any walking paths of anything that might make someone trip, such as rocks or tools. Regularly check to see if handrails are loose or broken. Make sure that both sides of any steps have handrails. Any raised decks and porches should have guardrails on the edges. Have any leaves, snow, or ice cleared regularly. Use sand or salt on walking paths during winter. Clean up any spills in your garage right away. This includes oil or grease spills. What can I do in the bathroom? Use night lights. Install grab bars by the toilet and in the tub and shower. Do not use towel bars as grab bars. Use non-skid mats or decals in the tub or shower. If you need to sit down in  the shower, use a plastic, non-slip stool. Keep the floor dry. Clean up any water that spills on the floor as soon as it happens. Remove soap buildup in the tub or shower regularly. Attach bath mats securely with double-sided non-slip rug tape. Do not have throw rugs and other things on the floor that can make you trip. What can I do in the bedroom? Use night lights. Make sure that you have a light by your bed that is easy to reach. Do not use any sheets or blankets that are too big for your bed. They should not hang down onto the floor. Have a firm chair that has side arms. You can use this for support while you get dressed. Do not have throw rugs and other things on the floor that can make you trip. What can I do in the kitchen? Clean up any spills right away. Avoid walking on wet floors. Keep items that you use a lot in easy-to-reach places. If you need to reach something above you, use a strong step stool that has a grab bar. Keep electrical cords out of the  way. Do not use floor polish or wax that makes floors slippery. If you must use wax, use non-skid floor wax. Do not have throw rugs and other things on the floor that can make you trip. What can I do with my stairs? Do not leave any items on the stairs. Make sure that there are handrails on both sides of the stairs and use them. Fix handrails that are broken or loose. Make sure that handrails are as Minnie as the stairways. Check any carpeting to make sure that it is firmly attached to the stairs. Fix any carpet that is loose or worn. Avoid having throw rugs at the top or bottom of the stairs. If you do have throw rugs, attach them to the floor with carpet tape. Make sure that you have a light switch at the top of the stairs and the bottom of the stairs. If you do not have them, ask someone to add them for you. What else can I do to help prevent falls? Wear shoes that: Do not have high heels. Have rubber bottoms. Are comfortable  and fit you well. Are closed at the toe. Do not wear sandals. If you use a stepladder: Make sure that it is fully opened. Do not climb a closed stepladder. Make sure that both sides of the stepladder are locked into place. Ask someone to hold it for you, if possible. Clearly mark and make sure that you can see: Any grab bars or handrails. First and last steps. Where the edge of each step is. Use tools that help you move around (mobility aids) if they are needed. These include: Canes. Walkers. Scooters. Crutches. Turn on the lights when you go into a dark area. Replace any light bulbs as soon as they burn out. Set up your furniture so you have a clear path. Avoid moving your furniture around. If any of your floors are uneven, fix them. If there are any pets around you, be aware of where they are. Review your medicines with your doctor. Some medicines can make you feel dizzy. This can increase your chance of falling. Ask your doctor what other things that you can do to help prevent falls. This information is not intended to replace advice given to you by your health care provider. Make sure you discuss any questions you have with your health care provider. Document Released: 04/16/2009 Document Revised: 11/26/2015 Document Reviewed: 07/25/2014 Elsevier Interactive Patient Education  2017 [employer].

## 2022-05-18 DIAGNOSIS — L820 Inflamed seborrheic keratosis: Secondary | ICD-10-CM | POA: Diagnosis not present

## 2022-05-18 DIAGNOSIS — Z129 Encounter for screening for malignant neoplasm, site unspecified: Secondary | ICD-10-CM | POA: Diagnosis not present

## 2022-05-18 DIAGNOSIS — L218 Other seborrheic dermatitis: Secondary | ICD-10-CM | POA: Diagnosis not present

## 2022-05-18 DIAGNOSIS — L821 Other seborrheic keratosis: Secondary | ICD-10-CM | POA: Diagnosis not present

## 2022-05-18 DIAGNOSIS — Z808 Family history of malignant neoplasm of other organs or systems: Secondary | ICD-10-CM | POA: Diagnosis not present

## 2022-05-18 DIAGNOSIS — D485 Neoplasm of uncertain behavior of skin: Secondary | ICD-10-CM | POA: Diagnosis not present

## 2022-05-23 DIAGNOSIS — R413 Other amnesia: Secondary | ICD-10-CM | POA: Diagnosis not present

## 2022-05-24 DIAGNOSIS — H353211 Exudative age-related macular degeneration, right eye, with active choroidal neovascularization: Secondary | ICD-10-CM | POA: Insufficient documentation

## 2022-05-24 DIAGNOSIS — H353290 Exudative age-related macular degeneration, unspecified eye, stage unspecified: Secondary | ICD-10-CM | POA: Insufficient documentation

## 2022-06-01 DIAGNOSIS — H43813 Vitreous degeneration, bilateral: Secondary | ICD-10-CM | POA: Diagnosis not present

## 2022-06-01 DIAGNOSIS — H35453 Secondary pigmentary degeneration, bilateral: Secondary | ICD-10-CM | POA: Diagnosis not present

## 2022-06-01 DIAGNOSIS — H35721 Serous detachment of retinal pigment epithelium, right eye: Secondary | ICD-10-CM | POA: Diagnosis not present

## 2022-06-01 DIAGNOSIS — H353121 Nonexudative age-related macular degeneration, left eye, early dry stage: Secondary | ICD-10-CM | POA: Diagnosis not present

## 2022-06-01 DIAGNOSIS — H25813 Combined forms of age-related cataract, bilateral: Secondary | ICD-10-CM | POA: Diagnosis not present

## 2022-06-01 DIAGNOSIS — H35363 Drusen (degenerative) of macula, bilateral: Secondary | ICD-10-CM | POA: Diagnosis not present

## 2022-06-01 DIAGNOSIS — H353211 Exudative age-related macular degeneration, right eye, with active choroidal neovascularization: Secondary | ICD-10-CM | POA: Diagnosis not present

## 2022-06-02 DIAGNOSIS — R413 Other amnesia: Secondary | ICD-10-CM | POA: Diagnosis not present

## 2022-06-07 ENCOUNTER — Telehealth: Payer: Self-pay | Admitting: Family Medicine

## 2022-06-07 NOTE — Telephone Encounter (Signed)
I have reviewed neurocog eval with Dr Curly Rim at [facility]. Subtle indications of mild memory decline particularly evident in her delayed recall compared to immediate recall. Spontaneous recall showed slight deficit. Slight reduction in executive skills particularly in complex attention, fluency, and abstract problem solving. Deficits could be contributed to sleep deficiency and anxiety, both noted with clinical observation, or could represent early signs of neurodegenerative disease. Family history of dementia considered. Will discuss results with her at appt in 07/2022 and consider adding donepezil if appropriate.

## 2022-06-08 DIAGNOSIS — H353211 Exudative age-related macular degeneration, right eye, with active choroidal neovascularization: Secondary | ICD-10-CM | POA: Diagnosis not present

## 2022-06-17 DIAGNOSIS — Z1231 Encounter for screening mammogram for malignant neoplasm of breast: Secondary | ICD-10-CM | POA: Diagnosis not present

## 2022-06-17 LAB — HM MAMMOGRAPHY

## 2022-06-20 ENCOUNTER — Encounter: Payer: Self-pay | Admitting: Family Medicine

## 2022-07-14 ENCOUNTER — Ambulatory Visit: Payer: Medicare Other | Admitting: Family Medicine

## 2022-07-20 DIAGNOSIS — H353211 Exudative age-related macular degeneration, right eye, with active choroidal neovascularization: Secondary | ICD-10-CM | POA: Diagnosis not present

## 2022-08-24 DIAGNOSIS — H353211 Exudative age-related macular degeneration, right eye, with active choroidal neovascularization: Secondary | ICD-10-CM | POA: Diagnosis not present

## 2022-09-01 NOTE — Patient Instructions (Addendum)
Below is our plan:  We will continue to monitor. We can consider Aricept (donepezil) or Namenda (mematine) in the future, if you wish.   Please schedule follow up with Shelly Pearson Phone: [phone]  Please make sure you are staying well hydrated. I recommend 50-60 ounces daily. Well balanced diet and regular exercise encouraged. Consistent sleep schedule with 6-8 hours recommended.   Please continue follow up with care team as directed.   Follow up with me in 6 months  You may receive a survey regarding today's visit. I encourage you to leave honest feed back as I do use this information to improve patient care. Thank you for seeing me today!   Management of Memory Problems   There are some general things you can do to help manage your memory problems.  Your memory may not in fact recover, but by using techniques and strategies you will be able to manage your memory difficulties better.   1)  Establish a routine. Try to establish and then stick to a regular routine.  By doing this, you will get used to what to expect and you will reduce the need to rely on your memory.  Also, try to do things at the same time of day, such as taking your medication or checking your calendar first thing in the morning. Think about think that you can do as a part of a regular routine and make a list.  Then enter them into a daily planner to remind you.  This will help you establish a routine.   2)  Organize your environment. Organize your environment so that it is uncluttered.  Decrease visual stimulation.  Place everyday items such as keys or cell phone in the same place every day (ie.  Basket next to front door) Use post it notes with a brief message to yourself (ie. Turn off light, lock the door) Use labels to indicate where things go (ie. Which cupboards are for food, dishes, etc.) Keep a notepad and pen by the telephone to take messages   3)  Memory Aids A diary or journal/notebook/daily  planner Making a list (shopping list, chore list, to do list that needs to be done) Using an alarm as a reminder (kitchen timer or cell phone alarm) Using cell phone to store information (Notes, Calendar, Reminders) Calendar/White board placed in a prominent position Post-it notes   In order for memory aids to be useful, you need to have good habits.  It's no good remembering to make a note in your journal if you don't remember to look in it.  Try setting aside a certain time of day to look in journal.   4)  Improving mood and managing fatigue. There may be other factors that contribute to memory difficulties.  Factors, such as anxiety, depression and tiredness can affect memory. Regular gentle exercise can help improve your mood and give you more energy. Exercise: there are short videos created by the [employer] on Health specially for older adults: https://bit.ly/2I30q97.  Mediterranean diet: which emphasizes fruits, vegetables, whole grains, legumes, fish, and other seafood; unsaturated fats such as olive oils; and low amounts of red meat, eggs, and sweets. A variation of this, called MIND ([place] Intervention for Neurodegenerative Delay) incorporates the DASH (Dietary Approaches to Stop Hypertension) diet, which has been shown to lower high blood pressure, a risk factor for Alzheimer's disease. More information at: RepublicForum.gl.  Aerobic exercise that improve heart health is also good for the mind.  National  Institute on Aging have short videos for exercises that you can do at home: GoldCloset.com.ee Simple relaxation techniques may help relieve symptoms of anxiety Try to get back to completing activities or hobbies you enjoyed doing in the past. Learn to pace yourself through activities to decrease fatigue. Find out about some local support groups where you can share experiences with  others. Try and achieve 7-8 hours of sleep at night.

## 2022-09-01 NOTE — Progress Notes (Signed)
Chief Complaint  Patient presents with   Follow-up    Pt in room 2 here for memory follow up. Pt states no falls, right foot toes feel cold. MOCA:25    HISTORY OF PRESENT ILLNESS:  09/05/22 ALL:  Shelly Pearson returns for follow up for memory loss, gait instability and paraesthesias of hands and feet. Paresthesias are stable. No falls. Gait is stable.   She completed formal cognitive evaluation with Jenna Renfew. Results showed subtle indications of mild memory decline particularly evident in her delayed recall compared to immediate recall. Spontaneous recall showed slight deficit. Slight reduction in executive skills particularly in complex attention, fluency, and abstract problem solving. Deficits could be contributed to sleep deficiency and anxiety, both noted with clinical observation, or could represent early signs of neurodegenerative disease.   She was scheduled to follow up with Dr Rockey Situ in January but had to cancel due to the loss of her sister and several deaths in her church. She continues to note difficulty with word finding and recall. She feels symptoms are stable. No significant worsening. She does have significant family history of dementia.    01/11/2022 ALL: Shelly Pearson is a 82 y.o. female here today for follow up for memory loss, gait instability and paresthesias in hands and feet. She was seen in consult with Dr Krista Blue 07/2021. MOCA 24/30. MRI brain and thoracic spine unremarkable.   She reports feeling that there is a rock at the base of her right toes. It can be painful. She wears tennis shoes for extra support. She also has intermittent numbness of toes bilaterally. She has stiffness in feet when waking. She has chronic lumbar back pain. No radicular symptoms. MRI showed degenerative disease without nerve root compression. No recent falls.   She feels memory is fairly stable. She does continue to have difficulty with short term memory. She lives with her husband. She is  able to maintain home. Husband worked as an Optometrist and has always paid bills. She performs ADLs independently. She drives without difficulty but does mention using GPS. She has a significant family history of dementia with grandmother and two aunts. She walks about an hour every day.    HISTORY (copied from Dr Rhea Belton previous note)  Shelly Pearson is a 82 year old female, seen in request by her primary care physician Dr.  Arlester Marker for evaluation of gait abnormality, was seen by Dr. Jannifer Franklin since September 2021   I reviewed and summarized the referring note.PMHX HLD HTN Hypothyrodism   She reported many falls of the past few years, broke her ankle, she has now moved to be closer to her daughter since January 2020, due to increased difficulty living alone,  She now still independent, but has learned to be careful, she is sometimes noticed intermittent bilateral hands and the toes paresthesia, dropped things from her hands, tightness difficulty moving her legs  She still walks regularly, 3 miles each day, but has to be careful, moves slowly She was seen by Dr. Jannifer Franklin in the past,   I personally reviewed MRI lumbar in March 2022, mild degenerative disease, no significant canal stenosis, moderate severe left lateral recess stenosis at L4-5, no significant canal stenosis.  MRI of cervical spine, multilevel degenerative changes, no nerve root compression, no significant canal stenosis  EMG nerve conduction study in March 2022 showed no evidence of large fiber peripheral neuropathy   She also complains of mild memory loss, MoCA examination 24/30 today   She reported a long  history of urinary urgency, incontinence,   REVIEW OF SYSTEMS: Out of a complete 14 system review of symptoms, the patient complains only of the following symptoms, right foot pain, short term memory loss and all other reviewed systems are negative.   ALLERGIES: Allergies  Allergen Reactions   Adhesive  [Tape]  Other (See Comments)     HOME MEDICATIONS: Outpatient Medications Prior to Visit  Medication Sig Dispense Refill   Aflibercept (EYLEA IO) Inject into the eye. Monthly eye injections since 12/24     atorvastatin (LIPITOR) 20 MG tablet Take 1 tablet (20 mg total) by mouth daily. 90 tablet 3   BIOTIN PO 1 capsule     levothyroxine (SYNTHROID) 50 MCG tablet TAKE 1 TABLET DAILY BEFORE BREAKFAST 90 tablet 3   losartan (COZAAR) 25 MG tablet Take 1 tablet (25 mg total) by mouth daily. 90 tablet 3   Multiple Vitamins-Minerals (MATURE ADULT CENTURY PO)      trospium (SANCTURA) 20 MG tablet Take 1 tablet (20 mg total) by mouth at bedtime. 90 tablet 3   No facility-administered medications prior to visit.     PAST MEDICAL HISTORY: Past Medical History:  Diagnosis Date   B12 deficiency 03/02/2020   GERD (gastroesophageal reflux disease)    Hypertension    Seasonal allergies    Thyroid disease    Urge incontinence of urine 12/10/2020   Urgency incontinence      PAST SURGICAL HISTORY: Past Surgical History:  Procedure Laterality Date   ABDOMINAL HYSTERECTOMY     CESAREAN SECTION     CYSTOCELE REPAIR     ROTATOR CUFF REPAIR Right    2018     FAMILY HISTORY: Family History  Problem Relation Age of Onset   Stroke Mother    Cancer Father    Cancer Sister    Cancer Brother      SOCIAL HISTORY: Social History   Socioeconomic History   Marital status: Married    Spouse name: Not on file   Number of children: Not on file   Years of education: Not on file   Highest education level: Not on file  Occupational History   Occupation: Retired  Tobacco Use   Smoking status: Never   Smokeless tobacco: Never  Vaping Use   Vaping Use: Never used  Substance and Sexual Activity   Alcohol use: Yes    Comment: very rare    Drug use: No   Sexual activity: Not Currently  Other Topics Concern   Not on file  Social History Narrative   Lives with husband   Right handed   1 cup tea  every morning   Social Determinants of Health   Financial Resource Strain: Low Risk  (04/19/2022)   Overall Financial Resource Strain (CARDIA)    Difficulty of Paying Living Expenses: Not hard at all  Food Insecurity: No Food Insecurity (04/19/2022)   Hunger Vital Sign    Worried About Running Out of Food in the Last Year: Never true    Ran Out of Food in the Last Year: Never true  Transportation Needs: No Transportation Needs (03/23/2021)   PRAPARE - Hydrologist (Medical): No    Lack of Transportation (Non-Medical): No  Physical Activity: Insufficiently Active (04/19/2022)   Exercise Vital Sign    Days of Exercise per Week: 3 days    Minutes of Exercise per Session: 30 min  Stress: No Stress Concern Present (04/19/2022)   [place] -  Occupational Stress Questionnaire    Feeling of Stress : Not at all  Social Connections: Socially Integrated (04/19/2022)   Social Connection and Isolation Panel [NHANES]    Frequency of Communication with Friends and Family: More than three times a week    Frequency of Social Gatherings with Friends and Family: More than three times a week    Attends Religious Services: More than 4 times per year    Active Member of [employer] or Organizations: Yes    Attends Music therapist: More than 4 times per year    Marital Status: Married  Human resources officer Violence: Not At Risk (04/19/2022)   Humiliation, Afraid, Rape, and Kick questionnaire    Fear of Current or Ex-Partner: No    Emotionally Abused: No    Physically Abused: No    Sexually Abused: No     PHYSICAL EXAM  Vitals:   09/05/22 1026  BP: 121/65  Pulse: 79  Weight: 131 lb 1.6 oz (59.5 kg)  Height: '5\' 4"'$  (1.626 m)    Body mass index is 22.5 kg/m.  Generalized: Well developed, in no acute distress  Cardiology: normal rate and rhythm, no murmur auscultated  Respiratory: clear to auscultation bilaterally     Neurological examination  Mentation: Alert oriented to time, place, history taking. Follows all commands speech and language fluent Cranial nerve II-XII: Pupils were equal round reactive to light. Extraocular movements were full, visual field were full on confrontational test. Facial sensation and strength were normal. Head turning and shoulder shrug  were normal and symmetric. Motor: The motor testing reveals 5 over 5 strength of all 4 extremities. Good symmetric motor tone is noted throughout.  Sensory: Sensory testing is intact to soft touch on all 4 extremities. No evidence of extinction is noted.  Gait and station: Gait is normal.  Reflexes: Deep tendon reflexes are mildly brisk in uppers but symmetric bilaterally.    DIAGNOSTIC DATA (LABS, IMAGING, TESTING) - I reviewed patient records, labs, notes, testing and imaging myself where available.  Lab Results  Component Value Date   WBC 5.1 03/02/2020   HGB 13.4 03/02/2020   HCT 40.3 03/02/2020   MCV 92.1 03/02/2020   PLT 205.0 03/02/2020      Component Value Date/Time   NA 140 09/01/2020 1122   K 3.9 09/01/2020 1122   CL 106 09/01/2020 1122   CO2 30 09/01/2020 1122   GLUCOSE 99 09/01/2020 1122   BUN 19 09/01/2020 1122   CREATININE 0.68 09/01/2020 1122   CALCIUM 9.6 09/01/2020 1122   PROT 6.4 03/02/2020 1443   ALBUMIN 4.3 03/02/2020 1443   AST 17 03/02/2020 1443   ALT 17 03/02/2020 1443   ALKPHOS 67 03/02/2020 1443   BILITOT 0.6 03/02/2020 1443   Lab Results  Component Value Date   CHOL 166 09/16/2021   HDL 76.90 09/16/2021   LDLCALC 63 09/16/2021   LDLDIRECT 70.0 03/02/2020   TRIG 132.0 09/16/2021   CHOLHDL 2 09/16/2021   No results found for: "HGBA1C" Lab Results  Component Value Date   [id number] 09/01/2020   Lab Results  Component Value Date   TSH 1.18 03/22/2022        No data to display              09/05/2022   10:34 AM 01/11/2022   10:50 AM 07/17/2021   12:00 PM  Montreal Cognitive  Assessment   Visuospatial/ Executive (0/5) '5 5 5  '$ Naming (0/3) 3 3 3  Attention: Read list of digits (0/2) '2 2 2  '$ Attention: Read list of letters (0/1) '1 1 1  '$ Attention: Serial 7 subtraction starting at 100 (0/3) '3 3 2  '$ Language: Repeat phrase (0/2) '2 1 2  '$ Language : Fluency (0/1) '1 1 1  '$ Abstraction (0/2) '2 1 2  '$ Delayed Recall (0/5) 0 2 0  Orientation (0/6) '6 5 6  '$ Total '25 24 24     '$ ASSESSMENT AND PLAN  82 y.o. year old female  has a past medical history of B12 deficiency (03/02/2020), GERD (gastroesophageal reflux disease), Hypertension, Seasonal allergies, Thyroid disease, Urge incontinence of urine (12/10/2020), and Urgency incontinence. here with    Mild cognitive impairment  Paresthesia of both feet  Gait abnormality  Shelly Pearson reports symptoms are fairly stable from last visit. MOCA 25/30, previously 24/30. MRI brian showed age related atrophy. Formal neurocog testing concerning for deficits related to anxiety and insomnia but could not rule out neurodegenerative concerns. She has significant family history of dementia. We have discussed trial of memory medicaitons but she is hesitant at this time. She will review educational material and discuss with family. She will continue regular exercise. Memory compensation strategies reviewed. Healthy lifestyle habits encouraged. She will follow up with PCP as directed. She will return to see me in 6 months, sooner if needed. She verbalizes understanding and agreement with this plan.   No orders of the defined types were placed in this encounter.    No orders of the defined types were placed in this encounter.   I spent 30 minutes of face-to-face and non-face-to-face time with patient.  This included previsit chart review, lab review, study review, order entry, electronic health record documentation, patient education.   Debbora Presto, MSN, FNP-C 09/05/2022, 12:42 PM  [facility] [address] [place], [place] [address] [phone]

## 2022-09-05 ENCOUNTER — Ambulatory Visit (INDEPENDENT_AMBULATORY_CARE_PROVIDER_SITE_OTHER): Payer: Medicare Other | Admitting: Family Medicine

## 2022-09-05 ENCOUNTER — Encounter: Payer: Self-pay | Admitting: Family Medicine

## 2022-09-05 VITALS — BP 121/65 | HR 79 | Ht 64.0 in | Wt 131.1 lb

## 2022-09-05 DIAGNOSIS — R269 Unspecified abnormalities of gait and mobility: Secondary | ICD-10-CM | POA: Diagnosis not present

## 2022-09-05 DIAGNOSIS — R202 Paresthesia of skin: Secondary | ICD-10-CM | POA: Diagnosis not present

## 2022-09-05 DIAGNOSIS — G3184 Mild cognitive impairment, so stated: Secondary | ICD-10-CM | POA: Diagnosis not present

## 2022-09-28 DIAGNOSIS — H353211 Exudative age-related macular degeneration, right eye, with active choroidal neovascularization: Secondary | ICD-10-CM | POA: Diagnosis not present

## 2022-10-07 ENCOUNTER — Encounter: Payer: Self-pay | Admitting: Family Medicine

## 2022-10-07 ENCOUNTER — Telehealth: Payer: Self-pay | Admitting: Family Medicine

## 2022-10-07 ENCOUNTER — Ambulatory Visit (INDEPENDENT_AMBULATORY_CARE_PROVIDER_SITE_OTHER): Payer: Medicare Other | Admitting: Family Medicine

## 2022-10-07 VITALS — BP 118/64 | HR 74 | Temp 98.1°F | Ht 64.0 in | Wt 131.0 lb

## 2022-10-07 DIAGNOSIS — B351 Tinea unguium: Secondary | ICD-10-CM | POA: Diagnosis not present

## 2022-10-07 MED ORDER — TAVABOROLE 5 % EX SOLN: CUTANEOUS | 3 refills | Status: DC

## 2022-10-07 NOTE — Telephone Encounter (Signed)
Caller Name: Teather Call back phone #: [phone]  Reason for Call: pt called stating the 2nd med sent in was not covered by insurance and is over $1000. Per pt, do not worry about this tonight. Have a good weekend and follow up with her next week.   Tavaborole 5 % SOLN

## 2022-10-07 NOTE — Assessment & Plan Note (Signed)
Appears to be fungal involvement of the left 1st-3rd nails. I will prescribe tavaborole to be applied daily. If not showing improvement over the coming weeks, she could consider a referral to podiatry.

## 2022-10-07 NOTE — Progress Notes (Signed)
[facility] [address] [place] [id number] Dept: [phone] Dept Fax: [phone]  Office Visit  Subjective:    Patient ID: Shelly Pearson, female    DOB: [birth year], 82 y.o..   MRN: [id number]  Chief Complaint  Patient presents with   Nail Problem    C/o having big toe lifting on LT foot.    History of Present Illness:  Patient is in today with concerns for her left great toe nail separating from the nail bed. She denies any discomfort. She had googled her condition. It noted this could occur either form toenail fungus or cancer. This made her worry.  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   Metatarsalgia of both feet 03/02/2020   Hand arthritis 03/02/2020   Arthritis of carpometacarpal (CMC) joint of both thumbs 09/19/2019   Overactive bladder 09/19/2019   Sciatica 04/26/2019   Paresthesia of both feet 02/04/2019   Eye twitch 02/04/2019   Breast pain, left 05/17/2018   Epidermal cyst 04/17/2018   Allergic rhinitis 06/23/2015   Eczema 06/23/2015   Gastro-esophageal reflux disease without esophagitis 06/23/2015   Essential hypertension 06/23/2015   Hypothyroidism 06/23/2015   Past Surgical History:  Procedure Laterality Date   ABDOMINAL HYSTERECTOMY     CESAREAN SECTION     CYSTOCELE REPAIR     ROTATOR CUFF REPAIR Right    2018   Family History  Problem Relation Age of Onset   Stroke Mother    Cancer Father     Cancer Sister    Cancer Brother    Outpatient Medications Prior to Visit  Medication Sig Dispense Refill   Aflibercept (EYLEA IO) Inject into the eye. Monthly eye injections since 12/24     atorvastatin (LIPITOR) 20 MG tablet Take 1 tablet (20 mg total) by mouth daily. 90 tablet 3   BIOTIN PO 1 capsule     levothyroxine (SYNTHROID) 50 MCG tablet TAKE 1 TABLET DAILY BEFORE BREAKFAST 90 tablet 3   losartan (COZAAR) 25 MG tablet Take 1 tablet (25 mg total) by mouth daily. 90 tablet 3   Multiple Vitamins-Minerals (MATURE ADULT CENTURY PO)      trospium (SANCTURA) 20 MG tablet Take 1 tablet (20 mg total) by mouth at bedtime. 90 tablet 3   No facility-administered medications prior to visit.   Allergies  Allergen Reactions   Adhesive  [Tape] Other (See Comments)     Objective:   Today's Vitals   10/07/22 1535  BP: 118/64  Pulse: 74  Temp: 98.1 F (36.7 C)  TempSrc: Temporal  SpO2: 98%  Weight: 131 lb (59.4 kg)  Height: 5\' 4"  (1.626 m)   Body mass index is 22.49 kg/m.   General: Well developed, well nourished. No acute distress. Left foot: The great toe nail is separated from the nail bed along the lateral margin. There is a splitting of the layers   of the nail and some debris under the nail. The 2nd and 3rd toe nails are thickened and slightly  discolored. Psych: Alert and oriented. Normal mood and affect.  Health Maintenance Due  Topic Date Due   Hepatitis C Screening  Never done   Zoster Vaccines- Shingrix (2 of 2) 05/06/2022     Assessment & Plan:   Problem List Items Addressed This Visit       Musculoskeletal and Integument   Onychomycosis - Primary    Appears to be fungal involvement of the left 1st-3rd nails. I will prescribe tavaborole to be applied daily. If not showing improvement over the coming weeks, she could consider a referral to podiatry.      Relevant Medications   Tavaborole 5 % SOLN    No follow-ups on file.   Loyola Mast, MD

## 2022-10-10 NOTE — Telephone Encounter (Signed)
Received the PA for the Tavaborole solution. Is there something else that can be prescribed for her? Please review and advise.  Thanks. Dm/cma

## 2022-10-11 NOTE — Telephone Encounter (Signed)
Patient notified VIA phone.  Advised to call back if she hasn't heard form them in a week. Dm/cma

## 2022-10-12 ENCOUNTER — Other Ambulatory Visit: Payer: Self-pay | Admitting: Family Medicine

## 2022-10-12 DIAGNOSIS — N3281 Overactive bladder: Secondary | ICD-10-CM

## 2022-10-12 DIAGNOSIS — I10 Essential (primary) hypertension: Secondary | ICD-10-CM

## 2022-10-12 DIAGNOSIS — E039 Hypothyroidism, unspecified: Secondary | ICD-10-CM

## 2022-10-12 DIAGNOSIS — E782 Mixed hyperlipidemia: Secondary | ICD-10-CM

## 2022-10-12 MED ORDER — LEVOTHYROXINE SODIUM 50 MCG PO TABS: 50.0000 ug | ORAL_TABLET | Freq: Every day | ORAL | 3 refills | Status: DC

## 2022-10-12 MED ORDER — ATORVASTATIN CALCIUM 20 MG PO TABS: 20.0000 mg | ORAL_TABLET | Freq: Every day | ORAL | 3 refills | Status: DC

## 2022-10-12 MED ORDER — TROSPIUM CHLORIDE 20 MG PO TABS: 20.0000 mg | ORAL_TABLET | Freq: Every day | ORAL | 3 refills | Status: DC

## 2022-10-12 MED ORDER — LOSARTAN POTASSIUM 25 MG PO TABS: 25.0000 mg | ORAL_TABLET | Freq: Every day | ORAL | 3 refills | Status: DC

## 2022-10-12 NOTE — Telephone Encounter (Signed)
Please review and advise. Thanks. Dm/cma  

## 2022-10-12 NOTE — Telephone Encounter (Signed)
Prescription Request  10/12/2022  LOV: 10/07/2022  What is the name of the medication or equipment? atorvastatin (LIPITOR) 20 MG tablet [[id number]] , levothyroxine (SYNTHROID) 50 MCG tablet [[id number]] , trospium (SANCTURA) 20 MG tablet [[id number]] and losartan (COZAAR) 25 MG tablet [[id number]]   Have you contacted your pharmacy to request a refill? Yes   Which pharmacy would you like this sent to?  [facility] and phone # is [phone]   Patient notified that their request is being sent to the clinical staff for review and that they should receive a response within 2 business days.   Please advise at Mobile [phone] (mobile)   Pt said she needs 90 day supply for each medication

## 2022-10-13 NOTE — Telephone Encounter (Signed)
Lft detailed VM that RX's were sent to the pharmacy. Dm/cma ? ?

## 2022-10-17 ENCOUNTER — Encounter: Payer: Self-pay | Admitting: *Deleted

## 2022-10-26 DIAGNOSIS — H353211 Exudative age-related macular degeneration, right eye, with active choroidal neovascularization: Secondary | ICD-10-CM | POA: Diagnosis not present

## 2022-11-07 ENCOUNTER — Encounter: Payer: Self-pay | Admitting: Podiatry

## 2022-11-07 ENCOUNTER — Ambulatory Visit (INDEPENDENT_AMBULATORY_CARE_PROVIDER_SITE_OTHER): Payer: Medicare Other | Admitting: Podiatry

## 2022-11-07 DIAGNOSIS — B351 Tinea unguium: Secondary | ICD-10-CM

## 2022-11-07 NOTE — Progress Notes (Signed)
Subjective:   Patient ID: Shelly Pearson, female   DOB: 83 y.o.   MRN: [id number]   HPI Patient presents stating she is getting some discoloration on her left big toe that she is concerned about and the possibility that it could be a fungus that may be spreading does not remember injury that she can remember at this time.  Patient does not smoke tries to be active   Review of Systems  All other systems reviewed and are negative.       Objective:  Physical Exam Vitals and nursing note reviewed.  Constitutional:      Appearance: She is well-developed.  Pulmonary:     Effort: Pulmonary effort is normal.  Musculoskeletal:        General: Normal range of motion.  Skin:    General: Skin is warm.  Neurological:     Mental Status: She is alert.     Neurovascular status found to be intact muscle strength was found to be adequate range of motion adequate with patient noted to have discoloration on the lateral side of the left toenail that is localized to this area with other nails that are slightly thickened but not deformed     Assessment:  Combination of a possible mycotic infection of the nailbed versus trauma or combination explaining     Plan:  H&P reviewed condition trying to educate as best as possible do not think this is contagious to others and we will start her on formula 7 as a simple solution and may require other treatments if the symptoms get worse

## 2022-11-15 ENCOUNTER — Ambulatory Visit: Payer: Medicare Other | Admitting: Family Medicine

## 2022-12-30 DIAGNOSIS — H353211 Exudative age-related macular degeneration, right eye, with active choroidal neovascularization: Secondary | ICD-10-CM | POA: Diagnosis not present

## 2023-01-30 DIAGNOSIS — H353211 Exudative age-related macular degeneration, right eye, with active choroidal neovascularization: Secondary | ICD-10-CM | POA: Diagnosis not present

## 2023-01-30 DIAGNOSIS — H353121 Nonexudative age-related macular degeneration, left eye, early dry stage: Secondary | ICD-10-CM | POA: Diagnosis not present

## 2023-01-30 DIAGNOSIS — H25813 Combined forms of age-related cataract, bilateral: Secondary | ICD-10-CM | POA: Diagnosis not present

## 2023-01-30 DIAGNOSIS — H35363 Drusen (degenerative) of macula, bilateral: Secondary | ICD-10-CM | POA: Diagnosis not present

## 2023-02-17 ENCOUNTER — Ambulatory Visit (INDEPENDENT_AMBULATORY_CARE_PROVIDER_SITE_OTHER): Payer: Medicare Other | Admitting: Family Medicine

## 2023-02-17 ENCOUNTER — Telehealth: Payer: Self-pay | Admitting: Family Medicine

## 2023-02-17 VITALS — BP 116/70 | HR 80 | Temp 98.5°F | Ht 64.0 in | Wt 134.6 lb

## 2023-02-17 DIAGNOSIS — H60501 Unspecified acute noninfective otitis externa, right ear: Secondary | ICD-10-CM | POA: Diagnosis not present

## 2023-02-17 MED ORDER — NEOMYCIN-POLYMYXIN-HC 3.5-10000-1 OT SOLN
3.0000 [drp] | Freq: Four times a day (QID) | OTIC | 0 refills | Status: DC
Start: 2023-02-17 — End: 2023-06-07

## 2023-02-17 NOTE — Telephone Encounter (Signed)
error 

## 2023-02-17 NOTE — Progress Notes (Signed)
[facility]ARE [facility] [address] [place] [address] Dept: [phone] Dept Fax: [phone]  Office Visit  Subjective:    Patient ID: Shelly Pearson, female    DOB: Mar 17, 1941, 82 y.o..   MRN: [id number]  Chief Complaint  Patient presents with   Ear Pain    C/o having RT ear pain and feels clogged x 1 week.     History of Present Illness:  Patient is in today complaining of a 2-day history of right ear pain and a sense of fullness or muffled hearing. She notes it has been painful to lie with that ear touching the pillow. She has a history of itching in her ears. She is leaving in 2 weeks for a flight to [place], so was concerned about getting this dealt with ahead of her trip.  Past Medical History: Patient Active Problem List   Diagnosis Date Noted   Onychomycosis 10/07/2022   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), wet (HCC) 05/24/2022   Decreased grip strength of right hand 09/16/2021   Gait disturbance 07/12/2021   Mild cognitive impairment 07/12/2021   Thyroid goiter 12/10/2020   Mixed hyperlipidemia 12/10/2020   Degenerative cervical spinal stenosis, mild C4-C5 and C5-C6 12/10/2020   Lumbar degenerative disc disease, L4-L5 with left lateral recess stenosis 12/10/2020   Personal history of fall 09/01/2020   Congenital cavus deformity of both feet 03/02/2020   Metatarsalgia of both feet 03/02/2020   Hand arthritis 03/02/2020   Arthritis of carpometacarpal (CMC) joint of both thumbs 09/19/2019   Overactive bladder 09/19/2019   Sciatica 04/26/2019   Paresthesia of both feet 02/04/2019   Epidermal cyst 04/17/2018   Allergic rhinitis 06/23/2015   Eczema 06/23/2015   Gastro-esophageal reflux disease without esophagitis 06/23/2015   Essential hypertension 06/23/2015   Hypothyroidism 06/23/2015   Past Surgical History:  Procedure Laterality Date   ABDOMINAL HYSTERECTOMY     CESAREAN SECTION     CYSTOCELE REPAIR      ROTATOR CUFF REPAIR Right    2018   Family History  Problem Relation Age of Onset   Stroke Mother    Cancer Father    Cancer Sister    Cancer Brother    Outpatient Medications Prior to Visit  Medication Sig Dispense Refill   atorvastatin (LIPITOR) 20 MG tablet Take 1 tablet (20 mg total) by mouth daily. 90 tablet 3   BIOTIN PO 1 capsule     levothyroxine (SYNTHROID) 50 MCG tablet Take 1 tablet (50 mcg total) by mouth daily before breakfast. 90 tablet 3   losartan (COZAAR) 25 MG tablet Take 1 tablet (25 mg total) by mouth daily. 90 tablet 3   Multiple Vitamins-Minerals (MATURE ADULT CENTURY PO)      trospium (SANCTURA) 20 MG tablet Take 1 tablet (20 mg total) by mouth at bedtime. 90 tablet 3   Aflibercept (EYLEA IO) Inject into the eye. Monthly eye injections since 12/24     Tavaborole 5 % SOLN Apply to the surface and along the distal nail margin of affected nails daily. 10 mL 3   No facility-administered medications prior to visit.   Allergies  Allergen Reactions   Adhesive  [Tape] Other (See Comments)     Objective:   Today's Vitals   02/17/23 1021  BP: 116/70  Pulse: 80  Temp: 98.5 F (36.9 C)  TempSrc: Temporal  SpO2: 99%  Weight: 134 lb 9.6 oz (61.1 kg)  Height: 5\' 4"  (1.626 m)   Body mass index  is 23.1 kg/m.   General: Well developed, well nourished. No acute distress. Ear: There is some mild redness around the external auditory meatus. There is mild discomfort with tragal   manipulation. The EAC is swollen and tender. The drum is difficult to see, but appears red and thickened. The left EAC   and TM are normal. Psych: Alert and oriented. Normal mood and affect.  Health Maintenance Due  Topic Date Due   Hepatitis C Screening  Never done   Zoster Vaccines- Shingrix (2 of 2) 05/06/2022     Assessment & Plan:   Problem List Items Addressed This Visit   None Visit Diagnoses     Acute otitis externa of right ear, unspecified type    -  Primary   Exam  is c/w external otitis, likely secondary to seborrhea. I will prescribe Cortisporin drops. Discussed use of HC cream on the outer ear for itching.   Relevant Medications   neomycin-polymyxin-hydrocortisone (CORTISPORIN) OTIC solution      Return if symptoms worsen or fail to improve.   Loyola Mast, MD

## 2023-02-20 DIAGNOSIS — H353211 Exudative age-related macular degeneration, right eye, with active choroidal neovascularization: Secondary | ICD-10-CM | POA: Diagnosis not present

## 2023-03-16 ENCOUNTER — Ambulatory Visit: Payer: Medicare Other | Admitting: Family Medicine

## 2023-03-30 DIAGNOSIS — Z23 Encounter for immunization: Secondary | ICD-10-CM | POA: Diagnosis not present

## 2023-04-07 ENCOUNTER — Encounter: Payer: Self-pay | Admitting: Family Medicine

## 2023-04-07 ENCOUNTER — Telehealth: Payer: Self-pay

## 2023-04-07 ENCOUNTER — Ambulatory Visit (INDEPENDENT_AMBULATORY_CARE_PROVIDER_SITE_OTHER): Payer: Medicare Other | Admitting: Family Medicine

## 2023-04-07 VITALS — BP 114/68 | HR 77 | Temp 98.4°F | Ht 64.0 in | Wt 132.2 lb

## 2023-04-07 DIAGNOSIS — E782 Mixed hyperlipidemia: Secondary | ICD-10-CM

## 2023-04-07 DIAGNOSIS — L309 Dermatitis, unspecified: Secondary | ICD-10-CM | POA: Diagnosis not present

## 2023-04-07 DIAGNOSIS — I10 Essential (primary) hypertension: Secondary | ICD-10-CM | POA: Diagnosis not present

## 2023-04-07 DIAGNOSIS — E039 Hypothyroidism, unspecified: Secondary | ICD-10-CM | POA: Diagnosis not present

## 2023-04-07 LAB — LIPID PANEL
Cholesterol: 171 mg/dL (ref 0–200)
HDL: 69.7 mg/dL (ref >39.00)
LDL Cholesterol: 79 mg/dL (ref 0–99)
NonHDL: 101.39
Total CHOL/HDL Ratio: 2
Triglycerides: 111 mg/dL (ref 0.0–149.0)
VLDL: 22.2 mg/dL (ref 0.0–40.0)

## 2023-04-07 LAB — BASIC METABOLIC PANEL
BUN: 13 mg/dL (ref 6–23)
CO2: 29 meq/L (ref 19–32)
Calcium: 9.6 mg/dL (ref 8.4–10.5)
Chloride: 106 meq/L (ref 96–112)
Creatinine, Ser: 0.75 mg/dL (ref 0.40–1.20)
GFR: 75.47 mL/min (ref >60.00)
Glucose, Bld: 102 mg/dL — ABNORMAL HIGH (ref 70–99)
Potassium: 3.9 meq/L (ref 3.5–5.1)
Sodium: 144 meq/L (ref 135–145)

## 2023-04-07 LAB — TSH: TSH: 0.82 u[IU]/mL (ref 0.35–5.50)

## 2023-04-07 MED ORDER — TRIAMCINOLONE ACETONIDE 0.1 % EX CREA
1.0000 | TOPICAL_CREAM | Freq: Two times a day (BID) | CUTANEOUS | 2 refills | Status: DC
Start: 2023-04-07 — End: 2023-06-07

## 2023-04-07 NOTE — Assessment & Plan Note (Signed)
Recommend we try a more potent steroid cream.

## 2023-04-07 NOTE — Assessment & Plan Note (Signed)
Blood pressure is in good control. We discussed her doing a trial off of her losartan and monitoring her BP. If this remains < 130/80, we would discontinue this medicine.

## 2023-04-07 NOTE — Patient Outreach (Signed)
  Care Coordination   In Person Provider Office Visit Note   04/07/2023 Name: Shelly Pearson MRN: [id number] DOB: 12/08/1940  Shelly Pearson is a 82 y.o. year old female who sees Rudd, Bertram Millard, MD for primary care. I engaged with Carolyne Fiscal in the providers office today.  What matters to the patients health and wellness today?  none    Goals Addressed             This Visit's Progress    COMPLETED: Care Coordination Activities-No follow up required       Care Coordination Interventions: Advised patient to Annual Wellness exam. Discussed [facility] services and support. Assessed SDOH. Advised to discuss with primary care physician if services needed in the future.          SDOH assessments and interventions completed:  Yes     Care Coordination Interventions:  Yes, provided   Follow up plan: No further intervention required.   Encounter Outcome:  Patient Visit Completed   Bary Leriche, RN, MSN [facility]  [facility], [facility] Management Community Coordinator Direct Dial: [phone]  Fax: [phone] Website: [email or web address]

## 2023-04-07 NOTE — Patient Instructions (Signed)
Visit Information  Thank you for taking time to visit with me today. Please don't hesitate to contact me if I can be of assistance to you.   Following are the goals we discussed today:   Goals Addressed             This Visit's Progress    COMPLETED: Care Coordination Activities-No follow up required       Care Coordination Interventions: Advised patient to Annual Wellness exam. Discussed [facility] services and support. Assessed SDOH. Advised to discuss with primary care physician if services needed in the future.          If you are experiencing a Mental Health or Behavioral Health Crisis or need someone to talk to, please call the Suicide and Crisis Lifeline: 988   Patient verbalizes understanding of instructions and care plan provided today and agrees to view in MyChart. Active MyChart status and patient understanding of how to access instructions and care plan via MyChart confirmed with patient.     The patient has been provided with contact information for the care management team and has been advised to call with any health related questions or concerns.   Bary Leriche, RN, MSN [facility], [facility] Management Community Coordinator Direct Dial: [phone]  Fax: [phone] Website: [email or web address]

## 2023-04-07 NOTE — Assessment & Plan Note (Signed)
I will check annual lipids today. Continue atorvastatin 20 mg daily.

## 2023-04-07 NOTE — Assessment & Plan Note (Signed)
I will check her TSH today. Continue levothyroxine 50 mcg daily.

## 2023-04-07 NOTE — Progress Notes (Signed)
[facility] PR[facility] CARE [facility] [address] [place] [address] Dept: [phone] Dept Fax: [phone]  Chronic Care Office Visit  Subjective:    Patient ID: DEJAE BERNET, female    DOB: Feb 05, 1941, 82 y.o..   MRN: [id number]  Chief Complaint  Patient presents with   Annual Exam    Post Covid exposure    History of Present Illness:  Patient is in today for reassessment of chronic medical issues.  Ms. Baysinger has essential hypertensin. She is managed on losartan 25 mg daily. She notes this was identified many years ago and that she never has had high pressures since.  Ms. Vecchiarelli has a history of hyperlipidemia. She is managed on atorvastatin 20 mg daily.  Ms. Hink has a history of hypothyroidism. She is managed on levothyroxine.  Ms. Uzelac notes she ahs had some issues with an area of itchy skin on her right upper back/shoulder. She has tried using hydrocortisone cream, but this doesn't seem to help. She is also using a moisturizing lotion.  Past Medical History: Patient Active Problem List   Diagnosis Date Noted   Onychomycosis 10/07/2022   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), wet (HCC) 05/24/2022   Decreased grip strength of right hand 09/16/2021   Gait disturbance 07/12/2021   Mild cognitive impairment 07/12/2021   Thyroid goiter 12/10/2020   Mixed hyperlipidemia 12/10/2020   Degenerative cervical spinal stenosis, mild C4-C5 and C5-C6 12/10/2020   Lumbar degenerative disc disease, L4-L5 with left lateral recess stenosis 12/10/2020   Personal history of fall 09/01/2020   Congenital cavus deformity of both feet 03/02/2020   Metatarsalgia of both feet 03/02/2020   Hand arthritis 03/02/2020   Arthritis of carpometacarpal (CMC) joint of both thumbs 09/19/2019   Overactive bladder 09/19/2019   Sciatica 04/26/2019   Paresthesia of both feet 02/04/2019   Epidermal cyst 04/17/2018   Allergic rhinitis 06/23/2015    Eczema 06/23/2015   Gastro-esophageal reflux disease without esophagitis 06/23/2015   Essential hypertension 06/23/2015   Hypothyroidism 06/23/2015   Past Surgical History:  Procedure Laterality Date   ABDOMINAL HYSTERECTOMY     CESAREAN SECTION     CYSTOCELE REPAIR     ROTATOR CUFF REPAIR Right    2018   Family History  Problem Relation Age of Onset   Stroke Mother    Cancer Father    Cancer Sister    Cancer Brother    Outpatient Medications Prior to Visit  Medication Sig Dispense Refill   atorvastatin (LIPITOR) 20 MG tablet Take 1 tablet (20 mg total) by mouth daily. 90 tablet 3   BIOTIN PO 1 capsule     levothyroxine (SYNTHROID) 50 MCG tablet Take 1 tablet (50 mcg total) by mouth daily before breakfast. 90 tablet 3   losartan (COZAAR) 25 MG tablet Take 1 tablet (25 mg total) by mouth daily. 90 tablet 3   Multiple Vitamins-Minerals (MATURE ADULT CENTURY PO)      trospium (SANCTURA) 20 MG tablet Take 1 tablet (20 mg total) by mouth at bedtime. 90 tablet 3   neomycin-polymyxin-hydrocortisone (CORTISPORIN) OTIC solution Place 3 drops into the right ear 4 (four) times daily. 10 mL 0   No facility-administered medications prior to visit.   Allergies  Allergen Reactions   Adhesive  [Tape] Other (See Comments)   Objective:   Today's Vitals   04/07/23 1018  BP: 114/68  Pulse: 77  Temp: 98.4 F (36.9 C)  TempSrc: Temporal  SpO2: 99%  Weight: 132 lb 3.2  oz (60 kg)  Height: 5\' 4"  (1.626 m)   Body mass index is 22.69 kg/m.   General: Well developed, well nourished. No acute distress. HEENT: Normocephalic, non-traumatic. External ears normal. EAC and TMs normal bilaterally. PERRL,   EOMI. Conjunctiva clear. Nose clear without congestion or rhinorrhea. Mucous membranes moist.   Oropharynx clear. Good dentition. Neck: Supple. No lymphadenopathy. No thyromegaly. Lungs: Clear to auscultation bilaterally. No wheezing, rales or rhonchi. CV: RRR without murmurs or rubs. Pulses  2+ bilaterally. Abdomen: Soft, non-tender. Bowel sounds positive, normal pitch and frequency. No hepatosplenomegaly. No   rebound or guarding. Back: Straight. No CVA tenderness bilaterally. Extremities: Full ROM. No joint swelling or tenderness. No edema noted. Skin: Warm and dry. There is an area of thickening of the skin with a roughness over her right upper   back/shoulder. No obvious scaliness present. Psych: Alert and oriented. Normal mood and affect.  Health Maintenance Due  Topic Date Due   Hepatitis C Screening  Never done   Zoster Vaccines- Shingrix (2 of 2) 05/06/2022   Medicare Annual Wellness (AWV)  04/20/2023     Assessment & Plan:   Problem List Items Addressed This Visit       Cardiovascular and Mediastinum   Essential hypertension - Primary    Blood pressure is in good control. We discussed her doing a trial off of her losartan and monitoring her BP. If this remains < 130/80, we would discontinue this medicine.      Relevant Orders   Basic metabolic panel     Endocrine   Hypothyroidism    I will check her TSH today. Continue levothyroxine 50 mcg daily.      Relevant Orders   TSH     Musculoskeletal and Integument   Eczema    Recommend we try a more potent steroid cream.      Relevant Medications   triamcinolone cream (KENALOG) 0.1 %     Other   Mixed hyperlipidemia    I will check annual lipids today. Continue atorvastatin 20 mg daily.      Relevant Orders   Lipid panel    Return in about 6 months (around 10/06/2023) for Reassessment.   Loyola Mast, MD

## 2023-04-17 DIAGNOSIS — H353211 Exudative age-related macular degeneration, right eye, with active choroidal neovascularization: Secondary | ICD-10-CM | POA: Diagnosis not present

## 2023-05-27 DIAGNOSIS — Z23 Encounter for immunization: Secondary | ICD-10-CM | POA: Diagnosis not present

## 2023-06-05 ENCOUNTER — Emergency Department (HOSPITAL_BASED_OUTPATIENT_CLINIC_OR_DEPARTMENT_OTHER): Payer: Medicare Other

## 2023-06-05 ENCOUNTER — Emergency Department (HOSPITAL_BASED_OUTPATIENT_CLINIC_OR_DEPARTMENT_OTHER)
Admission: EM | Admit: 2023-06-05 | Discharge: 2023-06-05 | Disposition: A | Discharge status: Home / Self Care | Payer: Medicare Other | Attending: Emergency Medicine | Admitting: Emergency Medicine

## 2023-06-05 ENCOUNTER — Encounter (HOSPITAL_BASED_OUTPATIENT_CLINIC_OR_DEPARTMENT_OTHER): Payer: Self-pay | Admitting: Urology

## 2023-06-05 DIAGNOSIS — S82842A Displaced bimalleolar fracture of left lower leg, initial encounter for closed fracture: Secondary | ICD-10-CM | POA: Insufficient documentation

## 2023-06-05 DIAGNOSIS — X501XXA Overexertion from prolonged static or awkward postures, initial encounter: Secondary | ICD-10-CM | POA: Insufficient documentation

## 2023-06-05 DIAGNOSIS — Z79899 Other long term (current) drug therapy: Secondary | ICD-10-CM | POA: Diagnosis not present

## 2023-06-05 DIAGNOSIS — I10 Essential (primary) hypertension: Secondary | ICD-10-CM | POA: Diagnosis not present

## 2023-06-05 DIAGNOSIS — S99912A Unspecified injury of left ankle, initial encounter: Secondary | ICD-10-CM | POA: Diagnosis present

## 2023-06-05 DIAGNOSIS — S93401A Sprain of unspecified ligament of right ankle, initial encounter: Secondary | ICD-10-CM | POA: Diagnosis not present

## 2023-06-05 DIAGNOSIS — S8261XA Displaced fracture of lateral malleolus of right fibula, initial encounter for closed fracture: Secondary | ICD-10-CM | POA: Diagnosis not present

## 2023-06-05 MED ORDER — OXYCODONE HCL 5 MG PO TABS
5.0000 mg | ORAL_TABLET | Freq: Four times a day (QID) | ORAL | 0 refills | Status: AC | PRN
Start: 2023-06-05 — End: 2023-06-10

## 2023-06-05 MED ORDER — OXYCODONE-ACETAMINOPHEN 5-325 MG PO TABS
1.0000 | ORAL_TABLET | Freq: Once | ORAL | Status: AC
  Administered 2023-06-05: 1 via ORAL
  Filled 2023-06-05: qty 1

## 2023-06-05 MED ORDER — IBUPROFEN 200 MG PO TABS
600.0000 mg | ORAL_TABLET | Freq: Once | ORAL | Status: AC
  Administered 2023-06-05: 600 mg via ORAL
  Filled 2023-06-05: qty 1

## 2023-06-05 NOTE — ED Provider Notes (Signed)
[facility] Provider Note   CSN: [id number] Arrival date & time: 06/05/23  1426     History  Chief Complaint  Patient presents with   Ankle Injury    Shelly Pearson is a 82 y.o. female with history of hypertension, presents with concern for right and left ankle pain after twisting her ankles walking down the stairs just prior to arrival.  States she missed a stair and then landed in an unknown position on her ankles.  Immediately had pain of both ankles, and was unable to place weight on them.  Has been unable to ambulate.  Denies any paresthesias in the right or left lower extremity.   Ankle Injury       Home Medications Prior to Admission medications   Medication Sig Start Date End Date Taking? Authorizing Provider  oxyCODONE (ROXICODONE) 5 MG immediate release tablet Take 1 tablet (5 mg total) by mouth every 6 (six) hours as needed for up to 5 days for severe pain (pain score 7-10) or breakthrough pain (Pain not controlled with tylenol and ibuprofen). 06/05/23 06/10/23 Yes Arabella Merles, PA-C  atorvastatin (LIPITOR) 20 MG tablet Take 1 tablet (20 mg total) by mouth daily. 10/12/22   Loyola Mast, MD  BIOTIN PO 1 capsule    [provider]  levothyroxine (SYNTHROID) 50 MCG tablet Take 1 tablet (50 mcg total) by mouth daily before breakfast. 10/12/22   Loyola Mast, MD  losartan (COZAAR) 25 MG tablet Take 1 tablet (25 mg total) by mouth daily. 10/12/22   Loyola Mast, MD  Multiple Vitamins-Minerals (MATURE ADULT CENTURY PO)     [provider]  neomycin-polymyxin-hydrocortisone (CORTISPORIN) OTIC solution Place 3 drops into the right ear 4 (four) times daily. 02/17/23   Loyola Mast, MD  triamcinolone cream (KENALOG) 0.1 % Apply 1 Application topically 2 (two) times daily. 04/07/23   Loyola Mast, MD  trospium (SANCTURA) 20 MG tablet Take 1 tablet (20 mg total) by mouth at bedtime. 10/12/22   Loyola Mast, MD      Allergies    Adhesive  [tape]    Review of Systems   Review of Systems  Musculoskeletal:        Right and left ankle pain    Physical Exam Updated Vital Signs BP (!) 154/75 (BP Location: Left Arm)   Pulse 75   Temp 98.6 F (37 C) (Oral)   Resp 16   Ht 5\' 4"  (1.626 m)   Wt 60 kg   SpO2 97%   BMI 22.71 kg/m  Physical Exam Musculoskeletal:     Comments: Right lower extremity: General No erythema, edema, contusions, open wounds   Palpation Non tender over the knee Nontender along the tibia and fibula Nontender on the lateral and medial malleolus Tender along the ATFL, non-tender along the CFL, PTFL, achilles tendon Nontender along the first through fifth metatarsals Nontender over the 1st through 5th phalanges  ROM Able to perform flexion, extension, inversion and eversion of the right ankle but reduced secondary to pain  Sensation: Sensation intact throughout 1st through 5th digits of the right foot  Left lower extremity: General Significant edema over the left lateral malleolus, no erythema, contusions, open wounds   Palpation Non tender over the knee Nontender along the tibia and fibula Tender on the lateral malleolus, nontender over the medial malleolus Non-tender of achilles tendon Nontender along the first through fifth metatarsals Nontender over the 1st  through 5th phalanges  ROM Very limited flexion, extension, inversion and eversion of the right ankle secondary to pain  Sensation: Sensation intact throughout 1st through 5th digits of the right foot      ED Results / Procedures / Treatments   Labs (all labs ordered are listed, but only abnormal results are displayed) Labs Reviewed - No data to display  EKG None  Radiology DG Ankle Complete Left  Result Date: 06/05/2023 CLINICAL DATA:  Fall, twisted both ankles EXAM: LEFT ANKLE COMPLETE - 3+ VIEW; RIGHT FOOT COMPLETE - 3+ VIEW; LEFT FOOT - COMPLETE 3+ VIEW; RIGHT ANKLE  - COMPLETE 3+ VIEW COMPARISON:  None Available. FINDINGS: Mildly displaced bimalleolar fractures of the left ankle. Minimal widening of the ankle mortise. Severe soft tissue edema about the lateral malleolus. No acute fracture or dislocation of the right ankle. Chronic fracture deformity of the distal right lateral malleolus. No significant soft tissue edema about the right ankle. No fracture or dislocation of the feet. Status post bilateral first metatarsal osteotomy. IMPRESSION: 1. Mildly displaced bimalleolar fractures of the left ankle. Minimal widening of the ankle mortise. Severe soft tissue edema about the lateral malleolus. 2. No acute fracture or dislocation of the right ankle. Chronic fracture deformity of the distal right lateral malleolus. No significant soft tissue edema about the right ankle. 3. No fracture or dislocation of the feet. Electronically Signed   By: Jearld Lesch M.D.   On: 06/05/2023 15:55   DG Ankle Complete Right  Result Date: 06/05/2023 CLINICAL DATA:  Fall, twisted both ankles EXAM: LEFT ANKLE COMPLETE - 3+ VIEW; RIGHT FOOT COMPLETE - 3+ VIEW; LEFT FOOT - COMPLETE 3+ VIEW; RIGHT ANKLE - COMPLETE 3+ VIEW COMPARISON:  None Available. FINDINGS: Mildly displaced bimalleolar fractures of the left ankle. Minimal widening of the ankle mortise. Severe soft tissue edema about the lateral malleolus. No acute fracture or dislocation of the right ankle. Chronic fracture deformity of the distal right lateral malleolus. No significant soft tissue edema about the right ankle. No fracture or dislocation of the feet. Status post bilateral first metatarsal osteotomy. IMPRESSION: 1. Mildly displaced bimalleolar fractures of the left ankle. Minimal widening of the ankle mortise. Severe soft tissue edema about the lateral malleolus. 2. No acute fracture or dislocation of the right ankle. Chronic fracture deformity of the distal right lateral malleolus. No significant soft tissue edema about the right  ankle. 3. No fracture or dislocation of the feet. Electronically Signed   By: Jearld Lesch M.D.   On: 06/05/2023 15:55   DG Foot Complete Right  Result Date: 06/05/2023 CLINICAL DATA:  Fall, twisted both ankles EXAM: LEFT ANKLE COMPLETE - 3+ VIEW; RIGHT FOOT COMPLETE - 3+ VIEW; LEFT FOOT - COMPLETE 3+ VIEW; RIGHT ANKLE - COMPLETE 3+ VIEW COMPARISON:  None Available. FINDINGS: Mildly displaced bimalleolar fractures of the left ankle. Minimal widening of the ankle mortise. Severe soft tissue edema about the lateral malleolus. No acute fracture or dislocation of the right ankle. Chronic fracture deformity of the distal right lateral malleolus. No significant soft tissue edema about the right ankle. No fracture or dislocation of the feet. Status post bilateral first metatarsal osteotomy. IMPRESSION: 1. Mildly displaced bimalleolar fractures of the left ankle. Minimal widening of the ankle mortise. Severe soft tissue edema about the lateral malleolus. 2. No acute fracture or dislocation of the right ankle. Chronic fracture deformity of the distal right lateral malleolus. No significant soft tissue edema about the right ankle. 3. No fracture or  dislocation of the feet. Electronically Signed   By: Jearld Lesch M.D.   On: 06/05/2023 15:55   DG Foot Complete Left  Result Date: 06/05/2023 CLINICAL DATA:  Fall, twisted both ankles EXAM: LEFT ANKLE COMPLETE - 3+ VIEW; RIGHT FOOT COMPLETE - 3+ VIEW; LEFT FOOT - COMPLETE 3+ VIEW; RIGHT ANKLE - COMPLETE 3+ VIEW COMPARISON:  None Available. FINDINGS: Mildly displaced bimalleolar fractures of the left ankle. Minimal widening of the ankle mortise. Severe soft tissue edema about the lateral malleolus. No acute fracture or dislocation of the right ankle. Chronic fracture deformity of the distal right lateral malleolus. No significant soft tissue edema about the right ankle. No fracture or dislocation of the feet. Status post bilateral first metatarsal osteotomy. IMPRESSION:  1. Mildly displaced bimalleolar fractures of the left ankle. Minimal widening of the ankle mortise. Severe soft tissue edema about the lateral malleolus. 2. No acute fracture or dislocation of the right ankle. Chronic fracture deformity of the distal right lateral malleolus. No significant soft tissue edema about the right ankle. 3. No fracture or dislocation of the feet. Electronically Signed   By: Jearld Lesch M.D.   On: 06/05/2023 15:55    Procedures Procedures    Medications Ordered in ED Medications  oxyCODONE-acetaminophen (PERCOCET/ROXICET) 5-325 MG per tablet 1 tablet (1 tablet Oral Given 06/05/23 1624)  ibuprofen (ADVIL) tablet 600 mg (600 mg Oral Given 06/05/23 1624)    ED Course/ Medical Decision Making/ A&P                                 Medical Decision Making Amount and/or Complexity of Data Reviewed Radiology: ordered.  Risk Prescription drug management.     Differential diagnosis includes but is not limited to fracture, dislocation, sprain, strain  ED Course:  Patient overall well-appearing, but unable to ambulate secondary to pain in her ankles.  She has pain over the ATFL of the right ankle, no tenderness palpation elsewhere.  X-rays of the right ankle and right foot negative for fracture or dislocation.  Patient range of motion limited secondary to pain.  Neurovascular intact in the right lower extremity.  Suspect ankle sprain, right lower extremity placed in a cam walking boot.   Patient has obvious edema over the lateral malleolus of the left ankle.  Diffusely tender to palpation here.  She has very limited range of motion of the left ankle secondary to pain.  Neurovascular intact in the left lower extremity.  X-ray showed mildly displaced bimalleolar fracture of the left ankle with minimal widening of the ankle mortise. I discussed this with Dr. Rubin Payor, he recommends posterior and sirrup splint, outpatient follow up with orthopedics. Since patient unable to  place weight on the right foot, will not be able to use walker or crutches.  I discussed possibly syncope tired to help him get home, but they would like to go home POV.  I offered to reach out to social work to coordinate getting home health and wheelchair for them. They state that their daughter works in Psychologist, clinical and is able to get a wheelchair and ramp for them, but request the social work consult just in case. They state they already have crutches at home they can use. I have placed a social work consult to make sure they can get wheelchair or home health needs. Patient was given Percocet and ibuprofen for pain.  She was placed in a posterior  stirrup splint for the left ankle fracture.  Remains neurovascular intact after splint placement.  Appropriate for discharge home at this time.  Impression: Right ankle sprain Mildly displaced bimalleolar fracture of the left ankle  Disposition:  The patient was discharged home with instructions to follow-up with orthopedics in the next week.  Their contact information is provided.  Tylenol and ibuprofen for pain.  Oxycodone for breakthrough pain. Remain non-weight bearing on the left lower extremity until cleared by orthopedics Return precautions given.   Imaging Studies ordered: I ordered imaging studies including x-ray left ankle, x-ray left foot, x ray Right ankle, x-ray right foot I independently visualized the imaging with scope of interpretation limited to determining acute life threatening conditions related to emergency care. Imaging showed mildly displaced bimalleolar fracture of the left ankle, minimal widening of ankle mortise.  I agree with the radiologist interpretation               Final Clinical Impression(s) / ED Diagnoses Final diagnoses:  Moderate right ankle sprain, initial encounter  Bimalleolar fracture, left, closed, initial encounter    Rx / DC Orders ED Discharge Orders          Ordered     oxyCODONE (ROXICODONE) 5 MG immediate release tablet  Every 6 hours PRN        06/05/23 1639              Arabella Merles, PA-C 06/05/23 1656    Benjiman Core, MD 06/05/23 2206

## 2023-06-05 NOTE — ED Notes (Signed)
Patient transported to X-ray 

## 2023-06-05 NOTE — Discharge Instructions (Addendum)
You have a fracture of your left ankle.  You have been placed in a splint.  Please keep this splint clean and dry.  You may not place any weight on your left ankle/foot until cleared by orthopedics.  You have an ankle sprain of your right ankle.  You have been placed into a walking boot to help with pain.  You may transition out of the boot as pain allows.  You may place weight on the right ankle and foot.  Follow-up with either of the orthopedic offices listed below within the next week.  They will further manage your left ankle fracture and right ankle sprain.  I have placed a social work consult.  They should be contacting you to help arrange for a wheelchair or home health needs.  Take 1000mg  tylenol, then 3 hours later 600mg  ibuprofen, then 3 hours later 1000mg  tylenol, so on and so forth for pain control.  You have been prescribed Oxycodone-this is a narcotic/controlled substance medication that has potential addicting qualities.  You may take 1 tablet every 6 hours as needed for severe pain.  Do not drive or operate heavy machinery when taking this medicine as it can be sedating. Do not drink alcohol or take other sedating medications when taking this medicine for safety reasons.  Keep this out of reach of small children.  You were given a dose of a similar medication today.  Your next dose can be no sooner than 10:30 PM tonight  Return to the ER for any severe worsening or pain or pain not controlled with your pain medications, numbness or tingling in the foot, any other new or concerning symptoms.   Your x-ray result is as below:  "IMPRESSION: 1. Mildly displaced bimalleolar fractures of the left ankle. Minimal widening of the ankle mortise. Severe soft tissue edema about the lateral malleolus. 2. No acute fracture or dislocation of the right ankle. Chronic fracture deformity of the distal right lateral malleolus. No significant soft tissue edema about the right ankle. 3. No fracture  or dislocation of the feet."

## 2023-06-05 NOTE — ED Notes (Signed)
Discharge paperwork reviewed entirely with patient, including follow up care. Pain was under control. The patient received instruction and coaching on their prescriptions, and all follow-up questions were answered.  Pt verbalized understanding as well as all parties involved. No questions or concerns voiced at the time of discharge. No acute distress noted.   Pt was wheeled out to the PVA in a wheelchair without incident.  Pt advised they will notify their PCP immediately. and Pt advised they will seek followup care with a specialist and followup with their PCP.

## 2023-06-05 NOTE — ED Notes (Signed)
ED Provider at bedside. 

## 2023-06-05 NOTE — ED Triage Notes (Signed)
Pt was going down steps, missed step, twisted bilateral ankles and foot, swelling and pain to left foot worse  Unable to bear weight on either foot

## 2023-06-07 ENCOUNTER — Telehealth (HOSPITAL_BASED_OUTPATIENT_CLINIC_OR_DEPARTMENT_OTHER): Payer: Self-pay | Admitting: Orthopaedic Surgery

## 2023-06-07 ENCOUNTER — Other Ambulatory Visit (HOSPITAL_BASED_OUTPATIENT_CLINIC_OR_DEPARTMENT_OTHER): Payer: Self-pay

## 2023-06-07 ENCOUNTER — Ambulatory Visit (HOSPITAL_BASED_OUTPATIENT_CLINIC_OR_DEPARTMENT_OTHER): Payer: Medicare Other | Admitting: Student

## 2023-06-07 ENCOUNTER — Encounter (HOSPITAL_BASED_OUTPATIENT_CLINIC_OR_DEPARTMENT_OTHER): Payer: Self-pay | Admitting: Student

## 2023-06-07 DIAGNOSIS — S93401A Sprain of unspecified ligament of right ankle, initial encounter: Secondary | ICD-10-CM | POA: Diagnosis not present

## 2023-06-07 DIAGNOSIS — S82842A Displaced bimalleolar fracture of left lower leg, initial encounter for closed fracture: Secondary | ICD-10-CM | POA: Diagnosis not present

## 2023-06-07 MED ORDER — IBUPROFEN 800 MG PO TABS
800.0000 mg | ORAL_TABLET | Freq: Three times a day (TID) | ORAL | 0 refills | Status: AC
Start: 2023-06-07 — End: 2023-06-17
  Filled 2023-06-07: qty 30, 10d supply, fill #0

## 2023-06-07 MED ORDER — ASPIRIN 325 MG PO TBEC
325.0000 mg | DELAYED_RELEASE_TABLET | Freq: Every day | ORAL | 0 refills | Status: DC
  Filled 2023-06-07: qty 14, 14d supply, fill #0

## 2023-06-07 MED ORDER — ACETAMINOPHEN 500 MG PO TABS
500.0000 mg | ORAL_TABLET | Freq: Three times a day (TID) | ORAL | 0 refills | Status: AC
  Filled 2023-06-07: qty 30, 10d supply, fill #0

## 2023-06-07 NOTE — Telephone Encounter (Signed)
Returned call to patient who asked about her normal daily meds and if she can take those pre op. I told her I talked to you about taking those meds with a sip of water in the AM and she voiced understanding and said she will also clarify with pre op when they call her as well.

## 2023-06-07 NOTE — Telephone Encounter (Signed)
Patient wants to know if she should take her meds her surgery is at 530 tomorrow afternoon. Please advise [phone]

## 2023-06-07 NOTE — Progress Notes (Signed)
Chief Complaint: Bilateral ankle pain     History of Present Illness:    Shelly Pearson is a 82 y.o. female presenting today for evaluation of injuries to both ankles.  She reports that 2 days ago she missed a step while walking down stairs at home and both of her ankles twisted.  She was immediately not able to weight-bear on either ankle.  She was seen in the ED and left ankle was placed in a lower leg splint for her bimalleolar fracture.  Right ankle is in a short boot today for suspected sprain.  Today she reports pain at a 9/10.  Has been taking Tylenol and ibuprofen.  She is still nonweightbearing.  Pain has affected her ability to sleep at night.  ED also prescribed oxycodone which she still has but is not taking.   Surgical History:   None  PMH/PSH/Family History/Social History/Meds/Allergies:    Past Medical History:  Diagnosis Date   B12 deficiency 03/02/2020   GERD (gastroesophageal reflux disease)    Hypertension    Seasonal allergies    Thyroid disease    Urge incontinence of urine 12/10/2020   Urgency incontinence    Past Surgical History:  Procedure Laterality Date   ABDOMINAL HYSTERECTOMY     CESAREAN SECTION     CYSTOCELE REPAIR     ROTATOR CUFF REPAIR Right    2018   Social History   Socioeconomic History   Marital status: Married    Spouse name: Not on file   Number of children: Not on file   Years of education: Not on file   Highest education level: Bachelor's degree (e.g., BA, AB, BS)  Occupational History   Occupation: Retired  Tobacco Use   Smoking status: Never   Smokeless tobacco: Never  Vaping Use   Vaping status: Never Used  Substance and Sexual Activity   Alcohol use: Yes    Comment: very rare    Drug use: No   Sexual activity: Not Currently  Other Topics Concern   Not on file  Social History Narrative   Lives with husband   Right handed   1 cup tea every morning   Social Determinants of Health    Financial Resource Strain: Low Risk  (10/06/2022)   Overall Financial Resource Strain (CARDIA)    Difficulty of Paying Living Expenses: Not hard at all  Food Insecurity: No Food Insecurity (10/06/2022)   Hunger Vital Sign    Worried About Running Out of Food in the Last Year: Never true    Ran Out of Food in the Last Year: Never true  Transportation Needs: No Transportation Needs (10/06/2022)   PRAPARE - Administrator, Civil Service (Medical): No    Lack of Transportation (Non-Medical): No  Physical Activity: Insufficiently Active (10/06/2022)   Exercise Vital Sign    Days of Exercise per Week: 3 days    Minutes of Exercise per Session: 40 min  Stress: Stress Concern Present (10/06/2022)   [employer] of Occupational Health - Occupational Stress Questionnaire    Feeling of Stress : To some extent  Social Connections: Socially Integrated (10/06/2022)   Social Connection and Isolation Panel [NHANES]    Frequency of Communication with Friends and Family: Three times a week    Frequency of Social Gatherings with  Friends and Family: Once a week    Attends Religious Services: More than 4 times per year    Active Member of Clubs or Organizations: Yes    Attends Engineer, structural: More than 4 times per year    Marital Status: Married   Family History  Problem Relation Age of Onset   Stroke Mother    Cancer Father    Cancer Sister    Cancer Brother    Allergies  Allergen Reactions   Adhesive  [Tape] Rash   Current Outpatient Medications  Medication Sig Dispense Refill   acetaminophen (TYLENOL) 500 MG tablet Take 1 tablet (500 mg total) by mouth every 8 (eight) hours for 10 days. (Patient taking differently: Take 1,000 mg by mouth every 6 (six) hours as needed for moderate pain (pain score 4-6).) 30 tablet 0   aspirin EC 325 MG tablet Take 1 tablet (325 mg total) by mouth daily. 14 tablet 0   ibuprofen (ADVIL) 800 MG tablet Take 1 tablet (800 mg total) by mouth  every 8 (eight) hours for 10 days. Please take with food, please alternate with acetaminophen 30 tablet 0   atorvastatin (LIPITOR) 20 MG tablet Take 1 tablet (20 mg total) by mouth daily. 90 tablet 3   Biotin 10 MG CAPS Take 10 mg by mouth daily.     calcium carbonate (TUMS - DOSED IN MG ELEMENTAL CALCIUM) 500 MG chewable tablet Chew 1 tablet by mouth daily as needed for indigestion or heartburn.     ibuprofen (ADVIL) 200 MG tablet Take 600 mg by mouth every 8 (eight) hours as needed for moderate pain (pain score 4-6).     levothyroxine (SYNTHROID) 50 MCG tablet Take 1 tablet (50 mcg total) by mouth daily before breakfast. 90 tablet 3   losartan (COZAAR) 25 MG tablet Take 1 tablet (25 mg total) by mouth daily. 90 tablet 3   Multiple Vitamins-Minerals (MATURE ADULT CENTURY PO) Take 1 tablet by mouth daily.     oxyCODONE (ROXICODONE) 5 MG immediate release tablet Take 1 tablet (5 mg total) by mouth every 6 (six) hours as needed for up to 5 days for severe pain (pain score 7-10) or breakthrough pain (Pain not controlled with tylenol and ibuprofen). (Patient not taking: Reported on 06/07/2023) 20 tablet 0   Polyethyl Glycol-Propyl Glycol (SYSTANE OP) Place 2 drops into both eyes 3 (three) times daily.     trospium (SANCTURA) 20 MG tablet Take 1 tablet (20 mg total) by mouth at bedtime. 90 tablet 3   No current facility-administered medications for this visit.   No results found.  Review of Systems:   A ROS was performed including pertinent positives and negatives as documented in the HPI.  Physical Exam :   Constitutional: NAD and appears stated age Neurological: Alert and oriented Psych: Appropriate affect and cooperative There were no vitals taken for this visit.   Comprehensive Musculoskeletal Exam:    Right ankle appears mildly swollen with tenderness over the lateral ankle ligaments.  Active ROM to 10 degrees dorsiflexion and 20 degrees plantarflexion.  Negative anterior drawer. Left  ankle remains in splint.  All 5 toes are warm and well-perfused.  Neurosensory exam to the toes is intact.  Imaging:   Xray review from 06/05/2023 (right ankle 3 views, left ankle 3 views): Bimalleolar fracture of the left ankle with mild displacement of the medial malleolus and slight widening of the ankle mortise.  Likely prior and healed fracture of the right lateral  malleolus without evidence of acute fracture or dislocation in the right ankle.   I personally reviewed and interpreted the radiographs.   Assessment:   82 y.o. female with evidence of a left ankle bimalleolar fracture after a fall 2 days ago.  She is neurovascularly intact.  Due to the unstable nature of this fracture I did discuss with her and her husband that this will likely require surgical fixation.  This will allow her to begin weightbearing earlier as well as allow for proper healing.  They are agreeable to this plan so we will proceed to schedule surgery likely tomorrow with Dr. Steward Drone.  Will send her with a tall walking boot to be placed and after surgery as well as postop meds.  Did not send oxycodone as she has approximately 15 remaining from the ED.  Her right ankle pain is consistent with a lateral ankle sprain as x-rays did not show evidence of fracture.  Discussed that she can come out of the boot as tolerated and may consider utilizing an ankle brace.  Plan :    -Plan for left ankle ORIF with Dr. Steward Drone -Walking boot and postop meds given today    I personally saw and evaluated the patient, and participated in the management and treatment plan.  Hazle Nordmann, PA-C Orthopedics

## 2023-06-08 ENCOUNTER — Other Ambulatory Visit (HOSPITAL_BASED_OUTPATIENT_CLINIC_OR_DEPARTMENT_OTHER): Payer: Self-pay | Admitting: Orthopaedic Surgery

## 2023-06-08 ENCOUNTER — Encounter: Payer: Self-pay | Admitting: Orthopaedic Surgery

## 2023-06-08 ENCOUNTER — Ambulatory Visit: Payer: Self-pay | Admitting: Orthopaedic Surgery

## 2023-06-08 ENCOUNTER — Ambulatory Visit: Payer: Medicare Other

## 2023-06-08 ENCOUNTER — Encounter
Admission: RE | Disposition: A | Discharge status: Home / Self Care | Payer: Self-pay | Source: Home / Self Care | Attending: Orthopaedic Surgery

## 2023-06-08 ENCOUNTER — Ambulatory Visit
Admission: RE | Admit: 2023-06-08 | Discharge: 2023-06-08 | Disposition: A | Discharge status: Home / Self Care | Payer: Medicare Other | Attending: Orthopaedic Surgery | Admitting: Orthopaedic Surgery

## 2023-06-08 ENCOUNTER — Ambulatory Visit: Payer: Medicare Other | Admitting: Anesthesiology

## 2023-06-08 ENCOUNTER — Other Ambulatory Visit: Payer: Self-pay

## 2023-06-08 DIAGNOSIS — S82891A Other fracture of right lower leg, initial encounter for closed fracture: Secondary | ICD-10-CM | POA: Insufficient documentation

## 2023-06-08 DIAGNOSIS — X501XXA Overexertion from prolonged static or awkward postures, initial encounter: Secondary | ICD-10-CM | POA: Insufficient documentation

## 2023-06-08 DIAGNOSIS — G8918 Other acute postprocedural pain: Secondary | ICD-10-CM | POA: Diagnosis not present

## 2023-06-08 DIAGNOSIS — E039 Hypothyroidism, unspecified: Secondary | ICD-10-CM | POA: Insufficient documentation

## 2023-06-08 DIAGNOSIS — K219 Gastro-esophageal reflux disease without esophagitis: Secondary | ICD-10-CM | POA: Insufficient documentation

## 2023-06-08 DIAGNOSIS — Z79890 Hormone replacement therapy (postmenopausal): Secondary | ICD-10-CM | POA: Insufficient documentation

## 2023-06-08 DIAGNOSIS — Z01810 Encounter for preprocedural cardiovascular examination: Secondary | ICD-10-CM | POA: Diagnosis not present

## 2023-06-08 DIAGNOSIS — S82842A Displaced bimalleolar fracture of left lower leg, initial encounter for closed fracture: Secondary | ICD-10-CM

## 2023-06-08 DIAGNOSIS — Y9301 Activity, walking, marching and hiking: Secondary | ICD-10-CM | POA: Diagnosis not present

## 2023-06-08 DIAGNOSIS — S82892A Other fracture of left lower leg, initial encounter for closed fracture: Secondary | ICD-10-CM | POA: Diagnosis not present

## 2023-06-08 DIAGNOSIS — I10 Essential (primary) hypertension: Secondary | ICD-10-CM

## 2023-06-08 DIAGNOSIS — S8252XA Displaced fracture of medial malleolus of left tibia, initial encounter for closed fracture: Secondary | ICD-10-CM | POA: Diagnosis not present

## 2023-06-08 HISTORY — PX: ORIF ANKLE FRACTURE: SHX5408

## 2023-06-08 LAB — CBC
HCT: 40.8 % (ref 36.0–46.0)
Hemoglobin: 13.5 g/dL (ref 12.0–15.0)
MCH: 29.3 pg (ref 26.0–34.0)
MCHC: 33.1 g/dL (ref 30.0–36.0)
MCV: 88.7 fL (ref 80.0–100.0)
Platelets: 194 10*3/uL (ref 150–400)
RBC: 4.6 MIL/uL (ref 3.87–5.11)
RDW: 13.2 % (ref 11.5–15.5)
WBC: 5.5 10*3/uL (ref 4.0–10.5)
nRBC: 0 % (ref 0.0–0.2)

## 2023-06-08 SURGERY — OPEN REDUCTION INTERNAL FIXATION (ORIF) ANKLE FRACTURE
Anesthesia: General | Site: Ankle | Laterality: Left

## 2023-06-08 MED ORDER — FENTANYL CITRATE (PF) 100 MCG/2ML IJ SOLN: 25.0000 ug | INTRAMUSCULAR | Status: DC | PRN

## 2023-06-08 MED ORDER — CEFAZOLIN SODIUM-DEXTROSE 2-4 GM/100ML-% IV SOLN
INTRAVENOUS | Status: AC
  Filled 2023-06-08: qty 100

## 2023-06-08 MED ORDER — GABAPENTIN 300 MG PO CAPS: 300.0000 mg | ORAL_CAPSULE | Freq: Once | ORAL | Status: DC

## 2023-06-08 MED ORDER — BUPIVACAINE LIPOSOME 1.3 % IJ SUSP
INTRAMUSCULAR | Status: DC | PRN
  Administered 2023-06-08 (×2): 10 mL via PERINEURAL

## 2023-06-08 MED ORDER — ONDANSETRON HCL 4 MG/2ML IJ SOLN
INTRAMUSCULAR | Status: AC
  Filled 2023-06-08: qty 2

## 2023-06-08 MED ORDER — ACETAMINOPHEN 10 MG/ML IV SOLN
INTRAVENOUS | Status: AC
  Filled 2023-06-08: qty 100

## 2023-06-08 MED ORDER — ACETAMINOPHEN 10 MG/ML IV SOLN
INTRAVENOUS | Status: DC | PRN
  Administered 2023-06-08: 1000 mg via INTRAVENOUS

## 2023-06-08 MED ORDER — FENTANYL CITRATE (PF) 100 MCG/2ML IJ SOLN
INTRAMUSCULAR | Status: AC
  Filled 2023-06-08: qty 2

## 2023-06-08 MED ORDER — OXYCODONE HCL 5 MG/5ML PO SOLN: 5.0000 mg | Freq: Once | ORAL | Status: DC | PRN

## 2023-06-08 MED ORDER — LACTATED RINGERS IV SOLN
INTRAVENOUS | Status: DC
Start: 2023-06-08 — End: 2023-06-09

## 2023-06-08 MED ORDER — PROPOFOL 10 MG/ML IV BOLUS
INTRAVENOUS | Status: AC
  Filled 2023-06-08: qty 20

## 2023-06-08 MED ORDER — TRANEXAMIC ACID-NACL 1000-0.7 MG/100ML-% IV SOLN
1000.0000 mg | INTRAVENOUS | Status: AC
  Administered 2023-06-08: 1000 mg via INTRAVENOUS

## 2023-06-08 MED ORDER — OXYCODONE HCL 5 MG PO TABS: 5.0000 mg | ORAL_TABLET | Freq: Once | ORAL | Status: DC | PRN

## 2023-06-08 MED ORDER — PHENYLEPHRINE 80 MCG/ML (10ML) SYRINGE FOR IV PUSH (FOR BLOOD PRESSURE SUPPORT)
PREFILLED_SYRINGE | INTRAVENOUS | Status: DC | PRN
  Administered 2023-06-08: 160 ug via INTRAVENOUS
  Administered 2023-06-08 (×2): 80 ug via INTRAVENOUS
  Administered 2023-06-08: 160 ug via INTRAVENOUS
  Administered 2023-06-08 (×2): 80 ug via INTRAVENOUS

## 2023-06-08 MED ORDER — 0.9 % SODIUM CHLORIDE (POUR BTL) OPTIME
TOPICAL | Status: DC | PRN
Start: 2023-06-08 — End: 2023-06-08
  Administered 2023-06-08: 500 mL

## 2023-06-08 MED ORDER — CHLORHEXIDINE GLUCONATE 0.12 % MT SOLN: 15.0000 mL | Freq: Once | OROMUCOSAL | Status: DC

## 2023-06-08 MED ORDER — CEFAZOLIN SODIUM-DEXTROSE 2-4 GM/100ML-% IV SOLN
2.0000 g | INTRAVENOUS | Status: AC
  Administered 2023-06-08: 2 g via INTRAVENOUS

## 2023-06-08 MED ORDER — MIDAZOLAM HCL 2 MG/2ML IJ SOLN
INTRAMUSCULAR | Status: DC | PRN
  Administered 2023-06-08: 2 mg via INTRAVENOUS

## 2023-06-08 MED ORDER — EPHEDRINE SULFATE-NACL 50-0.9 MG/10ML-% IV SOSY
PREFILLED_SYRINGE | INTRAVENOUS | Status: DC | PRN
  Administered 2023-06-08: 5 mg via INTRAVENOUS

## 2023-06-08 MED ORDER — FENTANYL CITRATE (PF) 100 MCG/2ML IJ SOLN
INTRAMUSCULAR | Status: DC | PRN
  Administered 2023-06-08 (×2): 50 ug via INTRAVENOUS

## 2023-06-08 MED ORDER — PROPOFOL 10 MG/ML IV BOLUS
INTRAVENOUS | Status: DC | PRN
  Administered 2023-06-08: 100 ug/kg/min via INTRAVENOUS

## 2023-06-08 MED ORDER — ORAL CARE MOUTH RINSE: 15.0000 mL | Freq: Once | OROMUCOSAL | Status: DC

## 2023-06-08 MED ORDER — TRANEXAMIC ACID-NACL 1000-0.7 MG/100ML-% IV SOLN
INTRAVENOUS | Status: AC
  Filled 2023-06-08: qty 100

## 2023-06-08 MED ORDER — BUPIVACAINE HCL (PF) 0.5 % IJ SOLN
INTRAMUSCULAR | Status: DC | PRN
  Administered 2023-06-08 (×2): 10 mL via PERINEURAL

## 2023-06-08 MED ORDER — EPHEDRINE 5 MG/ML INJ
INTRAVENOUS | Status: AC
  Filled 2023-06-08: qty 5

## 2023-06-08 MED ORDER — ACETAMINOPHEN 500 MG PO TABS: 1000.0000 mg | ORAL_TABLET | Freq: Once | ORAL | Status: DC

## 2023-06-08 MED ORDER — ONDANSETRON HCL 4 MG/2ML IJ SOLN
INTRAMUSCULAR | Status: DC | PRN
  Administered 2023-06-08: 4 mg via INTRAVENOUS

## 2023-06-08 SURGICAL SUPPLY — 50 items
BAG COUNTER SPONGE SURGICOUNT (BAG) ×1 IMPLANT
BIT DRILL 2.9 CANN QC NONSTRL (BIT) IMPLANT
BIT DRILL SHORT 2.5 (BIT) IMPLANT
BIT DRL SHORT 2.5 (BIT) ×1
BLADE SURG SZ10 CARB STEEL (BLADE) ×1 IMPLANT
BNDG COHESIVE 4X5 TAN STRL LF (GAUZE/BANDAGES/DRESSINGS) ×1 IMPLANT
BNDG ELASTIC 6INX 5YD STR LF (GAUZE/BANDAGES/DRESSINGS) ×1 IMPLANT
BNDG ESMARCH 4X12 STRL LF (GAUZE/BANDAGES/DRESSINGS) IMPLANT
BNDG GAUZE DERMACEA FLUFF 4 (GAUZE/BANDAGES/DRESSINGS) ×1 IMPLANT
BOOT STEPPER DURA LG (SOFTGOODS) IMPLANT
BOOT STEPPER DURA MED (SOFTGOODS) IMPLANT
CHLORAPREP W/TINT 26 (MISCELLANEOUS) ×1 IMPLANT
DRAPE C-ARM XRAY 36X54 (DRAPES) ×1 IMPLANT
DRAPE C-ARMOR (DRAPES) ×1 IMPLANT
DRAPE U-SHAPE 47X51 STRL (DRAPES) ×1 IMPLANT
DRSG ADAPTIC 3X8 NADH LF (GAUZE/BANDAGES/DRESSINGS) ×1 IMPLANT
ELECT REM PT RETURN 9FT ADLT (ELECTROSURGICAL) ×1
ELECTRODE REM PT RTRN 9FT ADLT (ELECTROSURGICAL) ×1 IMPLANT
GAUZE PAD ABD 8X10 STRL (GAUZE/BANDAGES/DRESSINGS) ×1 IMPLANT
GAUZE SPONGE 4X4 12PLY STRL (GAUZE/BANDAGES/DRESSINGS) ×1 IMPLANT
GAUZE XEROFORM 1X8 LF (GAUZE/BANDAGES/DRESSINGS) ×1 IMPLANT
GLOVE BIO SURGEON STRL SZ 6 (GLOVE) ×1 IMPLANT
GLOVE BIO SURGEON STRL SZ7.5 (GLOVE) ×1 IMPLANT
GLOVE BIOGEL PI IND STRL 6.5 (GLOVE) ×1 IMPLANT
GLOVE BIOGEL PI IND STRL 8 (GLOVE) ×1 IMPLANT
GOWN SRG XL LVL 3 NONREINFORCE (GOWNS) ×1 IMPLANT
GOWN STRL REUS W/ TWL LRG LVL3 (GOWN DISPOSABLE) ×2 IMPLANT
K-WIRE ACE 1.6X6 (WIRE) ×2
KIT TURNOVER KIT A (KITS) ×1 IMPLANT
KWIRE ACE 1.6X6 (WIRE) IMPLANT
MANIFOLD NEPTUNE II (INSTRUMENTS) ×1 IMPLANT
MAT ABSORB FLUID 56X50 GRAY (MISCELLANEOUS) ×1 IMPLANT
NS IRRIG 500ML POUR BTL (IV SOLUTION) IMPLANT
PACK EXTREMITY ARMC (MISCELLANEOUS) ×1 IMPLANT
PAD ARMBOARD 7.5X6 YLW CONV (MISCELLANEOUS) ×2 IMPLANT
PADDING CAST BLEND 4X4 STRL (MISCELLANEOUS) ×1 IMPLANT
PLATE 1/3 TUBULAR 6H (Plate) IMPLANT
SCREW ACE CAN 4.0 60M (Screw) IMPLANT
SCREW NLOCK 3.5X20 (Screw) IMPLANT
SCREW NLOCK ALPS 3.5X14 (Screw) IMPLANT
SCREW NLOCK ALPS 3.5X18 (Screw) IMPLANT
SCREW NON-LOCK 3.5X10 (Screw) IMPLANT
SPONGE T-LAP 18X18 ~~LOC~~+RFID (SPONGE) ×1 IMPLANT
STOCKINETTE M/LG 89821 (MISCELLANEOUS) IMPLANT
SUCTION TUBE FRAZIER 10FR DISP (SUCTIONS) ×1 IMPLANT
SUT ETHILON 3 0 FSL (SUTURE) ×2 IMPLANT
SUT ETHILON 3-0 (SUTURE) IMPLANT
SUT VIC AB 0 CT1 36 (SUTURE) ×1 IMPLANT
SUT VIC AB 2-0 CT1 TAPERPNT 27 (SUTURE) ×1 IMPLANT
TOWEL OR 17X26 4PK STRL BLUE (TOWEL DISPOSABLE) ×1 IMPLANT

## 2023-06-08 NOTE — Discharge Instructions (Signed)
Discharge Instructions    Attending Surgeon: Huel Cote, MD Office Phone Number: [phone]   Diagnosis and Procedures:    Surgeries Performed: Left ankle fixation  Discharge Plan:    Diet: Resume usual diet. Begin with light or bland foods.  Drink plenty of fluids.  Activity:  Weight bearing as tolerated in cam boot. You are advised to go home directly from the hospital or surgical center. Restrict your activities.  GENERAL INSTRUCTIONS: 1.  Please apply ice to your wound to help with swelling and inflammation. This will improve your comfort and your overall recovery following surgery.     2. Please call Dr. Serena Croissant office at [phone] with questions Monday-Friday during business hours. If no one answers, please leave a message and someone should get back to the patient within 24 hours. For emergencies please call 911 or proceed to the emergency room.   3. Patient to notify surgical team if experiences any of the following: Bowel/Bladder dysfunction, uncontrolled pain, nerve/muscle weakness, incision with increased drainage or redness, nausea/vomiting and Fever greater than 101.0 F.  Be alert for signs of infection including redness, streaking, odor, fever or chills. Be alert for excessive pain or bleeding and notify your surgeon immediately.  WOUND INSTRUCTIONS:   Leave your dressing, cast, or splint in place until your post operative visit.  Keep it clean and dry.  Always keep the incision clean and dry until the staples/sutures are removed. If there is no drainage from the incision you should keep it open to air. If there is drainage from the incision you must keep it covered at all times until the drainage stops  Do not soak in a bath tub, hot tub, pool, lake or other body of water until 21 days after your surgery and your incision is completely dry and healed.  If you have removable sutures (or staples) they must be removed 10-14 days (unless otherwise  instructed) from the day of your surgery.     1)  Elevate the extremity as much as possible.  2)  Keep the dressing clean and dry.  3)  Please call us if the dressing becomes wet or dirty.  4)  If you are experiencing worsening pain or worsening swelling, please call.     MEDICATIONS: Resume all previous home medications at the previous prescribed dose and frequency unless otherwise noted Start taking the  pain medications on an as-needed basis as prescribed  Please taper down pain medication over the next week following surgery.  Ideally you should not require a refill of any narcotic pain medication.  Take pain medication with food to minimize nausea. In addition to the prescribed pain medication, you may take over-the-counter pain relievers such as Tylenol.  Do NOT take additional tylenol if your pain medication already has tylenol in it.  Aspirin 325mg  daily per instructions on bottle. Narcotic policy: Per [facility] clinic policy, our goal is ensure optimal postoperative pain control with a multimodal pain management strategy. For all [facility] patients, our goal is to wean post-operative narcotic medications by 6 weeks post-operatively, and many times sooner. If this is not possible due to utilization of pain medication prior to surgery, your [facility] doctor will support your acute post-operative pain control for the first 6 weeks postoperatively, with a plan to transition you back to your primary pain team following that. Cyndia Skeeters will work to ensure a Therapist, occupational.       FOLLOWUP INSTRUCTIONS: 1. Follow up at the Physical Therapy  Clinic 3-4 days following surgery. This appointment should be scheduled unless other arrangements have been made.The Physical Therapy scheduling number is [phone] if an appointment has not already been arranged.  2. Contact Dr. Serena Croissant office during office hours at [phone] or the practice after hours line at [phone] for non-emergencies.  For medical emergencies call 911.   Discharge Location: Home

## 2023-06-08 NOTE — Brief Op Note (Signed)
   Brief Op Note  Date of Surgery: 06/08/2023  Preoperative Diagnosis: LEFT BIMALLEOLAR FRACTURE  Postoperative Diagnosis: same  Procedure: Procedure(s): LEFT OPEN REDUCTION INTERNAL FIXATION (ORIF) ANKLE FRACTURE  Implants: Implant Name Type Inv. Item Serial No. Manufacturer Lot No. LRB No. Used Action  PLATE 1/3 TUBULAR 6H - [id number] Plate PLATE 1/3 TUBULAR 6H  [employer]ON(ORTH,TRAU,BIO,SG)  Left 1 Implanted  SCREW NLOCK 3.5X20 - [id number] Screw SCREW NLOCK 3.5X20  [employer]ON(ORTH,TRAU,BIO,SG)  Left 1 Implanted  SCREW NON-LOCK 3.5X10 - [id number] Screw SCREW NON-LOCK 3.5X10  ZIM[employer]ON(ORTH,TRAU,BIO,SG)  Left 3 Implanted  SCREW NLOCK ALPS 3.5X14 - [id number] Screw SCREW NLOCK ALPS 3.5X14  ZIMMER [employer]ON(ORTH,TRAU,BIO,SG)  Left 2 Implanted  SCREW ACE CAN 4.0 72M - [id number] Screw SCREW ACE CAN 4.0 72M  ZIMMER [employer]ON(ORTH,TRAU,BIO,SG)  Left 1 Implanted  SCREW ACE CAN 4.0 72M - [id number] Screw SCREW ACE CAN 4.0 72M  ZIMMER [employer]ON(ORTH,TRAU,BIO,SG)  Left 1 Implanted    Surgeons: Surgeon(s): Huel Cote, MD  Anesthesia: Regional    Estimated Blood Loss: See anesthesia record  Complications: None  Condition to PACU: Stable  Benancio Deeds, MD 06/08/2023 5:00 PM

## 2023-06-08 NOTE — Anesthesia Procedure Notes (Signed)
Anesthesia Regional Block: Adductor canal block   Pre-Anesthetic Checklist: , timeout performed,  Correct Patient, Correct Site, Correct Laterality,  Correct Procedure, Correct Position, site marked,  Risks and benefits discussed,  Surgical consent,  Pre-op evaluation,  At surgeon's request and post-op pain management  Laterality: Left  Prep: chloraprep       Needles:  Injection technique: Single-shot  Needle Type: Stimiplex     Needle Length: 9cm  Needle Gauge: 22     Additional Needles:   Procedures:,,,, ultrasound used (permanent image in chart),,    Narrative:  Start time: 06/08/2023 3:57 PM End time: 06/08/2023 3:58 PM Injection made incrementally with aspirations every 5 mL.  Performed by: Personally  Anesthesiologist: Foye Deer, MD  Additional Notes: Patient consented for risk and benefits of nerve block including but not limited to nerve damage, failed block, bleeding and infection.  Patient voiced understanding.  Functioning IV was confirmed and monitors were applied.  Timeout done prior to procedure and prior to any sedation being given to the patient.  Patient confirmed procedure site prior to any sedation given to the patient. Sterile prep,hand hygiene and sterile gloves were used.  Minimal sedation used for procedure.  No paresthesia endorsed by patient during the procedure.  Negative aspiration and negative test dose prior to incremental administration of local anesthetic. The patient tolerated the procedure well with no immediate complications.

## 2023-06-08 NOTE — Transfer of Care (Signed)
Immediate Anesthesia Transfer of Care Note  Patient: Shelly Pearson  Procedure(s) Performed: LEFT OPEN REDUCTION INTERNAL FIXATION (ORIF) ANKLE FRACTURE (Left: Ankle)  Patient Location: PACU  Anesthesia Type:General  Level of Consciousness: awake, alert , and oriented  Airway & Oxygen Therapy: Patient Spontanous Breathing  Post-op Assessment: Report given to RN and Post -op Vital signs reviewed and stable  Post vital signs: Reviewed and stable  Last Vitals:  Vitals Value Taken Time  BP 129/74 06/08/23 1720  Temp    Pulse 69 06/08/23 1723  Resp 20 06/08/23 1723  SpO2 100 % 06/08/23 1723  Vitals shown include unfiled device data.  Last Pain:  Vitals:   06/08/23 1447  TempSrc: Temporal  PainSc: 0-No pain         Complications: No notable events documented.

## 2023-06-08 NOTE — Op Note (Signed)
   Date of Surgery: 06/08/2023  INDICATIONS: [name] is a 82 y.o.-year-old female with left ankle bimalleolar ankle fracture.  The risk and benefits of the procedure were discussed in detail and documented in the pre-operative evaluation.   PREOPERATIVE DIAGNOSIS: 1. Left bimalleolar ankle fracture   POSTOPERATIVE DIAGNOSIS: Same.  PROCEDURE: 1. Left ankle open reduction internal fixation bimalleolar fracture  SURGEON: Benancio Deeds MD  ASSISTANT: Kerby Less, ATC  ANESTHESIA:  general plus peripheral nerve block  IV FLUIDS AND URINE: See anesthesia record.  ANTIBIOTICS: Ancef  ESTIMATED BLOOD LOSS: 10 mL.  IMPLANTS:  Implant Name Type Inv. Item Serial No. Manufacturer Lot No. LRB No. Used Action  PLATE 1/3 TUBULAR 6H - [id number] Plate PLATE 1/3 TUBULAR 6H  ZIMMER [employer]ON(ORTH,TRAU,BIO,SG)  Left 1 Implanted  SCREW NLOCK 3.5X20 - [id number] Screw SCREW NLOCK 3.5X20  ZIMMER [employer]ON(ORTH,TRAU,BIO,SG)  Left 1 Implanted  SCREW NON-LOCK 3.5X10 - [id number] Screw SCREW NON-LOCK 3.5X10  ZIMMER [employer]ON(ORTH,TRAU,BIO,SG)  Left 3 Implanted  SCREW NLOCK ALPS 3.5X14 - [id number] Screw SCREW NLOCK ALPS 3.5X14  ZIMMER [employer]ON(ORTH,TRAU,BIO,SG)  Left 2 Implanted  SCREW ACE CAN 4.0 36M - [id number] Screw SCREW ACE CAN 4.0 36M  ZIMMER [employer]ON(ORTH,TRAU,BIO,SG)  Left 1 Implanted  SCREW ACE CAN 4.0 36M - [id number] Screw SCREW ACE CAN 4.0 36M  ZIMMER [employer]ON(ORTH,TRAU,BIO,SG)  Left 1 Implanted    DRAINS: None  CULTURES: None  COMPLICATIONS: none  DESCRIPTION OF PROCEDURE:   I identified the patient in the pre-operative holding area.  I marked the operative left ankle with my initials. I reviewed the risks and benefits of the proposed surgical intervention and the patient (and/or patient's guardian) wished to proceed.  Anesthesia was then performed with a regional block.  The patient was transferred to the operative suite and placed in the left position with all bony prominences padded.     SCDs  were placed on the non-operative lower extremity. Appropriate antibiotics was administered within 1 hour before incision. The operative extremity was then prepped and draped in standard fashion. A time out was performed confirming the correct extremity, correct patient and correct procedure.   A lateral incision over the fibula was made centered around the fracture. The incision was taken sharply down to bone. Full thickness anterior and posterior flaps were made. The fracture site was cleared of debris. Reduction clamps were used to mobilize the fracture ends and hold the fracture in place. Fluoroscopy was used to confirm proper alignment.  A 1/3 tubular plate was utilized for the reduction first place the buttress screw. The plate was secured with 3.41mm screws proximally and 2.7 screws distally. Fluoroscopy was used to confirm proper placement of screws.  Next, attention was turned to the medial side. The medial malleolus was anatomic. Two pins were placed from the distal aspect of the medial malleolus into the tibial bone with care not to penetrate the joint. The screws were overdrilled. Two 60mm screws were placed. Fluoroscopy was used to confirm placement.  The wounds were thoroughly irrigated. The incisions were closed with vertical mattress 3-0 nylons. The incisions were dressed with xeroform, 4x4s, webril, and bias. A boot was placed.     POSTOPERATIVE PLAN: She will be weight bearing as tolerated on the left leg with a boot. She will placed on aspirin for blood clot prevention. She will participate and in postop physical therapy immediatly postop.  Benancio Deeds, MD 5:00 PM

## 2023-06-08 NOTE — Anesthesia Preprocedure Evaluation (Addendum)
Anesthesia Evaluation  Patient identified by MRN, date of birth, ID band Patient awake    Reviewed: Allergy & Precautions, NPO status , Patient's Chart, lab work & pertinent test results  History of Anesthesia Complications Negative for: history of anesthetic complications  Airway Mallampati: III  TM Distance: <3 FB Neck ROM: full    Dental  (+) Chipped, Caps   Pulmonary neg shortness of breath, sleep apnea    Pulmonary exam normal        Cardiovascular Exercise Tolerance: Good hypertension, (-) angina (-) Past MI Normal cardiovascular exam     Neuro/Psych  Neuromuscular disease  negative psych ROS   GI/Hepatic Neg liver ROS,GERD  Controlled,,  Endo/Other  Hypothyroidism    Renal/GU      Musculoskeletal   Abdominal   Peds  Hematology negative hematology ROS (+)   Anesthesia Other Findings LEFT BIMALLEOLAR FRACTURE- Pt reports occasional sharp pains on top of foot that feels like a nerve pain. She has sensation to toes and 1-2nd interdigit.   Past Medical History: 03/02/2020: B12 deficiency No date: GERD (gastroesophageal reflux disease) No date: Hypertension No date: Seasonal allergies No date: Thyroid disease 12/10/2020: Urge incontinence of urine No date: Urgency incontinence  Past Surgical History: No date: ABDOMINAL HYSTERECTOMY No date: CESAREAN SECTION No date: CYSTOCELE REPAIR No date: ROTATOR CUFF REPAIR; Right     Comment:  2018     Reproductive/Obstetrics negative OB ROS                             Anesthesia Physical Anesthesia Plan  ASA: 2  Anesthesia Plan: General   Post-op Pain Management: Regional block*   Induction: Intravenous  PONV Risk Score and Plan: Dexamethasone, Ondansetron, Midazolam and Treatment may vary due to age or medical condition  Airway Management Planned: Natural Airway  Additional Equipment:   Intra-op Plan:   Post-operative  Plan:   Informed Consent: I have reviewed the patients History and Physical, chart, labs and discussed the procedure including the risks, benefits and alternatives for the proposed anesthesia with the patient or authorized representative who has indicated his/her understanding and acceptance.     Dental Advisory Given  Plan Discussed with: Anesthesiologist, CRNA and Surgeon  Anesthesia Plan Comments:        Anesthesia Quick Evaluation

## 2023-06-08 NOTE — H&P (Signed)
Chief Complaint: Bilateral ankle pain        History of Present Illness:      Shelly Pearson is a 82 y.o. female presenting today for evaluation of injuries to both ankles.  She reports that 2 days ago she missed a step while walking down stairs at home and both of her ankles twisted.  She was immediately not able to weight-bear on either ankle.  She was seen in the ED and left ankle was placed in a lower leg splint for her bimalleolar fracture.  Right ankle is in a short boot today for suspected sprain.  Today she reports pain at a 9/10.  Has been taking Tylenol and ibuprofen.  She is still nonweightbearing.  Pain has affected her ability to sleep at night.  ED also prescribed oxycodone which she still has but is not taking.     Surgical History:   None   PMH/PSH/Family History/Social History/Meds/Allergies:         Past Medical History:  Diagnosis Date   B12 deficiency 03/02/2020   GERD (gastroesophageal reflux disease)     Hypertension     Seasonal allergies     Thyroid disease     Urge incontinence of urine 12/10/2020   Urgency incontinence               Past Surgical History:  Procedure Laterality Date   ABDOMINAL HYSTERECTOMY       CESAREAN SECTION       CYSTOCELE REPAIR       ROTATOR CUFF REPAIR Right      2018        Social History         Socioeconomic History   Marital status: Married      Spouse name: Not on file   Number of children: Not on file   Years of education: Not on file   Highest education level: Bachelor's degree (e.g., BA, AB, BS)  Occupational History   Occupation: Retired  Tobacco Use   Smoking status: Never   Smokeless tobacco: Never  Vaping Use   Vaping status: Never Used  Substance and Sexual Activity   Alcohol use: Yes      Comment: very rare    Drug use: No   Sexual activity: Not Currently  Other Topics Concern   Not on file  Social History Narrative    Lives with husband    Right handed     1 cup tea every morning    Social Determinants of Health        Financial Resource Strain: Low Risk  (10/06/2022)    Overall Financial Resource Strain (CARDIA)     Difficulty of Paying Living Expenses: Not hard at all  Food Insecurity: No Food Insecurity (10/06/2022)    Hunger Vital Sign     Worried About Running Out of Food in the Last Year: Never true     Ran Out of Food in the Last Year: Never true  Transportation Needs: No Transportation Needs (10/06/2022)    PRAPARE - Therapist, art (Medical): No     Lack of Transportation (Non-Medical): No  Physical Activity: Insufficiently Active (10/06/2022)    Exercise Vital Sign     Days of Exercise per Week: 3 days  Minutes of Exercise per Session: 40 min  Stress: Stress Concern Present (10/06/2022)    [employer] of Occupational Health - Occupational Stress Questionnaire     Feeling of Stress : To some extent  Social Connections: Socially Integrated (10/06/2022)    Social Connection and Isolation Panel [NHANES]     Frequency of Communication with Friends and Family: Three times a week     Frequency of Social Gatherings with Friends and Family: Once a week     Attends Religious Services: More than 4 times per year     Active Member of [employer] or Organizations: Yes     Attends Engineer, structural: More than 4 times per year     Marital Status: Married         Family History  Problem Relation Age of Onset   Stroke Mother     Cancer Father     Cancer Sister     Cancer Brother          Allergies      Allergies  Allergen Reactions   Adhesive  [Tape] Rash            Current Outpatient Medications  Medication Sig Dispense Refill   acetaminophen (TYLENOL) 500 MG tablet Take 1 tablet (500 mg total) by mouth every 8 (eight) hours for 10 days. (Patient taking differently: Take 1,000 mg by mouth every 6 (six) hours as needed for moderate pain (pain score 4-6).) 30 tablet 0   aspirin EC 325 MG  tablet Take 1 tablet (325 mg total) by mouth daily. 14 tablet 0   ibuprofen (ADVIL) 800 MG tablet Take 1 tablet (800 mg total) by mouth every 8 (eight) hours for 10 days. Please take with food, please alternate with acetaminophen 30 tablet 0   atorvastatin (LIPITOR) 20 MG tablet Take 1 tablet (20 mg total) by mouth daily. 90 tablet 3   Biotin 10 MG CAPS Take 10 mg by mouth daily.       calcium carbonate (TUMS - DOSED IN MG ELEMENTAL CALCIUM) 500 MG chewable tablet Chew 1 tablet by mouth daily as needed for indigestion or heartburn.       ibuprofen (ADVIL) 200 MG tablet Take 600 mg by mouth every 8 (eight) hours as needed for moderate pain (pain score 4-6).       levothyroxine (SYNTHROID) 50 MCG tablet Take 1 tablet (50 mcg total) by mouth daily before breakfast. 90 tablet 3   losartan (COZAAR) 25 MG tablet Take 1 tablet (25 mg total) by mouth daily. 90 tablet 3   Multiple Vitamins-Minerals (MATURE ADULT CENTURY PO) Take 1 tablet by mouth daily.       oxyCODONE (ROXICODONE) 5 MG immediate release tablet Take 1 tablet (5 mg total) by mouth every 6 (six) hours as needed for up to 5 days for severe pain (pain score 7-10) or breakthrough pain (Pain not controlled with tylenol and ibuprofen). (Patient not taking: Reported on 06/07/2023) 20 tablet 0   Polyethyl Glycol-Propyl Glycol (SYSTANE OP) Place 2 drops into both eyes 3 (three) times daily.       trospium (SANCTURA) 20 MG tablet Take 1 tablet (20 mg total) by mouth at bedtime. 90 tablet 3      No current facility-administered medications for this visit.      Imaging Results (Last 48 hours)  No results found.     Review of Systems:   A ROS was performed including pertinent positives and negatives as documented in  the HPI.   Physical Exam :   Constitutional: NAD and appears stated age Neurological: Alert and oriented Psych: Appropriate affect and cooperative There were no vitals taken for this visit.    Comprehensive Musculoskeletal Exam:      Right ankle appears mildly swollen with tenderness over the lateral ankle ligaments.  Active ROM to 10 degrees dorsiflexion and 20 degrees plantarflexion.  Negative anterior drawer. Left ankle remains in splint.  All 5 toes are warm and well-perfused.  Neurosensory exam to the toes is intact.   Imaging:   Xray review from 06/05/2023 (right ankle 3 views, left ankle 3 views): Bimalleolar fracture of the left ankle with mild displacement of the medial malleolus and slight widening of the ankle mortise.  Likely prior and healed fracture of the right lateral malleolus without evidence of acute fracture or dislocation in the right ankle.     I personally reviewed and interpreted the radiographs.     Assessment:   82 y.o. female with evidence of a left ankle bimalleolar fracture after a fall 2 days ago.  She is neurovascularly intact.  Due to the unstable nature of this fracture I did discuss with her and her husband that this will likely require surgical fixation in order to allow for early weight bearing with an anatomic reduction.  This will allow her to begin weightbearing earlier as well as allow for proper healing.  I discussed the risk and benefits including limitations and associated rehab. After discussion she has elected for left ankle open reduction internal fixation.  Plan :     -Plan for left ankle open reduction internal fixation   After a lengthy discussion of treatment options, including risks, benefits, alternatives, complications of surgical and nonsurgical conservative options, the patient elected surgical repair.   The patient  is aware of the material risks  and complications including, but not limited to injury to adjacent structures, neurovascular injury, infection, numbness, bleeding, implant failure, thermal burns, stiffness, persistent pain, failure to heal, disease transmission from allograft, need for further surgery, dislocation, anesthetic risks, blood clots, risks of  death,and others. The probabilities of surgical success and failure discussed with patient given their particular co-morbidities.The time and nature of expected rehabilitation and recovery was discussed.The patient's questions were all answered preoperatively.  No barriers to understanding were noted. I explained the natural history of the disease process and Rx rationale.  I explained to the patient what I considered to be reasonable expectations given their personal situation.  The final treatment plan was arrived at through a shared patient decision making process model.      I personally saw and evaluated the patient, and participated in the management and treatment pla

## 2023-06-08 NOTE — Interval H&P Note (Signed)
History and Physical Interval Note:  06/08/2023 4:08 PM  Shelly Pearson  has presented today for surgery, with the diagnosis of LEFT BIMALLEOLAR FRACTURE.  The various methods of treatment have been discussed with the patient and family. After consideration of risks, benefits and other options for treatment, the patient has consented to  Procedure(s): LEFT OPEN REDUCTION INTERNAL FIXATION (ORIF) ANKLE FRACTURE (Left) as a surgical intervention.  The patient's history has been reviewed, patient examined, no change in status, stable for surgery.  I have reviewed the patient's chart and labs.  Questions were answered to the patient's satisfaction.     Huel Cote

## 2023-06-08 NOTE — Anesthesia Procedure Notes (Signed)
Anesthesia Regional Block: Popliteal block   Pre-Anesthetic Checklist: , timeout performed,  Correct Patient, Correct Site, Correct Laterality,  Correct Procedure, Correct Position, site marked,  Risks and benefits discussed,  Surgical consent,  Pre-op evaluation,  At surgeon's request and post-op pain management  Laterality: Left and Lower  Prep: chloraprep       Needles:  Injection technique: Single-shot  Needle Type: Stimiplex     Needle Length: 9cm  Needle Gauge: 22     Additional Needles:   Procedures:,,,, ultrasound used (permanent image in chart),,    Narrative:  Start time: 06/08/2023 3:58 PM End time: 06/08/2023 3:59 PM Injection made incrementally with aspirations every 5 mL.  Performed by: Personally  Anesthesiologist: Foye Deer, MD  Additional Notes: Patient consented for risk and benefits of nerve block including but not limited to nerve damage, failed block, bleeding and infection.  Patient voiced understanding.  Functioning IV was confirmed and monitors were applied.  Timeout done prior to procedure and prior to any sedation being given to the patient.  Patient confirmed procedure site prior to any sedation given to the patient. Sterile prep,hand hygiene and sterile gloves were used.  Minimal sedation used for procedure.  No paresthesia endorsed by patient during the procedure.  Negative aspiration and negative test dose prior to incremental administration of local anesthetic. The patient tolerated the procedure well with no immediate complications.

## 2023-06-09 ENCOUNTER — Encounter: Payer: Self-pay | Admitting: Orthopaedic Surgery

## 2023-06-09 NOTE — Anesthesia Postprocedure Evaluation (Signed)
Anesthesia Post Note  Patient: Shelly Pearson  Procedure(s) Performed: LEFT OPEN REDUCTION INTERNAL FIXATION (ORIF) ANKLE FRACTURE (Left: Ankle)  Patient location during evaluation: PACU Anesthesia Type: General Level of consciousness: awake and alert Pain management: pain level controlled Vital Signs Assessment: post-procedure vital signs reviewed and stable Respiratory status: spontaneous breathing, nonlabored ventilation and respiratory function stable Cardiovascular status: blood pressure returned to baseline and stable Postop Assessment: no apparent nausea or vomiting Anesthetic complications: no   No notable events documented.   Last Vitals:  Vitals:   06/08/23 1840 06/08/23 1845  BP:  131/65  Pulse: 68 73  Resp: 15 (!) 23  Temp:    SpO2: 98% 99%    Last Pain:  Vitals:   06/08/23 1835  TempSrc:   PainSc: 0-No pain                 Foye Deer

## 2023-06-10 ENCOUNTER — Other Ambulatory Visit: Payer: Self-pay

## 2023-06-10 ENCOUNTER — Encounter (HOSPITAL_BASED_OUTPATIENT_CLINIC_OR_DEPARTMENT_OTHER): Payer: Self-pay | Admitting: Physical Therapy

## 2023-06-10 ENCOUNTER — Ambulatory Visit (HOSPITAL_BASED_OUTPATIENT_CLINIC_OR_DEPARTMENT_OTHER): Payer: Medicare Other | Attending: Orthopaedic Surgery | Admitting: Physical Therapy

## 2023-06-10 DIAGNOSIS — S82842D Displaced bimalleolar fracture of left lower leg, subsequent encounter for closed fracture with routine healing: Secondary | ICD-10-CM | POA: Insufficient documentation

## 2023-06-10 DIAGNOSIS — M25672 Stiffness of left ankle, not elsewhere classified: Secondary | ICD-10-CM

## 2023-06-10 DIAGNOSIS — M25572 Pain in left ankle and joints of left foot: Secondary | ICD-10-CM | POA: Insufficient documentation

## 2023-06-10 DIAGNOSIS — W108XXD Fall (on) (from) other stairs and steps, subsequent encounter: Secondary | ICD-10-CM | POA: Diagnosis not present

## 2023-06-10 DIAGNOSIS — R262 Difficulty in walking, not elsewhere classified: Secondary | ICD-10-CM | POA: Insufficient documentation

## 2023-06-10 DIAGNOSIS — R600 Localized edema: Secondary | ICD-10-CM | POA: Insufficient documentation

## 2023-06-10 NOTE — Therapy (Signed)
OUTPATIENT PHYSICAL THERAPY EVALUATION   Patient Name: Shelly Pearson MRN: [id number] DOB:1941/05/19, 82 y.o., female Today's Date: 06/10/2023  END OF SESSION:  PT End of Session - 06/10/23 0953     Visit Number 1    Number of Visits 21    Date for PT Re-Evaluation 08/19/23    Authorization Type MCR    Progress Note Due on Visit 10    PT Start Time 0910    PT Stop Time 0950    PT Time Calculation (min) 40 min    Activity Tolerance Patient tolerated treatment well    Behavior During Therapy [facility] for tasks assessed/performed             Past Medical History:  Diagnosis Date   B12 deficiency 03/02/2020   GERD (gastroesophageal reflux disease)    Hypertension    Seasonal allergies    Thyroid disease    Urge incontinence of urine 12/10/2020   Urgency incontinence    Past Surgical History:  Procedure Laterality Date   ABDOMINAL HYSTERECTOMY     CESAREAN SECTION     CYSTOCELE REPAIR     ORIF ANKLE FRACTURE Left 06/08/2023   Procedure: LEFT OPEN REDUCTION INTERNAL FIXATION (ORIF) ANKLE FRACTURE;  Surgeon: Huel Cote, MD;  Location: [facility] ORS;  Service: Orthopedics;  Laterality: Left;   ROTATOR CUFF REPAIR Right    2018   Patient Active Problem List   Diagnosis Date Noted   Bimalleolar ankle fracture, left, closed, initial encounter 06/08/2023   Onychomycosis 10/07/2022   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), wet (HCC) 05/24/2022   Decreased grip strength of right hand 09/16/2021   Gait disturbance 07/12/2021   Mild cognitive impairment 07/12/2021   Thyroid goiter 12/10/2020   Mixed hyperlipidemia 12/10/2020   Degenerative cervical spinal stenosis, mild C4-C5 and C5-C6 12/10/2020   Lumbar degenerative disc disease, L4-L5 with left lateral recess stenosis 12/10/2020   Personal history of fall 09/01/2020   Congenital cavus deformity of both feet 03/02/2020   Metatarsalgia of both feet 03/02/2020   Hand arthritis 03/02/2020   Arthritis of carpometacarpal (CMC)  joint of both thumbs 09/19/2019   Overactive bladder 09/19/2019   Sciatica 04/26/2019   Paresthesia of both feet 02/04/2019   Epidermal cyst 04/17/2018   Allergic rhinitis 06/23/2015   Eczema 06/23/2015   Gastro-esophageal reflux disease without esophagitis 06/23/2015   Essential hypertension 06/23/2015   Hypothyroidism 06/23/2015     REFERRING PROVIDER: Benancio Deeds, MD   REFERRING DIAG:  [phone] (ICD-10-CM) - Bimalleolar ankle fracture, left, closed, initial encounter     S/p ORIF L ankle bimalleolar fx  Rationale for Evaluation and Treatment: Rehabilitation  THERAPY DIAG:  Pain in left ankle and joints of left foot  Stiffness of left ankle, not elsewhere classified  Localized edema  Difficulty in walking, not elsewhere classified  ONSET DATE: DOS 06/08/23   SUBJECTIVE:  SUBJECTIVE STATEMENT: I was bringing stuff down the stairs for Christmas and thought I was on the last step but I was not. Rt ankle is also sprained but I can put weight on it.   PERTINENT HISTORY:  History of mild cognitive impairment, paresthesia of both feet and sciatica in history.  Patient reports memory limitations  PAIN:  Are you having pain? No  PRECAUTIONS:  None  RED FLAGS: None   WEIGHT BEARING RESTRICTIONS:  Yes WBAT with boot  FALLS:  Has patient fallen in last 6 months? Yes. Number of falls 1-this was the cause of the injury  LIVING ENVIRONMENT: Lives with: lives with their family and lives with their spouse   OCCUPATION:  Retired  PLOF:  Independent  PATIENT GOALS:  Return to walking, gardening, and other prior level of function    OBJECTIVE:  Note: Objective measures were completed at Evaluation unless otherwise noted.  PATIENT SURVEYS:  FOTO 40    EDEMA:  Yes:  Significant and deep edema in forefoot as a result of postop wrapping     TREATMENT:                                                                                                                               DATE: 12/7 EVAL Bandage change- no s/s of infection Edema mob from foot SLR Sidelying hip abd Prone hip ext Seated HSS     PATIENT EDUCATION:  Education details: Anatomy of condition, POC, HEP, exercise form/rationale  Person educated: Patient and Spouse Education method: Explanation, Demonstration, Tactile cues, Verbal cues, and Handouts Education comprehension: verbalized understanding, returned demonstration, verbal cues required, tactile cues required, and needs further education  HOME EXERCISE PROGRAM: [id number]   ASSESSMENT:  CLINICAL IMPRESSION: Patient is a 82y.o. female who was seen today for physical therapy evaluation and treatment for status post left ankle bimalleolar fracture and ORIF.     REHAB POTENTIAL: Good  CLINICAL DECISION MAKING: Stable/uncomplicated  EVALUATION COMPLEXITY: Low   GOALS: Goals reviewed with patient? Yes  SHORT TERM GOALS: Target date: July 01, 2023  Ankle active dorsiflexion to 5 degrees Baseline: Shy of 0 at evaluation Goal status: INITIAL  2.  Able to stand with full weightbearing through foot pain less than or equal to 3 out of 10 Baseline: Unable to evaluation Goal status: INITIAL    LONG TERM GOALS: Target date: Plan of care date  Meet Foto goal Baseline: See objective Goal status: INITIAL  2.  Able to demonstrate proper ambulation pattern without pain greater than 3 out of 10 for household distances Baseline:  Goal status: INITIAL  3.  Ankle dorsiflexion to 10 degrees Baseline:  Goal status: INITIAL  4.  Able to navigate stairs ascending and descending without compensation Baseline:  Goal status: INITIAL    PLAN:  PT FREQUENCY: 1-2x/week  PT DURATION: Plan of care date  PLANNED  INTERVENTIONS: 97164- PT Re-evaluation, 97110-Therapeutic exercises, 97530- Therapeutic activity, 97112-  Neuromuscular re-education, [phone]- Self Care, 60454- Manual therapy, [phone]- Gait training, [phone]- Aquatic Therapy, [phone]- Electrical stimulation (unattended), [phone]- Vasopneumatic device, Patient/Family education, Balance training, Stair training, Taping, Dry Needling, Joint mobilization, Spinal mobilization, Scar mobilization, Cryotherapy, and Moist heat.  PLAN FOR NEXT SESSION: Review hip home exercise program, ankle range of motion  Talayah Picardi C. Dysen Edmondson PT, DPT 06/10/23 12:40 PM

## 2023-06-12 ENCOUNTER — Telehealth (HOSPITAL_BASED_OUTPATIENT_CLINIC_OR_DEPARTMENT_OTHER): Payer: Self-pay | Admitting: Orthopaedic Surgery

## 2023-06-12 NOTE — Telephone Encounter (Signed)
Patient states that the her never block has not wore off and she was told that  it may ware off by now and she wants know if this is normal

## 2023-06-13 ENCOUNTER — Ambulatory Visit: Payer: Medicare Other | Admitting: Family Medicine

## 2023-06-13 ENCOUNTER — Telehealth: Payer: Self-pay

## 2023-06-13 NOTE — Telephone Encounter (Signed)
Called and advised pt. She stated understanding  

## 2023-06-13 NOTE — Telephone Encounter (Signed)
Patient calling back again checking on status of last call

## 2023-06-14 ENCOUNTER — Ambulatory Visit (HOSPITAL_BASED_OUTPATIENT_CLINIC_OR_DEPARTMENT_OTHER): Payer: Medicare Other | Admitting: Physical Therapy

## 2023-06-14 DIAGNOSIS — M25572 Pain in left ankle and joints of left foot: Secondary | ICD-10-CM | POA: Diagnosis not present

## 2023-06-14 DIAGNOSIS — R262 Difficulty in walking, not elsewhere classified: Secondary | ICD-10-CM

## 2023-06-14 DIAGNOSIS — M25672 Stiffness of left ankle, not elsewhere classified: Secondary | ICD-10-CM

## 2023-06-14 DIAGNOSIS — R600 Localized edema: Secondary | ICD-10-CM

## 2023-06-14 DIAGNOSIS — S82842D Displaced bimalleolar fracture of left lower leg, subsequent encounter for closed fracture with routine healing: Secondary | ICD-10-CM | POA: Diagnosis not present

## 2023-06-14 NOTE — Therapy (Signed)
OUTPATIENT PHYSICAL THERAPY TREATMENT   Patient Name: Shelly Pearson MRN: [id number] DOB:[birth year], 82 y.o., female Today's Date: 06/15/2023  END OF SESSION:  PT End of Session - 06/14/23 1634     Visit Number 2    Number of Visits 21    Date for PT Re-Evaluation 08/19/23    Authorization Type MCR    PT Start Time 1623    PT Stop Time 1710    PT Time Calculation (min) 47 min    Activity Tolerance Patient tolerated treatment well    Behavior During Therapy [facility] for tasks assessed/performed              Past Medical History:  Diagnosis Date   B12 deficiency 03/02/2020   GERD (gastroesophageal reflux disease)    Hypertension    Seasonal allergies    Thyroid disease    Urge incontinence of urine 12/10/2020   Urgency incontinence    Past Surgical History:  Procedure Laterality Date   ABDOMINAL HYSTERECTOMY     CESAREAN SECTION     CYSTOCELE REPAIR     ORIF ANKLE FRACTURE Left 06/08/2023   Procedure: LEFT OPEN REDUCTION INTERNAL FIXATION (ORIF) ANKLE FRACTURE;  Surgeon: Huel Cote, MD;  Location: [facility] ORS;  Service: Orthopedics;  Laterality: Left;   ROTATOR CUFF REPAIR Right    2018   Patient Active Problem List   Diagnosis Date Noted   Bimalleolar ankle fracture, left, closed, initial encounter 06/08/2023   Onychomycosis 10/07/2022   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), wet (HCC) 05/24/2022   Decreased grip strength of right hand 09/16/2021   Gait disturbance 07/12/2021   Mild cognitive impairment 07/12/2021   Thyroid goiter 12/10/2020   Mixed hyperlipidemia 12/10/2020   Degenerative cervical spinal stenosis, mild C4-C5 and C5-C6 12/10/2020   Lumbar degenerative disc disease, L4-L5 with left lateral recess stenosis 12/10/2020   Personal history of fall 09/01/2020   Congenital cavus deformity of both feet 03/02/2020   Metatarsalgia of both feet 03/02/2020   Hand arthritis 03/02/2020   Arthritis of carpometacarpal (CMC) joint of both thumbs 09/19/2019    Overactive bladder 09/19/2019   Sciatica 04/26/2019   Paresthesia of both feet 02/04/2019   Epidermal cyst 04/17/2018   Allergic rhinitis 06/23/2015   Eczema 06/23/2015   Gastro-esophageal reflux disease without esophagitis 06/23/2015   Essential hypertension 06/23/2015   Hypothyroidism 06/23/2015     REFERRING PROVIDER: Benancio Deeds, MD   REFERRING DIAG:  [phone] (ICD-10-CM) - Bimalleolar ankle fracture, left, closed, initial encounter     S/p ORIF L ankle bimalleolar fx  Rationale for Evaluation and Treatment: Rehabilitation  THERAPY DIAG:  Pain in left ankle and joints of left foot  Stiffness of left ankle, not elsewhere classified  Localized edema  Difficulty in walking, not elsewhere classified  ONSET DATE: DOS 06/08/23   SUBJECTIVE:  SUBJECTIVE STATEMENT: Pt is 6 days s/p L ankle ORIF bimalleolar fx.  Pt states she still has the effects of the nerve block and can't feel much.  Pt states she hasn't been doing her exercises primarily due to the continued effects of the nerve block.  Pt states she is not having pain.  She denies any adverse effects after prior Rx.   Pt states she also sprained her R ankle when she injured her L ankle.    PERTINENT HISTORY:  History of mild cognitive impairment, paresthesia of both feet and sciatica in history.  Patient reports memory limitations  PAIN:  Are you having pain? No  PRECAUTIONS:  None  RED FLAGS: None   WEIGHT BEARING RESTRICTIONS:  Yes WBAT with boot  FALLS:  Has patient fallen in last 6 months? Yes. Number of falls 1-this was the cause of the injury  LIVING ENVIRONMENT: Lives with: lives with their family and lives with their spouse   OCCUPATION:  Retired  PLOF:  Independent  PATIENT GOALS:  Return to  walking, gardening, and other prior level of function    OBJECTIVE:  Note: Objective measures were completed at Evaluation unless otherwise noted.     TREATMENT:                                                                                                                                PT doffed her boot and changed her bandages.  PT removed old tegaderm and gauze and applied new gauze and tegaderm over incisions. PT assessed sensation in distal R LE including foot.  Pt could feel palpation t/o R distal LE and foot. Pt attempted to wiggle toes and had very minimal movement.  She states that was more movement than she had earlier today.   Pt performed quad sets in longsitting in boot with 5 sec hold approx 10-15 reps  See below for pt education   PATIENT EDUCATION:  Education details:  PT answered pt's questions.  Dressings, relevant anatomy, post op limitations/restrictions, compliance with wearing boot, POC, exercise form Person educated: Patient and Spouse Education method: Explanation, Demonstration, Verbal cues Education comprehension:  verbalized understanding, returned demonstration, verbal cues required, and needs further education  HOME EXERCISE PROGRAM: [id number]   ASSESSMENT:  CLINICAL IMPRESSION: Patient presents to Rx in a W/C.  PT changed her bandages.  Pt had dried blood on lateral xerform gauze more distally and had no abnormal drainage.  Pt had no signs of infection.  PT educated pt concerning dressings.  Pt had questions concerning nerve block and continued effects.  PT assessed sensation and pt could feel palpation t/o R distal LE including foot.  Some areas had decreased sensation though pt could still feel it.  Pt had more sensation to palpation in R medial distal LE.  Pt's sensation is improving as she stated she could feel it more now than she could earlier today.  Pt had very minimal  movement with toe AROM though states that was improving.  Pt responded well  to Rx having no c/o's and no pain after Rx.  She should benefit from cont PT services per protocol to address impairments and improve function.      REHAB POTENTIAL: Good  CLINICAL DECISION MAKING: Stable/uncomplicated  EVALUATION COMPLEXITY: Low   GOALS: Goals reviewed with patient? Yes  SHORT TERM GOALS: Target date: July 01, 2023  Ankle active dorsiflexion to 5 degrees Baseline: Shy of 0 at evaluation Goal status: INITIAL  2.  Able to stand with full weightbearing through foot pain less than or equal to 3 out of 10 Baseline: Unable to evaluation Goal status: INITIAL    LONG TERM GOALS: Target date: Plan of care date  Meet Foto goal Baseline: See objective Goal status: INITIAL  2.  Able to demonstrate proper ambulation pattern without pain greater than 3 out of 10 for household distances Baseline:  Goal status: INITIAL  3.  Ankle dorsiflexion to 10 degrees Baseline:  Goal status: INITIAL  4.  Able to navigate stairs ascending and descending without compensation Baseline:  Goal status: INITIAL    PLAN:  PT FREQUENCY: 1-2x/week  PT DURATION: Plan of care date  PLANNED INTERVENTIONS: 97164- PT Re-evaluation, 97110-Therapeutic exercises, 97530- Therapeutic activity, 97112- Neuromuscular re-education, 97535- Self Care, 13244- Manual therapy, [phone]- Gait training, [phone]- Aquatic Therapy, 97014- Electrical stimulation (unattended), 97016- Vasopneumatic device, Patient/Family education, Balance training, Stair training, Taping, Dry Needling, Joint mobilization, Spinal mobilization, Scar mobilization, Cryotherapy, and Moist heat.  PLAN FOR NEXT SESSION:  Cont per ankle ORIF protocol and MD orders.    Audie Clear III PT, DPT 06/15/23 11:44 PM

## 2023-06-15 ENCOUNTER — Encounter (HOSPITAL_BASED_OUTPATIENT_CLINIC_OR_DEPARTMENT_OTHER): Payer: Self-pay | Admitting: Physical Therapy

## 2023-06-21 ENCOUNTER — Ambulatory Visit (HOSPITAL_BASED_OUTPATIENT_CLINIC_OR_DEPARTMENT_OTHER): Payer: Medicare Other | Admitting: Physical Therapy

## 2023-06-21 ENCOUNTER — Encounter (HOSPITAL_BASED_OUTPATIENT_CLINIC_OR_DEPARTMENT_OTHER): Payer: Self-pay | Admitting: Physical Therapy

## 2023-06-21 ENCOUNTER — Ambulatory Visit (HOSPITAL_BASED_OUTPATIENT_CLINIC_OR_DEPARTMENT_OTHER): Payer: Medicare Other | Admitting: Orthopaedic Surgery

## 2023-06-21 ENCOUNTER — Ambulatory Visit (HOSPITAL_BASED_OUTPATIENT_CLINIC_OR_DEPARTMENT_OTHER): Payer: Medicare Other

## 2023-06-21 DIAGNOSIS — Z4789 Encounter for other orthopedic aftercare: Secondary | ICD-10-CM | POA: Diagnosis not present

## 2023-06-21 DIAGNOSIS — R262 Difficulty in walking, not elsewhere classified: Secondary | ICD-10-CM | POA: Diagnosis not present

## 2023-06-21 DIAGNOSIS — S82842A Displaced bimalleolar fracture of left lower leg, initial encounter for closed fracture: Secondary | ICD-10-CM | POA: Diagnosis not present

## 2023-06-21 DIAGNOSIS — M25572 Pain in left ankle and joints of left foot: Secondary | ICD-10-CM

## 2023-06-21 DIAGNOSIS — M25672 Stiffness of left ankle, not elsewhere classified: Secondary | ICD-10-CM

## 2023-06-21 DIAGNOSIS — R600 Localized edema: Secondary | ICD-10-CM | POA: Diagnosis not present

## 2023-06-21 DIAGNOSIS — S82842D Displaced bimalleolar fracture of left lower leg, subsequent encounter for closed fracture with routine healing: Secondary | ICD-10-CM | POA: Diagnosis not present

## 2023-06-21 NOTE — Progress Notes (Signed)
Post Operative Evaluation    Procedure/Date of Surgery: Left ankle open reduction internal fixation 12/5  Interval History:    Presents today 2-week status post left ankle open reduction internal fixation.  Overall she is doing very well.  She has been nonweightbearing in a boot.  She has been working with physical therapy.  She will be planning to transition this to [facility].  She does endorse some pain in the midfoot.   PMH/PSH/Family History/Social History/Meds/Allergies:    Past Medical History:  Diagnosis Date   B12 deficiency 03/02/2020   GERD (gastroesophageal reflux disease)    Hypertension    Seasonal allergies    Thyroid disease    Urge incontinence of urine 12/10/2020   Urgency incontinence    Past Surgical History:  Procedure Laterality Date   ABDOMINAL HYSTERECTOMY     CESAREAN SECTION     CYSTOCELE REPAIR     ORIF ANKLE FRACTURE Left 06/08/2023   Procedure: LEFT OPEN REDUCTION INTERNAL FIXATION (ORIF) ANKLE FRACTURE;  Surgeon: Huel Cote, MD;  Location: [facility] ORS;  Service: Orthopedics;  Laterality: Left;   ROTATOR CUFF REPAIR Right    2018   Social History   Socioeconomic History   Marital status: Married    Spouse name: Not on file   Number of children: Not on file   Years of education: Not on file   Highest education level: Bachelor's degree (e.g., BA, AB, BS)  Occupational History   Occupation: Retired  Tobacco Use   Smoking status: Never   Smokeless tobacco: Never  Vaping Use   Vaping status: Never Used  Substance and Sexual Activity   Alcohol use: Yes    Comment: very rare    Drug use: No   Sexual activity: Not Currently  Other Topics Concern   Not on file  Social History Narrative   Lives with husband   Right handed   1 cup tea every morning   Social Drivers of Health   Financial Resource Strain: Low Risk  (10/06/2022)   Overall Financial Resource Strain (CARDIA)    Difficulty of Paying Living  Expenses: Not hard at all  Food Insecurity: No Food Insecurity (10/06/2022)   Hunger Vital Sign    Worried About Running Out of Food in the Last Year: Never true    Ran Out of Food in the Last Year: Never true  Transportation Needs: No Transportation Needs (10/06/2022)   PRAPARE - Administrator, Civil Service (Medical): No    Lack of Transportation (Non-Medical): No  Physical Activity: Insufficiently Active (10/06/2022)   Exercise Vital Sign    Days of Exercise per Week: 3 days    Minutes of Exercise per Session: 40 min  Stress: Stress Concern Present (10/06/2022)   [employer] of Occupational Health - Occupational Stress Questionnaire    Feeling of Stress : To some extent  Social Connections: Socially Integrated (10/06/2022)   Social Connection and Isolation Panel [NHANES]    Frequency of Communication with Friends and Family: Three times a week    Frequency of Social Gatherings with Friends and Family: Once a week    Attends Religious Services: More than 4 times per year    Active Member of [employer] or Organizations: Yes    Attends Banker Meetings: More than 4 times  per year    Marital Status: Married   Family History  Problem Relation Age of Onset   Stroke Mother    Cancer Father    Cancer Sister    Cancer Brother    Allergies  Allergen Reactions   Adhesive  [Tape] Rash   Current Outpatient Medications  Medication Sig Dispense Refill   aspirin EC 325 MG tablet Take 1 tablet (325 mg total) by mouth daily. 14 tablet 0   atorvastatin (LIPITOR) 20 MG tablet Take 1 tablet (20 mg total) by mouth daily. 90 tablet 3   Biotin 10 MG CAPS Take 10 mg by mouth daily.     calcium carbonate (TUMS - DOSED IN MG ELEMENTAL CALCIUM) 500 MG chewable tablet Chew 1 tablet by mouth daily as needed for indigestion or heartburn.     ibuprofen (ADVIL) 200 MG tablet Take 600 mg by mouth every 8 (eight) hours as needed for moderate pain (pain score 4-6).     levothyroxine  (SYNTHROID) 50 MCG tablet Take 1 tablet (50 mcg total) by mouth daily before breakfast. 90 tablet 3   losartan (COZAAR) 25 MG tablet Take 1 tablet (25 mg total) by mouth daily. 90 tablet 3   Multiple Vitamins-Minerals (MATURE ADULT CENTURY PO) Take 1 tablet by mouth daily.     Polyethyl Glycol-Propyl Glycol (SYSTANE OP) Place 2 drops into both eyes 3 (three) times daily.     trospium (SANCTURA) 20 MG tablet Take 1 tablet (20 mg total) by mouth at bedtime. 90 tablet 3   No current facility-administered medications for this visit.   No results found.  Review of Systems:   A ROS was performed including pertinent positives and negatives as documented in the HPI.   Musculoskeletal Exam:    There were no vitals taken for this visit.  Left ankle incisions are well-appearing without erythema or drainage.  There is still trace swelling about the ankle.  Range of motion is 10 degrees dorsi and plantarflexion.  Neurosensory exam is intact  Imaging:    3 views left ankle: Status post open reduction internal fixation overall doing well without complication  I personally reviewed and interpreted the radiographs.   Assessment:   2 weeks status post left ankle open reduction internal fixation without evidence of complication.  This time she will advance her weightbearing as tolerated in a boot.  I will plan to see her back in 4 weeks.  I did advise that she may use a walker and/or cane during this time to progress her weightbearing.  Will plan to transition her therapy to [facility].  Plan :    -Return to clinic 4 weeks for reassessment      I personally saw and evaluated the patient, and participated in the management and treatment plan.  Huel Cote, MD Attending Physician, Orthopedic Surgery  This document was dictated using Dragon voice recognition software. A reasonable attempt at proof reading has been made to minimize errors.

## 2023-06-21 NOTE — Addendum Note (Signed)
Addended by: Jeanella Cara on: 06/21/2023 12:14 PM   Modules accepted: Orders

## 2023-06-21 NOTE — Therapy (Signed)
OUTPATIENT PHYSICAL THERAPY TREATMENT   Patient Name: Shelly Pearson MRN: [id number] DOB:February 22, 1941, 82 y.o., female Today's Date: 06/21/2023  END OF SESSION:  PT End of Session - 06/21/23 1014     Visit Number 3    Number of Visits 21    Date for PT Re-Evaluation 08/19/23    Authorization Type MCR    Progress Note Due on Visit 10    PT Start Time 1020    PT Stop Time 1050    PT Time Calculation (min) 30 min    Activity Tolerance Patient tolerated treatment well    Behavior During Therapy [facility] for tasks assessed/performed              Past Medical History:  Diagnosis Date   B12 deficiency 03/02/2020   GERD (gastroesophageal reflux disease)    Hypertension    Seasonal allergies    Thyroid disease    Urge incontinence of urine 12/10/2020   Urgency incontinence    Past Surgical History:  Procedure Laterality Date   ABDOMINAL HYSTERECTOMY     CESAREAN SECTION     CYSTOCELE REPAIR     ORIF ANKLE FRACTURE Left 06/08/2023   Procedure: LEFT OPEN REDUCTION INTERNAL FIXATION (ORIF) ANKLE FRACTURE;  Surgeon: Huel Cote, MD;  Location: [facility] ORS;  Service: Orthopedics;  Laterality: Left;   ROTATOR CUFF REPAIR Right    2018   Patient Active Problem List   Diagnosis Date Noted   Bimalleolar ankle fracture, left, closed, initial encounter 06/08/2023   Onychomycosis 10/07/2022   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), wet (HCC) 05/24/2022   Decreased grip strength of right hand 09/16/2021   Gait disturbance 07/12/2021   Mild cognitive impairment 07/12/2021   Thyroid goiter 12/10/2020   Mixed hyperlipidemia 12/10/2020   Degenerative cervical spinal stenosis, mild C4-C5 and C5-C6 12/10/2020   Lumbar degenerative disc disease, L4-L5 with left lateral recess stenosis 12/10/2020   Personal history of fall 09/01/2020   Congenital cavus deformity of both feet 03/02/2020   Metatarsalgia of both feet 03/02/2020   Hand arthritis 03/02/2020   Arthritis of carpometacarpal (CMC)  joint of both thumbs 09/19/2019   Overactive bladder 09/19/2019   Sciatica 04/26/2019   Paresthesia of both feet 02/04/2019   Epidermal cyst 04/17/2018   Allergic rhinitis 06/23/2015   Eczema 06/23/2015   Gastro-esophageal reflux disease without esophagitis 06/23/2015   Essential hypertension 06/23/2015   Hypothyroidism 06/23/2015     REFERRING PROVIDER: Benancio Deeds, MD   REFERRING DIAG:  [phone] (ICD-10-CM) - Bimalleolar ankle fracture, left, closed, initial encounter     S/p ORIF L ankle bimalleolar fx  Rationale for Evaluation and Treatment: Rehabilitation  THERAPY DIAG:  Pain in left ankle and joints of left foot  Stiffness of left ankle, not elsewhere classified  Localized edema  Difficulty in walking, not elsewhere classified  ONSET DATE: DOS 06/08/23   SUBJECTIVE:  SUBJECTIVE STATEMENT: It has been swelling and is uncomfortable.   Pt states she also sprained her R ankle when she injured her L ankle.    PERTINENT HISTORY:  History of mild cognitive impairment, paresthesia of both feet and sciatica in history.  Patient reports memory limitations  PAIN:  Are you having pain? No  PRECAUTIONS:  None  RED FLAGS: None   WEIGHT BEARING RESTRICTIONS:  Yes WBAT with boot  FALLS:  Has patient fallen in last 6 months? Yes. Number of falls 1-this was the cause of the injury  LIVING ENVIRONMENT: Lives with: lives with their family and lives with their spouse   OCCUPATION:  Retired  PLOF:  Independent  PATIENT GOALS:  Return to walking, gardening, and other prior level of function    OBJECTIVE:  Note: Objective measures were completed at Evaluation unless otherwise noted.     TREATMENT:                                                                                                                                Treatment                            12/18:  Edema mob DF PROM Passive HS stretch Active HSS with ankle pumps Supine SLR without touching table Sidelying hip abd + flexion/ext Prone hip extension Gait in boot    PATIENT EDUCATION:  Education details:  PT answered pt's questions.  Dressings, relevant anatomy, post op limitations/restrictions, compliance with wearing boot, POC, exercise form Person educated: Patient and Spouse Education method: Explanation, Demonstration, Verbal cues Education comprehension:  verbalized understanding, returned demonstration, verbal cues required, and needs further education  HOME EXERCISE PROGRAM: [id number]   ASSESSMENT:  CLINICAL IMPRESSION: Able to obtain neutral DF with PROM today. Pitting edema in midfoot limiting digit mobility and creating soreness. Will  transfer to [place] at this time as she is closer to home there.     REHAB POTENTIAL: Good  CLINICAL DECISION MAKING: Stable/uncomplicated  EVALUATION COMPLEXITY: Low   GOALS: Goals reviewed with patient? Yes  SHORT TERM GOALS: Target date: July 01, 2023  Ankle active dorsiflexion to 5 degrees Baseline: Shy of 0 at evaluation Goal status: INITIAL  2.  Able to stand with full weightbearing through foot pain less than or equal to 3 out of 10 Baseline: Unable to evaluation Goal status: INITIAL    LONG TERM GOALS: Target date: Plan of care date  Meet Foto goal Baseline: See objective Goal status: INITIAL  2.  Able to demonstrate proper ambulation pattern without pain greater than 3 out of 10 for household distances Baseline:  Goal status: INITIAL  3.  Ankle dorsiflexion to 10 degrees Baseline:  Goal status: INITIAL  4.  Able to navigate stairs ascending and descending without compensation Baseline:  Goal status: INITIAL    PLAN:  PT FREQUENCY: 1-2x/week  PT DURATION:  Plan of care date  PLANNED  INTERVENTIONS: 97164- PT Re-evaluation, 97110-Therapeutic exercises, 97530- Therapeutic activity, 97112- Neuromuscular re-education, [phone]- Self Care, 63149- Manual therapy, [phone]- Gait training, [phone]- Aquatic Therapy, 97014- Electrical stimulation (unattended), 97016- Vasopneumatic device, Patient/Family education, Balance training, Stair training, Taping, Dry Needling, Joint mobilization, Spinal mobilization, Scar mobilization, Cryotherapy, and Moist heat.  PLAN FOR NEXT SESSION:  Cont per ankle ORIF protocol and MD orders.    Quintasha Gren C. Kyleen Villatoro PT, DPT 06/21/23 10:57 AM

## 2023-06-27 ENCOUNTER — Encounter (HOSPITAL_BASED_OUTPATIENT_CLINIC_OR_DEPARTMENT_OTHER): Payer: Medicare Other | Admitting: Physical Therapy

## 2023-06-29 ENCOUNTER — Ambulatory Visit: Payer: Medicare Other | Attending: Orthopaedic Surgery | Admitting: Physical Therapy

## 2023-06-29 DIAGNOSIS — R262 Difficulty in walking, not elsewhere classified: Secondary | ICD-10-CM | POA: Insufficient documentation

## 2023-06-29 DIAGNOSIS — M25572 Pain in left ankle and joints of left foot: Secondary | ICD-10-CM | POA: Diagnosis not present

## 2023-06-29 DIAGNOSIS — M25672 Stiffness of left ankle, not elsewhere classified: Secondary | ICD-10-CM | POA: Diagnosis not present

## 2023-06-29 DIAGNOSIS — R600 Localized edema: Secondary | ICD-10-CM | POA: Insufficient documentation

## 2023-06-29 NOTE — Therapy (Signed)
OUTPATIENT PHYSICAL THERAPY TREATMENT   Patient Name: Shelly Pearson MRN: [id number] DOB:07-24-1940, 82 y.o., female Today's Date: 06/29/2023  END OF SESSION:  PT End of Session - 06/29/23 1019     Visit Number 4    Number of Visits 21    Date for PT Re-Evaluation 08/19/23    Authorization Type MCR    Progress Note Due on Visit 10    PT Start Time 1018    PT Stop Time 1100    PT Time Calculation (min) 42 min    Activity Tolerance Patient tolerated treatment well    Behavior During Therapy [facility] for tasks assessed/performed               Past Medical History:  Diagnosis Date   B12 deficiency 03/02/2020   GERD (gastroesophageal reflux disease)    Hypertension    Seasonal allergies    Thyroid disease    Urge incontinence of urine 12/10/2020   Urgency incontinence    Past Surgical History:  Procedure Laterality Date   ABDOMINAL HYSTERECTOMY     CESAREAN SECTION     CYSTOCELE REPAIR     ORIF ANKLE FRACTURE Left 06/08/2023   Procedure: LEFT OPEN REDUCTION INTERNAL FIXATION (ORIF) ANKLE FRACTURE;  Surgeon: Huel Cote, MD;  Location: [facility] ORS;  Service: Orthopedics;  Laterality: Left;   ROTATOR CUFF REPAIR Right    2018   Patient Active Problem List   Diagnosis Date Noted   Bimalleolar ankle fracture, left, closed, initial encounter 06/08/2023   Onychomycosis 10/07/2022   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), wet (HCC) 05/24/2022   Decreased grip strength of right hand 09/16/2021   Gait disturbance 07/12/2021   Mild cognitive impairment 07/12/2021   Thyroid goiter 12/10/2020   Mixed hyperlipidemia 12/10/2020   Degenerative cervical spinal stenosis, mild C4-C5 and C5-C6 12/10/2020   Lumbar degenerative disc disease, L4-L5 with left lateral recess stenosis 12/10/2020   Personal history of fall 09/01/2020   Congenital cavus deformity of both feet 03/02/2020   Metatarsalgia of both feet 03/02/2020   Hand arthritis 03/02/2020   Arthritis of carpometacarpal  (CMC) joint of both thumbs 09/19/2019   Overactive bladder 09/19/2019   Sciatica 04/26/2019   Paresthesia of both feet 02/04/2019   Epidermal cyst 04/17/2018   Allergic rhinitis 06/23/2015   Eczema 06/23/2015   Gastro-esophageal reflux disease without esophagitis 06/23/2015   Essential hypertension 06/23/2015   Hypothyroidism 06/23/2015     REFERRING PROVIDER: Benancio Deeds, MD   REFERRING DIAG:  [phone] (ICD-10-CM) - Bimalleolar ankle fracture, left, closed, initial encounter     S/p ORIF L ankle bimalleolar fx  Rationale for Evaluation and Treatment: Rehabilitation  THERAPY DIAG:  Pain in left ankle and joints of left foot  Stiffness of left ankle, not elsewhere classified  Localized edema  Difficulty in walking, not elsewhere classified  ONSET DATE: DOS 06/08/23   SUBJECTIVE:  SUBJECTIVE STATEMENT: Pt reports nights are the hardest in terms of pain. Foot still gets swollen.   PERTINENT HISTORY:  History of mild cognitive impairment, paresthesia of both feet and sciatica in history.  Patient reports memory limitations  PAIN:  Are you having pain? No  PRECAUTIONS:  None  RED FLAGS: None   WEIGHT BEARING RESTRICTIONS:  Yes WBAT with boot  FALLS:  Has patient fallen in last 6 months? Yes. Number of falls 1-this was the cause of the injury  LIVING ENVIRONMENT: Lives with: lives with their family and lives with their spouse   OCCUPATION:  Retired  PLOF:  Independent  PATIENT GOALS:  Return to walking, gardening, and other prior level of function    OBJECTIVE:  Note: Objective measures were completed at Evaluation unless otherwise noted.     TREATMENT:                                                                                                                               06/29/23: Sitting  Heel slide 2x10  Towel scrunch 2x10  Toe yoga 2x10  Ankle inv/ev with towel 2x10  Ankle DF stretch with strap x30"  Heel raise 2x10  Ankle DF 2x10  Clamshell red TB 2x10  Knee ext red TB 2x10 Manual therapy  Gentle grade I to II ankle distraction and mobilization for DF  Ankle and toe PROM as tolerated   Treatment                            12/18:  Edema mob DF PROM Passive HS stretch Active HSS with ankle pumps Supine SLR without touching table Sidelying hip abd + flexion/ext Prone hip extension Gait in boot    PATIENT EDUCATION:  Education details:  Modified HEP Person educated: Patient and Spouse Education method: Programmer, multimedia, Facilities manager, Verbal cues Education comprehension:  verbalized understanding, returned demonstration, verbal cues required, and needs further education  HOME EXERCISE PROGRAM: [id number]   ASSESSMENT:  CLINICAL IMPRESSION: Continued to work on ankle mobility and toe/foot strengthening. Updated pt's HEP accordingly.     GOALS: Goals reviewed with patient? Yes  SHORT TERM GOALS: Target date: July 01, 2023  Ankle active dorsiflexion to 5 degrees Baseline: Shy of 0 at evaluation Goal status: INITIAL  2.  Able to stand with full weightbearing through foot pain less than or equal to 3 out of 10 Baseline: Unable to evaluation Goal status: INITIAL    LONG TERM GOALS: Target date: Plan of care date  Meet Foto goal Baseline: See objective Goal status: INITIAL  2.  Able to demonstrate proper ambulation pattern without pain greater than 3 out of 10 for household distances Baseline:  Goal status: INITIAL  3.  Ankle dorsiflexion to 10 degrees Baseline:  Goal status: INITIAL  4.  Able to navigate stairs ascending and descending without compensation Baseline:  Goal status: INITIAL    PLAN:  PT FREQUENCY: 1-2x/week  PT DURATION: Plan of care date  PLANNED INTERVENTIONS: 97164- PT  Re-evaluation, 97110-Therapeutic exercises, 97530- Therapeutic activity, 97112- Neuromuscular re-education, 97535- Self Care, 81191- Manual therapy, [phone]- Gait training, [phone]- Aquatic Therapy, 97014- Electrical stimulation (unattended), 97016- Vasopneumatic device, Patient/Family education, Balance training, Stair training, Taping, Dry Needling, Joint mobilization, Spinal mobilization, Scar mobilization, Cryotherapy, and Moist heat.  PLAN FOR NEXT SESSION:  Cont per ankle ORIF protocol and MD orders.  Will see Dr. Steward Drone 07/18/22.   Mary Hockey April Dell Ponto, PT, DPT 06/29/23 10:20 AM

## 2023-06-30 DIAGNOSIS — H353211 Exudative age-related macular degeneration, right eye, with active choroidal neovascularization: Secondary | ICD-10-CM | POA: Diagnosis not present

## 2023-07-06 ENCOUNTER — Encounter (HOSPITAL_BASED_OUTPATIENT_CLINIC_OR_DEPARTMENT_OTHER): Payer: Medicare Other

## 2023-07-06 ENCOUNTER — Encounter: Payer: Self-pay | Admitting: Physical Therapy

## 2023-07-06 ENCOUNTER — Ambulatory Visit: Payer: Medicare Other | Attending: Orthopaedic Surgery | Admitting: Physical Therapy

## 2023-07-06 DIAGNOSIS — M25672 Stiffness of left ankle, not elsewhere classified: Secondary | ICD-10-CM | POA: Diagnosis not present

## 2023-07-06 DIAGNOSIS — R600 Localized edema: Secondary | ICD-10-CM | POA: Diagnosis not present

## 2023-07-06 DIAGNOSIS — R262 Difficulty in walking, not elsewhere classified: Secondary | ICD-10-CM | POA: Diagnosis not present

## 2023-07-06 DIAGNOSIS — M25572 Pain in left ankle and joints of left foot: Secondary | ICD-10-CM | POA: Diagnosis not present

## 2023-07-06 DIAGNOSIS — S82842A Displaced bimalleolar fracture of left lower leg, initial encounter for closed fracture: Secondary | ICD-10-CM | POA: Diagnosis not present

## 2023-07-06 NOTE — Therapy (Signed)
 OUTPATIENT PHYSICAL THERAPY TREATMENT   Patient Name: Shelly Pearson MRN: [id number] DOB:1940/10/28, 83 y.o., female Today's Date: 07/06/2023  END OF SESSION:  PT End of Session - 07/06/23 1109     Visit Number 5    Date for PT Re-Evaluation 08/19/23    PT Start Time 1100    PT Stop Time 1140    PT Time Calculation (min) 40 min    Activity Tolerance Patient tolerated treatment well    Behavior During Therapy [facility] for tasks assessed/performed            Past Medical History:  Diagnosis Date   B12 deficiency 03/02/2020   GERD (gastroesophageal reflux disease)    Hypertension    Seasonal allergies    Thyroid  disease    Urge incontinence of urine 12/10/2020   Urgency incontinence    Past Surgical History:  Procedure Laterality Date   ABDOMINAL HYSTERECTOMY     CESAREAN SECTION     CYSTOCELE REPAIR     ORIF ANKLE FRACTURE Left 06/08/2023   Procedure: LEFT OPEN REDUCTION INTERNAL FIXATION (ORIF) ANKLE FRACTURE;  Surgeon: Genelle Standing, MD;  Location: [facility] ORS;  Service: Orthopedics;  Laterality: Left;   ROTATOR CUFF REPAIR Right    2018   Patient Active Problem List   Diagnosis Date Noted   Bimalleolar ankle fracture, left, closed, initial encounter 06/08/2023   Onychomycosis 10/07/2022   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), wet (HCC) 05/24/2022   Decreased grip strength of right hand 09/16/2021   Gait disturbance 07/12/2021   Mild cognitive impairment 07/12/2021   Thyroid  goiter 12/10/2020   Mixed hyperlipidemia 12/10/2020   Degenerative cervical spinal stenosis, mild C4-C5 and C5-C6 12/10/2020   Lumbar degenerative disc disease, L4-L5 with left lateral recess stenosis 12/10/2020   Personal history of fall 09/01/2020   Congenital cavus deformity of both feet 03/02/2020   Metatarsalgia of both feet 03/02/2020   Hand arthritis 03/02/2020   Arthritis of carpometacarpal (CMC) joint of both thumbs 09/19/2019   Overactive bladder 09/19/2019   Sciatica 04/26/2019    Paresthesia of both feet 02/04/2019   Epidermal cyst 04/17/2018   Allergic rhinitis 06/23/2015   Eczema 06/23/2015   Gastro-esophageal reflux disease without esophagitis 06/23/2015   Essential hypertension 06/23/2015   Hypothyroidism 06/23/2015     REFERRING PROVIDER: Standing LITTIE Genelle, MD   REFERRING DIAG:  [phone] (ICD-10-CM) - Bimalleolar ankle fracture, left, closed, initial encounter     S/p ORIF L ankle bimalleolar fx  Rationale for Evaluation and Treatment: Rehabilitation  THERAPY DIAG:  Pain in left ankle and joints of left foot  Stiffness of left ankle, not elsewhere classified  Localized edema  Difficulty in walking, not elsewhere classified  ONSET DATE: DOS 06/08/23   SUBJECTIVE:  SUBJECTIVE STATEMENT: Pt reports nights are the hardest in terms of pain. Foot still gets swollen.   PERTINENT HISTORY:  History of mild cognitive impairment, paresthesia of both feet and sciatica in history.  Patient reports memory limitations  PAIN:  Are you having pain? No  PRECAUTIONS:  None  RED FLAGS: None   WEIGHT BEARING RESTRICTIONS:  Yes WBAT with boot  FALLS:  Has patient fallen in last 6 months? Yes. Number of falls 1-this was the cause of the injury  LIVING ENVIRONMENT: Lives with: lives with their family and lives with their spouse   OCCUPATION:  Retired  PLOF:  Independent  PATIENT GOALS:  Return to walking, gardening, and other prior level of function    OBJECTIVE:  Note: Objective measures were completed at Evaluation unless otherwise noted.  TREATMENT:                                                                                                                              07/06/23 Seated L ankle AROM on physiodisc, 20 reps DF/PF, and inv/ever Towel  scrunches, inversion ever with towel Toe curls up and down Joint mobs in L foot and ankle, passive, gentle stretch into DF Scar mobilization Heel raises, 20 with no weight, then 20 more with 3# weight on knee. Standing weight shifts with BUE support on Rollator, controlled WB through LLE, no more than 25% Seated knee flex against 3# resistance Seated knee flexion against R tband-2 x 10 each, LLE Mini squats with boot on and Rollator for UE support x 10 reps  06/29/23: Sitting  Heel slide 2x10  Towel scrunch 2x10  Toe yoga 2x10  Ankle inv/ev with towel 2x10  Ankle DF stretch with strap x30  Heel raise 2x10  Ankle DF 2x10  Clamshell red TB 2x10  Knee ext red TB 2x10 Manual therapy  Gentle grade I to II ankle distraction and mobilization for DF  Ankle and toe PROM as tolerated   Treatment                            12/18:  Edema mob DF PROM Passive HS stretch Active HSS with ankle pumps Supine SLR without touching table Sidelying hip abd + flexion/ext Prone hip extension Gait in boot  PATIENT EDUCATION:  Education details:  Modified HEP Person educated: Patient and Spouse Education method: Programmer, Multimedia, Facilities Manager, Verbal cues Education comprehension:  verbalized understanding, returned demonstration, verbal cues required, and needs further education  HOME EXERCISE PROGRAM: [id number]  ASSESSMENT:  CLINICAL IMPRESSION: Patient reports she has been doing a little walking at home without the boot. She thought she was supposed to, but her ankle swelled a lot so she has limited the amount of walking without the boot. Educated her that the Dr did not indicate that she should start walking without the boot in his note and encouraged her to call him to clarify before continuing.  Progressed the strengthening exercises in sitting today and added mini squats for functional strengthening. Tolerated all without increased pain.  GOALS: Goals reviewed with patient? Yes  SHORT  TERM GOALS: Target date: July 01, 2023  Ankle active dorsiflexion to 5 degrees Baseline: Shy of 0 at evaluation Goal status: 07/06/23-10 dgrees met  2.  Able to stand with full weightbearing through foot pain less than or equal to 3 out of 10 Baseline: Unable to evaluation Goal status: INITIAL    LONG TERM GOALS: Target date: Plan of care date  Meet Foto goal Baseline: See objective Goal status: INITIAL  2.  Able to demonstrate proper ambulation pattern without pain greater than 3 out of 10 for household distances Baseline:  Goal status: INITIAL  3.  Ankle dorsiflexion to 10 degrees Baseline:  Goal status: INITIAL  4.  Able to navigate stairs ascending and descending without compensation Baseline:  Goal status: INITIAL    PLAN:  PT FREQUENCY: 1-2x/week  PT DURATION: Plan of care date  PLANNED INTERVENTIONS: 97164- PT Re-evaluation, 97110-Therapeutic exercises, 97530- Therapeutic activity, 97112- Neuromuscular re-education, 97535- Self Care, [id number]- Manual therapy, [phone]- Gait training, [phone]- Aquatic Therapy, 97014- Electrical stimulation (unattended), 97016- Vasopneumatic device, Patient/Family education, Balance training, Stair training, Taping, Dry Needling, Joint mobilization, Spinal mobilization, Scar mobilization, Cryotherapy, and Moist heat.  PLAN FOR NEXT SESSION:  Cont per ankle ORIF protocol and MD orders.  Will see Dr. Genelle 07/18/22.   Devere CHRISTELLA Mean, PT, DPT 07/06/23 12:55 PM

## 2023-07-11 ENCOUNTER — Encounter (HOSPITAL_BASED_OUTPATIENT_CLINIC_OR_DEPARTMENT_OTHER): Payer: Medicare Other

## 2023-07-13 ENCOUNTER — Encounter (HOSPITAL_BASED_OUTPATIENT_CLINIC_OR_DEPARTMENT_OTHER): Payer: Medicare Other

## 2023-07-13 ENCOUNTER — Encounter: Payer: Self-pay | Admitting: Physical Therapy

## 2023-07-13 ENCOUNTER — Ambulatory Visit: Payer: Medicare Other | Admitting: Physical Therapy

## 2023-07-13 DIAGNOSIS — M25572 Pain in left ankle and joints of left foot: Secondary | ICD-10-CM | POA: Diagnosis not present

## 2023-07-13 DIAGNOSIS — M25672 Stiffness of left ankle, not elsewhere classified: Secondary | ICD-10-CM

## 2023-07-13 DIAGNOSIS — R600 Localized edema: Secondary | ICD-10-CM

## 2023-07-13 DIAGNOSIS — R262 Difficulty in walking, not elsewhere classified: Secondary | ICD-10-CM

## 2023-07-13 DIAGNOSIS — S82842A Displaced bimalleolar fracture of left lower leg, initial encounter for closed fracture: Secondary | ICD-10-CM | POA: Diagnosis not present

## 2023-07-13 NOTE — Therapy (Signed)
 OUTPATIENT PHYSICAL THERAPY TREATMENT   Patient Name: Shelly Pearson MRN: [id number] DOB:[birth year], 83 y.o., female Today's Date: 07/13/2023  END OF SESSION:  PT End of Session - 07/13/23 1147     Visit Number 6    Date for PT Re-Evaluation 08/19/23    PT Start Time 1100    PT Stop Time 1150    PT Time Calculation (min) 50 min    Activity Tolerance Patient tolerated treatment well    Behavior During Therapy [facility] for tasks assessed/performed             Past Medical History:  Diagnosis Date   B12 deficiency 03/02/2020   GERD (gastroesophageal reflux disease)    Hypertension    Seasonal allergies    Thyroid  disease    Urge incontinence of urine 12/10/2020   Urgency incontinence    Past Surgical History:  Procedure Laterality Date   ABDOMINAL HYSTERECTOMY     CESAREAN SECTION     CYSTOCELE REPAIR     ORIF ANKLE FRACTURE Left 06/08/2023   Procedure: LEFT OPEN REDUCTION INTERNAL FIXATION (ORIF) ANKLE FRACTURE;  Surgeon: Genelle Standing, MD;  Location: [facility] ORS;  Service: Orthopedics;  Laterality: Left;   ROTATOR CUFF REPAIR Right    2018   Patient Active Problem List   Diagnosis Date Noted   Bimalleolar ankle fracture, left, closed, initial encounter 06/08/2023   Onychomycosis 10/07/2022   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), wet (HCC) 05/24/2022   Decreased grip strength of right hand 09/16/2021   Gait disturbance 07/12/2021   Mild cognitive impairment 07/12/2021   Thyroid  goiter 12/10/2020   Mixed hyperlipidemia 12/10/2020   Degenerative cervical spinal stenosis, mild C4-C5 and C5-C6 12/10/2020   Lumbar degenerative disc disease, L4-L5 with left lateral recess stenosis 12/10/2020   Personal history of fall 09/01/2020   Congenital cavus deformity of both feet 03/02/2020   Metatarsalgia of both feet 03/02/2020   Hand arthritis 03/02/2020   Arthritis of carpometacarpal (CMC) joint of both thumbs 09/19/2019   Overactive bladder 09/19/2019   Sciatica 04/26/2019    Paresthesia of both feet 02/04/2019   Epidermal cyst 04/17/2018   Allergic rhinitis 06/23/2015   Eczema 06/23/2015   Gastro-esophageal reflux disease without esophagitis 06/23/2015   Essential hypertension 06/23/2015   Hypothyroidism 06/23/2015     REFERRING PROVIDER: Standing LITTIE Genelle, MD   REFERRING DIAG:  [phone] (ICD-10-CM) - Bimalleolar ankle fracture, left, closed, initial encounter     S/p ORIF L ankle bimalleolar fx  Rationale for Evaluation and Treatment: Rehabilitation  THERAPY DIAG:  Pain in left ankle and joints of left foot  Stiffness of left ankle, not elsewhere classified  Localized edema  Difficulty in walking, not elsewhere classified  ONSET DATE: DOS 06/08/23   SUBJECTIVE:  SUBJECTIVE STATEMENT: Pt reports that she continues to have pain in her ankle, limited WB due to the pain. Has Dr appointment next Wed  PERTINENT HISTORY:  History of mild cognitive impairment, paresthesia of both feet and sciatica in history.  Patient reports memory limitations  PAIN:  Are you having pain? No  PRECAUTIONS:  None  RED FLAGS: None   WEIGHT BEARING RESTRICTIONS:  Yes WBAT with boot  FALLS:  Has patient fallen in last 6 months? Yes. Number of falls 1-this was the cause of the injury  LIVING ENVIRONMENT: Lives with: lives with their family and lives with their spouse   OCCUPATION:  Retired  PLOF:  Independent  PATIENT GOALS:  Return to walking, gardening, and other prior level of function  OBJECTIVE:  Note: Objective measures were completed at Evaluation unless otherwise noted.  TREATMENT:                                                                                                                              07/13/23 Seated L ankle AROM on physiodisc, 20 reps  DF/PF, and inv/ever, then clockwise and counter clockwise ROM x 10 each Gentle STM and joint mobs in R foot and ankle Gentle PROM for toes in flex and ext, PF and DF of ankle Active ankle ROM against light resistance, yellow Tband for ever, ball squeeze for inversion, PF with foot on floor, DF with 1# resistance, 2 x 10 each Seated HS curl with 20# resistance, BLE, 2 x 10 Attempted knee ext, but too heavy, so moved to mat with 3# weight on L 2 x 10 reps VASO to L ankle, 10 min, med pressure for pain and inflammation.  07/06/23 Seated L ankle AROM on physiodisc, 20 reps DF/PF, and inv/ever Towel scrunches, inversion ever with towel Toe curls up and down Joint mobs in L foot and ankle, passive, gentle stretch into DF Scar mobilization Heel raises, 20 with no weight, then 20 more with 3# weight on knee. Standing weight shifts with BUE support on Rollator, controlled WB through LLE, no more than 25% Seated knee flex against 3# resistance Seated knee flexion against R tband-2 x 10 each, LLE Mini squats with boot on and Rollator for UE support x 10 reps  06/29/23: Sitting  Heel slide 2x10  Towel scrunch 2x10  Toe yoga 2x10  Ankle inv/ev with towel 2x10  Ankle DF stretch with strap x30  Heel raise 2x10  Ankle DF 2x10  Clamshell red TB 2x10  Knee ext red TB 2x10 Manual therapy  Gentle grade I to II ankle distraction and mobilization for DF  Ankle and toe PROM as tolerated   Treatment                            12/18:  Edema mob DF PROM Passive HS stretch Active HSS with ankle pumps Supine SLR without touching table Sidelying hip abd +  flexion/ext Prone hip extension Gait in boot  PATIENT EDUCATION:  Education details:  Modified HEP Person educated: Patient and Spouse Education method: Explanation, Demonstration, Verbal cues Education comprehension:  verbalized understanding, returned demonstration, verbal cues required, and needs further education  HOME EXERCISE  PROGRAM: [id number]  ASSESSMENT:  CLINICAL IMPRESSION: Patient reports no new issues. She continues to have some pain and swelling, but they resolve overnight. Has not been walking without the boot. Progressed her ROM and strengthening exercises for her ankle, foot, and leg otday, with no problems. She has a Dr appointment next Wed, hopefully will get an update in her activities.  GOALS: Goals reviewed with patient? Yes  SHORT TERM GOALS: Target date: July 01, 2023  Ankle active dorsiflexion to 5 degrees Baseline: Shy of 0 at evaluation Goal status: 07/06/23-10 dgrees met  2.  Able to stand with full weightbearing through foot pain less than or equal to 3 out of 10 Baseline: Unable to evaluation Goal status: 07/13/23, pain up to 3-4 with boot on.    LONG TERM GOALS: Target date: Plan of care date  Meet Foto goal Baseline: See objective Goal status: INITIAL  2.  Able to demonstrate proper ambulation pattern without pain greater than 3 out of 10 for household distances Baseline:  Goal status: INITIAL  3.  Ankle dorsiflexion to 10 degrees Baseline:  Goal status: INITIAL  4.  Able to navigate stairs ascending and descending without compensation Baseline:  Goal status: INITIAL  PLAN:  PT FREQUENCY: 1-2x/week  PT DURATION: Plan of care date  PLANNED INTERVENTIONS: 97164- PT Re-evaluation, 97110-Therapeutic exercises, 97530- Therapeutic activity, 97112- Neuromuscular re-education, 97535- Self Care, [id number]- Manual therapy, [phone]- Gait training, [phone]- Aquatic Therapy, 97014- Electrical stimulation (unattended), 97016- Vasopneumatic device, Patient/Family education, Balance training, Stair training, Taping, Dry Needling, Joint mobilization, Spinal mobilization, Scar mobilization, Cryotherapy, and Moist heat.  PLAN FOR NEXT SESSION:  Cont per ankle ORIF protocol and MD orders.  Will see Dr. Genelle 07/18/22.   Devere CHRISTELLA Mean, PT, DPT 07/13/23 1:10 PM

## 2023-07-18 ENCOUNTER — Encounter (HOSPITAL_BASED_OUTPATIENT_CLINIC_OR_DEPARTMENT_OTHER): Payer: Medicare Other | Admitting: Physical Therapy

## 2023-07-19 ENCOUNTER — Ambulatory Visit (HOSPITAL_BASED_OUTPATIENT_CLINIC_OR_DEPARTMENT_OTHER): Payer: Medicare Other | Admitting: Orthopaedic Surgery

## 2023-07-19 ENCOUNTER — Ambulatory Visit (HOSPITAL_BASED_OUTPATIENT_CLINIC_OR_DEPARTMENT_OTHER): Payer: Medicare Other

## 2023-07-19 DIAGNOSIS — S82842A Displaced bimalleolar fracture of left lower leg, initial encounter for closed fracture: Secondary | ICD-10-CM | POA: Diagnosis not present

## 2023-07-19 DIAGNOSIS — Z9889 Other specified postprocedural states: Secondary | ICD-10-CM | POA: Diagnosis not present

## 2023-07-19 DIAGNOSIS — M7732 Calcaneal spur, left foot: Secondary | ICD-10-CM | POA: Diagnosis not present

## 2023-07-19 DIAGNOSIS — S8252XA Displaced fracture of medial malleolus of left tibia, initial encounter for closed fracture: Secondary | ICD-10-CM | POA: Diagnosis not present

## 2023-07-19 NOTE — Progress Notes (Signed)
 Post Operative Evaluation    Procedure/Date of Surgery: Left ankle open reduction internal fixation 12/5  Interval History:    Presents today 6 weeks status post the above surgery.  She has been able to walk with minimal assistive devices.  She has been in her cam boot as she is still experiencing some swelling.   PMH/PSH/Family History/Social History/Meds/Allergies:    Past Medical History:  Diagnosis Date   B12 deficiency 03/02/2020   GERD (gastroesophageal reflux disease)    Hypertension    Seasonal allergies    Thyroid  disease    Urge incontinence of urine 12/10/2020   Urgency incontinence    Past Surgical History:  Procedure Laterality Date   ABDOMINAL HYSTERECTOMY     CESAREAN SECTION     CYSTOCELE REPAIR     ORIF ANKLE FRACTURE Left 06/08/2023   Procedure: LEFT OPEN REDUCTION INTERNAL FIXATION (ORIF) ANKLE FRACTURE;  Surgeon: Wilhelmenia Harada, MD;  Location: [facility] ORS;  Service: Orthopedics;  Laterality: Left;   ROTATOR CUFF REPAIR Right    2018   Social History   Socioeconomic History   Marital status: Married    Spouse name: Not on file   Number of children: Not on file   Years of education: Not on file   Highest education level: Bachelor's degree (e.g., BA, AB, BS)  Occupational History   Occupation: Retired  Tobacco Use   Smoking status: Never   Smokeless tobacco: Never  Vaping Use   Vaping status: Never Used  Substance and Sexual Activity   Alcohol use: Yes    Comment: very rare    Drug use: No   Sexual activity: Not Currently  Other Topics Concern   Not on file  Social History Narrative   Lives with husband   Right handed   1 cup tea every morning   Social Drivers of Health   Financial Resource Strain: Low Risk  (10/06/2022)   Overall Financial Resource Strain (CARDIA)    Difficulty of Paying Living Expenses: Not hard at all  Food Insecurity: No Food Insecurity (10/06/2022)   Hunger Vital Sign    Worried About  Running Out of Food in the Last Year: Never true    Ran Out of Food in the Last Year: Never true  Transportation Needs: No Transportation Needs (10/06/2022)   PRAPARE - Administrator, Civil Service (Medical): No    Lack of Transportation (Non-Medical): No  Physical Activity: Insufficiently Active (10/06/2022)   Exercise Vital Sign    Days of Exercise per Week: 3 days    Minutes of Exercise per Session: 40 min  Stress: Stress Concern Present (10/06/2022)   [employer] of Occupational Health - Occupational Stress Questionnaire    Feeling of Stress : To some extent  Social Connections: Socially Integrated (10/06/2022)   Social Connection and Isolation Panel [NHANES]    Frequency of Communication with Friends and Family: Three times a week    Frequency of Social Gatherings with Friends and Family: Once a week    Attends Religious Services: More than 4 times per year    Active Member of [employer] or Organizations: Yes    Attends Engineer, structural: More than 4 times per year    Marital Status: Married   Family History  Problem Relation Age of Onset  Stroke Mother    Cancer Father    Cancer Sister    Cancer Brother    Allergies  Allergen Reactions   Adhesive  [Tape] Rash   Current Outpatient Medications  Medication Sig Dispense Refill   aspirin  EC 325 MG tablet Take 1 tablet (325 mg total) by mouth daily. 14 tablet 0   atorvastatin  (LIPITOR) 20 MG tablet Take 1 tablet (20 mg total) by mouth daily. 90 tablet 3   Biotin 10 MG CAPS Take 10 mg by mouth daily.     calcium  carbonate (TUMS - DOSED IN MG ELEMENTAL CALCIUM ) 500 MG chewable tablet Chew 1 tablet by mouth daily as needed for indigestion or heartburn.     ibuprofen  (ADVIL ) 200 MG tablet Take 600 mg by mouth every 8 (eight) hours as needed for moderate pain (pain score 4-6).     levothyroxine  (SYNTHROID ) 50 MCG tablet Take 1 tablet (50 mcg total) by mouth daily before breakfast. 90 tablet 3   losartan  (COZAAR )  25 MG tablet Take 1 tablet (25 mg total) by mouth daily. 90 tablet 3   Multiple Vitamins-Minerals (MATURE ADULT CENTURY PO) Take 1 tablet by mouth daily.     Polyethyl Glycol-Propyl Glycol (SYSTANE OP) Place 2 drops into both eyes 3 (three) times daily.     trospium  (SANCTURA ) 20 MG tablet Take 1 tablet (20 mg total) by mouth at bedtime. 90 tablet 3   No current facility-administered medications for this visit.   No results found.  Review of Systems:   A ROS was performed including pertinent positives and negatives as documented in the HPI.   Musculoskeletal Exam:    There were no vitals taken for this visit.  Left ankle incisions are well-appearing without erythema or drainage.  There is still trace swelling about the ankle.  Range of motion is 10 degrees dorsi and plantarflexion.  Neurosensory exam is intact  Imaging:    3 views left ankle: Status post open reduction internal fixation overall doing well without complication  I personally reviewed and interpreted the radiographs.   Assessment:   6 weeks status post left ankle open reduction internal fixation without evidence of complication.  At this time I did advise that she may continue to progress with longer walks and wean out of her cam boot as tolerated.  I would like her to continue to work on strengthening for the additional 6 weeks of physical therapy.  I will plan to see her back in 6 weeks for reassessment  Plan :    -Return to clinic 6 weeks for reassessment      I personally saw and evaluated the patient, and participated in the management and treatment plan.  Wilhelmenia Harada, MD Attending Physician, Orthopedic Surgery  This document was dictated using Dragon voice recognition software. A reasonable attempt at proof reading has been made to minimize errors.

## 2023-07-20 ENCOUNTER — Ambulatory Visit: Payer: Medicare Other | Admitting: Physical Therapy

## 2023-07-20 ENCOUNTER — Encounter: Payer: Self-pay | Admitting: Physical Therapy

## 2023-07-20 ENCOUNTER — Encounter (HOSPITAL_BASED_OUTPATIENT_CLINIC_OR_DEPARTMENT_OTHER): Payer: Medicare Other | Admitting: Physical Therapy

## 2023-07-20 DIAGNOSIS — R600 Localized edema: Secondary | ICD-10-CM | POA: Diagnosis not present

## 2023-07-20 DIAGNOSIS — M25672 Stiffness of left ankle, not elsewhere classified: Secondary | ICD-10-CM

## 2023-07-20 DIAGNOSIS — S82842A Displaced bimalleolar fracture of left lower leg, initial encounter for closed fracture: Secondary | ICD-10-CM | POA: Diagnosis not present

## 2023-07-20 DIAGNOSIS — M25572 Pain in left ankle and joints of left foot: Secondary | ICD-10-CM | POA: Diagnosis not present

## 2023-07-20 DIAGNOSIS — R262 Difficulty in walking, not elsewhere classified: Secondary | ICD-10-CM

## 2023-07-20 NOTE — Therapy (Signed)
OUTPATIENT PHYSICAL THERAPY TREATMENT   Patient Name: Shelly Pearson MRN: [id number] DOB:[birth year], 83 y.o., female Today's Date: 07/20/2023  END OF SESSION:  PT End of Session - 07/20/23 1107     Visit Number 6    Date for PT Re-Evaluation 08/19/23    PT Start Time 1102    PT Stop Time 1140    PT Time Calculation (min) 38 min    Activity Tolerance Patient tolerated treatment well    Behavior During Therapy [facility] for tasks assessed/performed            Past Medical History:  Diagnosis Date   B12 deficiency 03/02/2020   GERD (gastroesophageal reflux disease)    Hypertension    Seasonal allergies    Thyroid disease    Urge incontinence of urine 12/10/2020   Urgency incontinence    Past Surgical History:  Procedure Laterality Date   ABDOMINAL HYSTERECTOMY     CESAREAN SECTION     CYSTOCELE REPAIR     ORIF ANKLE FRACTURE Left 06/08/2023   Procedure: LEFT OPEN REDUCTION INTERNAL FIXATION (ORIF) ANKLE FRACTURE;  Surgeon: Huel Cote, MD;  Location: [facility] ORS;  Service: Orthopedics;  Laterality: Left;   ROTATOR CUFF REPAIR Right    2018   Patient Active Problem List   Diagnosis Date Noted   Bimalleolar ankle fracture, left, closed, initial encounter 06/08/2023   Onychomycosis 10/07/2022   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), wet (HCC) 05/24/2022   Decreased grip strength of right hand 09/16/2021   Gait disturbance 07/12/2021   Mild cognitive impairment 07/12/2021   Thyroid goiter 12/10/2020   Mixed hyperlipidemia 12/10/2020   Degenerative cervical spinal stenosis, mild C4-C5 and C5-C6 12/10/2020   Lumbar degenerative disc disease, L4-L5 with left lateral recess stenosis 12/10/2020   Personal history of fall 09/01/2020   Congenital cavus deformity of both feet 03/02/2020   Metatarsalgia of both feet 03/02/2020   Hand arthritis 03/02/2020   Arthritis of carpometacarpal (CMC) joint of both thumbs 09/19/2019   Overactive bladder 09/19/2019   Sciatica 04/26/2019    Paresthesia of both feet 02/04/2019   Epidermal cyst 04/17/2018   Allergic rhinitis 06/23/2015   Eczema 06/23/2015   Gastro-esophageal reflux disease without esophagitis 06/23/2015   Essential hypertension 06/23/2015   Hypothyroidism 06/23/2015     REFERRING PROVIDER: Benancio Deeds, MD   REFERRING DIAG:  [phone] (ICD-10-CM) - Bimalleolar ankle fracture, left, closed, initial encounter     S/p ORIF L ankle bimalleolar fx  Rationale for Evaluation and Treatment: Rehabilitation  THERAPY DIAG:  Pain in left ankle and joints of left foot  Stiffness of left ankle, not elsewhere classified  Localized edema  Difficulty in walking, not elsewhere classified  ONSET DATE: DOS 06/08/23  SUBJECTIVE:  SUBJECTIVE STATEMENT: Pt reports that the Dr D/C'd CAM boot. He wrote to ween out of it. She arrives with no boot, antalgic gait on L. Continue PT x 6 weeks and return.  PERTINENT HISTORY:  History of mild cognitive impairment, paresthesia of both feet and sciatica in history.  Patient reports memory limitations  PAIN:  Are you having pain? No  PRECAUTIONS:  None  RED FLAGS: None   WEIGHT BEARING RESTRICTIONS:  Yes WBAT with boot  FALLS:  Has patient fallen in last 6 months? Yes. Number of falls 1-this was the cause of the injury  LIVING ENVIRONMENT: Lives with: lives with their family and lives with their spouse   OCCUPATION:  Retired  PLOF:  Independent  PATIENT GOALS:  Return to walking, gardening, and other prior level of function  OBJECTIVE:  Note: Objective measures were completed at Evaluation unless otherwise noted.  TREATMENT:                                                                                                                              07/20/23 Bike L3 x  6 minutes R foot and ankle joint mobs, stretch in all planes. Seated ankle ROM/strength on airex pad, DF/PF, Ever/Inv x 15 each. Seated L ankle inversion against yellow Tband Standing heel raises, B, with BUE support x 15, reported not pain, some stretch at post/inf calcaneus. VASO, med pressure, 10 min to L ankle for pain and inflammation.  07/13/23 Seated L ankle AROM on physiodisc, 20 reps DF/PF, and inv/ever, then clockwise and counter clockwise ROM x 10 each Gentle STM and joint mobs in R foot and ankle Gentle PROM for toes in flex and ext, PF and DF of ankle Active ankle ROM against light resistance, yellow Tband for ever, ball squeeze for inversion, PF with foot on floor, DF with 1# resistance, 2 x 10 each Seated HS curl with 20# resistance, BLE, 2 x 10 Attempted knee ext, but too heavy, so moved to mat with 3# weight on L 2 x 10 reps VASO to L ankle, 10 min, med pressure for pain and inflammation.  07/06/23 Seated L ankle AROM on physiodisc, 20 reps DF/PF, and inv/ever Towel scrunches, inversion ever with towel Toe curls up and down Joint mobs in L foot and ankle, passive, gentle stretch into DF Scar mobilization Heel raises, 20 with no weight, then 20 more with 3# weight on knee. Standing weight shifts with BUE support on Rollator, controlled WB through LLE, no more than 25% Seated knee flex against 3# resistance Seated knee flexion against R tband-2 x 10 each, LLE Mini squats with boot on and Rollator for UE support x 10 reps  06/29/23: Sitting  Heel slide 2x10  Towel scrunch 2x10  Toe yoga 2x10  Ankle inv/ev with towel 2x10  Ankle DF stretch with strap x30"  Heel raise 2x10  Ankle DF 2x10  Clamshell red TB 2x10  Knee ext red TB 2x10  Manual therapy  Gentle grade I to II ankle distraction and mobilization for DF  Ankle and toe PROM as tolerated   Treatment                            12/18:  Edema mob DF PROM Passive HS stretch Active HSS with ankle pumps Supine  SLR without touching table Sidelying hip abd + flexion/ext Prone hip extension Gait in boot  PATIENT EDUCATION:  Education details:  Modified HEP Person educated: Patient and Spouse Education method: Explanation, Facilities manager, Verbal cues Education comprehension:  verbalized understanding, returned demonstration, verbal cues required, and needs further education  HOME EXERCISE PROGRAM: [id number]  ASSESSMENT:  CLINICAL IMPRESSION: Patient reports no new issues. The Dr cleared her for getting pout of the CAM boot. She arrives wearing tennis shoes, antalgic gait on L. She reports that she feels some tightness in her L arch. She has arch supports at home, will place them in her shoe and see if that helps. Progressed her ROM and her strengthening for her L foot and ankle. Finished with VASO for anti inflammation and pain relief. Will assess her tolerance to more advanced strength and ROM.  GOALS: Goals reviewed with patient? Yes  SHORT TERM GOALS: Target date: July 01, 2023  Ankle active dorsiflexion to 5 degrees Baseline: Shy of 0 at evaluation Goal status: 07/06/23-10 dgrees met  2.  Able to stand with full weightbearing through foot pain less than or equal to 3 out of 10 Baseline: Unable to evaluation Goal status: 07/13/23, pain up to 3-4 with boot on.    LONG TERM GOALS: Target date: Plan of care date  Meet Foto goal Baseline: See objective Goal status: INITIAL  2.  Able to demonstrate proper ambulation pattern without pain greater than 3 out of 10 for household distances Baseline:  Goal status: 07/20/23-Ambulates with pain up to about 3/10, but then reports that it hurst enough that she has to stop, also observed to limp, so goal is still ongoing.  3.  Ankle dorsiflexion to 10 degrees Baseline:  Goal status: INITIAL  4.  Able to navigate stairs ascending and descending without compensation Baseline:  Goal status: INITIAL  PLAN:  PT FREQUENCY: 1-2x/week  PT  DURATION: Plan of care date  PLANNED INTERVENTIONS: 97164- PT Re-evaluation, 97110-Therapeutic exercises, 97530- Therapeutic activity, 97112- Neuromuscular re-education, 97535- Self Care, 01027- Manual therapy, [phone]- Gait training, [phone]- Aquatic Therapy, 97014- Electrical stimulation (unattended), 97016- Vasopneumatic device, Patient/Family education, Balance training, Stair training, Taping, Dry Needling, Joint mobilization, Spinal mobilization, Scar mobilization, Cryotherapy, and Moist heat.  PLAN FOR NEXT SESSION:  Cont per ankle ORIF protocol and MD orders.  Will see Dr. Steward Drone 07/18/22.   Iona Beard, PT, DPT 07/20/23 11:41 AM

## 2023-07-24 ENCOUNTER — Encounter: Payer: Self-pay | Admitting: Physical Therapy

## 2023-07-24 ENCOUNTER — Ambulatory Visit: Payer: Medicare Other | Admitting: Physical Therapy

## 2023-07-24 DIAGNOSIS — R600 Localized edema: Secondary | ICD-10-CM

## 2023-07-24 DIAGNOSIS — M25672 Stiffness of left ankle, not elsewhere classified: Secondary | ICD-10-CM | POA: Diagnosis not present

## 2023-07-24 DIAGNOSIS — M25572 Pain in left ankle and joints of left foot: Secondary | ICD-10-CM | POA: Diagnosis not present

## 2023-07-24 DIAGNOSIS — R262 Difficulty in walking, not elsewhere classified: Secondary | ICD-10-CM | POA: Diagnosis not present

## 2023-07-24 DIAGNOSIS — S82842A Displaced bimalleolar fracture of left lower leg, initial encounter for closed fracture: Secondary | ICD-10-CM | POA: Diagnosis not present

## 2023-07-24 NOTE — Therapy (Signed)
OUTPATIENT PHYSICAL THERAPY TREATMENT   Patient Name: Shelly Pearson MRN: [id number] DOB:1941-06-03, 83 y.o., female Today's Date: 07/24/2023  END OF SESSION:  PT End of Session - 07/24/23 1514     Visit Number 7    Date for PT Re-Evaluation 08/19/23    PT Start Time 1515    PT Stop Time 1600    PT Time Calculation (min) 45 min    Activity Tolerance Patient tolerated treatment well    Behavior During Therapy [facility] for tasks assessed/performed            Past Medical History:  Diagnosis Date   B12 deficiency 03/02/2020   GERD (gastroesophageal reflux disease)    Hypertension    Seasonal allergies    Thyroid disease    Urge incontinence of urine 12/10/2020   Urgency incontinence    Past Surgical History:  Procedure Laterality Date   ABDOMINAL HYSTERECTOMY     CESAREAN SECTION     CYSTOCELE REPAIR     ORIF ANKLE FRACTURE Left 06/08/2023   Procedure: LEFT OPEN REDUCTION INTERNAL FIXATION (ORIF) ANKLE FRACTURE;  Surgeon: Huel Cote, MD;  Location: [facility] ORS;  Service: Orthopedics;  Laterality: Left;   ROTATOR CUFF REPAIR Right    2018   Patient Active Problem List   Diagnosis Date Noted   Bimalleolar ankle fracture, left, closed, initial encounter 06/08/2023   Onychomycosis 10/07/2022   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), wet (HCC) 05/24/2022   Decreased grip strength of right hand 09/16/2021   Gait disturbance 07/12/2021   Mild cognitive impairment 07/12/2021   Thyroid goiter 12/10/2020   Mixed hyperlipidemia 12/10/2020   Degenerative cervical spinal stenosis, mild C4-C5 and C5-C6 12/10/2020   Lumbar degenerative disc disease, L4-L5 with left lateral recess stenosis 12/10/2020   Personal history of fall 09/01/2020   Congenital cavus deformity of both feet 03/02/2020   Metatarsalgia of both feet 03/02/2020   Hand arthritis 03/02/2020   Arthritis of carpometacarpal (CMC) joint of both thumbs 09/19/2019   Overactive bladder 09/19/2019   Sciatica 04/26/2019    Paresthesia of both feet 02/04/2019   Epidermal cyst 04/17/2018   Allergic rhinitis 06/23/2015   Eczema 06/23/2015   Gastro-esophageal reflux disease without esophagitis 06/23/2015   Essential hypertension 06/23/2015   Hypothyroidism 06/23/2015     REFERRING PROVIDER: Benancio Deeds, MD   REFERRING DIAG:  [id number] (ICD-10-CM) - Bimalleolar ankle fracture, left, closed, initial encounter     S/p ORIF L ankle bimalleolar fx  Rationale for Evaluation and Treatment: Rehabilitation  THERAPY DIAG:  Pain in left ankle and joints of left foot  Stiffness of left ankle, not elsewhere classified  Localized edema  Difficulty in walking, not elsewhere classified  ONSET DATE: DOS 06/08/23  SUBJECTIVE:  SUBJECTIVE STATEMENT: "I feel ok, I mean I get tired more easily, but I have not been doing things for weeks"  PERTINENT HISTORY:  History of mild cognitive impairment, paresthesia of both feet and sciatica in history.  Patient reports memory limitations  PAIN:  Are you having pain? No  PRECAUTIONS:  None  RED FLAGS: None   WEIGHT BEARING RESTRICTIONS:  Yes WBAT with boot  FALLS:  Has patient fallen in last 6 months? Yes. Number of falls 1-this was the cause of the injury  LIVING ENVIRONMENT: Lives with: lives with their family and lives with their spouse   OCCUPATION:  Retired  PLOF:  Independent  PATIENT GOALS:  Return to walking, gardening, and other prior level of function  OBJECTIVE:  Note: Objective measures were completed at Evaluation unless otherwise noted.  TREATMENT:      07/23/22 NuStep L5 x 6 min R foot and ankle joint mobs, stretch in all planes. L ankle AROM all directions L ankle Tband rex 4 way 2x10 Standing march HHAx2  L ankle AROM on dynadisk Sit to  stands  LLE LAQ 3lb 2x10 LLE Hs curls 2x10 red  07/20/23 Bike L3 x 6 minutes R foot and ankle joint mobs, stretch in all planes. Seated ankle ROM/strength on airex pad, DF/PF, Ever/Inv x 15 each. Seated L ankle inversion against yellow Tband Standing heel raises, B, with BUE support x 15, reported not pain, some stretch at post/inf calcaneus. VASO, med pressure, 10 min to L ankle for pain and inflammation.  07/13/23 Seated L ankle AROM on physiodisc, 20 reps DF/PF, and inv/ever, then clockwise and counter clockwise ROM x 10 each Gentle STM and joint mobs in R foot and ankle Gentle PROM for toes in flex and ext, PF and DF of ankle Active ankle ROM against light resistance, yellow Tband for ever, ball squeeze for inversion, PF with foot on floor, DF with 1# resistance, 2 x 10 each Seated HS curl with 20# resistance, BLE, 2 x 10 Attempted knee ext, but too heavy, so moved to mat with 3# weight on L 2 x 10 reps VASO to L ankle, 10 min, med pressure for pain and inflammation.  07/06/23 Seated L ankle AROM on physiodisc, 20 reps DF/PF, and inv/ever Towel scrunches, inversion ever with towel Toe curls up and down Joint mobs in L foot and ankle, passive, gentle stretch into DF Scar mobilization Heel raises, 20 with no weight, then 20 more with 3# weight on knee. Standing weight shifts with BUE support on Rollator, controlled WB through LLE, no more than 25% Seated knee flex against 3# resistance Seated knee flexion against R tband-2 x 10 each, LLE Mini squats with boot on and Rollator for UE support x 10 reps  06/29/23: Sitting  Heel slide 2x10  Towel scrunch 2x10  Toe yoga 2x10  Ankle inv/ev with towel 2x10  Ankle DF stretch with strap x30"  Heel raise 2x10  Ankle DF 2x10  Clamshell red TB 2x10  Knee ext red TB 2x10 Manual therapy  Gentle grade I to II ankle distraction and mobilization for DF  Ankle and toe PROM as tolerated   Treatment                            12/18:  Edema  mob DF PROM Passive HS stretch Active HSS with ankle pumps Supine SLR without touching table Sidelying hip abd + flexion/ext Prone hip extension Gait in  boot  PATIENT EDUCATION:  Education details:  Modified HEP Person educated: Patient and Spouse Education method: Explanation, Facilities manager, Verbal cues Education comprehension:  verbalized understanding, returned demonstration, verbal cues required, and needs further education  HOME EXERCISE PROGRAM: [id number]  ASSESSMENT:  CLINICAL IMPRESSION: Patient reports no new issues. The Dr cleared her for getting pout of the CAM boot. Again she arrives wearing tennis shoes, antalgic gait on L. She reports some pain when weight bearing. Progressed her ROM and her strengthening for her L foot and ankle. Cued needed for LE placement with sit to stands to avoid compensation. Pt denied modality post session. Will assess her tolerance to more advanced strength and ROM.  GOALS: Goals reviewed with patient? Yes  SHORT TERM GOALS: Target date: July 01, 2023  Ankle active dorsiflexion to 5 degrees Baseline: Shy of 0 at evaluation Goal status: 07/06/23-10 dgrees met  2.  Able to stand with full weightbearing through foot pain less than or equal to 3 out of 10 Baseline: Unable to evaluation Goal status: 07/13/23, pain up to 3-4 with boot on.    LONG TERM GOALS: Target date: Plan of care date  Meet Foto goal Baseline: See objective Goal status: INITIAL  2.  Able to demonstrate proper ambulation pattern without pain greater than 3 out of 10 for household distances Baseline:  Goal status: 07/20/23-Ambulates with pain up to about 3/10, but then reports that it hurst enough that she has to stop, also observed to limp, so goal is still ongoing.  3.  Ankle dorsiflexion to 10 degrees Baseline:  Goal status: INITIAL  4.  Able to navigate stairs ascending and descending without compensation Baseline:  Goal status: INITIAL  PLAN:  PT  FREQUENCY: 1-2x/week  PT DURATION: Plan of care date  PLANNED INTERVENTIONS: 97164- PT Re-evaluation, 97110-Therapeutic exercises, 97530- Therapeutic activity, 97112- Neuromuscular re-education, 97535- Self Care, 16109- Manual therapy, [phone]- Gait training, [id number]- Aquatic Therapy, 97014- Electrical stimulation (unattended), 97016- Vasopneumatic device, Patient/Family education, Balance training, Stair training, Taping, Dry Needling, Joint mobilization, Spinal mobilization, Scar mobilization, Cryotherapy, and Moist heat.  PLAN FOR NEXT SESSION:  Cont per ankle ORIF protocol and MD orders.  Will see Dr. Steward Drone 07/18/22.   Grayce Sessions, PTA,  07/24/23 3:15 PM

## 2023-07-27 ENCOUNTER — Encounter: Payer: Self-pay | Admitting: Physical Therapy

## 2023-07-27 ENCOUNTER — Ambulatory Visit: Payer: Medicare Other | Admitting: Physical Therapy

## 2023-07-27 DIAGNOSIS — R600 Localized edema: Secondary | ICD-10-CM | POA: Diagnosis not present

## 2023-07-27 DIAGNOSIS — R262 Difficulty in walking, not elsewhere classified: Secondary | ICD-10-CM

## 2023-07-27 DIAGNOSIS — M25572 Pain in left ankle and joints of left foot: Secondary | ICD-10-CM | POA: Diagnosis not present

## 2023-07-27 DIAGNOSIS — M25672 Stiffness of left ankle, not elsewhere classified: Secondary | ICD-10-CM

## 2023-07-27 DIAGNOSIS — S82842A Displaced bimalleolar fracture of left lower leg, initial encounter for closed fracture: Secondary | ICD-10-CM | POA: Diagnosis not present

## 2023-07-27 NOTE — Therapy (Signed)
OUTPATIENT PHYSICAL THERAPY TREATMENT   Patient Name: Shelly Pearson MRN: [id number] DOB:[birth year], 83 y.o., female Today's Date: 07/27/2023  END OF SESSION:  PT End of Session - 07/27/23 1145     Visit Number 8    Date for PT Re-Evaluation 08/19/23    PT Start Time 1100    PT Stop Time 1145    PT Time Calculation (min) 45 min    Activity Tolerance Patient tolerated treatment well    Behavior During Therapy [facility] for tasks assessed/performed             Past Medical History:  Diagnosis Date   B12 deficiency 03/02/2020   GERD (gastroesophageal reflux disease)    Hypertension    Seasonal allergies    Thyroid disease    Urge incontinence of urine 12/10/2020   Urgency incontinence    Past Surgical History:  Procedure Laterality Date   ABDOMINAL HYSTERECTOMY     CESAREAN SECTION     CYSTOCELE REPAIR     ORIF ANKLE FRACTURE Left 06/08/2023   Procedure: LEFT OPEN REDUCTION INTERNAL FIXATION (ORIF) ANKLE FRACTURE;  Surgeon: Huel Cote, MD;  Location: [facility] ORS;  Service: Orthopedics;  Laterality: Left;   ROTATOR CUFF REPAIR Right    2018   Patient Active Problem List   Diagnosis Date Noted   Bimalleolar ankle fracture, left, closed, initial encounter 06/08/2023   Onychomycosis 10/07/2022   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), wet (HCC) 05/24/2022   Decreased grip strength of right hand 09/16/2021   Gait disturbance 07/12/2021   Mild cognitive impairment 07/12/2021   Thyroid goiter 12/10/2020   Mixed hyperlipidemia 12/10/2020   Degenerative cervical spinal stenosis, mild C4-C5 and C5-C6 12/10/2020   Lumbar degenerative disc disease, L4-L5 with left lateral recess stenosis 12/10/2020   Personal history of fall 09/01/2020   Congenital cavus deformity of both feet 03/02/2020   Metatarsalgia of both feet 03/02/2020   Hand arthritis 03/02/2020   Arthritis of carpometacarpal (CMC) joint of both thumbs 09/19/2019   Overactive bladder 09/19/2019   Sciatica 04/26/2019    Paresthesia of both feet 02/04/2019   Epidermal cyst 04/17/2018   Allergic rhinitis 06/23/2015   Eczema 06/23/2015   Gastro-esophageal reflux disease without esophagitis 06/23/2015   Essential hypertension 06/23/2015   Hypothyroidism 06/23/2015     REFERRING PROVIDER: Benancio Deeds, MD   REFERRING DIAG:  [phone] (ICD-10-CM) - Bimalleolar ankle fracture, left, closed, initial encounter     S/p ORIF L ankle bimalleolar fx  Rationale for Evaluation and Treatment: Rehabilitation  THERAPY DIAG:  Pain in left ankle and joints of left foot  Stiffness of left ankle, not elsewhere classified  Localized edema  Difficulty in walking, not elsewhere classified  ONSET DATE: DOS 06/08/23  SUBJECTIVE:  SUBJECTIVE STATEMENT: Patient reports no issues after last treatment.  PERTINENT HISTORY:  History of mild cognitive impairment, paresthesia of both feet and sciatica in history.  Patient reports memory limitations  PAIN:  Are you having pain? No  PRECAUTIONS:  None  RED FLAGS: None   WEIGHT BEARING RESTRICTIONS:  Yes WBAT with boot  FALLS:  Has patient fallen in last 6 months? Yes. Number of falls 1-this was the cause of the injury  LIVING ENVIRONMENT: Lives with: lives with their family and lives with their spouse   OCCUPATION:  Retired  PLOF:  Independent  PATIENT GOALS:  Return to walking, gardening, and other prior level of function  OBJECTIVE:  Note: Objective measures were completed at Evaluation unless otherwise noted.  TREATMENT:      07/27/23 R ankle mobilization on physiodisc, in sit, 30 reps DF/PF, then inv/ever, then circles in each direction. Seated Ball squeeze, 3 x 10 Seated ankle eversion against G tband resistance, 3 x 10 reps. Standing on step, stretching  for gastroc and soleus Standing on floor, heel raises, educated to use LLE as much as possible, unable to eccentrically control L ankle. B side stepping on airex plank x 5 each way, no UE support  07/23/22 NuStep L5 x 6 min R foot and ankle joint mobs, stretch in all planes. L ankle AROM all directions L ankle Tband rex 4 way 2x10 Standing march HHAx2  L ankle AROM on dynadisk Sit to stands  LLE LAQ 3lb 2x10 LLE Hs curls 2x10 red  07/20/23 Bike L3 x 6 minutes R foot and ankle joint mobs, stretch in all planes. Seated ankle ROM/strength on airex pad, DF/PF, Ever/Inv x 15 each. Seated L ankle inversion against yellow Tband Standing heel raises, B, with BUE support x 15, reported not pain, some stretch at post/inf calcaneus. VASO, med pressure, 10 min to L ankle for pain and inflammation.  07/13/23 Seated L ankle AROM on physiodisc, 20 reps DF/PF, and inv/ever, then clockwise and counter clockwise ROM x 10 each Gentle STM and joint mobs in R foot and ankle Gentle PROM for toes in flex and ext, PF and DF of ankle Active ankle ROM against light resistance, yellow Tband for ever, ball squeeze for inversion, PF with foot on floor, DF with 1# resistance, 2 x 10 each Seated HS curl with 20# resistance, BLE, 2 x 10 Attempted knee ext, but too heavy, so moved to mat with 3# weight on L 2 x 10 reps VASO to L ankle, 10 min, med pressure for pain and inflammation.  07/06/23 Seated L ankle AROM on physiodisc, 20 reps DF/PF, and inv/ever Towel scrunches, inversion ever with towel Toe curls up and down Joint mobs in L foot and ankle, passive, gentle stretch into DF Scar mobilization Heel raises, 20 with no weight, then 20 more with 3# weight on knee. Standing weight shifts with BUE support on Rollator, controlled WB through LLE, no more than 25% Seated knee flex against 3# resistance Seated knee flexion against R tband-2 x 10 each, LLE Mini squats with boot on and Rollator for UE support x 10  reps  06/29/23: Sitting  Heel slide 2x10  Towel scrunch 2x10  Toe yoga 2x10  Ankle inv/ev with towel 2x10  Ankle DF stretch with strap x30"  Heel raise 2x10  Ankle DF 2x10  Clamshell red TB 2x10  Knee ext red TB 2x10 Manual therapy  Gentle grade I to II ankle distraction and mobilization for DF  Ankle  and toe PROM as tolerated   Treatment                            12/18:  Edema mob DF PROM Passive HS stretch Active HSS with ankle pumps Supine SLR without touching table Sidelying hip abd + flexion/ext Prone hip extension Gait in boot  PATIENT EDUCATION:  Education details:  Modified HEP Person educated: Patient and Spouse Education method: Explanation, Facilities manager, Verbal cues Education comprehension:  verbalized understanding, returned demonstration, verbal cues required, and needs further education  HOME EXERCISE PROGRAM: [id number]  ASSESSMENT:  CLINICAL IMPRESSION: Patient reports no new issues. Treatment progressed her ROM and strengthening with dynamic balance training activities. Improving strength and ROM noted with activities.  GOALS: Goals reviewed with patient? Yes  SHORT TERM GOALS: Target date: July 01, 2023  Ankle active dorsiflexion to 5 degrees Baseline: Shy of 0 at evaluation Goal status: 07/06/23-10 degrees met  2.  Able to stand with full weightbearing through foot pain less than or equal to 3 out of 10 Baseline: Unable to evaluation Goal status: 07/27/23-Patient avoids full Wbing due to pain and fear of it giving way.    LONG TERM GOALS: Target date: Plan of care date  Meet Foto goal Baseline: See objective Goal status: INITIAL  2.  Able to demonstrate proper ambulation pattern without pain greater than 3 out of 10 for household distances Baseline:  Goal status: 07/20/23-Ambulates with pain up to about 3/10, but then reports that it hurst enough that she has to stop, also observed to limp, so goal is still ongoing.  3.  Ankle  dorsiflexion to 10 degrees Baseline:  Goal status: INITIAL  4.  Able to navigate stairs ascending and descending without compensation Baseline:  Goal status: INITIAL  PLAN:  PT FREQUENCY: 1-2x/week  PT DURATION: Plan of care date  PLANNED INTERVENTIONS: 97164- PT Re-evaluation, 97110-Therapeutic exercises, 97530- Therapeutic activity, 97112- Neuromuscular re-education, 97535- Self Care, 30160- Manual therapy, [phone]- Gait training, [phone]- Aquatic Therapy, 97014- Electrical stimulation (unattended), 97016- Vasopneumatic device, Patient/Family education, Balance training, Stair training, Taping, Dry Needling, Joint mobilization, Spinal mobilization, Scar mobilization, Cryotherapy, and Moist heat.  PLAN FOR NEXT SESSION:  Cont per ankle ORIF protocol and MD orders.  Will see Dr. Steward Drone 07/18/22.   Iona Beard, PT,  07/27/23 11:46 AM

## 2023-07-28 DIAGNOSIS — Z1231 Encounter for screening mammogram for malignant neoplasm of breast: Secondary | ICD-10-CM | POA: Diagnosis not present

## 2023-07-28 LAB — HM MAMMOGRAPHY

## 2023-07-31 ENCOUNTER — Ambulatory Visit: Payer: Medicare Other | Admitting: Physical Therapy

## 2023-07-31 ENCOUNTER — Encounter: Payer: Self-pay | Admitting: Physical Therapy

## 2023-07-31 ENCOUNTER — Encounter: Payer: Self-pay | Admitting: Family Medicine

## 2023-07-31 DIAGNOSIS — M25572 Pain in left ankle and joints of left foot: Secondary | ICD-10-CM | POA: Diagnosis not present

## 2023-07-31 DIAGNOSIS — R262 Difficulty in walking, not elsewhere classified: Secondary | ICD-10-CM | POA: Diagnosis not present

## 2023-07-31 DIAGNOSIS — R600 Localized edema: Secondary | ICD-10-CM

## 2023-07-31 DIAGNOSIS — M25672 Stiffness of left ankle, not elsewhere classified: Secondary | ICD-10-CM | POA: Diagnosis not present

## 2023-07-31 DIAGNOSIS — S82842A Displaced bimalleolar fracture of left lower leg, initial encounter for closed fracture: Secondary | ICD-10-CM | POA: Diagnosis not present

## 2023-07-31 NOTE — Therapy (Signed)
OUTPATIENT PHYSICAL THERAPY TREATMENT   Patient Name: Shelly Pearson MRN: [id number] DOB:18-Apr-1941, 83 y.o., female Today's Date: 07/31/2023  END OF SESSION:  PT End of Session - 07/31/23 1516     Visit Number 9    Date for PT Re-Evaluation 08/19/23    PT Start Time 1515    PT Stop Time 1600    PT Time Calculation (min) 45 min    Activity Tolerance Patient tolerated treatment well    Behavior During Therapy [facility] for tasks assessed/performed             Past Medical History:  Diagnosis Date   B12 deficiency 03/02/2020   GERD (gastroesophageal reflux disease)    Hypertension    Seasonal allergies    Thyroid disease    Urge incontinence of urine 12/10/2020   Urgency incontinence    Past Surgical History:  Procedure Laterality Date   ABDOMINAL HYSTERECTOMY     CESAREAN SECTION     CYSTOCELE REPAIR     ORIF ANKLE FRACTURE Left 06/08/2023   Procedure: LEFT OPEN REDUCTION INTERNAL FIXATION (ORIF) ANKLE FRACTURE;  Surgeon: Huel Cote, MD;  Location: [facility] ORS;  Service: Orthopedics;  Laterality: Left;   ROTATOR CUFF REPAIR Right    2018   Patient Active Problem List   Diagnosis Date Noted   Bimalleolar ankle fracture, left, closed, initial encounter 06/08/2023   Onychomycosis 10/07/2022   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), wet (HCC) 05/24/2022   Decreased grip strength of right hand 09/16/2021   Gait disturbance 07/12/2021   Mild cognitive impairment 07/12/2021   Thyroid goiter 12/10/2020   Mixed hyperlipidemia 12/10/2020   Degenerative cervical spinal stenosis, mild C4-C5 and C5-C6 12/10/2020   Lumbar degenerative disc disease, L4-L5 with left lateral recess stenosis 12/10/2020   Personal history of fall 09/01/2020   Congenital cavus deformity of both feet 03/02/2020   Metatarsalgia of both feet 03/02/2020   Hand arthritis 03/02/2020   Arthritis of carpometacarpal (CMC) joint of both thumbs 09/19/2019   Overactive bladder 09/19/2019   Sciatica 04/26/2019    Paresthesia of both feet 02/04/2019   Epidermal cyst 04/17/2018   Allergic rhinitis 06/23/2015   Eczema 06/23/2015   Gastro-esophageal reflux disease without esophagitis 06/23/2015   Essential hypertension 06/23/2015   Hypothyroidism 06/23/2015     REFERRING PROVIDER: Benancio Deeds, MD   REFERRING DIAG:  [id number] (ICD-10-CM) - Bimalleolar ankle fracture, left, closed, initial encounter     S/p ORIF L ankle bimalleolar fx  Rationale for Evaluation and Treatment: Rehabilitation  THERAPY DIAG:  Pain in left ankle and joints of left foot  Stiffness of left ankle, not elsewhere classified  Localized edema  Difficulty in walking, not elsewhere classified  ONSET DATE: DOS 06/08/23  SUBJECTIVE:  SUBJECTIVE STATEMENT: "I am not bad"  L ankle is still sore  PERTINENT HISTORY:  History of mild cognitive impairment, paresthesia of both feet and sciatica in history.  Patient reports memory limitations  PAIN:  Are you having pain? No  PRECAUTIONS:  None  RED FLAGS: None   WEIGHT BEARING RESTRICTIONS:  Yes WBAT with boot  FALLS:  Has patient fallen in last 6 months? Yes. Number of falls 1-this was the cause of the injury  LIVING ENVIRONMENT: Lives with: lives with their family and lives with their spouse   OCCUPATION:  Retired  PLOF:  Independent  PATIENT GOALS:  Return to walking, gardening, and other prior level of function  OBJECTIVE:  Note: Objective measures were completed at Evaluation unless otherwise noted.  TREATMENT:    07/31/23 Bike L3 x 7 minutes 4 in step ups 2x5 each Slant board Calf stretch 5x10'' HS curls 25lb 2x10 Leg Ext 5lb 2x10 L ankle Tband red 4 way 2x10 L ankle AROM all directions Sides step on balance beam in // bars  07/27/23 R ankle  mobilization on physiodisc, in sit, 30 reps DF/PF, then inv/ever, then circles in each direction. Seated Ball squeeze, 3 x 10 Seated ankle eversion against G tband resistance, 3 x 10 reps. Standing on step, stretching for gastroc and soleus Standing on floor, heel raises, educated to use LLE as much as possible, unable to eccentrically control L ankle. B side stepping on airex plank x 5 each way, no UE support  07/23/22 NuStep L5 x 6 min R foot and ankle joint mobs, stretch in all planes. L ankle AROM all directions L ankle Tband rex 4 way 2x10 Standing march HHAx2  L ankle AROM on dynadisk Sit to stands  LLE LAQ 3lb 2x10 LLE Hs curls 2x10 red  07/20/23 Bike L3 x 6 minutes R foot and ankle joint mobs, stretch in all planes. Seated ankle ROM/strength on airex pad, DF/PF, Ever/Inv x 15 each. Seated L ankle inversion against yellow Tband Standing heel raises, B, with BUE support x 15, reported not pain, some stretch at post/inf calcaneus. VASO, med pressure, 10 min to L ankle for pain and inflammation.  07/13/23 Seated L ankle AROM on physiodisc, 20 reps DF/PF, and inv/ever, then clockwise and counter clockwise ROM x 10 each Gentle STM and joint mobs in R foot and ankle Gentle PROM for toes in flex and ext, PF and DF of ankle Active ankle ROM against light resistance, yellow Tband for ever, ball squeeze for inversion, PF with foot on floor, DF with 1# resistance, 2 x 10 each Seated HS curl with 20# resistance, BLE, 2 x 10 Attempted knee ext, but too heavy, so moved to mat with 3# weight on L 2 x 10 reps VASO to L ankle, 10 min, med pressure for pain and inflammation.  07/06/23 Seated L ankle AROM on physiodisc, 20 reps DF/PF, and inv/ever Towel scrunches, inversion ever with towel Toe curls up and down Joint mobs in L foot and ankle, passive, gentle stretch into DF Scar mobilization Heel raises, 20 with no weight, then 20 more with 3# weight on knee. Standing weight shifts with BUE  support on Rollator, controlled WB through LLE, no more than 25% Seated knee flex against 3# resistance Seated knee flexion against R tband-2 x 10 each, LLE Mini squats with boot on and Rollator for UE support x 10 reps  06/29/23:   PATIENT EDUCATION:  Education details:  Modified HEP Person educated: Patient and  Spouse Education method: Explanation, Demonstration, Verbal cues Education comprehension:  verbalized understanding, returned demonstration, verbal cues required, and needs further education  HOME EXERCISE PROGRAM: [id number]  ASSESSMENT:  CLINICAL IMPRESSION: Patient reports no new issues. Treatment progressed her ROM and strengthening with dynamic balance training activities. Some uncertainty with step ups but able to complete. Some UE use needed with side step on balance beam.  GOALS: Goals reviewed with patient? Yes  SHORT TERM GOALS: Target date: July 01, 2023  Ankle active dorsiflexion to 5 degrees Baseline: Shy of 0 at evaluation Goal status: 07/06/23-10 degrees met  2.  Able to stand with full weightbearing through foot pain less than or equal to 3 out of 10 Baseline: Unable to evaluation Goal status: 07/27/23-Patient avoids full Wbing due to pain and fear of it giving way.    LONG TERM GOALS: Target date: Plan of care date  Meet Foto goal Baseline: See objective Goal status: INITIAL  2.  Able to demonstrate proper ambulation pattern without pain greater than 3 out of 10 for household distances Baseline:  Goal status: 07/20/23-Ambulates with pain up to about 3/10, but then reports that it hurst enough that she has to stop, also observed to limp, so goal is still ongoing.  3.  Ankle dorsiflexion to 10 degrees Baseline:  Goal status: INITIAL  4.  Able to navigate stairs ascending and descending without compensation Baseline:  Goal status: INITIAL  PLAN:  PT FREQUENCY: 1-2x/week  PT DURATION: Plan of care date  PLANNED INTERVENTIONS: 97164- PT  Re-evaluation, 97110-Therapeutic exercises, 97530- Therapeutic activity, 97112- Neuromuscular re-education, 97535- Self Care, 09604- Manual therapy, [phone]- Gait training, [phone]- Aquatic Therapy, 97014- Electrical stimulation (unattended), 97016- Vasopneumatic device, Patient/Family education, Balance training, Stair training, Taping, Dry Needling, Joint mobilization, Spinal mobilization, Scar mobilization, Cryotherapy, and Moist heat.  PLAN FOR NEXT SESSION:  Cont per ankle ORIF protocol and MD orders.  Will see Dr. Steward Drone 07/18/22.   Grayce Sessions, PTA,  07/31/23 3:17 PM

## 2023-08-03 ENCOUNTER — Ambulatory Visit: Payer: Medicare Other | Admitting: Physical Therapy

## 2023-08-03 ENCOUNTER — Encounter: Payer: Self-pay | Admitting: Physical Therapy

## 2023-08-03 DIAGNOSIS — R600 Localized edema: Secondary | ICD-10-CM | POA: Diagnosis not present

## 2023-08-03 DIAGNOSIS — M25572 Pain in left ankle and joints of left foot: Secondary | ICD-10-CM | POA: Diagnosis not present

## 2023-08-03 DIAGNOSIS — M25672 Stiffness of left ankle, not elsewhere classified: Secondary | ICD-10-CM

## 2023-08-03 DIAGNOSIS — R262 Difficulty in walking, not elsewhere classified: Secondary | ICD-10-CM | POA: Diagnosis not present

## 2023-08-03 DIAGNOSIS — S82842A Displaced bimalleolar fracture of left lower leg, initial encounter for closed fracture: Secondary | ICD-10-CM | POA: Diagnosis not present

## 2023-08-03 NOTE — Therapy (Signed)
OUTPATIENT PHYSICAL THERAPY TREATMENT  Progress Note Reporting Period 06/10/23 to 08/03/23  See note below for Objective Data and Assessment of Progress/Goals.     Patient Name: Shelly Pearson MRN: [id number] DOB:[birth year], 83 y.o., female Today's Date: 08/03/2023  END OF SESSION:  PT End of Session - 08/03/23 1056     Visit Number 10    Date for PT Re-Evaluation 08/19/23    PT Start Time 1050    PT Stop Time 1130    PT Time Calculation (min) 40 min    Activity Tolerance Patient tolerated treatment well    Behavior During Therapy [facility] for tasks assessed/performed            Past Medical History:  Diagnosis Date   B12 deficiency 03/02/2020   GERD (gastroesophageal reflux disease)    Hypertension    Seasonal allergies    Thyroid disease    Urge incontinence of urine 12/10/2020   Urgency incontinence    Past Surgical History:  Procedure Laterality Date   ABDOMINAL HYSTERECTOMY     CESAREAN SECTION     CYSTOCELE REPAIR     ORIF ANKLE FRACTURE Left 06/08/2023   Procedure: LEFT OPEN REDUCTION INTERNAL FIXATION (ORIF) ANKLE FRACTURE;  Surgeon: Huel Cote, MD;  Location: [facility] ORS;  Service: Orthopedics;  Laterality: Left;   ROTATOR CUFF REPAIR Right    2018   Patient Active Problem List   Diagnosis Date Noted   Bimalleolar ankle fracture, left, closed, initial encounter 06/08/2023   Onychomycosis 10/07/2022   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), wet (HCC) 05/24/2022   Decreased grip strength of right hand 09/16/2021   Gait disturbance 07/12/2021   Mild cognitive impairment 07/12/2021   Thyroid goiter 12/10/2020   Mixed hyperlipidemia 12/10/2020   Degenerative cervical spinal stenosis, mild C4-C5 and C5-C6 12/10/2020   Lumbar degenerative disc disease, L4-L5 with left lateral recess stenosis 12/10/2020   Personal history of fall 09/01/2020   Congenital cavus deformity of both feet 03/02/2020   Metatarsalgia of both feet 03/02/2020   Hand arthritis 03/02/2020    Arthritis of carpometacarpal (CMC) joint of both thumbs 09/19/2019   Overactive bladder 09/19/2019   Sciatica 04/26/2019   Paresthesia of both feet 02/04/2019   Epidermal cyst 04/17/2018   Allergic rhinitis 06/23/2015   Eczema 06/23/2015   Gastro-esophageal reflux disease without esophagitis 06/23/2015   Essential hypertension 06/23/2015   Hypothyroidism 06/23/2015     REFERRING PROVIDER: Benancio Deeds, MD   REFERRING DIAG:  [phone] (ICD-10-CM) - Bimalleolar ankle fracture, left, closed, initial encounter     S/p ORIF L ankle bimalleolar fx  Rationale for Evaluation and Treatment: Rehabilitation  THERAPY DIAG:  Pain in left ankle and joints of left foot  Stiffness of left ankle, not elsewhere classified  Localized edema  Difficulty in walking, not elsewhere classified  ONSET DATE: DOS 06/08/23  SUBJECTIVE:  SUBJECTIVE STATEMENT: L ankle remains a little sore. 2-3/10, slightly increased overall.  PERTINENT HISTORY:  History of mild cognitive impairment, paresthesia of both feet and sciatica in history.  Patient reports memory limitations  PAIN:  Are you having pain? No  PRECAUTIONS:  None  RED FLAGS: None   WEIGHT BEARING RESTRICTIONS:  Yes WBAT with boot  FALLS:  Has patient fallen in last 6 months? Yes. Number of falls 1-this was the cause of the injury  LIVING ENVIRONMENT: Lives with: lives with their family and lives with their spouse   OCCUPATION:  Retired  PLOF:  Independent  PATIENT GOALS:  Return to walking, gardening, and other prior level of function  OBJECTIVE:  Note: Objective measures were completed at Evaluation unless otherwise noted.  TREATMENT:    08/03/23 Bike L4 x 6 min AROM on physiodisc, 20 reps in each direction. Ankle strengthening,  ball squeezes, ever against ball, PF against ball x 10 each with 5 sec hold. Seated ankle PF against Blue Tband slow, full ROM, concentric and eccentric x 20 reps Seated DF against G tband resistance x 10 reps Parallel bars rocker board ant/post and then lateral x 20 reps each Goals re-assessed. VASO to L ankle for inflammation and pain relief.  07/31/23 Bike L3 x 7 minutes 4 in step ups 2x5 each Slant board Calf stretch 5x10'' HS curls 25lb 2x10 Leg Ext 5lb 2x10 L ankle Tband red 4 way 2x10 L ankle AROM all directions Sides step on balance beam in // bars  07/27/23 R ankle mobilization on physiodisc, in sit, 30 reps DF/PF, then inv/ever, then circles in each direction. Seated Ball squeeze, 3 x 10 Seated ankle eversion against G tband resistance, 3 x 10 reps. Standing on step, stretching for gastroc and soleus Standing on floor, heel raises, educated to use LLE as much as possible, unable to eccentrically control L ankle. B side stepping on airex plank x 5 each way, no UE support  07/23/22 NuStep L5 x 6 min R foot and ankle joint mobs, stretch in all planes. L ankle AROM all directions L ankle Tband rex 4 way 2x10 Standing march HHAx2  L ankle AROM on dynadisk Sit to stands  LLE LAQ 3lb 2x10 LLE Hs curls 2x10 red  07/20/23 Bike L3 x 6 minutes R foot and ankle joint mobs, stretch in all planes. Seated ankle ROM/strength on airex pad, DF/PF, Ever/Inv x 15 each. Seated L ankle inversion against yellow Tband Standing heel raises, B, with BUE support x 15, reported not pain, some stretch at post/inf calcaneus. VASO, med pressure, 10 min to L ankle for pain and inflammation.  07/13/23 Seated L ankle AROM on physiodisc, 20 reps DF/PF, and inv/ever, then clockwise and counter clockwise ROM x 10 each Gentle STM and joint mobs in R foot and ankle Gentle PROM for toes in flex and ext, PF and DF of ankle Active ankle ROM against light resistance, yellow Tband for ever, ball squeeze for  inversion, PF with foot on floor, DF with 1# resistance, 2 x 10 each Seated HS curl with 20# resistance, BLE, 2 x 10 Attempted knee ext, but too heavy, so moved to mat with 3# weight on L 2 x 10 reps VASO to L ankle, 10 min, med pressure for pain and inflammation.  07/06/23 Seated L ankle AROM on physiodisc, 20 reps DF/PF, and inv/ever Towel scrunches, inversion ever with towel Toe curls up and down Joint mobs in L foot and ankle, passive, gentle stretch  into DF Scar mobilization Heel raises, 20 with no weight, then 20 more with 3# weight on knee. Standing weight shifts with BUE support on Rollator, controlled WB through LLE, no more than 25% Seated knee flex against 3# resistance Seated knee flexion against R tband-2 x 10 each, LLE Mini squats with boot on and Rollator for UE support x 10 reps  06/29/23:   PATIENT EDUCATION:  Education details:  Modified HEP Person educated: Patient and Spouse Education method: Programmer, multimedia, Facilities manager, Verbal cues Education comprehension:  verbalized understanding, returned demonstration, verbal cues required, and needs further education  HOME EXERCISE PROGRAM: [id number]  ASSESSMENT:  CLINICAL IMPRESSION: Patient reports some soreness in the ankle, up to 3/10 on med and lateral ankle below malleoli, but not really limiting her, she takes a bit of rest when it hurts. Functional and goal status re-assessed for PN. She has made great progress. Her ankle remains weak, so she will benefit from continued PT to progress her through the strengthening phase and to facilitate normal gait and stair climbing.  GOALS: Goals reviewed with patient? Yes  SHORT TERM GOALS: Target date: July 01, 2023  Ankle active dorsiflexion to 5 degrees Baseline: Shy of 0 at evaluation Goal status: 07/06/23-10 degrees met  2.  Able to stand with full weightbearing through foot pain less than or equal to 3 out of 10 Baseline: Unable to evaluation Goal status:  08/03/23-Met    LONG TERM GOALS: Target date: Plan of care date  Meet Foto goal Baseline: See objective Goal status: INITIAL  2.  Able to demonstrate proper ambulation pattern without pain greater than 3 out of 10 for household distances Baseline:  Goal status: 08/03/23-Ambulates with pain up to about 3/10, but limited distance, progressing  3.  Ankle dorsiflexion to 10 degrees Baseline:  Goal status: 08/03/23-17 degrees, met  4.  Able to navigate stairs ascending and descending without compensation Baseline:  Goal status: 08/03/23- Step to pattern using 2 rails. Ongoing  PLAN:  PT FREQUENCY: 1-2x/week  PT DURATION: Plan of care date  PLANNED INTERVENTIONS: 97164- PT Re-evaluation, 97110-Therapeutic exercises, 97530- Therapeutic activity, 97112- Neuromuscular re-education, 97535- Self Care, 62130- Manual therapy, [phone]- Gait training, [phone]- Aquatic Therapy, 97014- Electrical stimulation (unattended), 97016- Vasopneumatic device, Patient/Family education, Balance training, Stair training, Taping, Dry Needling, Joint mobilization, Spinal mobilization, Scar mobilization, Cryotherapy, and Moist heat.  PLAN FOR NEXT SESSION:  Cont per ankle ORIF protocol and MD orders.  Will see Dr. Steward Drone 07/18/22.   Iona Beard, DPT,  08/03/23 11:42 AM

## 2023-08-07 ENCOUNTER — Ambulatory Visit: Payer: Medicare Other | Attending: Orthopaedic Surgery | Admitting: Physical Therapy

## 2023-08-07 ENCOUNTER — Encounter: Payer: Self-pay | Admitting: Physical Therapy

## 2023-08-07 DIAGNOSIS — M25672 Stiffness of left ankle, not elsewhere classified: Secondary | ICD-10-CM | POA: Insufficient documentation

## 2023-08-07 DIAGNOSIS — R600 Localized edema: Secondary | ICD-10-CM | POA: Insufficient documentation

## 2023-08-07 DIAGNOSIS — R262 Difficulty in walking, not elsewhere classified: Secondary | ICD-10-CM | POA: Diagnosis not present

## 2023-08-07 DIAGNOSIS — M25572 Pain in left ankle and joints of left foot: Secondary | ICD-10-CM | POA: Insufficient documentation

## 2023-08-07 NOTE — Therapy (Signed)
OUTPATIENT PHYSICAL THERAPY TREATMENT      Patient Name: Shelly Pearson MRN: [id number] DOB:Sep 19, 1940, 83 y.o., female Today's Date: 08/07/2023  END OF SESSION:  PT End of Session - 08/07/23 1503     Visit Number 11    Number of Visits 21    Date for PT Re-Evaluation 08/19/23    Authorization Type MCR    Progress Note Due on Visit 20    PT Start Time 1505    PT Stop Time 1551    PT Time Calculation (min) 46 min    Activity Tolerance Patient tolerated treatment well    Behavior During Therapy [facility] for tasks assessed/performed             Past Medical History:  Diagnosis Date   B12 deficiency 03/02/2020   GERD (gastroesophageal reflux disease)    Hypertension    Seasonal allergies    Thyroid disease    Urge incontinence of urine 12/10/2020   Urgency incontinence    Past Surgical History:  Procedure Laterality Date   ABDOMINAL HYSTERECTOMY     CESAREAN SECTION     CYSTOCELE REPAIR     ORIF ANKLE FRACTURE Left 06/08/2023   Procedure: LEFT OPEN REDUCTION INTERNAL FIXATION (ORIF) ANKLE FRACTURE;  Surgeon: Huel Cote, MD;  Location: [facility] ORS;  Service: Orthopedics;  Laterality: Left;   ROTATOR CUFF REPAIR Right    2018   Patient Active Problem List   Diagnosis Date Noted   Bimalleolar ankle fracture, left, closed, initial encounter 06/08/2023   Onychomycosis 10/07/2022   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), wet (HCC) 05/24/2022   Decreased grip strength of right hand 09/16/2021   Gait disturbance 07/12/2021   Mild cognitive impairment 07/12/2021   Thyroid goiter 12/10/2020   Mixed hyperlipidemia 12/10/2020   Degenerative cervical spinal stenosis, mild C4-C5 and C5-C6 12/10/2020   Lumbar degenerative disc disease, L4-L5 with left lateral recess stenosis 12/10/2020   Personal history of fall 09/01/2020   Congenital cavus deformity of both feet 03/02/2020   Metatarsalgia of both feet 03/02/2020   Hand arthritis 03/02/2020   Arthritis of carpometacarpal  (CMC) joint of both thumbs 09/19/2019   Overactive bladder 09/19/2019   Sciatica 04/26/2019   Paresthesia of both feet 02/04/2019   Epidermal cyst 04/17/2018   Allergic rhinitis 06/23/2015   Eczema 06/23/2015   Gastro-esophageal reflux disease without esophagitis 06/23/2015   Essential hypertension 06/23/2015   Hypothyroidism 06/23/2015     REFERRING PROVIDER: Benancio Deeds, MD   REFERRING DIAG:  [phone] (ICD-10-CM) - Bimalleolar ankle fracture, left, closed, initial encounter     S/p ORIF L ankle bimalleolar fx  Rationale for Evaluation and Treatment: Rehabilitation  THERAPY DIAG:  Pain in left ankle and joints of left foot  Stiffness of left ankle, not elsewhere classified  Localized edema  Difficulty in walking, not elsewhere classified  ONSET DATE: DOS 06/08/23  SUBJECTIVE:  SUBJECTIVE STATEMENT:  My ankle is still not normal, having to wear my husband's shoes due to the size of that surgical ankle and swelling too. Not a lot is new since last time, not doing a lot because its hard to get about much with the ankle. Trying to avoid the flu too. Haven't been walking around much today due to ankle feeling sore.    PERTINENT HISTORY:  History of mild cognitive impairment, paresthesia of both feet and sciatica in history.  Patient reports memory limitations  PAIN:  Are you having pain? Yes: NPRS scale: 1-2/10 Pain location: L ankle  Pain description: "just sore"  Aggravating factors: pressure against the right side of this ankle, being on feet too much Relieving factors: taking a break from doing too much     PRECAUTIONS:  None-ok to wean out of cam per MD note 07/19/23  RED FLAGS: None   WEIGHT BEARING RESTRICTIONS:  Yes WBAT with boot  FALLS:  Has patient fallen in last  6 months? Yes. Number of falls 1-this was the cause of the injury  LIVING ENVIRONMENT: Lives with: lives with their family and lives with their spouse   OCCUPATION:  Retired  PLOF:  Independent  PATIENT GOALS:  Return to walking, gardening, and other prior level of function  OBJECTIVE:  Note: Objective measures were completed at Evaluation unless otherwise noted.  TREATMENT:     08/07/23  Nustep L5 x8 minutes all four extremities Standing ankle PF/DF x15 on BAPs with other leg assisting Tandem stance blue foam pad 2x30 seconds B Tandem gait in // bars 3 laps on foam    Seated ankle circles on BAPs x10 CW/CCW Seated ankle PF into blue TB 12x2 second holds cues for eccentric control  Seated ankle DF into blue TB 12x2 second holds cues for eccentric control  Heel raises in // bars x10 (double leg) Toe raises in // bars x10 (double leg) Double heel up, single (L) down x10 BUE support on bars Seated ankle alphabets x2     Education on desensitization of post-op ankle- use materials of different textures and alternate rubbing each one over sore spots when watching TV at night, continued to encourage elevation/ice      08/03/23 Bike L4 x 6 min AROM on physiodisc, 20 reps in each direction. Ankle strengthening, ball squeezes, ever against ball, PF against ball x 10 each with 5 sec hold. Seated ankle PF against Blue Tband slow, full ROM, concentric and eccentric x 20 reps Seated DF against G tband resistance x 10 reps Parallel bars rocker board ant/post and then lateral x 20 reps each Goals re-assessed. VASO to L ankle for inflammation and pain relief.  07/31/23 Bike L3 x 7 minutes 4 in step ups 2x5 each Slant board Calf stretch 5x10'' HS curls 25lb 2x10 Leg Ext 5lb 2x10 L ankle Tband red 4 way 2x10 L ankle AROM all directions Sides step on balance beam in // bars  07/27/23 R ankle mobilization on physiodisc, in sit, 30 reps DF/PF, then inv/ever, then circles in  each direction. Seated Ball squeeze, 3 x 10 Seated ankle eversion against G tband resistance, 3 x 10 reps. Standing on step, stretching for gastroc and soleus Standing on floor, heel raises, educated to use LLE as much as possible, unable to eccentrically control L ankle. B side stepping on airex plank x 5 each way, no UE support  07/23/22 NuStep L5 x 6 min R foot and ankle joint mobs, stretch in  all planes. L ankle AROM all directions L ankle Tband rex 4 way 2x10 Standing march HHAx2  L ankle AROM on dynadisk Sit to stands  LLE LAQ 3lb 2x10 LLE Hs curls 2x10 red  07/20/23 Bike L3 x 6 minutes R foot and ankle joint mobs, stretch in all planes. Seated ankle ROM/strength on airex pad, DF/PF, Ever/Inv x 15 each. Seated L ankle inversion against yellow Tband Standing heel raises, B, with BUE support x 15, reported not pain, some stretch at post/inf calcaneus. VASO, med pressure, 10 min to L ankle for pain and inflammation.  07/13/23 Seated L ankle AROM on physiodisc, 20 reps DF/PF, and inv/ever, then clockwise and counter clockwise ROM x 10 each Gentle STM and joint mobs in R foot and ankle Gentle PROM for toes in flex and ext, PF and DF of ankle Active ankle ROM against light resistance, yellow Tband for ever, ball squeeze for inversion, PF with foot on floor, DF with 1# resistance, 2 x 10 each Seated HS curl with 20# resistance, BLE, 2 x 10 Attempted knee ext, but too heavy, so moved to mat with 3# weight on L 2 x 10 reps VASO to L ankle, 10 min, med pressure for pain and inflammation.  07/06/23 Seated L ankle AROM on physiodisc, 20 reps DF/PF, and inv/ever Towel scrunches, inversion ever with towel Toe curls up and down Joint mobs in L foot and ankle, passive, gentle stretch into DF Scar mobilization Heel raises, 20 with no weight, then 20 more with 3# weight on knee. Standing weight shifts with BUE support on Rollator, controlled WB through LLE, no more than 25% Seated knee flex  against 3# resistance Seated knee flexion against R tband-2 x 10 each, LLE Mini squats with boot on and Rollator for UE support x 10 reps  06/29/23:   PATIENT EDUCATION:  Education details:  Modified HEP Person educated: Patient and Spouse Education method: Programmer, multimedia, Facilities manager, Verbal cues Education comprehension:  verbalized understanding, returned demonstration, verbal cues required, and needs further education  HOME EXERCISE PROGRAM: [id number]  ASSESSMENT:  CLINICAL IMPRESSION:  Pt arrives today doing OK, still having issues with her ankle being fairly sensitive and with some swelling. Provided feet fleet reference as she had questions about where to get some custom arch supports- not sure if this store makes orthotics but might be a good place to start. Continued working on balance and ankle strengthening today. Will continue to progress as able/tolerated.      GOALS: Goals reviewed with patient? Yes  SHORT TERM GOALS: Target date: July 01, 2023  Ankle active dorsiflexion to 5 degrees Baseline: Shy of 0 at evaluation Goal status: 07/06/23-10 degrees met  2.  Able to stand with full weightbearing through foot pain less than or equal to 3 out of 10 Baseline: Unable to evaluation Goal status: 08/03/23-Met    LONG TERM GOALS: Target date: Plan of care date (08/19/23 per original cert)  Meet Foto goal Baseline: See objective Goal status: INITIAL  2.  Able to demonstrate proper ambulation pattern without pain greater than 3 out of 10 for household distances Baseline:  Goal status: 08/03/23-Ambulates with pain up to about 3/10, but limited distance, progressing  3.  Ankle dorsiflexion to 10 degrees Baseline:  Goal status: 08/03/23-17 degrees, met  4.  Able to navigate stairs ascending and descending without compensation Baseline:  Goal status: 08/03/23- Step to pattern using 2 rails. Ongoing  PLAN:  PT FREQUENCY: 1-2x/week  PT DURATION: Plan of care  date  PLANNED INTERVENTIONS: 97164- PT Re-evaluation, 97110-Therapeutic exercises, 97530- Therapeutic activity, [id number]- Neuromuscular re-education, [phone]- Self Care, 19147- Manual therapy, [phone]- Gait training, [phone]- Aquatic Therapy, 97014- Electrical stimulation (unattended), 97016- Vasopneumatic device, Patient/Family education, Balance training, Stair training, Taping, Dry Needling, Joint mobilization, Spinal mobilization, Scar mobilization, Cryotherapy, and Moist heat.  PLAN FOR NEXT SESSION:  Per MD note 07/19/23, ok to wean out of CAM as tolerated. Continue work on strength, balance, ankle mm endurance   Next MD visit 08/31/23   Nedra Hai, PT, DPT 08/07/23 3:51 PM

## 2023-08-10 ENCOUNTER — Encounter: Payer: Self-pay | Admitting: Physical Therapy

## 2023-08-10 ENCOUNTER — Ambulatory Visit: Payer: Medicare Other | Admitting: Physical Therapy

## 2023-08-10 DIAGNOSIS — R600 Localized edema: Secondary | ICD-10-CM

## 2023-08-10 DIAGNOSIS — M25672 Stiffness of left ankle, not elsewhere classified: Secondary | ICD-10-CM | POA: Diagnosis not present

## 2023-08-10 DIAGNOSIS — M25572 Pain in left ankle and joints of left foot: Secondary | ICD-10-CM

## 2023-08-10 DIAGNOSIS — R262 Difficulty in walking, not elsewhere classified: Secondary | ICD-10-CM

## 2023-08-10 NOTE — Therapy (Signed)
 OUTPATIENT PHYSICAL THERAPY TREATMENT      Patient Name: Shelly Pearson MRN: [id number] DOB:10/23/1940, 83 y.o., female Today's Date: 08/10/2023  END OF SESSION:  PT End of Session - 08/10/23 1115     Visit Number 12    Number of Visits 21    Date for PT Re-Evaluation 08/19/23    Authorization Type MCR    Progress Note Due on Visit 20    PT Start Time 1106    PT Stop Time 1145    PT Time Calculation (min) 39 min    Activity Tolerance Patient tolerated treatment well    Behavior During Therapy [facility] for tasks assessed/performed              Past Medical History:  Diagnosis Date   B12 deficiency 03/02/2020   GERD (gastroesophageal reflux disease)    Hypertension    Seasonal allergies    Thyroid  disease    Urge incontinence of urine 12/10/2020   Urgency incontinence    Past Surgical History:  Procedure Laterality Date   ABDOMINAL HYSTERECTOMY     CESAREAN SECTION     CYSTOCELE REPAIR     ORIF ANKLE FRACTURE Left 06/08/2023   Procedure: LEFT OPEN REDUCTION INTERNAL FIXATION (ORIF) ANKLE FRACTURE;  Surgeon: Genelle Standing, MD;  Location: [facility] ORS;  Service: Orthopedics;  Laterality: Left;   ROTATOR CUFF REPAIR Right    2018   Patient Active Problem List   Diagnosis Date Noted   Bimalleolar ankle fracture, left, closed, initial encounter 06/08/2023   Onychomycosis 10/07/2022   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), wet (HCC) 05/24/2022   Decreased grip strength of right hand 09/16/2021   Gait disturbance 07/12/2021   Mild cognitive impairment 07/12/2021   Thyroid  goiter 12/10/2020   Mixed hyperlipidemia 12/10/2020   Degenerative cervical spinal stenosis, mild C4-C5 and C5-C6 12/10/2020   Lumbar degenerative disc disease, L4-L5 with left lateral recess stenosis 12/10/2020   Personal history of fall 09/01/2020   Congenital cavus deformity of both feet 03/02/2020   Metatarsalgia of both feet 03/02/2020   Hand arthritis 03/02/2020   Arthritis of carpometacarpal  (CMC) joint of both thumbs 09/19/2019   Overactive bladder 09/19/2019   Sciatica 04/26/2019   Paresthesia of both feet 02/04/2019   Epidermal cyst 04/17/2018   Allergic rhinitis 06/23/2015   Eczema 06/23/2015   Gastro-esophageal reflux disease without esophagitis 06/23/2015   Essential hypertension 06/23/2015   Hypothyroidism 06/23/2015     REFERRING PROVIDER: Standing LITTIE Genelle, MD   REFERRING DIAG:  [phone] (ICD-10-CM) - Bimalleolar ankle fracture, left, closed, initial encounter     S/p ORIF L ankle bimalleolar fx  Rationale for Evaluation and Treatment: Rehabilitation  THERAPY DIAG:  Pain in left ankle and joints of left foot  Stiffness of left ankle, not elsewhere classified  Localized edema  Difficulty in walking, not elsewhere classified  ONSET DATE: DOS 06/08/23  SUBJECTIVE:  SUBJECTIVE STATEMENT:  Still being careful with walking, didn't wear my compression stockings yesterday as they were increasing pain. I don't think my surgical ankle will ever be the same size as the other, it just looks big. I'm reluctant to get new arch supports right now because I think my foot may change over time as I get further out from surgery    PERTINENT HISTORY:  History of mild cognitive impairment, paresthesia of both feet and sciatica in history.  Patient reports memory limitations  PAIN:  Are you having pain? Yes: NPRS scale: 3-4/10 Pain location: L ankle and arches of feet   Pain description: just sore  Aggravating factors: pressure against the right side of this ankle, being on feet too much Relieving factors: taking a break from doing too much     PRECAUTIONS:  None-ok to wean out of cam per MD note 07/19/23  RED FLAGS: None   WEIGHT BEARING RESTRICTIONS:  Yes WBAT with  boot  FALLS:  Has patient fallen in last 6 months? Yes. Number of falls 1-this was the cause of the injury  LIVING ENVIRONMENT: Lives with: lives with their family and lives with their spouse   OCCUPATION:  Retired  PLOF:  Independent  PATIENT GOALS:  Return to walking, gardening, and other prior level of function  OBJECTIVE:  Note: Objective measures were completed at Evaluation unless otherwise noted.  TREATMENT:      08/10/23  Nustep L5x8 minutes all four extremities a little closer today for dorsiflexion stretch on surgical ankle Forward lunges on 12 inch step with OP from PT anterior side of joint for ankle DF ROM x10 (to tolerance) Rocker board AP x15 for PF/DF ROM assist from RLE PRN Seated BAPS CCW and CW x10 each way L LE    Seated ankle alphabet x3 Heel raises double leg x15  Toe raises double leg x15   Scar massage/mobility Desensitization techniques for surgical ankle as tolerated Retrograde massage for edema          08/07/23  Nustep L5 x8 minutes all four extremities Standing ankle PF/DF x15 on BAPs with other leg assisting Tandem stance blue foam pad 2x30 seconds B Tandem gait in // bars 3 laps on foam    Seated ankle circles on BAPs x10 CW/CCW Seated ankle PF into blue TB 12x2 second holds cues for eccentric control  Seated ankle DF into blue TB 12x2 second holds cues for eccentric control  Heel raises in // bars x10 (double leg) Toe raises in // bars x10 (double leg) Double heel up, single (L) down x10 BUE support on bars Seated ankle alphabets x2     Education on desensitization of post-op ankle- use materials of different textures and alternate rubbing each one over sore spots when watching TV at night, continued to encourage elevation/ice      08/03/23 Bike L4 x 6 min AROM on physiodisc, 20 reps in each direction. Ankle strengthening, ball squeezes, ever against ball, PF against ball x 10 each with 5 sec hold. Seated ankle  PF against Blue Tband slow, full ROM, concentric and eccentric x 20 reps Seated DF against G tband resistance x 10 reps Parallel bars rocker board ant/post and then lateral x 20 reps each Goals re-assessed. VASO to L ankle for inflammation and pain relief.  07/31/23 Bike L3 x 7 minutes 4 in step ups 2x5 each Slant board Calf stretch 5x10'' HS curls 25lb 2x10 Leg Ext 5lb 2x10 L ankle Tband red 4  way 2x10 L ankle AROM all directions Sides step on balance beam in // bars  07/27/23 R ankle mobilization on physiodisc, in sit, 30 reps DF/PF, then inv/ever, then circles in each direction. Seated Ball squeeze, 3 x 10 Seated ankle eversion against G tband resistance, 3 x 10 reps. Standing on step, stretching for gastroc and soleus Standing on floor, heel raises, educated to use LLE as much as possible, unable to eccentrically control L ankle. B side stepping on airex plank x 5 each way, no UE support  07/23/22 NuStep L5 x 6 min R foot and ankle joint mobs, stretch in all planes. L ankle AROM all directions L ankle Tband rex 4 way 2x10 Standing march HHAx2  L ankle AROM on dynadisk Sit to stands  LLE LAQ 3lb 2x10 LLE Hs curls 2x10 red  07/20/23 Bike L3 x 6 minutes R foot and ankle joint mobs, stretch in all planes. Seated ankle ROM/strength on airex pad, DF/PF, Ever/Inv x 15 each. Seated L ankle inversion against yellow Tband Standing heel raises, B, with BUE support x 15, reported not pain, some stretch at post/inf calcaneus. VASO, med pressure, 10 min to L ankle for pain and inflammation.  07/13/23 Seated L ankle AROM on physiodisc, 20 reps DF/PF, and inv/ever, then clockwise and counter clockwise ROM x 10 each Gentle STM and joint mobs in R foot and ankle Gentle PROM for toes in flex and ext, PF and DF of ankle Active ankle ROM against light resistance, yellow Tband for ever, ball squeeze for inversion, PF with foot on floor, DF with 1# resistance, 2 x 10 each Seated HS curl with  20# resistance, BLE, 2 x 10 Attempted knee ext, but too heavy, so moved to mat with 3# weight on L 2 x 10 reps VASO to L ankle, 10 min, med pressure for pain and inflammation.  07/06/23 Seated L ankle AROM on physiodisc, 20 reps DF/PF, and inv/ever Towel scrunches, inversion ever with towel Toe curls up and down Joint mobs in L foot and ankle, passive, gentle stretch into DF Scar mobilization Heel raises, 20 with no weight, then 20 more with 3# weight on knee. Standing weight shifts with BUE support on Rollator, controlled WB through LLE, no more than 25% Seated knee flex against 3# resistance Seated knee flexion against R tband-2 x 10 each, LLE Mini squats with boot on and Rollator for UE support x 10 reps  06/29/23:   PATIENT EDUCATION:  Education details:  Modified HEP Person educated: Patient and Spouse Education method: Programmer, Multimedia, Facilities Manager, Verbal cues Education comprehension:  verbalized understanding, returned demonstration, verbal cues required, and needs further education  HOME EXERCISE PROGRAM: [id number]  ASSESSMENT:  CLINICAL IMPRESSION:  Pt arrives today doing OK, still having issues with her ankle being fairly sensitive and with some swelling. Forgot to do the desensitization techniques at home, we did some of these today along with scar mobilization and retrograde massage for edema control. Otherwise kept working on ankle mobility and strength overall. Will continue to progress as tolerated.      GOALS: Goals reviewed with patient? Yes  SHORT TERM GOALS: Target date: July 01, 2023  Ankle active dorsiflexion to 5 degrees Baseline: Shy of 0 at evaluation Goal status: 07/06/23-10 degrees met  2.  Able to stand with full weightbearing through foot pain less than or equal to 3 out of 10 Baseline: Unable to evaluation Goal status: 08/03/23-Met    LONG TERM GOALS: Target date: Plan of care date (  08/19/23 per original cert)  Meet Foto goal Baseline:  See objective Goal status: INITIAL  2.  Able to demonstrate proper ambulation pattern without pain greater than 3 out of 10 for household distances Baseline:  Goal status: 08/03/23-Ambulates with pain up to about 3/10, but limited distance, progressing  3.  Ankle dorsiflexion to 10 degrees Baseline:  Goal status: 08/03/23-17 degrees, met  4.  Able to navigate stairs ascending and descending without compensation Baseline:  Goal status: 08/03/23- Step to pattern using 2 rails. Ongoing  PLAN:  PT FREQUENCY: 1-2x/week  PT DURATION: Plan of care date  PLANNED INTERVENTIONS: 97164- PT Re-evaluation, 97110-Therapeutic exercises, 97530- Therapeutic activity, 97112- Neuromuscular re-education, 97535- Self Care, [id number]- Manual therapy, [phone]- Gait training, [phone]- Aquatic Therapy, 97014- Electrical stimulation (unattended), 97016- Vasopneumatic device, Patient/Family education, Balance training, Stair training, Taping, Dry Needling, Joint mobilization, Spinal mobilization, Scar mobilization, Cryotherapy, and Moist heat.  PLAN FOR NEXT SESSION:  Per MD note 07/19/23, ok to wean out of CAM as tolerated. Continue work on strength, balance, ankle mm endurance. Manual techniques PRN  Next MD visit 08/31/23   Josette Rough, PT, DPT 08/10/23 11:46 AM

## 2023-08-14 ENCOUNTER — Ambulatory Visit: Payer: Medicare Other

## 2023-08-14 DIAGNOSIS — R600 Localized edema: Secondary | ICD-10-CM | POA: Diagnosis not present

## 2023-08-14 DIAGNOSIS — R262 Difficulty in walking, not elsewhere classified: Secondary | ICD-10-CM

## 2023-08-14 DIAGNOSIS — M25572 Pain in left ankle and joints of left foot: Secondary | ICD-10-CM

## 2023-08-14 DIAGNOSIS — M25672 Stiffness of left ankle, not elsewhere classified: Secondary | ICD-10-CM | POA: Diagnosis not present

## 2023-08-14 NOTE — Therapy (Signed)
 OUTPATIENT PHYSICAL THERAPY TREATMENT      Patient Name: Shelly Pearson MRN: [id number] DOB:1940/11/05, 83 y.o., female Today's Date: 08/14/2023  END OF SESSION:  PT End of Session - 08/14/23 1445     Visit Number 13    Number of Visits 21    Date for PT Re-Evaluation 08/19/23    Authorization Type MCR    Progress Note Due on Visit 20    PT Start Time 1445    PT Stop Time 1530    PT Time Calculation (min) 45 min    Activity Tolerance Patient tolerated treatment well    Behavior During Therapy [facility] for tasks assessed/performed               Past Medical History:  Diagnosis Date   B12 deficiency 03/02/2020   GERD (gastroesophageal reflux disease)    Hypertension    Seasonal allergies    Thyroid  disease    Urge incontinence of urine 12/10/2020   Urgency incontinence    Past Surgical History:  Procedure Laterality Date   ABDOMINAL HYSTERECTOMY     CESAREAN SECTION     CYSTOCELE REPAIR     ORIF ANKLE FRACTURE Left 06/08/2023   Procedure: LEFT OPEN REDUCTION INTERNAL FIXATION (ORIF) ANKLE FRACTURE;  Surgeon: Wilhelmenia Harada, MD;  Location: [facility] ORS;  Service: Orthopedics;  Laterality: Left;   ROTATOR CUFF REPAIR Right    2018   Patient Active Problem List   Diagnosis Date Noted   Bimalleolar ankle fracture, left, closed, initial encounter 06/08/2023   Onychomycosis 10/07/2022   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), wet (HCC) 05/24/2022   Decreased grip strength of right hand 09/16/2021   Gait disturbance 07/12/2021   Mild cognitive impairment 07/12/2021   Thyroid  goiter 12/10/2020   Mixed hyperlipidemia 12/10/2020   Degenerative cervical spinal stenosis, mild C4-C5 and C5-C6 12/10/2020   Lumbar degenerative disc disease, L4-L5 with left lateral recess stenosis 12/10/2020   Personal history of fall 09/01/2020   Congenital cavus deformity of both feet 03/02/2020   Metatarsalgia of both feet 03/02/2020   Hand arthritis 03/02/2020   Arthritis of  carpometacarpal (CMC) joint of both thumbs 09/19/2019   Overactive bladder 09/19/2019   Sciatica 04/26/2019   Paresthesia of both feet 02/04/2019   Epidermal cyst 04/17/2018   Allergic rhinitis 06/23/2015   Eczema 06/23/2015   Gastro-esophageal reflux disease without esophagitis 06/23/2015   Essential hypertension 06/23/2015   Hypothyroidism 06/23/2015     REFERRING PROVIDER: Carmina Chris, MD   REFERRING DIAG:  [phone] (ICD-10-CM) - Bimalleolar ankle fracture, left, closed, initial encounter     S/p ORIF L ankle bimalleolar fx  Rationale for Evaluation and Treatment: Rehabilitation  THERAPY DIAG:  Pain in left ankle and joints of left foot  Stiffness of left ankle, not elsewhere classified  Localized edema  Difficulty in walking, not elsewhere classified  ONSET DATE: DOS 06/08/23  SUBJECTIVE:  SUBJECTIVE STATEMENT: My ankle is bothering me today. It is painful when I walk and achy.   PERTINENT HISTORY:  History of mild cognitive impairment, paresthesia of both feet and sciatica in history.  Patient reports memory limitations  PAIN:  Are you having pain? Yes: NPRS scale: 4/10 Pain location: L ankle and arches of feet   Pain description: "just sore"  Aggravating factors: pressure against the right side of this ankle, being on feet too much Relieving factors: taking a break from doing too much     PRECAUTIONS:  None-ok to wean out of cam per MD note 07/19/23  RED FLAGS: None   WEIGHT BEARING RESTRICTIONS:  Yes WBAT with boot  FALLS:  Has patient fallen in last 6 months? Yes. Number of falls 1-this was the cause of the injury  LIVING ENVIRONMENT: Lives with: lives with their family and lives with their spouse   OCCUPATION:  Retired  PLOF:  Independent  PATIENT  GOALS:  Return to walking, gardening, and other prior level of function  OBJECTIVE:  Note: Objective measures were completed at Evaluation unless otherwise noted.  TREATMENT:    08/14/23 Bike L4 x64mins  Calf stretches on slant 15s x3 Rocker board DF/PF x20 Seated ankle TB green 4 way x4  Standing on airex SLS 10s with UE support  Joint mobs in L foot and ankle, passive, gentle stretch into DF Step ups 4"  Walking on beam   08/10/23  Nustep L5x8 minutes all four extremities a little closer today for dorsiflexion stretch on surgical ankle Forward lunges on 12 inch step with OP from PT anterior side of joint for ankle DF ROM x10 (to tolerance) Rocker board AP x15 for PF/DF ROM assist from RLE PRN Seated BAPS CCW and CW x10 each way L LE    Seated ankle alphabet x3 Heel raises double leg x15  Toe raises double leg x15   Scar massage/mobility Desensitization techniques for surgical ankle as tolerated Retrograde massage for edema     08/07/23  Nustep L5 x8 minutes all four extremities Standing ankle PF/DF x15 on BAPs with other leg assisting Tandem stance blue foam pad 2x30 seconds B Tandem gait in // bars 3 laps on foam    Seated ankle circles on BAPs x10 CW/CCW Seated ankle PF into blue TB 12x2 second holds cues for eccentric control  Seated ankle DF into blue TB 12x2 second holds cues for eccentric control  Heel raises in // bars x10 (double leg) Toe raises in // bars x10 (double leg) Double heel up, single (L) down x10 BUE support on bars Seated ankle alphabets x2   Education on desensitization of post-op ankle- use materials of different textures and alternate rubbing each one over sore spots when watching TV at night, continued to encourage elevation/ice     08/03/23 Bike L4 x 6 min AROM on physiodisc, 20 reps in each direction. Ankle strengthening, ball squeezes, ever against ball, PF against ball x 10 each with 5 sec hold. Seated ankle PF against Blue Tband  slow, full ROM, concentric and eccentric x 20 reps Seated DF against G tband resistance x 10 reps Parallel bars rocker board ant/post and then lateral x 20 reps each Goals re-assessed. VASO to L ankle for inflammation and pain relief.  07/31/23 Bike L3 x 7 minutes 4 in step ups 2x5 each Slant board Calf stretch 5x10'' HS curls 25lb 2x10 Leg Ext 5lb 2x10 L ankle Tband red 4 way 2x10 L ankle AROM  all directions Sides step on balance beam in // bars  07/27/23 R ankle mobilization on physiodisc, in sit, 30 reps DF/PF, then inv/ever, then circles in each direction. Seated Ball squeeze, 3 x 10 Seated ankle eversion against G tband resistance, 3 x 10 reps. Standing on step, stretching for gastroc and soleus Standing on floor, heel raises, educated to use LLE as much as possible, unable to eccentrically control L ankle. B side stepping on airex plank x 5 each way, no UE support  07/23/22 NuStep L5 x 6 min R foot and ankle joint mobs, stretch in all planes. L ankle AROM all directions L ankle Tband rex 4 way 2x10 Standing march HHAx2  L ankle AROM on dynadisk Sit to stands  LLE LAQ 3lb 2x10 LLE Hs curls 2x10 red  07/20/23 Bike L3 x 6 minutes R foot and ankle joint mobs, stretch in all planes. Seated ankle ROM/strength on airex pad, DF/PF, Ever/Inv x 15 each. Seated L ankle inversion against yellow Tband Standing heel raises, B, with BUE support x 15, reported not pain, some stretch at post/inf calcaneus. VASO, med pressure, 10 min to L ankle for pain and inflammation.  07/13/23 Seated L ankle AROM on physiodisc, 20 reps DF/PF, and inv/ever, then clockwise and counter clockwise ROM x 10 each Gentle STM and joint mobs in R foot and ankle Gentle PROM for toes in flex and ext, PF and DF of ankle Active ankle ROM against light resistance, yellow Tband for ever, ball squeeze for inversion, PF with foot on floor, DF with 1# resistance, 2 x 10 each Seated HS curl with 20# resistance, BLE, 2  x 10 Attempted knee ext, but too heavy, so moved to mat with 3# weight on L 2 x 10 reps VASO to L ankle, 10 min, med pressure for pain and inflammation.  07/06/23 Seated L ankle AROM on physiodisc, 20 reps DF/PF, and inv/ever Towel scrunches, inversion ever with towel Toe curls up and down Joint mobs in L foot and ankle, passive, gentle stretch into DF Scar mobilization Heel raises, 20 with no weight, then 20 more with 3# weight on knee. Standing weight shifts with BUE support on Rollator, controlled WB through LLE, no more than 25% Seated knee flex against 3# resistance Seated knee flexion against R tband-2 x 10 each, LLE Mini squats with boot on and Rollator for UE support x 10 reps  06/29/23:   PATIENT EDUCATION:  Education details:  Modified HEP Person educated: Patient and Spouse Education method: Programmer, multimedia, Facilities manager, Verbal cues Education comprehension:  verbalized understanding, returned demonstration, verbal cues required, and needs further education  HOME EXERCISE PROGRAM: [id number]  ASSESSMENT:  CLINICAL IMPRESSION: Pt arrives today doing OK, still having issues with her ankle being sensitive and sore. Still has a little bit of swelling. Kept working on ankle mobility and strength as well as stability. She tolerates session well w/o complaints of pain. Will continue to progress as tolerated.     GOALS: Goals reviewed with patient? Yes  SHORT TERM GOALS: Target date: July 01, 2023  Ankle active dorsiflexion to 5 degrees Baseline: Shy of 0 at evaluation Goal status: 07/06/23-10 degrees met  2.  Able to stand with full weightbearing through foot pain less than or equal to 3 out of 10 Baseline: Unable to evaluation Goal status: 08/03/23-Met    LONG TERM GOALS: Target date: Plan of care date (08/19/23 per original cert)  Meet Foto goal Baseline: See objective Goal status: INITIAL  2.  Able to demonstrate proper ambulation pattern without pain greater  than 3 out of 10 for household distances Baseline:  Goal status: 08/03/23-Ambulates with pain up to about 3/10, but limited distance, progressing  3.  Ankle dorsiflexion to 10 degrees Baseline:  Goal status: 08/03/23-17 degrees, met  4.  Able to navigate stairs ascending and descending without compensation Baseline:  Goal status: 08/03/23- Step to pattern using 2 rails. Ongoing  PLAN:  PT FREQUENCY: 1-2x/week  PT DURATION: Plan of care date  PLANNED INTERVENTIONS: 97164- PT Re-evaluation, 97110-Therapeutic exercises, 97530- Therapeutic activity, 97112- Neuromuscular re-education, 97535- Self Care, 16109- Manual therapy, [phone]- Gait training, [phone]- Aquatic Therapy, 97014- Electrical stimulation (unattended), 97016- Vasopneumatic device, Patient/Family education, Balance training, Stair training, Taping, Dry Needling, Joint mobilization, Spinal mobilization, Scar mobilization, Cryotherapy, and Moist heat.  PLAN FOR NEXT SESSION:  Per MD note 07/19/23, ok to wean out of CAM as tolerated. Continue work on strength, balance, ankle mm endurance. Manual techniques PRN  Next MD visit 08/31/23   Terrel Ferries, PT, DPT 08/14/23 3:39 PM

## 2023-08-17 ENCOUNTER — Encounter: Payer: Self-pay | Admitting: Physical Therapy

## 2023-08-17 ENCOUNTER — Ambulatory Visit: Payer: Medicare Other | Admitting: Physical Therapy

## 2023-08-17 DIAGNOSIS — R600 Localized edema: Secondary | ICD-10-CM | POA: Diagnosis not present

## 2023-08-17 DIAGNOSIS — R262 Difficulty in walking, not elsewhere classified: Secondary | ICD-10-CM

## 2023-08-17 DIAGNOSIS — M25672 Stiffness of left ankle, not elsewhere classified: Secondary | ICD-10-CM

## 2023-08-17 DIAGNOSIS — M25572 Pain in left ankle and joints of left foot: Secondary | ICD-10-CM

## 2023-08-17 NOTE — Therapy (Signed)
OUTPATIENT PHYSICAL THERAPY TREATMENT      Patient Name: Shelly Pearson MRN: [id number] DOB:11/12/1940, 83 y.o., female Today's Date: 08/17/2023  END OF SESSION:  PT End of Session - 08/17/23 1209     Visit Number 14    Number of Visits 21    Date for PT Re-Evaluation 08/19/23    Authorization Type MCR    Progress Note Due on Visit 20    PT Start Time 1147    PT Stop Time 1226    PT Time Calculation (min) 39 min    Activity Tolerance Patient tolerated treatment well    Behavior During Therapy [facility] for tasks assessed/performed                Past Medical History:  Diagnosis Date   B12 deficiency 03/02/2020   GERD (gastroesophageal reflux disease)    Hypertension    Seasonal allergies    Thyroid disease    Urge incontinence of urine 12/10/2020   Urgency incontinence    Past Surgical History:  Procedure Laterality Date   ABDOMINAL HYSTERECTOMY     CESAREAN SECTION     CYSTOCELE REPAIR     ORIF ANKLE FRACTURE Left 06/08/2023   Procedure: LEFT OPEN REDUCTION INTERNAL FIXATION (ORIF) ANKLE FRACTURE;  Surgeon: Huel Cote, MD;  Location: [facility] ORS;  Service: Orthopedics;  Laterality: Left;   ROTATOR CUFF REPAIR Right    2018   Patient Active Problem List   Diagnosis Date Noted   Bimalleolar ankle fracture, left, closed, initial encounter 06/08/2023   Onychomycosis 10/07/2022   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), wet (HCC) 05/24/2022   Decreased grip strength of right hand 09/16/2021   Gait disturbance 07/12/2021   Mild cognitive impairment 07/12/2021   Thyroid goiter 12/10/2020   Mixed hyperlipidemia 12/10/2020   Degenerative cervical spinal stenosis, mild C4-C5 and C5-C6 12/10/2020   Lumbar degenerative disc disease, L4-L5 with left lateral recess stenosis 12/10/2020   Personal history of fall 09/01/2020   Congenital cavus deformity of both feet 03/02/2020   Metatarsalgia of both feet 03/02/2020   Hand arthritis 03/02/2020   Arthritis of  carpometacarpal (CMC) joint of both thumbs 09/19/2019   Overactive bladder 09/19/2019   Sciatica 04/26/2019   Paresthesia of both feet 02/04/2019   Epidermal cyst 04/17/2018   Allergic rhinitis 06/23/2015   Eczema 06/23/2015   Gastro-esophageal reflux disease without esophagitis 06/23/2015   Essential hypertension 06/23/2015   Hypothyroidism 06/23/2015     REFERRING PROVIDER: Benancio Deeds, MD   REFERRING DIAG:  [phone] (ICD-10-CM) - Bimalleolar ankle fracture, left, closed, initial encounter     S/p ORIF L ankle bimalleolar fx  Rationale for Evaluation and Treatment: Rehabilitation  THERAPY DIAG:  Pain in left ankle and joints of left foot  Stiffness of left ankle, not elsewhere classified  Localized edema  Difficulty in walking, not elsewhere classified  ONSET DATE: DOS 06/08/23  SUBJECTIVE:  SUBJECTIVE STATEMENT:   Things are feeling about the same, still getting a lot of pain around where the surgery was. If I'm on my feet more during the day I can tell at night. Went to [employer] after PT last time I really paid for it   PERTINENT HISTORY:  History of mild cognitive impairment, paresthesia of both feet and sciatica in history.  Patient reports memory limitations  PAIN:  Are you having pain? Yes: NPRS scale: 3/10 Pain location: L ankle and arches of feet, post-op site  Pain description: "just sore"  Aggravating factors: pressure against the right side of this ankle, being on feet too much Relieving factors: taking a break from doing too much     PRECAUTIONS:  None-ok to wean out of cam per MD note 07/19/23  RED FLAGS: None   WEIGHT BEARING RESTRICTIONS:  Yes WBAT with boot  FALLS:  Has patient fallen in last 6 months? Yes. Number of falls 1-this was the cause of the  injury  LIVING ENVIRONMENT: Lives with: lives with their family and lives with their spouse   OCCUPATION:  Retired  PLOF:  Independent  PATIENT GOALS:  Return to walking, gardening, and other prior level of function  OBJECTIVE:  Note: Objective measures were completed at Evaluation unless otherwise noted.  TREATMENT:       08/17/23  Scifit bike L4x8 minutes for w/u and tissue perfusion Rocker board AP rocks x2 minutes for ROM Inversion/eversion on blue air pad x20 Ankle PF/DF x20 on blue air pad x20 Forward lunges onto soft surface of bosu x12 B SLS with one foot on soft surface of bosu 3x30 seconds B     Gastroc stetches 3x30 seconds surgical LE Plantar fascia stretches 3x30 seconds B  Ankle 4 way strengthening black band x12 each       08/14/23 Bike L4 x94mins  Calf stretches on slant 15s x3 Rocker board DF/PF x20 Seated ankle TB green 4 way x4  Standing on airex SLS 10s with UE support  Joint mobs in L foot and ankle, passive, gentle stretch into DF Step ups 4"  Walking on beam   08/10/23  Nustep L5x8 minutes all four extremities a little closer today for dorsiflexion stretch on surgical ankle Forward lunges on 12 inch step with OP from PT anterior side of joint for ankle DF ROM x10 (to tolerance) Rocker board AP x15 for PF/DF ROM assist from RLE PRN Seated BAPS CCW and CW x10 each way L LE    Seated ankle alphabet x3 Heel raises double leg x15  Toe raises double leg x15   Scar massage/mobility Desensitization techniques for surgical ankle as tolerated Retrograde massage for edema     08/07/23  Nustep L5 x8 minutes all four extremities Standing ankle PF/DF x15 on BAPs with other leg assisting Tandem stance blue foam pad 2x30 seconds B Tandem gait in // bars 3 laps on foam    Seated ankle circles on BAPs x10 CW/CCW Seated ankle PF into blue TB 12x2 second holds cues for eccentric control  Seated ankle DF into blue TB 12x2 second holds  cues for eccentric control  Heel raises in // bars x10 (double leg) Toe raises in // bars x10 (double leg) Double heel up, single (L) down x10 BUE support on bars Seated ankle alphabets x2   Education on desensitization of post-op ankle- use materials of different textures and alternate rubbing each one over sore spots when watching TV at night, continued to  encourage elevation/ice     08/03/23 Bike L4 x 6 min AROM on physiodisc, 20 reps in each direction. Ankle strengthening, ball squeezes, ever against ball, PF against ball x 10 each with 5 sec hold. Seated ankle PF against Blue Tband slow, full ROM, concentric and eccentric x 20 reps Seated DF against G tband resistance x 10 reps Parallel bars rocker board ant/post and then lateral x 20 reps each Goals re-assessed. VASO to L ankle for inflammation and pain relief.  07/31/23 Bike L3 x 7 minutes 4 in step ups 2x5 each Slant board Calf stretch 5x10'' HS curls 25lb 2x10 Leg Ext 5lb 2x10 L ankle Tband red 4 way 2x10 L ankle AROM all directions Sides step on balance beam in // bars  07/27/23 R ankle mobilization on physiodisc, in sit, 30 reps DF/PF, then inv/ever, then circles in each direction. Seated Ball squeeze, 3 x 10 Seated ankle eversion against G tband resistance, 3 x 10 reps. Standing on step, stretching for gastroc and soleus Standing on floor, heel raises, educated to use LLE as much as possible, unable to eccentrically control L ankle. B side stepping on airex plank x 5 each way, no UE support  07/23/22 NuStep L5 x 6 min R foot and ankle joint mobs, stretch in all planes. L ankle AROM all directions L ankle Tband rex 4 way 2x10 Standing march HHAx2  L ankle AROM on dynadisk Sit to stands  LLE LAQ 3lb 2x10 LLE Hs curls 2x10 red  07/20/23 Bike L3 x 6 minutes R foot and ankle joint mobs, stretch in all planes. Seated ankle ROM/strength on airex pad, DF/PF, Ever/Inv x 15 each. Seated L ankle inversion against  yellow Tband Standing heel raises, B, with BUE support x 15, reported not pain, some stretch at post/inf calcaneus. VASO, med pressure, 10 min to L ankle for pain and inflammation.  07/13/23 Seated L ankle AROM on physiodisc, 20 reps DF/PF, and inv/ever, then clockwise and counter clockwise ROM x 10 each Gentle STM and joint mobs in R foot and ankle Gentle PROM for toes in flex and ext, PF and DF of ankle Active ankle ROM against light resistance, yellow Tband for ever, ball squeeze for inversion, PF with foot on floor, DF with 1# resistance, 2 x 10 each Seated HS curl with 20# resistance, BLE, 2 x 10 Attempted knee ext, but too heavy, so moved to mat with 3# weight on L 2 x 10 reps VASO to L ankle, 10 min, med pressure for pain and inflammation.  07/06/23 Seated L ankle AROM on physiodisc, 20 reps DF/PF, and inv/ever Towel scrunches, inversion ever with towel Toe curls up and down Joint mobs in L foot and ankle, passive, gentle stretch into DF Scar mobilization Heel raises, 20 with no weight, then 20 more with 3# weight on knee. Standing weight shifts with BUE support on Rollator, controlled WB through LLE, no more than 25% Seated knee flex against 3# resistance Seated knee flexion against R tband-2 x 10 each, LLE Mini squats with boot on and Rollator for UE support x 10 reps  06/29/23:   PATIENT EDUCATION:  Education details:  Modified HEP Person educated: Patient and Spouse Education method: Programmer, multimedia, Facilities manager, Verbal cues Education comprehension:  verbalized understanding, returned demonstration, verbal cues required, and needs further education  HOME EXERCISE PROGRAM: [id number]  ASSESSMENT:  CLINICAL IMPRESSION:   Continued working on ROM and strength today as able, also pre-gait tasks in WB positions. No increased pain with  any exercises today but she does continue to be concerned about edema and stiffness in her post-op ankle. Will continue efforts.      GOALS: Goals reviewed with patient? Yes  SHORT TERM GOALS: Target date: July 01, 2023  Ankle active dorsiflexion to 5 degrees Baseline: Shy of 0 at evaluation Goal status: 07/06/23-10 degrees met  2.  Able to stand with full weightbearing through foot pain less than or equal to 3 out of 10 Baseline: Unable to evaluation Goal status: 08/03/23-Met    LONG TERM GOALS: Target date: Plan of care date (08/19/23 per original cert)  Meet Foto goal Baseline: See objective Goal status: INITIAL  2.  Able to demonstrate proper ambulation pattern without pain greater than 3 out of 10 for household distances Baseline:  Goal status: 08/03/23-Ambulates with pain up to about 3/10, but limited distance, progressing  3.  Ankle dorsiflexion to 10 degrees Baseline:  Goal status: 08/03/23-17 degrees, met  4.  Able to navigate stairs ascending and descending without compensation Baseline:  Goal status: 08/03/23- Step to pattern using 2 rails. Ongoing  PLAN:  PT FREQUENCY: 1-2x/week  PT DURATION: Plan of care date  PLANNED INTERVENTIONS: 97164- PT Re-evaluation, 97110-Therapeutic exercises, 97530- Therapeutic activity, 97112- Neuromuscular re-education, 97535- Self Care, 16109- Manual therapy, [phone]- Gait training, [phone]- Aquatic Therapy, 97014- Electrical stimulation (unattended), 97016- Vasopneumatic device, Patient/Family education, Balance training, Stair training, Taping, Dry Needling, Joint mobilization, Spinal mobilization, Scar mobilization, Cryotherapy, and Moist heat.  PLAN FOR NEXT SESSION:  Per MD note 07/19/23, ok to wean out of CAM as tolerated. Continue work on strength, balance, ankle mm endurance. Manual techniques PRN. How is HEP?   Next MD visit 08/31/23   Nedra Hai, PT, DPT 08/17/23 12:27 PM

## 2023-08-21 ENCOUNTER — Ambulatory Visit: Payer: Medicare Other | Admitting: Physical Therapy

## 2023-08-21 ENCOUNTER — Encounter: Payer: Self-pay | Admitting: Physical Therapy

## 2023-08-21 DIAGNOSIS — R262 Difficulty in walking, not elsewhere classified: Secondary | ICD-10-CM | POA: Diagnosis not present

## 2023-08-21 DIAGNOSIS — R600 Localized edema: Secondary | ICD-10-CM | POA: Diagnosis not present

## 2023-08-21 DIAGNOSIS — M25572 Pain in left ankle and joints of left foot: Secondary | ICD-10-CM

## 2023-08-21 DIAGNOSIS — M25672 Stiffness of left ankle, not elsewhere classified: Secondary | ICD-10-CM

## 2023-08-21 NOTE — Therapy (Addendum)
OUTPATIENT PHYSICAL THERAPY TREATMENT      Patient Name: Shelly Pearson MRN: [id number] DOB:02-16-1941, 83 y.o., female Today's Date: 08/21/2023  END OF SESSION:  PT End of Session - 08/21/23 1515     Visit Number 15    Date for PT Re-Evaluation 08/19/23    PT Start Time 1515    PT Stop Time 1600    PT Time Calculation (min) 45 min    Activity Tolerance Patient tolerated treatment well    Behavior During Therapy [facility] for tasks assessed/performed                Past Medical History:  Diagnosis Date   B12 deficiency 03/02/2020   GERD (gastroesophageal reflux disease)    Hypertension    Seasonal allergies    Thyroid disease    Urge incontinence of urine 12/10/2020   Urgency incontinence    Past Surgical History:  Procedure Laterality Date   ABDOMINAL HYSTERECTOMY     CESAREAN SECTION     CYSTOCELE REPAIR     ORIF ANKLE FRACTURE Left 06/08/2023   Procedure: LEFT OPEN REDUCTION INTERNAL FIXATION (ORIF) ANKLE FRACTURE;  Surgeon: Huel Cote, MD;  Location: [facility] ORS;  Service: Orthopedics;  Laterality: Left;   ROTATOR CUFF REPAIR Right    2018   Patient Active Problem List   Diagnosis Date Noted   Bimalleolar ankle fracture, left, closed, initial encounter 06/08/2023   Onychomycosis 10/07/2022   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), wet (HCC) 05/24/2022   Decreased grip strength of right hand 09/16/2021   Gait disturbance 07/12/2021   Mild cognitive impairment 07/12/2021   Thyroid goiter 12/10/2020   Mixed hyperlipidemia 12/10/2020   Degenerative cervical spinal stenosis, mild C4-C5 and C5-C6 12/10/2020   Lumbar degenerative disc disease, L4-L5 with left lateral recess stenosis 12/10/2020   Personal history of fall 09/01/2020   Congenital cavus deformity of both feet 03/02/2020   Metatarsalgia of both feet 03/02/2020   Hand arthritis 03/02/2020   Arthritis of carpometacarpal (CMC) joint of both thumbs 09/19/2019   Overactive bladder 09/19/2019    Sciatica 04/26/2019   Paresthesia of both feet 02/04/2019   Epidermal cyst 04/17/2018   Allergic rhinitis 06/23/2015   Eczema 06/23/2015   Gastro-esophageal reflux disease without esophagitis 06/23/2015   Essential hypertension 06/23/2015   Hypothyroidism 06/23/2015     REFERRING PROVIDER: Benancio Deeds, MD   REFERRING DIAG:  [phone] (ICD-10-CM) - Bimalleolar ankle fracture, left, closed, initial encounter     S/p ORIF L ankle bimalleolar fx  Rationale for Evaluation and Treatment: Rehabilitation  THERAPY DIAG:  Pain in left ankle and joints of left foot  Stiffness of left ankle, not elsewhere classified  Localized edema  Difficulty in walking, not elsewhere classified  ONSET DATE: DOS 06/08/23  SUBJECTIVE:  SUBJECTIVE STATEMENT:   Most of the time pretty good, are time when ankle acts up. Pt reports really hurting after last session keeping her awake  PERTINENT HISTORY:  History of mild cognitive impairment, paresthesia of both feet and sciatica in history.  Patient reports memory limitations  PAIN:  Are you having pain? Yes: NPRS scale: 0/10 Pain location: L ankle and arches of feet, post-op site  Pain description: "just sore"  Aggravating factors: pressure against the right side of this ankle, being on feet too much Relieving factors: taking a break from doing too much     PRECAUTIONS:  None-ok to wean out of cam per MD note 07/19/23  RED FLAGS: None   WEIGHT BEARING RESTRICTIONS:  Yes WBAT with boot  FALLS:  Has patient fallen in last 6 months? Yes. Number of falls 1-this was the cause of the injury  LIVING ENVIRONMENT: Lives with: lives with their family and lives with their spouse   OCCUPATION:  Retired  PLOF:  Independent  PATIENT GOALS:  Return to  walking, gardening, and other prior level of function  OBJECTIVE:  Note: Objective measures were completed at Evaluation unless otherwise noted.  TREATMENT:    08/21/23 Bike L 2.3 x 7 min Slant Board Calf stretch 4x15'' 20lb resisted gait 4 way x 3 each Heel raises 2x15  6in lateral step ups x10 each  L ankle PROM with end range holds, L ankle Jt mobs. 4 way ankle green 2x10 4in step downs Tmill pushes x20  08/17/23  Scifit bike L4x8 minutes for w/u and tissue perfusion Rocker board AP rocks x2 minutes for ROM Inversion/eversion on blue air pad x20 Ankle PF/DF x20 on blue air pad x20 Forward lunges onto soft surface of bosu x12 B SLS with one foot on soft surface of bosu 3x30 seconds B   Gastroc stetches 3x30 seconds surgical LE Plantar fascia stretches 3x30 seconds B  Ankle 4 way strengthening black band x12 each    08/14/23 Bike L4 x35mins  Calf stretches on slant 15s x3 Rocker board DF/PF x20 Seated ankle TB green 4 way x4  Standing on airex SLS 10s with UE support  Joint mobs in L foot and ankle, passive, gentle stretch into DF Step ups 4"  Walking on beam   08/10/23  Nustep L5x8 minutes all four extremities a little closer today for dorsiflexion stretch on surgical ankle Forward lunges on 12 inch step with OP from PT anterior side of joint for ankle DF ROM x10 (to tolerance) Rocker board AP x15 for PF/DF ROM assist from RLE PRN Seated BAPS CCW and CW x10 each way L LE    Seated ankle alphabet x3 Heel raises double leg x15  Toe raises double leg x15   Scar massage/mobility Desensitization techniques for surgical ankle as tolerated Retrograde massage for edema     08/07/23  Nustep L5 x8 minutes all four extremities Standing ankle PF/DF x15 on BAPs with other leg assisting Tandem stance blue foam pad 2x30 seconds B Tandem gait in // bars 3 laps on foam    Seated ankle circles on BAPs x10 CW/CCW Seated ankle PF into blue TB 12x2 second holds cues for  eccentric control  Seated ankle DF into blue TB 12x2 second holds cues for eccentric control  Heel raises in // bars x10 (double leg) Toe raises in // bars x10 (double leg) Double heel up, single (L) down x10 BUE support on bars Seated ankle alphabets x2   Education on  desensitization of post-op ankle- use materials of different textures and alternate rubbing each one over sore spots when watching TV at night, continued to encourage elevation/ice     08/03/23 Bike L4 x 6 min AROM on physiodisc, 20 reps in each direction. Ankle strengthening, ball squeezes, ever against ball, PF against ball x 10 each with 5 sec hold. Seated ankle PF against Blue Tband slow, full ROM, concentric and eccentric x 20 reps Seated DF against G tband resistance x 10 reps Parallel bars rocker board ant/post and then lateral x 20 reps each Goals re-assessed. VASO to L ankle for inflammation and pain relief.  07/31/23 Bike L3 x 7 minutes 4 in step ups 2x5 each Slant board Calf stretch 5x10'' HS curls 25lb 2x10 Leg Ext 5lb 2x10 L ankle Tband red 4 way 2x10 L ankle AROM all directions Sides step on balance beam in // bars  07/27/23 R ankle mobilization on physiodisc, in sit, 30 reps DF/PF, then inv/ever, then circles in each direction. Seated Ball squeeze, 3 x 10 Seated ankle eversion against G tband resistance, 3 x 10 reps. Standing on step, stretching for gastroc and soleus Standing on floor, heel raises, educated to use LLE as much as possible, unable to eccentrically control L ankle. B side stepping on airex plank x 5 each way, no UE support  07/23/22 NuStep L5 x 6 min R foot and ankle joint mobs, stretch in all planes. L ankle AROM all directions L ankle Tband rex 4 way 2x10 Standing march HHAx2  L ankle AROM on dynadisk Sit to stands  LLE LAQ 3lb 2x10 LLE Hs curls 2x10 red  07/20/23 Bike L3 x 6 minutes R foot and ankle joint mobs, stretch in all planes. Seated ankle ROM/strength on airex  pad, DF/PF, Ever/Inv x 15 each. Seated L ankle inversion against yellow Tband Standing heel raises, B, with BUE support x 15, reported not pain, some stretch at post/inf calcaneus. VASO, med pressure, 10 min to L ankle for pain and inflammation.  07/13/23 Seated L ankle AROM on physiodisc, 20 reps DF/PF, and inv/ever, then clockwise and counter clockwise ROM x 10 each Gentle STM and joint mobs in R foot and ankle Gentle PROM for toes in flex and ext, PF and DF of ankle Active ankle ROM against light resistance, yellow Tband for ever, ball squeeze for inversion, PF with foot on floor, DF with 1# resistance, 2 x 10 each Seated HS curl with 20# resistance, BLE, 2 x 10 Attempted knee ext, but too heavy, so moved to mat with 3# weight on L 2 x 10 reps VASO to L ankle, 10 min, med pressure for pain and inflammation.  07/06/23 Seated L ankle AROM on physiodisc, 20 reps DF/PF, and inv/ever Towel scrunches, inversion ever with towel Toe curls up and down Joint mobs in L foot and ankle, passive, gentle stretch into DF Scar mobilization Heel raises, 20 with no weight, then 20 more with 3# weight on knee. Standing weight shifts with BUE support on Rollator, controlled WB through LLE, no more than 25% Seated knee flex against 3# resistance Seated knee flexion against R tband-2 x 10 each, LLE Mini squats with boot on and Rollator for UE support x 10 reps  06/29/23:   PATIENT EDUCATION:  Education details:  Modified HEP Person educated: Patient and Spouse Education method: Explanation, Facilities manager, Verbal cues Education comprehension:  verbalized understanding, returned demonstration, verbal cues required, and needs further education  HOME EXERCISE PROGRAM: [id number]  ASSESSMENT:  CLINICAL IMPRESSION:   Continued working on ROM and strength today as able. Cue needed not to drag Le with side steps. Pt as adequate ROM for appropriate gait but doe have decrease L heel strike and toe off, pt  reports she feels a pull at times. All interventions completed well. She did report a pull with Tmill pushes. Will continue efforts.     GOALS: Goals reviewed with patient? Yes  SHORT TERM GOALS: Target date: July 01, 2023  Ankle active dorsiflexion to 5 degrees Baseline: Shy of 0 at evaluation Goal status: 07/06/23-10 degrees met  2.  Able to stand with full weightbearing through foot pain less than or equal to 3 out of 10 Baseline: Unable to evaluation Goal status: 08/03/23-Met    LONG TERM GOALS: Target date: Plan of care date (08/19/23 per original cert)  Meet Foto goal Baseline: See objective Goal status: INITIAL  2.  Able to demonstrate proper ambulation pattern without pain greater than 3 out of 10 for household distances Baseline:  Goal status: 08/03/23-Ambulates with pain up to about 3/10, but limited distance, progressing  3.  Ankle dorsiflexion to 10 degrees Baseline:  Goal status: 08/03/23-17 degrees, met  4.  Able to navigate stairs ascending and descending without compensation Baseline:  Goal status: 08/03/23- Step to pattern using 2 rails. Ongoing  PLAN:  PT FREQUENCY: 1-2x/week  PT DURATION: Plan of care date  PLANNED INTERVENTIONS: 97164- PT Re-evaluation, 97110-Therapeutic exercises, 97530- Therapeutic activity, 97112- Neuromuscular re-education, 97535- Self Care, 81191- Manual therapy, [phone]- Gait training, [phone]- Aquatic Therapy, 97014- Electrical stimulation (unattended), 97016- Vasopneumatic device, Patient/Family education, Balance training, Stair training, Taping, Dry Needling, Joint mobilization, Spinal mobilization, Scar mobilization, Cryotherapy, and Moist heat.  PLAN FOR NEXT SESSION:  Per MD note 07/19/23, ok to wean out of CAM as tolerated. Continue work on strength, balance, ankle mm endurance. Manual techniques PRN. How is HEP?   Next MD visit 08/31/23   Nedra Hai, PT, DPT 08/21/23 3:17 PM

## 2023-08-24 ENCOUNTER — Ambulatory Visit: Payer: Medicare Other

## 2023-08-24 DIAGNOSIS — R600 Localized edema: Secondary | ICD-10-CM

## 2023-08-24 DIAGNOSIS — M25672 Stiffness of left ankle, not elsewhere classified: Secondary | ICD-10-CM

## 2023-08-24 DIAGNOSIS — M25572 Pain in left ankle and joints of left foot: Secondary | ICD-10-CM

## 2023-08-24 DIAGNOSIS — R262 Difficulty in walking, not elsewhere classified: Secondary | ICD-10-CM | POA: Diagnosis not present

## 2023-08-24 NOTE — Therapy (Signed)
OUTPATIENT PHYSICAL THERAPY TREATMENT      Patient Name: Shelly Pearson MRN: [id number] DOB:[birth year], 83 y.o., female Today's Date: 08/24/2023  END OF SESSION:  PT End of Session - 08/24/23 1221     Visit Number 16    Number of Visits 21    Date for PT Re-Evaluation 08/19/23    PT Start Time 1220    PT Stop Time 1305    PT Time Calculation (min) 45 min    Activity Tolerance Patient tolerated treatment well    Behavior During Therapy [facility] for tasks assessed/performed                 Past Medical History:  Diagnosis Date   B12 deficiency 03/02/2020   GERD (gastroesophageal reflux disease)    Hypertension    Seasonal allergies    Thyroid disease    Urge incontinence of urine 12/10/2020   Urgency incontinence    Past Surgical History:  Procedure Laterality Date   ABDOMINAL HYSTERECTOMY     CESAREAN SECTION     CYSTOCELE REPAIR     ORIF ANKLE FRACTURE Left 06/08/2023   Procedure: LEFT OPEN REDUCTION INTERNAL FIXATION (ORIF) ANKLE FRACTURE;  Surgeon: Huel Cote, MD;  Location: [facility] ORS;  Service: Orthopedics;  Laterality: Left;   ROTATOR CUFF REPAIR Right    2018   Patient Active Problem List   Diagnosis Date Noted   Bimalleolar ankle fracture, left, closed, initial encounter 06/08/2023   Onychomycosis 10/07/2022   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), wet (HCC) 05/24/2022   Decreased grip strength of right hand 09/16/2021   Gait disturbance 07/12/2021   Mild cognitive impairment 07/12/2021   Thyroid goiter 12/10/2020   Mixed hyperlipidemia 12/10/2020   Degenerative cervical spinal stenosis, mild C4-C5 and C5-C6 12/10/2020   Lumbar degenerative disc disease, L4-L5 with left lateral recess stenosis 12/10/2020   Personal history of fall 09/01/2020   Congenital cavus deformity of both feet 03/02/2020   Metatarsalgia of both feet 03/02/2020   Hand arthritis 03/02/2020   Arthritis of carpometacarpal (CMC) joint of both thumbs 09/19/2019   Overactive  bladder 09/19/2019   Sciatica 04/26/2019   Paresthesia of both feet 02/04/2019   Epidermal cyst 04/17/2018   Allergic rhinitis 06/23/2015   Eczema 06/23/2015   Gastro-esophageal reflux disease without esophagitis 06/23/2015   Essential hypertension 06/23/2015   Hypothyroidism 06/23/2015     REFERRING PROVIDER: Benancio Deeds, MD   REFERRING DIAG:  [phone] (ICD-10-CM) - Bimalleolar ankle fracture, left, closed, initial encounter     S/p ORIF L ankle bimalleolar fx  Rationale for Evaluation and Treatment: Rehabilitation  THERAPY DIAG:  Pain in left ankle and joints of left foot  Stiffness of left ankle, not elsewhere classified  Localized edema  Difficulty in walking, not elsewhere classified  ONSET DATE: DOS 06/08/23  SUBJECTIVE:  SUBJECTIVE STATEMENT: Feeling fine.   PERTINENT HISTORY:  History of mild cognitive impairment, paresthesia of both feet and sciatica in history.  Patient reports memory limitations  PAIN:  Are you having pain? Yes: NPRS scale: 0/10 Pain location: L ankle and arches of feet, post-op site  Pain description: "just sore"  Aggravating factors: pressure against the right side of this ankle, being on feet too much Relieving factors: taking a break from doing too much     PRECAUTIONS:  None-ok to wean out of cam per MD note 07/19/23  RED FLAGS: None   WEIGHT BEARING RESTRICTIONS:  Yes WBAT with boot  FALLS:  Has patient fallen in last 6 months? Yes. Number of falls 1-this was the cause of the injury  LIVING ENVIRONMENT: Lives with: lives with their family and lives with their spouse   OCCUPATION:  Retired  PLOF:  Independent  PATIENT GOALS:  Return to walking, gardening, and other prior level of function  OBJECTIVE:  Note: Objective measures  were completed at Evaluation unless otherwise noted.  TREATMENT:    08/24/23 Bike L3 x67mins  Step up on 6" step from airex forward and lateral Calf stretch 15 x4 (2x on lower level and then 2x on higher slant)  Resisted gait 20# 4 way x4  Calf raises 2x15 Toe raises 2x15 Forward lunges on to step Tandem stance on beam SLS on beam Small jumps on trampoline  STS on airex 2x5   08/21/23 Bike L 2.3 x 7 min Slant Board Calf stretch 4x15'' 20lb resisted gait 4 way x 3 each Heel raises 2x15  6in lateral step ups x10 each  L ankle PROM with end range holds, L ankle Jt mobs. 4 way ankle green 2x10 4in step downs Tmill pushes x20  08/17/23  Scifit bike L4x8 minutes for w/u and tissue perfusion Rocker board AP rocks x2 minutes for ROM Inversion/eversion on blue air pad x20 Ankle PF/DF x20 on blue air pad x20 Forward lunges onto soft surface of bosu x12 B SLS with one foot on soft surface of bosu 3x30 seconds B   Gastroc stetches 3x30 seconds surgical LE Plantar fascia stretches 3x30 seconds B  Ankle 4 way strengthening black band x12 each    08/14/23 Bike L4 x72mins  Calf stretches on slant 15s x3 Rocker board DF/PF x20 Seated ankle TB green 4 way x4  Standing on airex SLS 10s with UE support  Joint mobs in L foot and ankle, passive, gentle stretch into DF Step ups 4"  Walking on beam   08/10/23  Nustep L5x8 minutes all four extremities a little closer today for dorsiflexion stretch on surgical ankle Forward lunges on 12 inch step with OP from PT anterior side of joint for ankle DF ROM x10 (to tolerance) Rocker board AP x15 for PF/DF ROM assist from RLE PRN Seated BAPS CCW and CW x10 each way L LE    Seated ankle alphabet x3 Heel raises double leg x15  Toe raises double leg x15   Scar massage/mobility Desensitization techniques for surgical ankle as tolerated Retrograde massage for edema     08/07/23  Nustep L5 x8 minutes all four extremities Standing ankle  PF/DF x15 on BAPs with other leg assisting Tandem stance blue foam pad 2x30 seconds B Tandem gait in // bars 3 laps on foam    Seated ankle circles on BAPs x10 CW/CCW Seated ankle PF into blue TB 12x2 second holds cues for eccentric control  Seated ankle DF into  blue TB 12x2 second holds cues for eccentric control  Heel raises in // bars x10 (double leg) Toe raises in // bars x10 (double leg) Double heel up, single (L) down x10 BUE support on bars Seated ankle alphabets x2   Education on desensitization of post-op ankle- use materials of different textures and alternate rubbing each one over sore spots when watching TV at night, continued to encourage elevation/ice     08/03/23 Bike L4 x 6 min AROM on physiodisc, 20 reps in each direction. Ankle strengthening, ball squeezes, ever against ball, PF against ball x 10 each with 5 sec hold. Seated ankle PF against Blue Tband slow, full ROM, concentric and eccentric x 20 reps Seated DF against G tband resistance x 10 reps Parallel bars rocker board ant/post and then lateral x 20 reps each Goals re-assessed. VASO to L ankle for inflammation and pain relief.  07/31/23 Bike L3 x 7 minutes 4 in step ups 2x5 each Slant board Calf stretch 5x10'' HS curls 25lb 2x10 Leg Ext 5lb 2x10 L ankle Tband red 4 way 2x10 L ankle AROM all directions Sides step on balance beam in // bars  07/27/23 R ankle mobilization on physiodisc, in sit, 30 reps DF/PF, then inv/ever, then circles in each direction. Seated Ball squeeze, 3 x 10 Seated ankle eversion against G tband resistance, 3 x 10 reps. Standing on step, stretching for gastroc and soleus Standing on floor, heel raises, educated to use LLE as much as possible, unable to eccentrically control L ankle. B side stepping on airex plank x 5 each way, no UE support  07/23/22 NuStep L5 x 6 min R foot and ankle joint mobs, stretch in all planes. L ankle AROM all directions L ankle Tband rex 4 way  2x10 Standing march HHAx2  L ankle AROM on dynadisk Sit to stands  LLE LAQ 3lb 2x10 LLE Hs curls 2x10 red  07/20/23 Bike L3 x 6 minutes R foot and ankle joint mobs, stretch in all planes. Seated ankle ROM/strength on airex pad, DF/PF, Ever/Inv x 15 each. Seated L ankle inversion against yellow Tband Standing heel raises, B, with BUE support x 15, reported not pain, some stretch at post/inf calcaneus. VASO, med pressure, 10 min to L ankle for pain and inflammation.  07/13/23 Seated L ankle AROM on physiodisc, 20 reps DF/PF, and inv/ever, then clockwise and counter clockwise ROM x 10 each Gentle STM and joint mobs in R foot and ankle Gentle PROM for toes in flex and ext, PF and DF of ankle Active ankle ROM against light resistance, yellow Tband for ever, ball squeeze for inversion, PF with foot on floor, DF with 1# resistance, 2 x 10 each Seated HS curl with 20# resistance, BLE, 2 x 10 Attempted knee ext, but too heavy, so moved to mat with 3# weight on L 2 x 10 reps VASO to L ankle, 10 min, med pressure for pain and inflammation.  07/06/23 Seated L ankle AROM on physiodisc, 20 reps DF/PF, and inv/ever Towel scrunches, inversion ever with towel Toe curls up and down Joint mobs in L foot and ankle, passive, gentle stretch into DF Scar mobilization Heel raises, 20 with no weight, then 20 more with 3# weight on knee. Standing weight shifts with BUE support on Rollator, controlled WB through LLE, no more than 25% Seated knee flex against 3# resistance Seated knee flexion against R tband-2 x 10 each, LLE Mini squats with boot on and Rollator for UE support x 10  reps  06/29/23:   PATIENT EDUCATION:  Education details:  Modified HEP Person educated: Patient and Spouse Education method: Explanation, Facilities manager, Verbal cues Education comprehension:  verbalized understanding, returned demonstration, verbal cues required, and needs further education  HOME EXERCISE  PROGRAM: [id number]  ASSESSMENT:  CLINICAL IMPRESSION:   Continued working on ROM and strength today as able. Cue needed not to drag Le with side steps. Pt as adequate ROM for appropriate gait but doe have decrease L heel strike and toe off, pt reports she feels a pull at times. All interventions completed well. She did report a pull with Tmill pushes. Will continue efforts.     GOALS: Goals reviewed with patient? Yes  SHORT TERM GOALS: Target date: July 01, 2023  Ankle active dorsiflexion to 5 degrees Baseline: Shy of 0 at evaluation Goal status: 07/06/23-10 degrees met  2.  Able to stand with full weightbearing through foot pain less than or equal to 3 out of 10 Baseline: Unable to evaluation Goal status: 08/03/23-Met    LONG TERM GOALS: Target date: Plan of care date (08/19/23 per original cert)  Meet Foto goal Baseline: See objective Goal status: INITIAL  2.  Able to demonstrate proper ambulation pattern without pain greater than 3 out of 10 for household distances Baseline:  Goal status: 08/03/23-Ambulates with pain up to about 3/10, but limited distance, progressing  3.  Ankle dorsiflexion to 10 degrees Baseline:  Goal status: 08/03/23-17 degrees, met  4.  Able to navigate stairs ascending and descending without compensation Baseline:  Goal status: 08/03/23- Step to pattern using 2 rails. Ongoing  PLAN:  PT FREQUENCY: 1-2x/week  PT DURATION: Plan of care date  PLANNED INTERVENTIONS: 97164- PT Re-evaluation, 97110-Therapeutic exercises, 97530- Therapeutic activity, 97112- Neuromuscular re-education, 97535- Self Care, 16109- Manual therapy, [phone]- Gait training, [phone]- Aquatic Therapy, 97014- Electrical stimulation (unattended), 97016- Vasopneumatic device, Patient/Family education, Balance training, Stair training, Taping, Dry Needling, Joint mobilization, Spinal mobilization, Scar mobilization, Cryotherapy, and Moist heat.  PLAN FOR NEXT SESSION:  Per MD note  07/19/23, ok to wean out of CAM as tolerated. Continue work on strength, balance, ankle mm endurance. Manual techniques PRN. How is HEP?   Next MD visit 08/31/23   Nedra Hai, PT, DPT 08/24/23 1:05 PM

## 2023-08-28 ENCOUNTER — Encounter: Payer: Self-pay | Admitting: Physical Therapy

## 2023-08-28 ENCOUNTER — Ambulatory Visit: Payer: Medicare Other | Admitting: Physical Therapy

## 2023-08-28 DIAGNOSIS — M25672 Stiffness of left ankle, not elsewhere classified: Secondary | ICD-10-CM | POA: Diagnosis not present

## 2023-08-28 DIAGNOSIS — R600 Localized edema: Secondary | ICD-10-CM

## 2023-08-28 DIAGNOSIS — R262 Difficulty in walking, not elsewhere classified: Secondary | ICD-10-CM | POA: Diagnosis not present

## 2023-08-28 DIAGNOSIS — M25572 Pain in left ankle and joints of left foot: Secondary | ICD-10-CM

## 2023-08-28 NOTE — Therapy (Signed)
 OUTPATIENT PHYSICAL THERAPY TREATMENT/PROGRESS NOTE/RECERT       Patient Name: Shelly Pearson MRN: [id number] DOB:[birth year], 83 y.o., female Today's Date: 08/28/2023  Progress Note Reporting Period 08/03/23 to 08/28/23  See note below for Objective Data and Assessment of Progress/Goals.      END OF SESSION:  PT End of Session - 08/28/23 1525     Visit Number 17    Number of Visits 21    Date for PT Re-Evaluation 08/19/23    Authorization Type MCR    Authorization Time Period extended 08/28/23    Progress Note Due on Visit 27    PT Start Time 1516    PT Stop Time 1558    PT Time Calculation (min) 42 min    Activity Tolerance Patient tolerated treatment well    Behavior During Therapy [facility] for tasks assessed/performed                  Past Medical History:  Diagnosis Date   B12 deficiency 03/02/2020   GERD (gastroesophageal reflux disease)    Hypertension    Seasonal allergies    Thyroid disease    Urge incontinence of urine 12/10/2020   Urgency incontinence    Past Surgical History:  Procedure Laterality Date   ABDOMINAL HYSTERECTOMY     CESAREAN SECTION     CYSTOCELE REPAIR     ORIF ANKLE FRACTURE Left 06/08/2023   Procedure: LEFT OPEN REDUCTION INTERNAL FIXATION (ORIF) ANKLE FRACTURE;  Surgeon: Huel Cote, MD;  Location: [facility] ORS;  Service: Orthopedics;  Laterality: Left;   ROTATOR CUFF REPAIR Right    2018   Patient Active Problem List   Diagnosis Date Noted   Bimalleolar ankle fracture, left, closed, initial encounter 06/08/2023   Onychomycosis 10/07/2022   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), wet (HCC) 05/24/2022   Decreased grip strength of right hand 09/16/2021   Gait disturbance 07/12/2021   Mild cognitive impairment 07/12/2021   Thyroid goiter 12/10/2020   Mixed hyperlipidemia 12/10/2020   Degenerative cervical spinal stenosis, mild C4-C5 and C5-C6 12/10/2020   Lumbar degenerative disc disease, L4-L5 with left lateral recess  stenosis 12/10/2020   Personal history of fall 09/01/2020   Congenital cavus deformity of both feet 03/02/2020   Metatarsalgia of both feet 03/02/2020   Hand arthritis 03/02/2020   Arthritis of carpometacarpal (CMC) joint of both thumbs 09/19/2019   Overactive bladder 09/19/2019   Sciatica 04/26/2019   Paresthesia of both feet 02/04/2019   Epidermal cyst 04/17/2018   Allergic rhinitis 06/23/2015   Eczema 06/23/2015   Gastro-esophageal reflux disease without esophagitis 06/23/2015   Essential hypertension 06/23/2015   Hypothyroidism 06/23/2015     REFERRING PROVIDER: Benancio Deeds, MD   REFERRING DIAG:  [id number] (ICD-10-CM) - Bimalleolar ankle fracture, left, closed, initial encounter     S/p ORIF L ankle bimalleolar fx  Rationale for Evaluation and Treatment: Rehabilitation  THERAPY DIAG:  Pain in left ankle and joints of left foot  Stiffness of left ankle, not elsewhere classified  Difficulty in walking, not elsewhere classified  Localized edema  ONSET DATE: DOS 06/08/23  SUBJECTIVE:  SUBJECTIVE STATEMENT:   Doing a lot better, its still sore but a lot of things are easier. I'm not walking quite normally yet but its better  PERTINENT HISTORY:  History of mild cognitive impairment, paresthesia of both feet and sciatica in history.  Patient reports memory limitations  PAIN:  Are you having pain? Yes: NPRS scale: 1/10 Pain location: L ankle and arches of feet, post-op site  Pain description: "just sore" sometimes little nerve pains that can be sharp   Aggravating factors: unsure  Relieving factors: taking a break from doing too much     PRECAUTIONS:  None-ok to wean out of cam per MD note 07/19/23  RED FLAGS: None   WEIGHT BEARING RESTRICTIONS:  Yes WBAT with  boot  FALLS:  Has patient fallen in last 6 months? Yes. Number of falls 1-this was the cause of the injury  LIVING ENVIRONMENT: Lives with: lives with their family and lives with their spouse   OCCUPATION:  Retired  PLOF:  Independent  PATIENT GOALS:  Return to walking, gardening, and other prior level of function  OBJECTIVE:  Note: Objective measures were completed at Evaluation unless otherwise noted.  ROM  AROM  Left 08/28/23  Ankle DF  -10*  Ankle PF 43*  Ankle inversion  12*  Ankle eversion 9*      MMT  Left  08/28/23  Ankle DF 5  Ankle PF  1  Ankle inversion  3  Ankle eversion  3     FUNCTIONAL TESTS 08/28/23  SLS 3 seconds L, 10 seconds R TUG 9.5 seconds no device    TREATMENT:     08/28/23  Nustep L5 x8 minutes for w/u and tissue perfusion prior to testing Tandem stance on blue foam 3x30 seconds B  FOTO, objectives as above, goal check, education on progress thus far and potential benefit of continuing with PT but likely DC to advanced HEP at end of new cert period           08/24/23 Bike L3 x74mins  Step up on 6" step from airex forward and lateral Calf stretch 15 x4 (2x on lower level and then 2x on higher slant)  Resisted gait 20# 4 way x4  Calf raises 2x15 Toe raises 2x15 Forward lunges on to step Tandem stance on beam SLS on beam Small jumps on trampoline  STS on airex 2x5   08/21/23 Bike L 2.3 x 7 min Slant Board Calf stretch 4x15'' 20lb resisted gait 4 way x 3 each Heel raises 2x15  6in lateral step ups x10 each  L ankle PROM with end range holds, L ankle Jt mobs. 4 way ankle green 2x10 4in step downs Tmill pushes x20  08/17/23  Scifit bike L4x8 minutes for w/u and tissue perfusion Rocker board AP rocks x2 minutes for ROM Inversion/eversion on blue air pad x20 Ankle PF/DF x20 on blue air pad x20 Forward lunges onto soft surface of bosu x12 B SLS with one foot on soft surface of bosu 3x30 seconds B   Gastroc  stetches 3x30 seconds surgical LE Plantar fascia stretches 3x30 seconds B  Ankle 4 way strengthening black band x12 each    08/14/23 Bike L4 x81mins  Calf stretches on slant 15s x3 Rocker board DF/PF x20 Seated ankle TB green 4 way x4  Standing on airex SLS 10s with UE support  Joint mobs in L foot and ankle, passive, gentle stretch into DF Step ups 4"  Walking on beam   08/10/23  Nustep L5x8 minutes all four extremities a little closer today for dorsiflexion stretch on surgical ankle Forward lunges on 12 inch step with OP from PT anterior side of joint for ankle DF ROM x10 (to tolerance) Rocker board AP x15 for PF/DF ROM assist from RLE PRN Seated BAPS CCW and CW x10 each way L LE    Seated ankle alphabet x3 Heel raises double leg x15  Toe raises double leg x15   Scar massage/mobility Desensitization techniques for surgical ankle as tolerated Retrograde massage for edema     08/07/23  Nustep L5 x8 minutes all four extremities Standing ankle PF/DF x15 on BAPs with other leg assisting Tandem stance blue foam pad 2x30 seconds B Tandem gait in // bars 3 laps on foam    Seated ankle circles on BAPs x10 CW/CCW Seated ankle PF into blue TB 12x2 second holds cues for eccentric control  Seated ankle DF into blue TB 12x2 second holds cues for eccentric control  Heel raises in // bars x10 (double leg) Toe raises in // bars x10 (double leg) Double heel up, single (L) down x10 BUE support on bars Seated ankle alphabets x2   Education on desensitization of post-op ankle- use materials of different textures and alternate rubbing each one over sore spots when watching TV at night, continued to encourage elevation/ice        PATIENT EDUCATION:  Education details:  Modified HEP Person educated: Patient and Spouse Education method: Explanation, Facilities manager, Verbal cues Education comprehension:  verbalized understanding, returned demonstration, verbal cues required, and  needs further education  HOME EXERCISE PROGRAM: [id number]  ASSESSMENT:  CLINICAL IMPRESSION:  Focus on clinical progress note and re-assessment prior to upcoming MD visit. Unfortunately objective tables were not completed at eval so we were unable to make direct comparisons of her progress. Still has a lot of ankle stiffness, localized edema, and weakness around her ankle. Might benefit from a brief extension of PT especially to work on balance. Extended cert dates as appropriate.      GOALS: Goals reviewed with patient? Yes  SHORT TERM GOALS: Target date: 09/11/2023     Ankle active dorsiflexion to 5 degrees Baseline: Shy of 0 at evaluation Goal status: 08/28/23-  ONGOING, -10* today   2.  Able to stand with full weightbearing through foot pain less than or equal to 3 out of 10 Baseline: Unable to evaluation Goal status: 08/28/23 MET     LONG TERM GOALS: Target date: 09/25/2023      Meet Foto goal Baseline: See objective Goal status: 08/28/23- 44 (predicted 61)  2.  Able to demonstrate proper ambulation pattern without pain greater than 3 out of 10 for household distances Baseline:  Goal status: 08/28/23- PARTIALLY MET gait mechanics still not 100%   3.  Ankle dorsiflexion to 10 degrees Baseline:  Goal status: 08/28/23 ONGOING   4.  Able to navigate stairs ascending and descending without compensation Baseline:  Goal status: 08/28/23- ONGOING one step at a time   PLAN:  PT FREQUENCY: 1-2x/week  PT DURATION: 5 weeks (backdated for continous cert coverage)  PLANNED INTERVENTIONS: 29518- PT Re-evaluation, 97110-Therapeutic exercises, 97530- Therapeutic activity, 97112- Neuromuscular re-education, 97535- Self Care, 84166- Manual therapy, [id number]- Gait training, [phone]- Aquatic Therapy, 97014- Electrical stimulation (unattended), 97016- Vasopneumatic device, Patient/Family education, Balance training, Stair training, Taping, Dry Needling, Joint mobilization, Spinal  mobilization, Scar mobilization, Cryotherapy, and Moist heat.  PLAN FOR NEXT SESSION:  Per MD note 07/19/23, ok to wean out of CAM  as tolerated. Continue work on strength, balance, ankle mm endurance. Manual techniques PRN.   Next MD visit 08/31/23   Nedra Hai, PT, DPT 08/28/23 4:01 PM

## 2023-08-30 DIAGNOSIS — L817 Pigmented purpuric dermatosis: Secondary | ICD-10-CM | POA: Diagnosis not present

## 2023-08-30 DIAGNOSIS — L820 Inflamed seborrheic keratosis: Secondary | ICD-10-CM | POA: Diagnosis not present

## 2023-08-30 DIAGNOSIS — D485 Neoplasm of uncertain behavior of skin: Secondary | ICD-10-CM | POA: Diagnosis not present

## 2023-08-30 DIAGNOSIS — L218 Other seborrheic dermatitis: Secondary | ICD-10-CM | POA: Diagnosis not present

## 2023-08-30 DIAGNOSIS — L853 Xerosis cutis: Secondary | ICD-10-CM | POA: Diagnosis not present

## 2023-08-30 DIAGNOSIS — Z808 Family history of malignant neoplasm of other organs or systems: Secondary | ICD-10-CM | POA: Diagnosis not present

## 2023-08-31 ENCOUNTER — Encounter: Payer: Self-pay | Admitting: Physical Therapy

## 2023-08-31 ENCOUNTER — Ambulatory Visit (HOSPITAL_BASED_OUTPATIENT_CLINIC_OR_DEPARTMENT_OTHER): Payer: Medicare Other

## 2023-08-31 ENCOUNTER — Ambulatory Visit: Payer: Medicare Other | Admitting: Physical Therapy

## 2023-08-31 ENCOUNTER — Ambulatory Visit (INDEPENDENT_AMBULATORY_CARE_PROVIDER_SITE_OTHER): Payer: Medicare Other | Admitting: Orthopaedic Surgery

## 2023-08-31 DIAGNOSIS — M25572 Pain in left ankle and joints of left foot: Secondary | ICD-10-CM

## 2023-08-31 DIAGNOSIS — S82402D Unspecified fracture of shaft of left fibula, subsequent encounter for closed fracture with routine healing: Secondary | ICD-10-CM | POA: Diagnosis not present

## 2023-08-31 DIAGNOSIS — S8252XD Displaced fracture of medial malleolus of left tibia, subsequent encounter for closed fracture with routine healing: Secondary | ICD-10-CM | POA: Diagnosis not present

## 2023-08-31 DIAGNOSIS — R262 Difficulty in walking, not elsewhere classified: Secondary | ICD-10-CM | POA: Diagnosis not present

## 2023-08-31 DIAGNOSIS — R600 Localized edema: Secondary | ICD-10-CM

## 2023-08-31 DIAGNOSIS — S82842A Displaced bimalleolar fracture of left lower leg, initial encounter for closed fracture: Secondary | ICD-10-CM

## 2023-08-31 DIAGNOSIS — M25672 Stiffness of left ankle, not elsewhere classified: Secondary | ICD-10-CM | POA: Diagnosis not present

## 2023-08-31 NOTE — Therapy (Signed)
 OUTPATIENT PHYSICAL THERAPY TREATMENT      Patient Name: Shelly Pearson MRN: [id number] DOB:12-07-1940, 83 y.o., female Today's Date: 08/31/2023      END OF SESSION:  PT End of Session - 08/31/23 1556     Visit Number 18    Date for PT Re-Evaluation 09/25/23    Authorization Type MCR    Authorization Time Period extended 08/28/23    PT Start Time 1517    PT Stop Time 1555    PT Time Calculation (min) 38 min    Activity Tolerance Patient tolerated treatment well    Behavior During Therapy [facility] for tasks assessed/performed                   Past Medical History:  Diagnosis Date   B12 deficiency 03/02/2020   GERD (gastroesophageal reflux disease)    Hypertension    Seasonal allergies    Thyroid disease    Urge incontinence of urine 12/10/2020   Urgency incontinence    Past Surgical History:  Procedure Laterality Date   ABDOMINAL HYSTERECTOMY     CESAREAN SECTION     CYSTOCELE REPAIR     ORIF ANKLE FRACTURE Left 06/08/2023   Procedure: LEFT OPEN REDUCTION INTERNAL FIXATION (ORIF) ANKLE FRACTURE;  Surgeon: Huel Cote, MD;  Location: [facility] ORS;  Service: Orthopedics;  Laterality: Left;   ROTATOR CUFF REPAIR Right    2018   Patient Active Problem List   Diagnosis Date Noted   Bimalleolar ankle fracture, left, closed, initial encounter 06/08/2023   Onychomycosis 10/07/2022   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), wet (HCC) 05/24/2022   Decreased grip strength of right hand 09/16/2021   Gait disturbance 07/12/2021   Mild cognitive impairment 07/12/2021   Thyroid goiter 12/10/2020   Mixed hyperlipidemia 12/10/2020   Degenerative cervical spinal stenosis, mild C4-C5 and C5-C6 12/10/2020   Lumbar degenerative disc disease, L4-L5 with left lateral recess stenosis 12/10/2020   Personal history of fall 09/01/2020   Congenital cavus deformity of both feet 03/02/2020   Metatarsalgia of both feet 03/02/2020   Hand arthritis 03/02/2020   Arthritis of  carpometacarpal (CMC) joint of both thumbs 09/19/2019   Overactive bladder 09/19/2019   Sciatica 04/26/2019   Paresthesia of both feet 02/04/2019   Epidermal cyst 04/17/2018   Allergic rhinitis 06/23/2015   Eczema 06/23/2015   Gastro-esophageal reflux disease without esophagitis 06/23/2015   Essential hypertension 06/23/2015   Hypothyroidism 06/23/2015     REFERRING PROVIDER: Benancio Deeds, MD   REFERRING DIAG:  [phone] (ICD-10-CM) - Bimalleolar ankle fracture, left, closed, initial encounter     S/p ORIF L ankle bimalleolar fx  Rationale for Evaluation and Treatment: Rehabilitation  THERAPY DIAG:  Pain in left ankle and joints of left foot  Stiffness of left ankle, not elsewhere classified  Difficulty in walking, not elsewhere classified  Localized edema  ONSET DATE: DOS 06/08/23  SUBJECTIVE:  SUBJECTIVE STATEMENT:   Saw the doctor, he told me that the ankle is healing OK but it will just take awhile. Wants me to wear the compression stockings a bit more, I had stopped doing this. He did say that everything is healing like it should, it just needs time.   PERTINENT HISTORY:  History of mild cognitive impairment, paresthesia of both feet and sciatica in history.  Patient reports memory limitations  PAIN:  Are you having pain? No 0/10   PRECAUTIONS:  None-ok to wean out of cam per MD note 07/19/23  RED FLAGS: None   WEIGHT BEARING RESTRICTIONS:  Yes WBAT with boot  FALLS:  Has patient fallen in last 6 months? Yes. Number of falls 1-this was the cause of the injury  LIVING ENVIRONMENT: Lives with: lives with their family and lives with their spouse   OCCUPATION:  Retired  PLOF:  Independent  PATIENT GOALS:  Return to walking, gardening, and other prior level of  function  OBJECTIVE:  Note: Objective measures were completed at Evaluation unless otherwise noted.  ROM  AROM  Left 08/28/23  Ankle DF  -10*  Ankle PF 43*  Ankle inversion  12*  Ankle eversion 9*      MMT  Left  08/28/23  Ankle DF 5  Ankle PF  1  Ankle inversion  3  Ankle eversion  3     FUNCTIONAL TESTS 08/28/23  SLS 3 seconds L, 10 seconds R TUG 9.5 seconds no device    TREATMENT:     08/31/23  Nustep L5 x8 minutes all four extremities for w/u, tissue perfusion prior to exercise  Gastroc stretches on black bar 2x30 seconds  Slant board gastroc stretch 2x30 seconds  Plantar fascia stretch on 2x30 seconds off step  Heel raises on slant board x10 Lunges on 12 inch box with OP into ankle DF surgical LE x10    Double stair taps x10 B standing on blue foam pad  3 way taps on blue foam pad- tap to step, lateral, retro x10 B  Side steps on blue foam with target taps x4 rounds   Side steps stepping over targets x4 rounds blue foam  Forward walking over blue foam stepping over targets x4 laps           08/28/23  Nustep L5 x8 minutes for w/u and tissue perfusion prior to testing Tandem stance on blue foam 3x30 seconds B  FOTO, objectives as above, goal check, education on progress thus far and potential benefit of continuing with PT but likely DC to advanced HEP at end of new cert period           08/24/23 Bike L3 x29mins  Step up on 6" step from airex forward and lateral Calf stretch 15 x4 (2x on lower level and then 2x on higher slant)  Resisted gait 20# 4 way x4  Calf raises 2x15 Toe raises 2x15 Forward lunges on to step Tandem stance on beam SLS on beam Small jumps on trampoline  STS on airex 2x5   08/21/23 Bike L 2.3 x 7 min Slant Board Calf stretch 4x15'' 20lb resisted gait 4 way x 3 each Heel raises 2x15  6in lateral step ups x10 each  L ankle PROM with end range holds, L ankle Jt mobs. 4 way ankle green 2x10 4in step  downs Tmill pushes x20  08/17/23  Scifit bike L4x8 minutes for w/u and tissue perfusion Rocker board AP rocks x2 minutes for ROM Inversion/eversion  on blue air pad x20 Ankle PF/DF x20 on blue air pad x20 Forward lunges onto soft surface of bosu x12 B SLS with one foot on soft surface of bosu 3x30 seconds B   Gastroc stetches 3x30 seconds surgical LE Plantar fascia stretches 3x30 seconds B  Ankle 4 way strengthening black band x12 each    08/14/23 Bike L4 x9mins  Calf stretches on slant 15s x3 Rocker board DF/PF x20 Seated ankle TB green 4 way x4  Standing on airex SLS 10s with UE support  Joint mobs in L foot and ankle, passive, gentle stretch into DF Step ups 4"  Walking on beam   08/10/23  Nustep L5x8 minutes all four extremities a little closer today for dorsiflexion stretch on surgical ankle Forward lunges on 12 inch step with OP from PT anterior side of joint for ankle DF ROM x10 (to tolerance) Rocker board AP x15 for PF/DF ROM assist from RLE PRN Seated BAPS CCW and CW x10 each way L LE    Seated ankle alphabet x3 Heel raises double leg x15  Toe raises double leg x15   Scar massage/mobility Desensitization techniques for surgical ankle as tolerated Retrograde massage for edema     08/07/23  Nustep L5 x8 minutes all four extremities Standing ankle PF/DF x15 on BAPs with other leg assisting Tandem stance blue foam pad 2x30 seconds B Tandem gait in // bars 3 laps on foam    Seated ankle circles on BAPs x10 CW/CCW Seated ankle PF into blue TB 12x2 second holds cues for eccentric control  Seated ankle DF into blue TB 12x2 second holds cues for eccentric control  Heel raises in // bars x10 (double leg) Toe raises in // bars x10 (double leg) Double heel up, single (L) down x10 BUE support on bars Seated ankle alphabets x2   Education on desensitization of post-op ankle- use materials of different textures and alternate rubbing each one over sore spots when  watching TV at night, continued to encourage elevation/ice        PATIENT EDUCATION:  Education details:  Modified HEP Person educated: Patient and Spouse Education method: Programmer, multimedia, Facilities manager, Verbal cues Education comprehension:  verbalized understanding, returned demonstration, verbal cues required, and needs further education  HOME EXERCISE PROGRAM: [id number]  ASSESSMENT:  CLINICAL IMPRESSION:  Pt arrives today with a good report from the surgeon, who would like her to continue with PT for now. We continued working on a bit of ROM as well as balance and strength progressions as tolerated this session.      GOALS: Goals reviewed with patient? Yes  SHORT TERM GOALS: Target date: 09/11/2023     Ankle active dorsiflexion to 5 degrees Baseline: Shy of 0 at evaluation Goal status: 08/28/23-  ONGOING, -10* today   2.  Able to stand with full weightbearing through foot pain less than or equal to 3 out of 10 Baseline: Unable to evaluation Goal status: 08/28/23 MET     LONG TERM GOALS: Target date: 09/25/2023      Meet Foto goal Baseline: See objective Goal status: 08/28/23- 44 (predicted 61)  2.  Able to demonstrate proper ambulation pattern without pain greater than 3 out of 10 for household distances Baseline:  Goal status: 08/28/23- PARTIALLY MET gait mechanics still not 100%   3.  Ankle dorsiflexion to 10 degrees Baseline:  Goal status: 08/28/23 ONGOING   4.  Able to navigate stairs ascending and descending without compensation Baseline:  Goal status: 08/28/23- ONGOING  one step at a time   PLAN:  PT FREQUENCY: 1-2x/week  PT DURATION: 5 weeks (backdated for continous cert coverage)  PLANNED INTERVENTIONS: 13086- PT Re-evaluation, 97110-Therapeutic exercises, 97530- Therapeutic activity, 97112- Neuromuscular re-education, 97535- Self Care, 57846- Manual therapy, [phone]- Gait training, [phone]- Aquatic Therapy, 97014- Electrical stimulation  (unattended), 97016- Vasopneumatic device, Patient/Family education, Balance training, Stair training, Taping, Dry Needling, Joint mobilization, Spinal mobilization, Scar mobilization, Cryotherapy, and Moist heat.  PLAN FOR NEXT SESSION:  Per MD note 07/19/23, ok to wean out of CAM as tolerated. Continue work on strength, balance, ankle mm endurance. Manual techniques PRN/as desired   Next MD visit 11/24/23   Nedra Hai, PT, DPT 08/31/23 3:56 PM

## 2023-08-31 NOTE — Progress Notes (Signed)
 Post Operative Evaluation    Procedure/Date of Surgery: Left ankle open reduction internal fixation 12/5  Interval History:    Presents today 12 weeks status post the above surgery.  She has now weaned completely out of her boot.  She is using her husband's shoes that she is still experiencing some swelling in the ankle.  She is still continuing to ice and elevate.  Overall she is slowly improving   PMH/PSH/Family History/Social History/Meds/Allergies:    Past Medical History:  Diagnosis Date   B12 deficiency 03/02/2020   GERD (gastroesophageal reflux disease)    Hypertension    Seasonal allergies    Thyroid disease    Urge incontinence of urine 12/10/2020   Urgency incontinence    Past Surgical History:  Procedure Laterality Date   ABDOMINAL HYSTERECTOMY     CESAREAN SECTION     CYSTOCELE REPAIR     ORIF ANKLE FRACTURE Left 06/08/2023   Procedure: LEFT OPEN REDUCTION INTERNAL FIXATION (ORIF) ANKLE FRACTURE;  Surgeon: Huel Cote, MD;  Location: [facility] ORS;  Service: Orthopedics;  Laterality: Left;   ROTATOR CUFF REPAIR Right    2018   Social History   Socioeconomic History   Marital status: Married    Spouse name: Not on file   Number of children: Not on file   Years of education: Not on file   Highest education level: Bachelor's degree (e.g., BA, AB, BS)  Occupational History   Occupation: Retired  Tobacco Use   Smoking status: Never   Smokeless tobacco: Never  Vaping Use   Vaping status: Never Used  Substance and Sexual Activity   Alcohol use: Yes    Comment: very rare    Drug use: No   Sexual activity: Not Currently  Other Topics Concern   Not on file  Social History Narrative   Lives with husband   Right handed   1 cup tea every morning   Social Drivers of Health   Financial Resource Strain: Low Risk  (10/06/2022)   Overall Financial Resource Strain (CARDIA)    Difficulty of Paying Living Expenses: Not hard at all   Food Insecurity: No Food Insecurity (10/06/2022)   Hunger Vital Sign    Worried About Running Out of Food in the Last Year: Never true    Ran Out of Food in the Last Year: Never true  Transportation Needs: No Transportation Needs (10/06/2022)   PRAPARE - Administrator, Civil Service (Medical): No    Lack of Transportation (Non-Medical): No  Physical Activity: Insufficiently Active (10/06/2022)   Exercise Vital Sign    Days of Exercise per Week: 3 days    Minutes of Exercise per Session: 40 min  Stress: Stress Concern Present (10/06/2022)   [employer] of Occupational Health - Occupational Stress Questionnaire    Feeling of Stress : To some extent  Social Connections: Socially Integrated (10/06/2022)   Social Connection and Isolation Panel [NHANES]    Frequency of Communication with Friends and Family: Three times a week    Frequency of Social Gatherings with Friends and Family: Once a week    Attends Religious Services: More than 4 times per year    Active Member of [employer] or Organizations: Yes    Attends Banker Meetings: More than 4 times per year  Marital Status: Married   Family History  Problem Relation Age of Onset   Stroke Mother    Cancer Father    Cancer Sister    Cancer Brother    Allergies  Allergen Reactions   Adhesive  [Tape] Rash   Current Outpatient Medications  Medication Sig Dispense Refill   aspirin EC 325 MG tablet Take 1 tablet (325 mg total) by mouth daily. 14 tablet 0   atorvastatin (LIPITOR) 20 MG tablet Take 1 tablet (20 mg total) by mouth daily. 90 tablet 3   Biotin 10 MG CAPS Take 10 mg by mouth daily.     calcium carbonate (TUMS - DOSED IN MG ELEMENTAL CALCIUM) 500 MG chewable tablet Chew 1 tablet by mouth daily as needed for indigestion or heartburn.     ibuprofen (ADVIL) 200 MG tablet Take 600 mg by mouth every 8 (eight) hours as needed for moderate pain (pain score 4-6).     levothyroxine (SYNTHROID) 50 MCG tablet Take 1  tablet (50 mcg total) by mouth daily before breakfast. 90 tablet 3   losartan (COZAAR) 25 MG tablet Take 1 tablet (25 mg total) by mouth daily. 90 tablet 3   Multiple Vitamins-Minerals (MATURE ADULT CENTURY PO) Take 1 tablet by mouth daily.     Polyethyl Glycol-Propyl Glycol (SYSTANE OP) Place 2 drops into both eyes 3 (three) times daily.     trospium (SANCTURA) 20 MG tablet Take 1 tablet (20 mg total) by mouth at bedtime. 90 tablet 3   No current facility-administered medications for this visit.   No results found.  Review of Systems:   A ROS was performed including pertinent positives and negatives as documented in the HPI.   Musculoskeletal Exam:    There were no vitals taken for this visit.  Left ankle incisions are well-appearing without erythema or drainage.  There is still trace swelling about the ankle.  Range of motion is 10 degrees dorsi and plantarflexion.  Neurosensory exam is intact  Imaging:    3 views left ankle: Status post open reduction internal fixation overall doing well without complication, there is interval healing of her fracture  I personally reviewed and interpreted the radiographs.   Assessment:   12 weeks status post left ankle open reduction internal fixation without evidence of complication.  She is making improvements at today's visit.  I did discuss that swelling is quite routine for ankle fractures.  Have continued to advise her to ice and elevate.  She will continue to remain active.  I will plan to see her back in 12 weeks for likely final check  Plan :    -Return to clinic 12 weeks for reassessment      I personally saw and evaluated the patient, and participated in the management and treatment plan.  Huel Cote, MD Attending Physician, Orthopedic Surgery  This document was dictated using Dragon voice recognition software. A reasonable attempt at proof reading has been made to minimize errors.

## 2023-09-04 ENCOUNTER — Ambulatory Visit: Payer: Medicare Other | Attending: Orthopaedic Surgery | Admitting: Physical Therapy

## 2023-09-04 ENCOUNTER — Encounter: Payer: Self-pay | Admitting: Physical Therapy

## 2023-09-04 DIAGNOSIS — M25672 Stiffness of left ankle, not elsewhere classified: Secondary | ICD-10-CM | POA: Insufficient documentation

## 2023-09-04 DIAGNOSIS — R262 Difficulty in walking, not elsewhere classified: Secondary | ICD-10-CM | POA: Insufficient documentation

## 2023-09-04 DIAGNOSIS — M25572 Pain in left ankle and joints of left foot: Secondary | ICD-10-CM | POA: Diagnosis not present

## 2023-09-04 DIAGNOSIS — R600 Localized edema: Secondary | ICD-10-CM | POA: Diagnosis not present

## 2023-09-04 NOTE — Therapy (Signed)
 OUTPATIENT PHYSICAL THERAPY TREATMENT      Patient Name: Shelly Pearson MRN: [id number] DOB:04/25/1941, 83 y.o., female Today's Date: 09/04/2023      END OF SESSION:  PT End of Session - 09/04/23 0844     Visit Number 19    Date for PT Re-Evaluation 09/25/23    PT Start Time 0845    PT Stop Time 0930    PT Time Calculation (min) 45 min    Activity Tolerance Patient tolerated treatment well    Behavior During Therapy [facility] for tasks assessed/performed                   Past Medical History:  Diagnosis Date   B12 deficiency 03/02/2020   GERD (gastroesophageal reflux disease)    Hypertension    Seasonal allergies    Thyroid disease    Urge incontinence of urine 12/10/2020   Urgency incontinence    Past Surgical History:  Procedure Laterality Date   ABDOMINAL HYSTERECTOMY     CESAREAN SECTION     CYSTOCELE REPAIR     ORIF ANKLE FRACTURE Left 06/08/2023   Procedure: LEFT OPEN REDUCTION INTERNAL FIXATION (ORIF) ANKLE FRACTURE;  Surgeon: Huel Cote, MD;  Location: [facility] ORS;  Service: Orthopedics;  Laterality: Left;   ROTATOR CUFF REPAIR Right    2018   Patient Active Problem List   Diagnosis Date Noted   Bimalleolar ankle fracture, left, closed, initial encounter 06/08/2023   Onychomycosis 10/07/2022   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), wet (HCC) 05/24/2022   Decreased grip strength of right hand 09/16/2021   Gait disturbance 07/12/2021   Mild cognitive impairment 07/12/2021   Thyroid goiter 12/10/2020   Mixed hyperlipidemia 12/10/2020   Degenerative cervical spinal stenosis, mild C4-C5 and C5-C6 12/10/2020   Lumbar degenerative disc disease, L4-L5 with left lateral recess stenosis 12/10/2020   Personal history of fall 09/01/2020   Congenital cavus deformity of both feet 03/02/2020   Metatarsalgia of both feet 03/02/2020   Hand arthritis 03/02/2020   Arthritis of carpometacarpal (CMC) joint of both thumbs 09/19/2019   Overactive bladder  09/19/2019   Sciatica 04/26/2019   Paresthesia of both feet 02/04/2019   Epidermal cyst 04/17/2018   Allergic rhinitis 06/23/2015   Eczema 06/23/2015   Gastro-esophageal reflux disease without esophagitis 06/23/2015   Essential hypertension 06/23/2015   Hypothyroidism 06/23/2015     REFERRING PROVIDER: Benancio Deeds, MD   REFERRING DIAG:  [phone] (ICD-10-CM) - Bimalleolar ankle fracture, left, closed, initial encounter     S/p ORIF L ankle bimalleolar fx  Rationale for Evaluation and Treatment: Rehabilitation  THERAPY DIAG:  Pain in left ankle and joints of left foot  Stiffness of left ankle, not elsewhere classified  Localized edema  Difficulty in walking, not elsewhere classified  ONSET DATE: DOS 06/08/23  SUBJECTIVE:  SUBJECTIVE STATEMENT:  Compression socks helps a lot, ankle is feeling ok right now. Elevating ankle at the end of the day   PERTINENT HISTORY:  History of mild cognitive impairment, paresthesia of both feet and sciatica in history.  Patient reports memory limitations  PAIN:  Are you having pain? No 0/10   PRECAUTIONS:  None-ok to wean out of cam per MD note 07/19/23  RED FLAGS: None   WEIGHT BEARING RESTRICTIONS:  Yes WBAT with boot  FALLS:  Has patient fallen in last 6 months? Yes. Number of falls 1-this was the cause of the injury  LIVING ENVIRONMENT: Lives with: lives with their family and lives with their spouse   OCCUPATION:  Retired  PLOF:  Independent  PATIENT GOALS:  Return to walking, gardening, and other prior level of function  OBJECTIVE:  Note: Objective measures were completed at Evaluation unless otherwise noted.  ROM  AROM  Left 08/28/23  Ankle DF  -10*  Ankle PF 43*  Ankle inversion  12*  Ankle eversion 9*      MMT   Left  08/28/23  Ankle DF 5  Ankle PF  1  Ankle inversion  3  Ankle eversion  3     FUNCTIONAL TESTS 08/28/23  SLS 3 seconds L, 10 seconds R TUG 9.5 seconds no device    TREATMENT:    09/04/23 L ankle PROM w/ end range holds  L anke Jt Mobs L ankel Green Tband 4 way x 15 each NuStep L4 x 6 min LE only Tmill pushes x20, 2x10 Slant board calf stretch 6in lateral step ups  20lb resistd side step over WaTE bar x5 each 4in step downs LLE stretch 2x10 HS curls 25lb 2x10 Leg Ext 5lb 2x10   08/31/23  Nustep L5 x8 minutes all four extremities for w/u, tissue perfusion prior to exercise  Gastroc stretches on black bar 2x30 seconds  Slant board gastroc stretch 2x30 seconds  Plantar fascia stretch on 2x30 seconds off step  Heel raises on slant board x10 Lunges on 12 inch box with OP into ankle DF surgical LE x10    Double stair taps x10 B standing on blue foam pad  3 way taps on blue foam pad- tap to step, lateral, retro x10 B  Side steps on blue foam with target taps x4 rounds   Side steps stepping over targets x4 rounds blue foam  Forward walking over blue foam stepping over targets x4 laps           08/28/23  Nustep L5 x8 minutes for w/u and tissue perfusion prior to testing Tandem stance on blue foam 3x30 seconds B  FOTO, objectives as above, goal check, education on progress thus far and potential benefit of continuing with PT but likely DC to advanced HEP at end of new cert period      08/24/23 Bike L3 x75mins  Step up on 6" step from airex forward and lateral Calf stretch 15 x4 (2x on lower level and then 2x on higher slant)  Resisted gait 20# 4 way x4  Calf raises 2x15 Toe raises 2x15 Forward lunges on to step Tandem stance on beam SLS on beam Small jumps on trampoline  STS on airex 2x5   08/21/23 Bike L 2.3 x 7 min Slant Board Calf stretch 4x15'' 20lb resisted gait 4 way x 3 each Heel raises 2x15  6in lateral step ups x10 each  L ankle PROM  with end range holds, L ankle Jt mobs.  4 way ankle green 2x10 4in step downs Tmill pushes x20  08/17/23  Scifit bike L4x8 minutes for w/u and tissue perfusion Rocker board AP rocks x2 minutes for ROM Inversion/eversion on blue air pad x20 Ankle PF/DF x20 on blue air pad x20 Forward lunges onto soft surface of bosu x12 B SLS with one foot on soft surface of bosu 3x30 seconds B   Gastroc stetches 3x30 seconds surgical LE Plantar fascia stretches 3x30 seconds B  Ankle 4 way strengthening black band x12 each    08/14/23 Bike L4 x28mins  Calf stretches on slant 15s x3 Rocker board DF/PF x20 Seated ankle TB green 4 way x4  Standing on airex SLS 10s with UE support  Joint mobs in L foot and ankle, passive, gentle stretch into DF Step ups 4"  Walking on beam   08/10/23  Nustep L5x8 minutes all four extremities a little closer today for dorsiflexion stretch on surgical ankle Forward lunges on 12 inch step with OP from PT anterior side of joint for ankle DF ROM x10 (to tolerance) Rocker board AP x15 for PF/DF ROM assist from RLE PRN Seated BAPS CCW and CW x10 each way L LE    Seated ankle alphabet x3 Heel raises double leg x15  Toe raises double leg x15   Scar massage/mobility Desensitization techniques for surgical ankle as tolerated Retrograde massage for edema     08/07/23  Nustep L5 x8 minutes all four extremities Standing ankle PF/DF x15 on BAPs with other leg assisting Tandem stance blue foam pad 2x30 seconds B Tandem gait in // bars 3 laps on foam    Seated ankle circles on BAPs x10 CW/CCW Seated ankle PF into blue TB 12x2 second holds cues for eccentric control  Seated ankle DF into blue TB 12x2 second holds cues for eccentric control  Heel raises in // bars x10 (double leg) Toe raises in // bars x10 (double leg) Double heel up, single (L) down x10 BUE support on bars Seated ankle alphabets x2   Education on desensitization of post-op ankle- use materials  of different textures and alternate rubbing each one over sore spots when watching TV at night, continued to encourage elevation/ice        PATIENT EDUCATION:  Education details:  Modified HEP Person educated: Patient and Spouse Education method: Explanation, Facilities manager, Verbal cues Education comprehension:  verbalized understanding, returned demonstration, verbal cues required, and needs further education  HOME EXERCISE PROGRAM: [email or web address]  ASSESSMENT:  CLINICAL IMPRESSION:  Pt arrives today feeling well reporting that the compressing sock is working. Continued working on a bit of ROM, balance, and strength. All interventions completed well, pt reprots that's eh could feel a pull with tmill pushed and step downs. Cues for eccentric control needed with resisted side steps, leg curls, and extensions.    GOALS: Goals reviewed with patient? Yes  SHORT TERM GOALS: Target date: 09/11/2023     Ankle active dorsiflexion to 5 degrees Baseline: Shy of 0 at evaluation Goal status: 08/28/23-  ONGOING, -10* today   2.  Able to stand with full weightbearing through foot pain less than or equal to 3 out of 10 Baseline: Unable to evaluation Goal status: 08/28/23 MET     LONG TERM GOALS: Target date: 09/25/2023      Meet Foto goal Baseline: See objective Goal status: 08/28/23- 44 (predicted 61)  2.  Able to demonstrate proper ambulation pattern without pain greater than 3 out of 10 for household distances Baseline:  Goal status: 08/28/23- PARTIALLY MET gait mechanics still not 100%   3.  Ankle dorsiflexion to 10 degrees Baseline:  Goal status: 08/28/23 ONGOING   4.  Able to navigate stairs ascending and descending without compensation Baseline:  Goal status: 08/28/23- ONGOING one step at a time   PLAN:  PT FREQUENCY: 1-2x/week  PT DURATION: 5 weeks (backdated for continous cert coverage)  PLANNED INTERVENTIONS: 16109- PT Re-evaluation, 97110-Therapeutic exercises,  97530- Therapeutic activity, 97112- Neuromuscular re-education, 97535- Self Care, 60454- Manual therapy, [id number]- Gait training, [phone]- Aquatic Therapy, 97014- Electrical stimulation (unattended), 97016- Vasopneumatic device, Patient/Family education, Balance training, Stair training, Taping, Dry Needling, Joint mobilization, Spinal mobilization, Scar mobilization, Cryotherapy, and Moist heat.  PLAN FOR NEXT SESSION:  Continue work on strength, balance, ankle mm endurance. Manual techniques PRN/as desired   Next MD visit 11/24/23   Debroah Baller, PTA 09/04/23 8:44 AM

## 2023-09-07 ENCOUNTER — Encounter: Payer: Self-pay | Admitting: Physical Therapy

## 2023-09-07 ENCOUNTER — Ambulatory Visit: Payer: Medicare Other | Admitting: Physical Therapy

## 2023-09-07 DIAGNOSIS — R600 Localized edema: Secondary | ICD-10-CM

## 2023-09-07 DIAGNOSIS — M25572 Pain in left ankle and joints of left foot: Secondary | ICD-10-CM

## 2023-09-07 DIAGNOSIS — M25672 Stiffness of left ankle, not elsewhere classified: Secondary | ICD-10-CM | POA: Diagnosis not present

## 2023-09-07 DIAGNOSIS — R262 Difficulty in walking, not elsewhere classified: Secondary | ICD-10-CM | POA: Diagnosis not present

## 2023-09-07 NOTE — Therapy (Signed)
 OUTPATIENT PHYSICAL THERAPY TREATMENT  Progress Note Reporting Period 08/07/23 to 09/07/23 for visits 11-20  See note below for Objective Data and Assessment of Progress/Goals.        Patient Name: Shelly Pearson MRN: [id number] DOB:1941-01-03, 83 y.o., female Today's Date: 09/07/2023      END OF SESSION:  PT End of Session - 09/07/23 1012     Visit Number 20    Date for PT Re-Evaluation 09/25/23    PT Start Time 1015    PT Stop Time 1100    PT Time Calculation (min) 45 min    Activity Tolerance Patient tolerated treatment well    Behavior During Therapy [facility] for tasks assessed/performed                   Past Medical History:  Diagnosis Date   B12 deficiency 03/02/2020   GERD (gastroesophageal reflux disease)    Hypertension    Seasonal allergies    Thyroid disease    Urge incontinence of urine 12/10/2020   Urgency incontinence    Past Surgical History:  Procedure Laterality Date   ABDOMINAL HYSTERECTOMY     CESAREAN SECTION     CYSTOCELE REPAIR     ORIF ANKLE FRACTURE Left 06/08/2023   Procedure: LEFT OPEN REDUCTION INTERNAL FIXATION (ORIF) ANKLE FRACTURE;  Surgeon: Huel Cote, MD;  Location: [facility] ORS;  Service: Orthopedics;  Laterality: Left;   ROTATOR CUFF REPAIR Right    2018   Patient Active Problem List   Diagnosis Date Noted   Bimalleolar ankle fracture, left, closed, initial encounter 06/08/2023   Onychomycosis 10/07/2022   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), wet (HCC) 05/24/2022   Decreased grip strength of right hand 09/16/2021   Gait disturbance 07/12/2021   Mild cognitive impairment 07/12/2021   Thyroid goiter 12/10/2020   Mixed hyperlipidemia 12/10/2020   Degenerative cervical spinal stenosis, mild C4-C5 and C5-C6 12/10/2020   Lumbar degenerative disc disease, L4-L5 with left lateral recess stenosis 12/10/2020   Personal history of fall 09/01/2020   Congenital cavus deformity of both feet 03/02/2020   Metatarsalgia of both  feet 03/02/2020   Hand arthritis 03/02/2020   Arthritis of carpometacarpal (CMC) joint of both thumbs 09/19/2019   Overactive bladder 09/19/2019   Sciatica 04/26/2019   Paresthesia of both feet 02/04/2019   Epidermal cyst 04/17/2018   Allergic rhinitis 06/23/2015   Eczema 06/23/2015   Gastro-esophageal reflux disease without esophagitis 06/23/2015   Essential hypertension 06/23/2015   Hypothyroidism 06/23/2015     REFERRING PROVIDER: Benancio Deeds, MD   REFERRING DIAG:  [phone] (ICD-10-CM) - Bimalleolar ankle fracture, left, closed, initial encounter     S/p ORIF L ankle bimalleolar fx  Rationale for Evaluation and Treatment: Rehabilitation  THERAPY DIAG:  Pain in left ankle and joints of left foot  Stiffness of left ankle, not elsewhere classified  Difficulty in walking, not elsewhere classified  Localized edema  ONSET DATE: DOS 06/08/23  SUBJECTIVE:  SUBJECTIVE STATEMENT:  Was sore after last session   PERTINENT HISTORY:  History of mild cognitive impairment, paresthesia of both feet and sciatica in history.  Patient reports memory limitations  PAIN:  Are you having pain? No 1/10  discomfort in the arch of her foot and the heel area  PRECAUTIONS:  None-ok to wean out of cam per MD note 07/19/23  RED FLAGS: None   WEIGHT BEARING RESTRICTIONS:  Yes WBAT with boot  FALLS:  Has patient fallen in last 6 months? Yes. Number of falls 1-this was the cause of the injury  LIVING ENVIRONMENT: Lives with: lives with their family and lives with their spouse   OCCUPATION:  Retired  PLOF:  Independent  PATIENT GOALS:  Return to walking, gardening, and other prior level of function  OBJECTIVE:  Note: Objective measures were completed at Evaluation unless otherwise  noted.  ROM  AROM  Left 08/28/23 Left 09/07/23  Ankle DF  -10* 3  Ankle PF 43* 50  Ankle inversion  12* 21  Ankle eversion 9* 12      MMT  Left  08/28/23  Ankle DF 5  Ankle PF  1  Ankle inversion  3  Ankle eversion  3     FUNCTIONAL TESTS 08/28/23  SLS 3 seconds L, 10 seconds R TUG 9.5 seconds no device    TREATMENT:    09/07/23 L ankle PROM w/ end range holds  L anke Jt Mobs NuStep L4 x 6 min LE only Stair negotiation 4 & 6 in alt pattern 1 rail. Mods cues for sequencing 4in step downs Resisted gait 30lb 4 way x 3 each 8in lateral step ups form airex x10 each  Leg press 30lb 2x10, Heel Raises 30lb 2x12   09/04/23 L ankle PROM w/ end range holds  L anke Jt Mobs L ankel Green Tband 4 way x 15 each NuStep L4 x 6 min LE only Tmill pushes x20, 2x10 Slant board calf stretch 6in lateral step ups  20lb resistd side step over WaTE bar x5 each 4in step downs LLE stretch 2x10 HS curls 25lb 2x10 Leg Ext 5lb 2x10   08/31/23  Nustep L5 x8 minutes all four extremities for w/u, tissue perfusion prior to exercise  Gastroc stretches on black bar 2x30 seconds  Slant board gastroc stretch 2x30 seconds  Plantar fascia stretch on 2x30 seconds off step  Heel raises on slant board x10 Lunges on 12 inch box with OP into ankle DF surgical LE x10    Double stair taps x10 B standing on blue foam pad  3 way taps on blue foam pad- tap to step, lateral, retro x10 B  Side steps on blue foam with target taps x4 rounds   Side steps stepping over targets x4 rounds blue foam  Forward walking over blue foam stepping over targets x4 laps     08/28/23  Nustep L5 x8 minutes for w/u and tissue perfusion prior to testing Tandem stance on blue foam 3x30 seconds B  FOTO, objectives as above, goal check, education on progress thus far and potential benefit of continuing with PT but likely DC to advanced HEP at end of new cert period      08/24/23 Bike L3 x28mins  Step up on 6" step  from airex forward and lateral Calf stretch 15 x4 (2x on lower level and then 2x on higher slant)  Resisted gait 20# 4 way x4  Calf raises 2x15 Toe raises 2x15 Forward lunges on to  step Tandem stance on beam SLS on beam Small jumps on trampoline  STS on airex 2x5   08/21/23 Bike L 2.3 x 7 min Slant Board Calf stretch 4x15'' 20lb resisted gait 4 way x 3 each Heel raises 2x15  6in lateral step ups x10 each  L ankle PROM with end range holds, L ankle Jt mobs. 4 way ankle green 2x10 4in step downs Tmill pushes x20  08/17/23  Scifit bike L4x8 minutes for w/u and tissue perfusion Rocker board AP rocks x2 minutes for ROM Inversion/eversion on blue air pad x20 Ankle PF/DF x20 on blue air pad x20 Forward lunges onto soft surface of bosu x12 B SLS with one foot on soft surface of bosu 3x30 seconds B   Gastroc stetches 3x30 seconds surgical LE Plantar fascia stretches 3x30 seconds B  Ankle 4 way strengthening black band x12 each    08/14/23 Bike L4 x6mins  Calf stretches on slant 15s x3 Rocker board DF/PF x20 Seated ankle TB green 4 way x4  Standing on airex SLS 10s with UE support  Joint mobs in L foot and ankle, passive, gentle stretch into DF Step ups 4"  Walking on beam   08/10/23  Nustep L5x8 minutes all four extremities a little closer today for dorsiflexion stretch on surgical ankle Forward lunges on 12 inch step with OP from PT anterior side of joint for ankle DF ROM x10 (to tolerance) Rocker board AP x15 for PF/DF ROM assist from RLE PRN Seated BAPS CCW and CW x10 each way L LE    Seated ankle alphabet x3 Heel raises double leg x15  Toe raises double leg x15   Scar massage/mobility Desensitization techniques for surgical ankle as tolerated Retrograde massage for edema     08/07/23  Nustep L5 x8 minutes all four extremities Standing ankle PF/DF x15 on BAPs with other leg assisting Tandem stance blue foam pad 2x30 seconds B Tandem gait in // bars 3 laps  on foam    Seated ankle circles on BAPs x10 CW/CCW Seated ankle PF into blue TB 12x2 second holds cues for eccentric control  Seated ankle DF into blue TB 12x2 second holds cues for eccentric control  Heel raises in // bars x10 (double leg) Toe raises in // bars x10 (double leg) Double heel up, single (L) down x10 BUE support on bars Seated ankle alphabets x2   Education on desensitization of post-op ankle- use materials of different textures and alternate rubbing each one over sore spots when watching TV at night, continued to encourage elevation/ice        PATIENT EDUCATION:  Education details:  Modified HEP Person educated: Patient and Spouse Education method: Explanation, Facilities manager, Verbal cues Education comprehension:  verbalized understanding, returned demonstration, verbal cues required, and needs further education  HOME EXERCISE PROGRAM: [id number]  ASSESSMENT:  CLINICAL IMPRESSION:  Pt arrives today feeling well Overall despite soreness after last session.  She has progressed increasing her L ankle AROM in all directions. Mod cues to complete stairs with alt pattern, pt so use to step to pattern. She does reports a pull behind her L ankle descending stairs and with step downs. Continued working on a bit of ROM, balance, and strength. All interventions completed well. Pt is progressing well overall and will benefit from continues skilled PT to normalize gait and increase L ankle AROM and strength.   GOALS: Goals reviewed with patient? Yes  SHORT TERM GOALS: Target date: 09/11/2023     Ankle active  dorsiflexion to 5 degrees Baseline: Shy of 0 at evaluation Goal status: 08/28/23-  ONGOING, -10* today   2.  Able to stand with full weightbearing through foot pain less than or equal to 3 out of 10 Baseline: Unable to evaluation Goal status: 08/28/23 MET     LONG TERM GOALS: Target date: 09/25/2023      Meet Foto goal Baseline: See objective Goal status:  08/28/23- 44 (predicted 61)  2.  Able to demonstrate proper ambulation pattern without pain greater than 3 out of 10 for household distances Baseline:  Goal status: 09/07/23- PARTIALLY MET gait mechanics still not 100%   3.  Ankle dorsiflexion to 10 degrees Baseline:  Goal status: 09/07/23 ONGOING   4.  Able to navigate stairs ascending and descending without compensation Baseline:  Goal status: 09/07/23- Progressing cues for swquecing  PLAN:  PT FREQUENCY: 1-2x/week  PT DURATION: 5 weeks (backdated for continous cert coverage)  PLANNED INTERVENTIONS: 09811- PT Re-evaluation, 97110-Therapeutic exercises, 97530- Therapeutic activity, 97112- Neuromuscular re-education, 97535- Self Care, 91478- Manual therapy, [id number]- Gait training, [phone]- Aquatic Therapy, 97014- Electrical stimulation (unattended), 97016- Vasopneumatic device, Patient/Family education, Balance training, Stair training, Taping, Dry Needling, Joint mobilization, Spinal mobilization, Scar mobilization, Cryotherapy, and Moist heat.  PLAN FOR NEXT SESSION:  Continue work on strength, balance, ankle mm endurance. Manual techniques PRN/as desired   Next MD visit 11/24/23   Debroah Baller, PTA 09/07/23 10:13 AM

## 2023-09-11 ENCOUNTER — Encounter: Payer: Self-pay | Admitting: Physical Therapy

## 2023-09-11 ENCOUNTER — Ambulatory Visit: Payer: Medicare Other | Admitting: Physical Therapy

## 2023-09-11 ENCOUNTER — Ambulatory Visit: Admitting: Physical Therapy

## 2023-09-11 DIAGNOSIS — R600 Localized edema: Secondary | ICD-10-CM

## 2023-09-11 DIAGNOSIS — M25572 Pain in left ankle and joints of left foot: Secondary | ICD-10-CM | POA: Diagnosis not present

## 2023-09-11 DIAGNOSIS — R262 Difficulty in walking, not elsewhere classified: Secondary | ICD-10-CM

## 2023-09-11 DIAGNOSIS — M25672 Stiffness of left ankle, not elsewhere classified: Secondary | ICD-10-CM | POA: Diagnosis not present

## 2023-09-11 NOTE — Therapy (Signed)
 OUTPATIENT PHYSICAL THERAPY TREATMENT       Patient Name: Shelly Pearson MRN: [id number] DOB:March 10, 1941, 83 y.o., female Today's Date: 09/11/2023      END OF SESSION:  PT End of Session - 09/11/23 1043     Visit Number 21    Date for PT Re-Evaluation 09/25/23    PT Start Time 1045    PT Stop Time 1130    PT Time Calculation (min) 45 min    Activity Tolerance Patient tolerated treatment well    Behavior During Therapy [facility] for tasks assessed/performed                   Past Medical History:  Diagnosis Date   B12 deficiency 03/02/2020   GERD (gastroesophageal reflux disease)    Hypertension    Seasonal allergies    Thyroid disease    Urge incontinence of urine 12/10/2020   Urgency incontinence    Past Surgical History:  Procedure Laterality Date   ABDOMINAL HYSTERECTOMY     CESAREAN SECTION     CYSTOCELE REPAIR     ORIF ANKLE FRACTURE Left 06/08/2023   Procedure: LEFT OPEN REDUCTION INTERNAL FIXATION (ORIF) ANKLE FRACTURE;  Surgeon: Huel Cote, MD;  Location: [facility] ORS;  Service: Orthopedics;  Laterality: Left;   ROTATOR CUFF REPAIR Right    2018   Patient Active Problem List   Diagnosis Date Noted   Bimalleolar ankle fracture, left, closed, initial encounter 06/08/2023   Onychomycosis 10/07/2022   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), wet (HCC) 05/24/2022   Decreased grip strength of right hand 09/16/2021   Gait disturbance 07/12/2021   Mild cognitive impairment 07/12/2021   Thyroid goiter 12/10/2020   Mixed hyperlipidemia 12/10/2020   Degenerative cervical spinal stenosis, mild C4-C5 and C5-C6 12/10/2020   Lumbar degenerative disc disease, L4-L5 with left lateral recess stenosis 12/10/2020   Personal history of fall 09/01/2020   Congenital cavus deformity of both feet 03/02/2020   Metatarsalgia of both feet 03/02/2020   Hand arthritis 03/02/2020   Arthritis of carpometacarpal (CMC) joint of both thumbs 09/19/2019   Overactive bladder  09/19/2019   Sciatica 04/26/2019   Paresthesia of both feet 02/04/2019   Epidermal cyst 04/17/2018   Allergic rhinitis 06/23/2015   Eczema 06/23/2015   Gastro-esophageal reflux disease without esophagitis 06/23/2015   Essential hypertension 06/23/2015   Hypothyroidism 06/23/2015     REFERRING PROVIDER: Benancio Deeds, MD   REFERRING DIAG:  [phone] (ICD-10-CM) - Bimalleolar ankle fracture, left, closed, initial encounter     S/p ORIF L ankle bimalleolar fx  Rationale for Evaluation and Treatment: Rehabilitation  THERAPY DIAG:  Pain in left ankle and joints of left foot  Stiffness of left ankle, not elsewhere classified  Difficulty in walking, not elsewhere classified  Localized edema  ONSET DATE: DOS 06/08/23  SUBJECTIVE:  SUBJECTIVE STATEMENT:  Its been better the last few days, wearing her own shoes, walking has been more comfortable but have to be careful  PERTINENT HISTORY:  History of mild cognitive impairment, paresthesia of both feet and sciatica in history.  Patient reports memory limitations  PAIN:  Are you having pain? No 1/10  tender to touch  PRECAUTIONS:  None-ok to wean out of cam per MD note 07/19/23  RED FLAGS: None   WEIGHT BEARING RESTRICTIONS:  Yes WBAT with boot  FALLS:  Has patient fallen in last 6 months? Yes. Number of falls 1-this was the cause of the injury  LIVING ENVIRONMENT: Lives with: lives with their family and lives with their spouse   OCCUPATION:  Retired  PLOF:  Independent  PATIENT GOALS:  Return to walking, gardening, and other prior level of function  OBJECTIVE:  Note: Objective measures were completed at Evaluation unless otherwise noted.  ROM  AROM  Left 08/28/23 Left 09/07/23  Ankle DF  -10* 3  Ankle PF 43* 50  Ankle  inversion  12* 21  Ankle eversion 9* 12      MMT  Left  08/28/23  Ankle DF 5  Ankle PF  1  Ankle inversion  3  Ankle eversion  3     FUNCTIONAL TESTS 08/28/23  SLS 3 seconds L, 10 seconds R TUG 9.5 seconds no device    TREATMENT:    09/1023 NuStep L4 x 6 min L ankle PROM w/ end range holds  L anke Jt Mobs L ankle Tband green x12 each Sit to stand on airex 2x10 4in box on airex step ups x 10 each 20lb resisted side steps x5 each Heel raises form floor 2x10  Leg press 40lb 2x10, Heel Raises 40lb 2x10 HS curls 25lb 2x10 Leg Ext 5lb 2x10   09/07/23 L ankle PROM w/ end range holds  L anke Jt Mobs NuStep L4 x 6 min LE only Stair negotiation 4 & 6 in alt pattern 1 rail. Mods cues for sequencing 4in step downs Resisted gait 30lb 4 way x 3 each 8in lateral step ups form airex x10 each  Leg press 30lb 2x10, Heel Raises 30lb 2x12   09/04/23 L ankle PROM w/ end range holds  L anke Jt Mobs L ankel Green Tband 4 way x 15 each NuStep L4 x 6 min LE only Tmill pushes x20, 2x10 Slant board calf stretch 6in lateral step ups  20lb resistd side step over WaTE bar x5 each 4in step downs LLE stretch 2x10 HS curls 25lb 2x10 Leg Ext 5lb 2x10   08/31/23  Nustep L5 x8 minutes all four extremities for w/u, tissue perfusion prior to exercise  Gastroc stretches on black bar 2x30 seconds  Slant board gastroc stretch 2x30 seconds  Plantar fascia stretch on 2x30 seconds off step  Heel raises on slant board x10 Lunges on 12 inch box with OP into ankle DF surgical LE x10    Double stair taps x10 B standing on blue foam pad  3 way taps on blue foam pad- tap to step, lateral, retro x10 B  Side steps on blue foam with target taps x4 rounds   Side steps stepping over targets x4 rounds blue foam  Forward walking over blue foam stepping over targets x4 laps     08/28/23  Nustep L5 x8 minutes for w/u and tissue perfusion prior to testing Tandem stance on blue foam 3x30 seconds  B  FOTO, objectives as above, goal check,  education on progress thus far and potential benefit of continuing with PT but likely DC to advanced HEP at end of new cert period      08/24/23 Bike L3 x75mins  Step up on 6" step from airex forward and lateral Calf stretch 15 x4 (2x on lower level and then 2x on higher slant)  Resisted gait 20# 4 way x4  Calf raises 2x15 Toe raises 2x15 Forward lunges on to step Tandem stance on beam SLS on beam Small jumps on trampoline  STS on airex 2x5   08/21/23 Bike L 2.3 x 7 min Slant Board Calf stretch 4x15'' 20lb resisted gait 4 way x 3 each Heel raises 2x15  6in lateral step ups x10 each  L ankle PROM with end range holds, L ankle Jt mobs. 4 way ankle green 2x10 4in step downs Tmill pushes x20  08/17/23  Scifit bike L4x8 minutes for w/u and tissue perfusion Rocker board AP rocks x2 minutes for ROM Inversion/eversion on blue air pad x20 Ankle PF/DF x20 on blue air pad x20 Forward lunges onto soft surface of bosu x12 B SLS with one foot on soft surface of bosu 3x30 seconds B   Gastroc stetches 3x30 seconds surgical LE Plantar fascia stretches 3x30 seconds B  Ankle 4 way strengthening black band x12 each    08/14/23 Bike L4 x78mins  Calf stretches on slant 15s x3 Rocker board DF/PF x20 Seated ankle TB green 4 way x4  Standing on airex SLS 10s with UE support  Joint mobs in L foot and ankle, passive, gentle stretch into DF Step ups 4"  Walking on beam   08/10/23  Nustep L5x8 minutes all four extremities a little closer today for dorsiflexion stretch on surgical ankle Forward lunges on 12 inch step with OP from PT anterior side of joint for ankle DF ROM x10 (to tolerance) Rocker board AP x15 for PF/DF ROM assist from RLE PRN Seated BAPS CCW and CW x10 each way L LE    Seated ankle alphabet x3 Heel raises double leg x15  Toe raises double leg x15   Scar massage/mobility Desensitization techniques for surgical ankle as  tolerated Retrograde massage for edema     08/07/23  Nustep L5 x8 minutes all four extremities Standing ankle PF/DF x15 on BAPs with other leg assisting Tandem stance blue foam pad 2x30 seconds B Tandem gait in // bars 3 laps on foam    Seated ankle circles on BAPs x10 CW/CCW Seated ankle PF into blue TB 12x2 second holds cues for eccentric control  Seated ankle DF into blue TB 12x2 second holds cues for eccentric control  Heel raises in // bars x10 (double leg) Toe raises in // bars x10 (double leg) Double heel up, single (L) down x10 BUE support on bars Seated ankle alphabets x2   Education on desensitization of post-op ankle- use materials of different textures and alternate rubbing each one over sore spots when watching TV at night, continued to encourage elevation/ice        PATIENT EDUCATION:  Education details:  Modified HEP Person educated: Patient and Spouse Education method: Explanation, Facilities manager, Verbal cues Education comprehension:  verbalized understanding, returned demonstration, verbal cues required, and needs further education  HOME EXERCISE PROGRAM: [id number]  ASSESSMENT:  CLINICAL IMPRESSION:  Pt arrives today feeling well Overall wearing her own shoes. She reports improved gait and mobility. She does reports a pull behind her L ankle with step ups.  Continued working on a bit  of ROM, balance, and strength. Cues not to drag LE's with resisted side steps. All interventions completed well. Pt is progressing well overall and will benefit from continues skilled PT to normalize gait and increase L ankle AROM and strength.   GOALS: Goals reviewed with patient? Yes  SHORT TERM GOALS: Target date: 09/11/2023     Ankle active dorsiflexion to 5 degrees Baseline: Shy of 0 at evaluation Goal status: 08/28/23-  ONGOING, -10* today   2.  Able to stand with full weightbearing through foot pain less than or equal to 3 out of 10 Baseline: Unable to  evaluation Goal status: 08/28/23 MET     LONG TERM GOALS: Target date: 09/25/2023      Meet Foto goal Baseline: See objective Goal status: 08/28/23- 44 (predicted 61)  2.  Able to demonstrate proper ambulation pattern without pain greater than 3 out of 10 for household distances Baseline:  Goal status: 09/07/23- PARTIALLY MET gait mechanics still not 100%   3.  Ankle dorsiflexion to 10 degrees Baseline:  Goal status: 09/07/23 ONGOING   4.  Able to navigate stairs ascending and descending without compensation Baseline:  Goal status: 09/07/23- Progressing cues for swquecing  PLAN:  PT FREQUENCY: 1-2x/week  PT DURATION: 5 weeks (backdated for continous cert coverage)  PLANNED INTERVENTIONS: 16109- PT Re-evaluation, 97110-Therapeutic exercises, 97530- Therapeutic activity, 97112- Neuromuscular re-education, 97535- Self Care, [id number]- Manual therapy, [id number]- Gait training, [phone]- Aquatic Therapy, 97014- Electrical stimulation (unattended), 97016- Vasopneumatic device, Patient/Family education, Balance training, Stair training, Taping, Dry Needling, Joint mobilization, Spinal mobilization, Scar mobilization, Cryotherapy, and Moist heat.  PLAN FOR NEXT SESSION:  Continue work on strength, balance, ankle mm endurance. Manual techniques PRN/as desired   Next MD visit 11/24/23   Debroah Baller, PTA 09/11/23 10:46 AM

## 2023-09-14 ENCOUNTER — Ambulatory Visit: Payer: Medicare Other | Admitting: Physical Therapy

## 2023-09-14 ENCOUNTER — Encounter: Payer: Self-pay | Admitting: Physical Therapy

## 2023-09-14 DIAGNOSIS — M25572 Pain in left ankle and joints of left foot: Secondary | ICD-10-CM | POA: Diagnosis not present

## 2023-09-14 DIAGNOSIS — R262 Difficulty in walking, not elsewhere classified: Secondary | ICD-10-CM

## 2023-09-14 DIAGNOSIS — R600 Localized edema: Secondary | ICD-10-CM | POA: Diagnosis not present

## 2023-09-14 DIAGNOSIS — M25672 Stiffness of left ankle, not elsewhere classified: Secondary | ICD-10-CM | POA: Diagnosis not present

## 2023-09-14 NOTE — Therapy (Signed)
 OUTPATIENT PHYSICAL THERAPY TREATMENT       Patient Name: Shelly Pearson MRN: [id number] DOB:03-Jul-1941, 83 y.o., female Today's Date: 09/14/2023      END OF SESSION:  PT End of Session - 09/14/23 1009     Visit Number 22    Date for PT Re-Evaluation 09/25/23    PT Start Time 1010    PT Stop Time 1055    PT Time Calculation (min) 45 min    Activity Tolerance Patient tolerated treatment well    Behavior During Therapy [facility] for tasks assessed/performed                   Past Medical History:  Diagnosis Date   B12 deficiency 03/02/2020   GERD (gastroesophageal reflux disease)    Hypertension    Seasonal allergies    Thyroid disease    Urge incontinence of urine 12/10/2020   Urgency incontinence    Past Surgical History:  Procedure Laterality Date   ABDOMINAL HYSTERECTOMY     CESAREAN SECTION     CYSTOCELE REPAIR     ORIF ANKLE FRACTURE Left 06/08/2023   Procedure: LEFT OPEN REDUCTION INTERNAL FIXATION (ORIF) ANKLE FRACTURE;  Surgeon: Huel Cote, MD;  Location: [facility] ORS;  Service: Orthopedics;  Laterality: Left;   ROTATOR CUFF REPAIR Right    2018   Patient Active Problem List   Diagnosis Date Noted   Bimalleolar ankle fracture, left, closed, initial encounter 06/08/2023   Onychomycosis 10/07/2022   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), wet (HCC) 05/24/2022   Decreased grip strength of right hand 09/16/2021   Gait disturbance 07/12/2021   Mild cognitive impairment 07/12/2021   Thyroid goiter 12/10/2020   Mixed hyperlipidemia 12/10/2020   Degenerative cervical spinal stenosis, mild C4-C5 and C5-C6 12/10/2020   Lumbar degenerative disc disease, L4-L5 with left lateral recess stenosis 12/10/2020   Personal history of fall 09/01/2020   Congenital cavus deformity of both feet 03/02/2020   Metatarsalgia of both feet 03/02/2020   Hand arthritis 03/02/2020   Arthritis of carpometacarpal (CMC) joint of both thumbs 09/19/2019   Overactive bladder  09/19/2019   Sciatica 04/26/2019   Paresthesia of both feet 02/04/2019   Epidermal cyst 04/17/2018   Allergic rhinitis 06/23/2015   Eczema 06/23/2015   Gastro-esophageal reflux disease without esophagitis 06/23/2015   Essential hypertension 06/23/2015   Hypothyroidism 06/23/2015     REFERRING PROVIDER: Benancio Deeds, MD   REFERRING DIAG:  [phone] (ICD-10-CM) - Bimalleolar ankle fracture, left, closed, initial encounter     S/p ORIF L ankle bimalleolar fx  Rationale for Evaluation and Treatment: Rehabilitation  THERAPY DIAG:  Pain in left ankle and joints of left foot  Stiffness of left ankle, not elsewhere classified  Difficulty in walking, not elsewhere classified  Localized edema  ONSET DATE: DOS 06/08/23  SUBJECTIVE:  SUBJECTIVE STATEMENT: Doing pretty good, still get issue around the ankle at times but not like it was. It continues to get better. Has not woken up at night with pain  PERTINENT HISTORY:  History of mild cognitive impairment, paresthesia of both feet and sciatica in history.  Patient reports memory limitations  PAIN:  Are you having pain? No .5/10  tender to touch  PRECAUTIONS:  None-ok to wean out of cam per MD note 07/19/23  RED FLAGS: None   WEIGHT BEARING RESTRICTIONS:  Yes WBAT with boot  FALLS:  Has patient fallen in last 6 months? Yes. Number of falls 1-this was the cause of the injury  LIVING ENVIRONMENT: Lives with: lives with their family and lives with their spouse   OCCUPATION:  Retired  PLOF:  Independent  PATIENT GOALS:  Return to walking, gardening, and other prior level of function  OBJECTIVE:  Note: Objective measures were completed at Evaluation unless otherwise noted.  ROM  AROM  Left 08/28/23 Left 09/07/23 Left 09/14/23   Ankle DF  -10* 3 10  Ankle PF 43* 50 55  Ankle inversion  12* 21 35  Ankle eversion 9* 12 25      MMT  Left  08/28/23  Ankle DF 5  Ankle PF  1  Ankle inversion  3  Ankle eversion  3     FUNCTIONAL TESTS 08/28/23  SLS 3 seconds L, 10 seconds R TUG 9.5 seconds no device    TREATMENT:    09/14/23 Bike L 3 x 6 min L ankle PROM w/ end range holds  L anke Jt Mobs 4 way ankle Tband green 2x10 Sit to stand on airex 2x10 On airex alt Cone taps 2x10  Side steps in ans out upside down 6in box   Then upside box on airex Standing on dyna disk reaching outside base of support in // bars  Heel raises black bar 8in step ups x10 each  09/11/23 NuStep L4 x 6 min L ankle PROM w/ end range holds  L anke Jt Mobs L ankle Tband green x12 each Sit to stand on airex 2x10 4in box on airex step ups x 10 each 20lb resisted side steps x5 each Heel raises form floor 2x10  Leg press 40lb 2x10, Heel Raises 40lb 2x10 HS curls 25lb 2x10 Leg Ext 5lb 2x10   09/07/23 L ankle PROM w/ end range holds  L anke Jt Mobs NuStep L4 x 6 min LE only Stair negotiation 4 & 6 in alt pattern 1 rail. Mods cues for sequencing 4in step downs Resisted gait 30lb 4 way x 3 each 8in lateral step ups form airex x10 each  Leg press 30lb 2x10, Heel Raises 30lb 2x12   09/04/23 L ankle PROM w/ end range holds  L anke Jt Mobs L ankel Green Tband 4 way x 15 each NuStep L4 x 6 min LE only Tmill pushes x20, 2x10 Slant board calf stretch 6in lateral step ups  20lb resistd side step over WaTE bar x5 each 4in step downs LLE stretch 2x10 HS curls 25lb 2x10 Leg Ext 5lb 2x10   08/31/23  Nustep L5 x8 minutes all four extremities for w/u, tissue perfusion prior to exercise  Gastroc stretches on black bar 2x30 seconds  Slant board gastroc stretch 2x30 seconds  Plantar fascia stretch on 2x30 seconds off step  Heel raises on slant board x10 Lunges on 12 inch box with OP into ankle DF surgical LE x10    Double  stair taps x10 B standing on blue foam pad  3 way taps on blue foam pad- tap to step, lateral, retro x10 B  Side steps on blue foam with target taps x4 rounds   Side steps stepping over targets x4 rounds blue foam  Forward walking over blue foam stepping over targets x4 laps     08/28/23  Nustep L5 x8 minutes for w/u and tissue perfusion prior to testing Tandem stance on blue foam 3x30 seconds B  FOTO, objectives as above, goal check, education on progress thus far and potential benefit of continuing with PT but likely DC to advanced HEP at end of new cert period      08/24/23 Bike L3 x70mins  Step up on 6" step from airex forward and lateral Calf stretch 15 x4 (2x on lower level and then 2x on higher slant)  Resisted gait 20# 4 way x4  Calf raises 2x15 Toe raises 2x15 Forward lunges on to step Tandem stance on beam SLS on beam Small jumps on trampoline  STS on airex 2x5   08/21/23 Bike L 2.3 x 7 min Slant Board Calf stretch 4x15'' 20lb resisted gait 4 way x 3 each Heel raises 2x15  6in lateral step ups x10 each  L ankle PROM with end range holds, L ankle Jt mobs. 4 way ankle green 2x10 4in step downs Tmill pushes x20  08/17/23  Scifit bike L4x8 minutes for w/u and tissue perfusion Rocker board AP rocks x2 minutes for ROM Inversion/eversion on blue air pad x20 Ankle PF/DF x20 on blue air pad x20 Forward lunges onto soft surface of bosu x12 B SLS with one foot on soft surface of bosu 3x30 seconds B   Gastroc stetches 3x30 seconds surgical LE Plantar fascia stretches 3x30 seconds B  Ankle 4 way strengthening black band x12 each    08/14/23 Bike L4 x31mins  Calf stretches on slant 15s x3 Rocker board DF/PF x20 Seated ankle TB green 4 way x4  Standing on airex SLS 10s with UE support  Joint mobs in L foot and ankle, passive, gentle stretch into DF Step ups 4"  Walking on beam   08/10/23  Nustep L5x8 minutes all four extremities a little closer today for  dorsiflexion stretch on surgical ankle Forward lunges on 12 inch step with OP from PT anterior side of joint for ankle DF ROM x10 (to tolerance) Rocker board AP x15 for PF/DF ROM assist from RLE PRN Seated BAPS CCW and CW x10 each way L LE    Seated ankle alphabet x3 Heel raises double leg x15  Toe raises double leg x15   Scar massage/mobility Desensitization techniques for surgical ankle as tolerated Retrograde massage for edema     08/07/23  Nustep L5 x8 minutes all four extremities Standing ankle PF/DF x15 on BAPs with other leg assisting Tandem stance blue foam pad 2x30 seconds B Tandem gait in // bars 3 laps on foam    Seated ankle circles on BAPs x10 CW/CCW Seated ankle PF into blue TB 12x2 second holds cues for eccentric control  Seated ankle DF into blue TB 12x2 second holds cues for eccentric control  Heel raises in // bars x10 (double leg) Toe raises in // bars x10 (double leg) Double heel up, single (L) down x10 BUE support on bars Seated ankle alphabets x2   Education on desensitization of post-op ankle- use materials of different textures and alternate rubbing each one over sore spots when watching TV at  night, continued to encourage elevation/ice        PATIENT EDUCATION:  Education details:  Modified HEP Person educated: Patient and Spouse Education method: Explanation, Demonstration, Verbal cues Education comprehension:  verbalized understanding, returned demonstration, verbal cues required, and needs further education  HOME EXERCISE PROGRAM: [id number]  ASSESSMENT:  CLINICAL IMPRESSION:  Again pt arrives today feeling well Overall wearing her own shoes. She has progressed improving her L ankle AROM in all directions.   Continued working on a bit of ROM, balance, and strength. Eversion weakness present with resisted ankle Tband. L ankle doe pronate with sit to stands on non compliant surface. All interventions completed well. Pt is progressing well  overall and will benefit from continues skilled PT to normalize gait and increase L ankle AROM and strength.   GOALS: Goals reviewed with patient? Yes  SHORT TERM GOALS: Target date: 09/11/2023     Ankle active dorsiflexion to 5 degrees Baseline: Shy of 0 at evaluation Goal status: 08/28/23-  ONGOING, -10* today, Met 09/14/23  2.  Able to stand with full weightbearing through foot pain less than or equal to 3 out of 10 Baseline: Unable to evaluation Goal status: 08/28/23 MET     LONG TERM GOALS: Target date: 09/25/2023    Meet Foto goal Baseline: See objective Goal status: 08/28/23- 44 (predicted 61)  2.  Able to demonstrate proper ambulation pattern without pain greater than 3 out of 10 for household distances Baseline:  Goal status: 09/07/23- PARTIALLY MET gait mechanics still not 100%   3.  Ankle dorsiflexion to 10 degrees Baseline:  Goal status: 09/07/23 ONGOING, Met 09/14/23  4.  Able to navigate stairs ascending and descending without compensation Baseline:  Goal status: 09/07/23- Progressing cues for swquecing  PLAN:  PT FREQUENCY: 1-2x/week  PT DURATION: 5 weeks (backdated for continous cert coverage)  PLANNED INTERVENTIONS: 16109- PT Re-evaluation, 97110-Therapeutic exercises, 97530- Therapeutic activity, 97112- Neuromuscular re-education, 97535- Self Care, 60454- Manual therapy, [id number]- Gait training, [phone]- Aquatic Therapy, 97014- Electrical stimulation (unattended), 97016- Vasopneumatic device, Patient/Family education, Balance training, Stair training, Taping, Dry Needling, Joint mobilization, Spinal mobilization, Scar mobilization, Cryotherapy, and Moist heat.  PLAN FOR NEXT SESSION:  Continue work on strength, balance, ankle mm endurance. Manual techniques PRN/as desired   Next MD visit 11/24/23   Debroah Baller, PTA 09/14/23 10:11 AM

## 2023-09-15 DIAGNOSIS — H353211 Exudative age-related macular degeneration, right eye, with active choroidal neovascularization: Secondary | ICD-10-CM | POA: Diagnosis not present

## 2023-09-15 NOTE — Therapy (Signed)
 OUTPATIENT PHYSICAL THERAPY TREATMENT       Patient Name: Shelly Pearson MRN: [id number] DOB:1941/05/23, 83 y.o., female Today's Date: 09/18/2023      END OF SESSION:  PT End of Session - 09/18/23 1017     Visit Number 23    Date for PT Re-Evaluation 09/25/23    PT Start Time 1015    PT Stop Time 1100    PT Time Calculation (min) 45 min    Activity Tolerance Patient tolerated treatment well    Behavior During Therapy [facility] for tasks assessed/performed                    Past Medical History:  Diagnosis Date   B12 deficiency 03/02/2020   GERD (gastroesophageal reflux disease)    Hypertension    Seasonal allergies    Thyroid disease    Urge incontinence of urine 12/10/2020   Urgency incontinence    Past Surgical History:  Procedure Laterality Date   ABDOMINAL HYSTERECTOMY     CESAREAN SECTION     CYSTOCELE REPAIR     ORIF ANKLE FRACTURE Left 06/08/2023   Procedure: LEFT OPEN REDUCTION INTERNAL FIXATION (ORIF) ANKLE FRACTURE;  Surgeon: Huel Cote, MD;  Location: [facility] ORS;  Service: Orthopedics;  Laterality: Left;   ROTATOR CUFF REPAIR Right    2018   Patient Active Problem List   Diagnosis Date Noted   Bimalleolar ankle fracture, left, closed, initial encounter 06/08/2023   Onychomycosis 10/07/2022   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), wet (HCC) 05/24/2022   Decreased grip strength of right hand 09/16/2021   Gait disturbance 07/12/2021   Mild cognitive impairment 07/12/2021   Thyroid goiter 12/10/2020   Mixed hyperlipidemia 12/10/2020   Degenerative cervical spinal stenosis, mild C4-C5 and C5-C6 12/10/2020   Lumbar degenerative disc disease, L4-L5 with left lateral recess stenosis 12/10/2020   Personal history of fall 09/01/2020   Congenital cavus deformity of both feet 03/02/2020   Metatarsalgia of both feet 03/02/2020   Hand arthritis 03/02/2020   Arthritis of carpometacarpal (CMC) joint of both thumbs 09/19/2019   Overactive bladder  09/19/2019   Sciatica 04/26/2019   Paresthesia of both feet 02/04/2019   Epidermal cyst 04/17/2018   Allergic rhinitis 06/23/2015   Eczema 06/23/2015   Gastro-esophageal reflux disease without esophagitis 06/23/2015   Essential hypertension 06/23/2015   Hypothyroidism 06/23/2015     REFERRING PROVIDER: Benancio Deeds, MD   REFERRING DIAG:  [phone] (ICD-10-CM) - Bimalleolar ankle fracture, left, closed, initial encounter     S/p ORIF L ankle bimalleolar fx  Rationale for Evaluation and Treatment: Rehabilitation  THERAPY DIAG:  Stiffness of left ankle, not elsewhere classified  Pain in left ankle and joints of left foot  Difficulty in walking, not elsewhere classified  ONSET DATE: DOS 06/08/23  SUBJECTIVE:  SUBJECTIVE STATEMENT: I am doing really well, I am further along than I thought I would be from the beginning   PERTINENT HISTORY:  History of mild cognitive impairment, paresthesia of both feet and sciatica in history.  Patient reports memory limitations  PAIN:  Are you having pain? No .5/10  tender to touch  PRECAUTIONS:  None-ok to wean out of cam per MD note 07/19/23  RED FLAGS: None   WEIGHT BEARING RESTRICTIONS:  Yes WBAT with boot  FALLS:  Has patient fallen in last 6 months? Yes. Number of falls 1-this was the cause of the injury  LIVING ENVIRONMENT: Lives with: lives with their family and lives with their spouse   OCCUPATION:  Retired  PLOF:  Independent  PATIENT GOALS:  Return to walking, gardening, and other prior level of function  OBJECTIVE:  Note: Objective measures were completed at Evaluation unless otherwise noted.  ROM  AROM  Left 08/28/23 Left 09/07/23 Left 09/14/23  Ankle DF  -10* 3 10  Ankle PF 43* 50 55  Ankle inversion  12* 21 35  Ankle  eversion 9* 12 25      MMT  Left  08/28/23  Ankle DF 5  Ankle PF  1  Ankle inversion  3  Ankle eversion  3     FUNCTIONAL TESTS 08/28/23  SLS 3 seconds L, 10 seconds R TUG 9.5 seconds no device    TREATMENT:    09/18/23 Bike L4x20mins  Treadmill pushes  Step ups on BOSU Heel raises 2x12 Resisted gait 20# 4 way x4  Step ups from airex   Standing on dyna disk balance, SL on disk Rocker board forward/back and side to side  Trampoline jumps double and single leg x10   09/14/23 Bike L 3 x 6 min L ankle PROM w/ end range holds  L anke Jt Mobs 4 way ankle Tband green 2x10 Sit to stand on airex 2x10 On airex alt Cone taps 2x10  Side steps in ans out upside down 6in box   Then upside box on airex Standing on dyna disk reaching outside base of support in // bars  Heel raises black bar 8in step ups x10 each  09/11/23 NuStep L4 x 6 min L ankle PROM w/ end range holds  L anke Jt Mobs L ankle Tband green x12 each Sit to stand on airex 2x10 4in box on airex step ups x 10 each 20lb resisted side steps x5 each Heel raises form floor 2x10  Leg press 40lb 2x10, Heel Raises 40lb 2x10 HS curls 25lb 2x10 Leg Ext 5lb 2x10   09/07/23 L ankle PROM w/ end range holds  L anke Jt Mobs NuStep L4 x 6 min LE only Stair negotiation 4 & 6 in alt pattern 1 rail. Mods cues for sequencing 4in step downs Resisted gait 30lb 4 way x 3 each 8in lateral step ups form airex x10 each  Leg press 30lb 2x10, Heel Raises 30lb 2x12   09/04/23 L ankle PROM w/ end range holds  L anke Jt Mobs L ankel Green Tband 4 way x 15 each NuStep L4 x 6 min LE only Tmill pushes x20, 2x10 Slant board calf stretch 6in lateral step ups  20lb resistd side step over WaTE bar x5 each 4in step downs LLE stretch 2x10 HS curls 25lb 2x10 Leg Ext 5lb 2x10   08/31/23  Nustep L5 x8 minutes all four extremities for w/u, tissue perfusion prior to exercise  Gastroc stretches on black bar 2x30  seconds  Slant board  gastroc stretch 2x30 seconds  Plantar fascia stretch on 2x30 seconds off step  Heel raises on slant board x10 Lunges on 12 inch box with OP into ankle DF surgical LE x10    Double stair taps x10 B standing on blue foam pad  3 way taps on blue foam pad- tap to step, lateral, retro x10 B  Side steps on blue foam with target taps x4 rounds   Side steps stepping over targets x4 rounds blue foam  Forward walking over blue foam stepping over targets x4 laps     08/28/23  Nustep L5 x8 minutes for w/u and tissue perfusion prior to testing Tandem stance on blue foam 3x30 seconds B  FOTO, objectives as above, goal check, education on progress thus far and potential benefit of continuing with PT but likely DC to advanced HEP at end of new cert period      08/24/23 Bike L3 x31mins  Step up on 6" step from airex forward and lateral Calf stretch 15 x4 (2x on lower level and then 2x on higher slant)  Resisted gait 20# 4 way x4  Calf raises 2x15 Toe raises 2x15 Forward lunges on to step Tandem stance on beam SLS on beam Small jumps on trampoline  STS on airex 2x5   08/21/23 Bike L 2.3 x 7 min Slant Board Calf stretch 4x15'' 20lb resisted gait 4 way x 3 each Heel raises 2x15  6in lateral step ups x10 each  L ankle PROM with end range holds, L ankle Jt mobs. 4 way ankle green 2x10 4in step downs Tmill pushes x20  08/17/23  Scifit bike L4x8 minutes for w/u and tissue perfusion Rocker board AP rocks x2 minutes for ROM Inversion/eversion on blue air pad x20 Ankle PF/DF x20 on blue air pad x20 Forward lunges onto soft surface of bosu x12 B SLS with one foot on soft surface of bosu 3x30 seconds B   Gastroc stetches 3x30 seconds surgical LE Plantar fascia stretches 3x30 seconds B  Ankle 4 way strengthening black band x12 each    08/14/23 Bike L4 x44mins  Calf stretches on slant 15s x3 Rocker board DF/PF x20 Seated ankle TB green 4 way x4  Standing on airex SLS 10s with UE  support  Joint mobs in L foot and ankle, passive, gentle stretch into DF Step ups 4"  Walking on beam   08/10/23  Nustep L5x8 minutes all four extremities a little closer today for dorsiflexion stretch on surgical ankle Forward lunges on 12 inch step with OP from PT anterior side of joint for ankle DF ROM x10 (to tolerance) Rocker board AP x15 for PF/DF ROM assist from RLE PRN Seated BAPS CCW and CW x10 each way L LE    Seated ankle alphabet x3 Heel raises double leg x15  Toe raises double leg x15   Scar massage/mobility Desensitization techniques for surgical ankle as tolerated Retrograde massage for edema     08/07/23  Nustep L5 x8 minutes all four extremities Standing ankle PF/DF x15 on BAPs with other leg assisting Tandem stance blue foam pad 2x30 seconds B Tandem gait in // bars 3 laps on foam    Seated ankle circles on BAPs x10 CW/CCW Seated ankle PF into blue TB 12x2 second holds cues for eccentric control  Seated ankle DF into blue TB 12x2 second holds cues for eccentric control  Heel raises in // bars x10 (double leg) Toe raises in // bars x10 (  double leg) Double heel up, single (L) down x10 BUE support on bars Seated ankle alphabets x2   Education on desensitization of post-op ankle- use materials of different textures and alternate rubbing each one over sore spots when watching TV at night, continued to encourage elevation/ice        PATIENT EDUCATION:  Education details:  Modified HEP Person educated: Patient and Spouse Education method: Explanation, Demonstration, Verbal cues Education comprehension:  verbalized understanding, returned demonstration, verbal cues required, and needs further education  HOME EXERCISE PROGRAM: [id number]  ASSESSMENT:  CLINICAL IMPRESSION: Again pt arrives today feeling well overall and is able to wear her own shoes. Continued working on ROM, balance, and strength. Ankle stability was the hardest today on  un-compromised surfaces. All interventions completed well with no pain. Pt is progressing well overall and will benefit from continues skilled PT to normalize gait and increase L ankle AROM and strength.   GOALS: Goals reviewed with patient? Yes  SHORT TERM GOALS: Target date: 09/11/2023     Ankle active dorsiflexion to 5 degrees Baseline: Shy of 0 at evaluation Goal status: 08/28/23-  ONGOING, -10* today, Met 09/14/23  2.  Able to stand with full weightbearing through foot pain less than or equal to 3 out of 10 Baseline: Unable to evaluation Goal status: 08/28/23 MET     LONG TERM GOALS: Target date: 09/25/2023    Meet Foto goal Baseline: See objective Goal status: 08/28/23- 44 (predicted 61)  2.  Able to demonstrate proper ambulation pattern without pain greater than 3 out of 10 for household distances Baseline:  Goal status: 09/07/23- PARTIALLY MET gait mechanics still not 100%   3.  Ankle dorsiflexion to 10 degrees Baseline:  Goal status: 09/07/23 ONGOING, Met 09/14/23  4.  Able to navigate stairs ascending and descending without compensation Baseline:  Goal status: 09/07/23- Progressing cues for swquecing  PLAN:  PT FREQUENCY: 1-2x/week  PT DURATION: 5 weeks (backdated for continous cert coverage)  PLANNED INTERVENTIONS: 62130- PT Re-evaluation, 97110-Therapeutic exercises, 97530- Therapeutic activity, 97112- Neuromuscular re-education, 97535- Self Care, 86578- Manual therapy, [id number]- Gait training, [phone]- Aquatic Therapy, 97014- Electrical stimulation (unattended), 97016- Vasopneumatic device, Patient/Family education, Balance training, Stair training, Taping, Dry Needling, Joint mobilization, Spinal mobilization, Scar mobilization, Cryotherapy, and Moist heat.  PLAN FOR NEXT SESSION:  Continue work on strength, balance, ankle mm endurance. Manual techniques PRN/as desired   Next MD visit 11/24/23   Debroah Baller, PTA 09/18/23 11:02 AM

## 2023-09-18 ENCOUNTER — Ambulatory Visit: Payer: Medicare Other

## 2023-09-18 DIAGNOSIS — R600 Localized edema: Secondary | ICD-10-CM | POA: Diagnosis not present

## 2023-09-18 DIAGNOSIS — M25672 Stiffness of left ankle, not elsewhere classified: Secondary | ICD-10-CM

## 2023-09-18 DIAGNOSIS — M25572 Pain in left ankle and joints of left foot: Secondary | ICD-10-CM

## 2023-09-18 DIAGNOSIS — R262 Difficulty in walking, not elsewhere classified: Secondary | ICD-10-CM | POA: Diagnosis not present

## 2023-09-21 ENCOUNTER — Encounter: Payer: Self-pay | Admitting: Physical Therapy

## 2023-09-21 ENCOUNTER — Ambulatory Visit: Payer: Medicare Other | Admitting: Physical Therapy

## 2023-09-21 DIAGNOSIS — R600 Localized edema: Secondary | ICD-10-CM

## 2023-09-21 DIAGNOSIS — R262 Difficulty in walking, not elsewhere classified: Secondary | ICD-10-CM | POA: Diagnosis not present

## 2023-09-21 DIAGNOSIS — M25572 Pain in left ankle and joints of left foot: Secondary | ICD-10-CM | POA: Diagnosis not present

## 2023-09-21 DIAGNOSIS — M25672 Stiffness of left ankle, not elsewhere classified: Secondary | ICD-10-CM | POA: Diagnosis not present

## 2023-09-21 NOTE — Therapy (Signed)
 OUTPATIENT PHYSICAL THERAPY TREATMENT       Patient Name: Shelly Pearson MRN: [id number] DOB:03/16/1941, 83 y.o., female Today's Date: 09/21/2023      END OF SESSION:  PT End of Session - 09/21/23 1008     Visit Number 24    Date for PT Re-Evaluation 09/25/23    PT Start Time 1010    PT Stop Time 1055    PT Time Calculation (min) 45 min    Activity Tolerance Patient tolerated treatment well    Behavior During Therapy [facility] for tasks assessed/performed                    Past Medical History:  Diagnosis Date   B12 deficiency 03/02/2020   GERD (gastroesophageal reflux disease)    Hypertension    Seasonal allergies    Thyroid disease    Urge incontinence of urine 12/10/2020   Urgency incontinence    Past Surgical History:  Procedure Laterality Date   ABDOMINAL HYSTERECTOMY     CESAREAN SECTION     CYSTOCELE REPAIR     ORIF ANKLE FRACTURE Left 06/08/2023   Procedure: LEFT OPEN REDUCTION INTERNAL FIXATION (ORIF) ANKLE FRACTURE;  Surgeon: Huel Cote, MD;  Location: [facility] ORS;  Service: Orthopedics;  Laterality: Left;   ROTATOR CUFF REPAIR Right    2018   Patient Active Problem List   Diagnosis Date Noted   Bimalleolar ankle fracture, left, closed, initial encounter 06/08/2023   Onychomycosis 10/07/2022   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), wet (HCC) 05/24/2022   Decreased grip strength of right hand 09/16/2021   Gait disturbance 07/12/2021   Mild cognitive impairment 07/12/2021   Thyroid goiter 12/10/2020   Mixed hyperlipidemia 12/10/2020   Degenerative cervical spinal stenosis, mild C4-C5 and C5-C6 12/10/2020   Lumbar degenerative disc disease, L4-L5 with left lateral recess stenosis 12/10/2020   Personal history of fall 09/01/2020   Congenital cavus deformity of both feet 03/02/2020   Metatarsalgia of both feet 03/02/2020   Hand arthritis 03/02/2020   Arthritis of carpometacarpal (CMC) joint of both thumbs 09/19/2019   Overactive bladder  09/19/2019   Sciatica 04/26/2019   Paresthesia of both feet 02/04/2019   Epidermal cyst 04/17/2018   Allergic rhinitis 06/23/2015   Eczema 06/23/2015   Gastro-esophageal reflux disease without esophagitis 06/23/2015   Essential hypertension 06/23/2015   Hypothyroidism 06/23/2015     REFERRING PROVIDER: Benancio Deeds, MD   REFERRING DIAG:  [phone] (ICD-10-CM) - Bimalleolar ankle fracture, left, closed, initial encounter     S/p ORIF L ankle bimalleolar fx  Rationale for Evaluation and Treatment: Rehabilitation  THERAPY DIAG:  Stiffness of left ankle, not elsewhere classified  Pain in left ankle and joints of left foot  Difficulty in walking, not elsewhere classified  Localized edema  ONSET DATE: DOS 06/08/23  SUBJECTIVE:  SUBJECTIVE STATEMENT: "A lot better" tenderness with light touch  PERTINENT HISTORY:  History of mild cognitive impairment, paresthesia of both feet and sciatica in history.  Patient reports memory limitations  PAIN:  Are you having pain? No 0/10  tender to touch  PRECAUTIONS:  None-ok to wean out of cam per MD note 07/19/23  RED FLAGS: None   WEIGHT BEARING RESTRICTIONS:  Yes WBAT with boot  FALLS:  Has patient fallen in last 6 months? Yes. Number of falls 1-this was the cause of the injury  LIVING ENVIRONMENT: Lives with: lives with their family and lives with their spouse   OCCUPATION:  Retired  PLOF:  Independent  PATIENT GOALS:  Return to walking, gardening, and other prior level of function  OBJECTIVE:  Note: Objective measures were completed at Evaluation unless otherwise noted.  ROM  AROM  Left 08/28/23 Left 09/07/23 Left 09/14/23 09/21/23  Ankle DF  -10* 3 10 10   Ankle PF 43* 50 55 56  Ankle inversion  12* 21 35 38  Ankle eversion 9*  12 25 27       MMT  Left  08/28/23  Ankle DF 5  Ankle PF  1  Ankle inversion  3  Ankle eversion  3     FUNCTIONAL TESTS 08/28/23  SLS 3 seconds L, 10 seconds R TUG 9.5 seconds no device    TREATMENT:    09/21/23 NuStep L4 x 6 min L ankle PROM w/ end range holds  L anke Jt Mobs Gait outside to back building up and down stairs one rail L alt pattern then back down out door up hill  L ankle eccentric load weakness when descending stairs "It gives" 8in step ups from airex forward and lateral x 10 each HS curls 25lb 2x12 Leg Ext 5lb 2x12 Heel raises black bar 2x15  L ankle green Tband 2x10 4 way  09/18/23 Bike L4x63mins  Treadmill pushes  Step ups on BOSU Heel raises 2x12 Resisted gait 20# 4 way x4  Step ups from airex   Standing on dyna disk balance, SL on disk Rocker board forward/back and side to side  Trampoline jumps double and single leg x10   09/14/23 Bike L 3 x 6 min L ankle PROM w/ end range holds  L anke Jt Mobs 4 way ankle Tband green 2x10 Sit to stand on airex 2x10 On airex alt Cone taps 2x10  Side steps in ans out upside down 6in box   Then upside box on airex Standing on dyna disk reaching outside base of support in // bars  Heel raises black bar 8in step ups x10 each  09/11/23 NuStep L4 x 6 min L ankle PROM w/ end range holds  L anke Jt Mobs L ankle Tband green x12 each Sit to stand on airex 2x10 4in box on airex step ups x 10 each 20lb resisted side steps x5 each Heel raises form floor 2x10  Leg press 40lb 2x10, Heel Raises 40lb 2x10 HS curls 25lb 2x10 Leg Ext 5lb 2x10   09/07/23 L ankle PROM w/ end range holds  L anke Jt Mobs NuStep L4 x 6 min LE only Stair negotiation 4 & 6 in alt pattern 1 rail. Mods cues for sequencing 4in step downs Resisted gait 30lb 4 way x 3 each 8in lateral step ups form airex x10 each  Leg press 30lb 2x10, Heel Raises 30lb 2x12   09/04/23 L ankle PROM w/ end range holds  L anke Jt Mobs L  ankel Green Tband  4 way x 15 each NuStep L4 x 6 min LE only Tmill pushes x20, 2x10 Slant board calf stretch 6in lateral step ups  20lb resistd side step over WaTE bar x5 each 4in step downs LLE stretch 2x10 HS curls 25lb 2x10 Leg Ext 5lb 2x10   08/31/23  Nustep L5 x8 minutes all four extremities for w/u, tissue perfusion prior to exercise  Gastroc stretches on black bar 2x30 seconds  Slant board gastroc stretch 2x30 seconds  Plantar fascia stretch on 2x30 seconds off step  Heel raises on slant board x10 Lunges on 12 inch box with OP into ankle DF surgical LE x10    Double stair taps x10 B standing on blue foam pad  3 way taps on blue foam pad- tap to step, lateral, retro x10 B  Side steps on blue foam with target taps x4 rounds   Side steps stepping over targets x4 rounds blue foam  Forward walking over blue foam stepping over targets x4 laps     08/28/23  Nustep L5 x8 minutes for w/u and tissue perfusion prior to testing Tandem stance on blue foam 3x30 seconds B  FOTO, objectives as above, goal check, education on progress thus far and potential benefit of continuing with PT but likely DC to advanced HEP at end of new cert period      08/24/23 Bike L3 x69mins  Step up on 6" step from airex forward and lateral Calf stretch 15 x4 (2x on lower level and then 2x on higher slant)  Resisted gait 20# 4 way x4  Calf raises 2x15 Toe raises 2x15 Forward lunges on to step Tandem stance on beam SLS on beam Small jumps on trampoline  STS on airex 2x5   08/21/23 Bike L 2.3 x 7 min Slant Board Calf stretch 4x15'' 20lb resisted gait 4 way x 3 each Heel raises 2x15  6in lateral step ups x10 each  L ankle PROM with end range holds, L ankle Jt mobs. 4 way ankle green 2x10 4in step downs Tmill pushes x20  08/17/23  Scifit bike L4x8 minutes for w/u and tissue perfusion Rocker board AP rocks x2 minutes for ROM Inversion/eversion on blue air pad x20 Ankle PF/DF x20 on blue air pad  x20 Forward lunges onto soft surface of bosu x12 B SLS with one foot on soft surface of bosu 3x30 seconds B   Gastroc stetches 3x30 seconds surgical LE Plantar fascia stretches 3x30 seconds B  Ankle 4 way strengthening black band x12 each    08/14/23 Bike L4 x6mins  Calf stretches on slant 15s x3 Rocker board DF/PF x20 Seated ankle TB green 4 way x4  Standing on airex SLS 10s with UE support  Joint mobs in L foot and ankle, passive, gentle stretch into DF Step ups 4"  Walking on beam   08/10/23  Nustep L5x8 minutes all four extremities a little closer today for dorsiflexion stretch on surgical ankle Forward lunges on 12 inch step with OP from PT anterior side of joint for ankle DF ROM x10 (to tolerance) Rocker board AP x15 for PF/DF ROM assist from RLE PRN Seated BAPS CCW and CW x10 each way L LE    Seated ankle alphabet x3 Heel raises double leg x15  Toe raises double leg x15   Scar massage/mobility Desensitization techniques for surgical ankle as tolerated Retrograde massage for edema     08/07/23  Nustep L5 x8 minutes all four extremities Standing ankle PF/DF  x15 on BAPs with other leg assisting Tandem stance blue foam pad 2x30 seconds B Tandem gait in // bars 3 laps on foam    Seated ankle circles on BAPs x10 CW/CCW Seated ankle PF into blue TB 12x2 second holds cues for eccentric control  Seated ankle DF into blue TB 12x2 second holds cues for eccentric control  Heel raises in // bars x10 (double leg) Toe raises in // bars x10 (double leg) Double heel up, single (L) down x10 BUE support on bars Seated ankle alphabets x2   Education on desensitization of post-op ankle- use materials of different textures and alternate rubbing each one over sore spots when watching TV at night, continued to encourage elevation/ice        PATIENT EDUCATION:  Education details:  Modified HEP Person educated: Patient and Spouse Education method: Explanation,  Demonstration, Verbal cues Education comprehension:  verbalized understanding, returned demonstration, verbal cues required, and needs further education  HOME EXERCISE PROGRAM: [id number]  ASSESSMENT:  CLINICAL IMPRESSION: Again pt arrives today feeling well overall and is able to wear her own shoes. Continued working on ROM, balance, and strength. Progressed to outdoor ambulation up and down slopes. Added more stair negotiation exposing some  L ankle weakness.  All interventions completed well with no pain. Pt is progressing well overall and will benefit from continues skilled PT to normalize gait and increase L ankle AROM and strength.   GOALS: Goals reviewed with patient? Yes  SHORT TERM GOALS: Target date: 09/11/2023     Ankle active dorsiflexion to 5 degrees Baseline: Shy of 0 at evaluation Goal status: 08/28/23-  ONGOING, -10* today, Met 09/14/23  2.  Able to stand with full weightbearing through foot pain less than or equal to 3 out of 10 Baseline: Unable to evaluation Goal status: 08/28/23 MET     LONG TERM GOALS: Target date: 09/25/2023    Meet Foto goal Baseline: See objective Goal status: 08/28/23- 44 (predicted 61)  2.  Able to demonstrate proper ambulation pattern without pain greater than 3 out of 10 for household distances Baseline:  Goal status: 09/21/23- PARTIALLY MET gait mechanics still not 100%   3.  Ankle dorsiflexion to 10 degrees Baseline:  Goal status: 09/07/23 ONGOING,  Met 09/14/23  4.  Able to navigate stairs ascending and descending without compensation Baseline:  Goal status: 09/21/23- L ankle weakness when descending PLAN:  PT FREQUENCY: 1-2x/week  PT DURATION: 5 weeks (backdated for continous cert coverage)  PLANNED INTERVENTIONS: 16109- PT Re-evaluation, 97110-Therapeutic exercises, 97530- Therapeutic activity, 97112- Neuromuscular re-education, 97535- Self Care, 60454- Manual therapy, [id number]- Gait training, [phone]- Aquatic Therapy, 97014-  Electrical stimulation (unattended), 97016- Vasopneumatic device, Patient/Family education, Balance training, Stair training, Taping, Dry Needling, Joint mobilization, Spinal mobilization, Scar mobilization, Cryotherapy, and Moist heat.  PLAN FOR NEXT SESSION:  Continue work on strength, balance, ankle mm endurance. Manual techniques PRN/as desired   Next MD visit 11/24/23   Debroah Baller, PTA 09/21/23 10:09 AM

## 2023-09-25 ENCOUNTER — Encounter: Payer: Self-pay | Admitting: Physical Therapy

## 2023-09-25 ENCOUNTER — Ambulatory Visit: Payer: Medicare Other | Admitting: Physical Therapy

## 2023-09-25 ENCOUNTER — Other Ambulatory Visit: Payer: Self-pay | Admitting: Family Medicine

## 2023-09-25 DIAGNOSIS — R600 Localized edema: Secondary | ICD-10-CM | POA: Diagnosis not present

## 2023-09-25 DIAGNOSIS — N3281 Overactive bladder: Secondary | ICD-10-CM

## 2023-09-25 DIAGNOSIS — E039 Hypothyroidism, unspecified: Secondary | ICD-10-CM

## 2023-09-25 DIAGNOSIS — R262 Difficulty in walking, not elsewhere classified: Secondary | ICD-10-CM

## 2023-09-25 DIAGNOSIS — M25672 Stiffness of left ankle, not elsewhere classified: Secondary | ICD-10-CM | POA: Diagnosis not present

## 2023-09-25 DIAGNOSIS — E782 Mixed hyperlipidemia: Secondary | ICD-10-CM

## 2023-09-25 DIAGNOSIS — I10 Essential (primary) hypertension: Secondary | ICD-10-CM

## 2023-09-25 DIAGNOSIS — M25572 Pain in left ankle and joints of left foot: Secondary | ICD-10-CM | POA: Diagnosis not present

## 2023-09-25 NOTE — Therapy (Signed)
 OUTPATIENT PHYSICAL THERAPY TREATMENT       Patient Name: Shelly Pearson MRN: [id number] DOB:Jun 26, 1941, 83 y.o., female Today's Date: 09/25/2023      END OF SESSION:  PT End of Session - 09/25/23 1014     Visit Number 25    Date for PT Re-Evaluation 09/25/23    PT Start Time 1015    PT Stop Time 1100    PT Time Calculation (min) 45 min    Activity Tolerance Patient tolerated treatment well    Behavior During Therapy [facility] for tasks assessed/performed                    Past Medical History:  Diagnosis Date   B12 deficiency 03/02/2020   GERD (gastroesophageal reflux disease)    Hypertension    Seasonal allergies    Thyroid disease    Urge incontinence of urine 12/10/2020   Urgency incontinence    Past Surgical History:  Procedure Laterality Date   ABDOMINAL HYSTERECTOMY     CESAREAN SECTION     CYSTOCELE REPAIR     ORIF ANKLE FRACTURE Left 06/08/2023   Procedure: LEFT OPEN REDUCTION INTERNAL FIXATION (ORIF) ANKLE FRACTURE;  Surgeon: Huel Cote, MD;  Location: [facility] ORS;  Service: Orthopedics;  Laterality: Left;   ROTATOR CUFF REPAIR Right    2018   Patient Active Problem List   Diagnosis Date Noted   Bimalleolar ankle fracture, left, closed, initial encounter 06/08/2023   Onychomycosis 10/07/2022   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), wet (HCC) 05/24/2022   Decreased grip strength of right hand 09/16/2021   Gait disturbance 07/12/2021   Mild cognitive impairment 07/12/2021   Thyroid goiter 12/10/2020   Mixed hyperlipidemia 12/10/2020   Degenerative cervical spinal stenosis, mild C4-C5 and C5-C6 12/10/2020   Lumbar degenerative disc disease, L4-L5 with left lateral recess stenosis 12/10/2020   Personal history of fall 09/01/2020   Congenital cavus deformity of both feet 03/02/2020   Metatarsalgia of both feet 03/02/2020   Hand arthritis 03/02/2020   Arthritis of carpometacarpal [facility]) joint of both thumbs 09/19/2019   Overactive bladder  09/19/2019   Sciatica 04/26/2019   Paresthesia of both feet 02/04/2019   Epidermal cyst 04/17/2018   Allergic rhinitis 06/23/2015   Eczema 06/23/2015   Gastro-esophageal reflux disease without esophagitis 06/23/2015   Essential hypertension 06/23/2015   Hypothyroidism 06/23/2015     REFERRING PROVIDER: Benancio Deeds, MD   REFERRING DIAG:  S82.842A (ICD-10-CM) - Bimalleolar ankle fracture, left, closed, initial encounter     S/p ORIF L ankle bimalleolar fx  Rationale for Evaluation and Treatment: Rehabilitation  THERAPY DIAG:  No diagnosis found.  ONSET DATE: DOS 06/08/23  SUBJECTIVE:  SUBJECTIVE STATEMENT: Doing ok, no new issues.   PERTINENT HISTORY:  History of mild cognitive impairment, paresthesia of both feet and sciatica in history.  Patient reports memory limitations  PAIN:  Are you having pain? No 0/10  tender to touch  PRECAUTIONS:  None-ok to wean out of cam per MD note 07/19/23  RED FLAGS: None   WEIGHT BEARING RESTRICTIONS:  Yes WBAT with boot  FALLS:  Has patient fallen in last 6 months? Yes. Number of falls 1-this was the cause of the injury  LIVING ENVIRONMENT: Lives with: lives with their family and lives with their spouse   OCCUPATION:  Retired  PLOF:  Independent  PATIENT GOALS:  Return to walking, gardening, and other prior level of function  OBJECTIVE:  Note: Objective measures were completed at Evaluation unless otherwise noted.  ROM  AROM  Left 08/28/23 Left 09/07/23 Left 09/14/23 09/21/23  Ankle DF  -10* 3 10 10   Ankle PF 43* 50 55 56  Ankle inversion  12* 21 35 38  Ankle eversion 9* 12 25 27       MMT  Left  08/28/23  Ankle DF 5  Ankle PF  1  Ankle inversion  3  Ankle eversion  3     FUNCTIONAL TESTS 08/28/23  SLS 3 seconds L,  10 seconds R TUG 9.5 seconds no device    TREATMENT:    09/25/23 Bike L4x67mins  HS curls 25lb 2x12 Leg Ext 10lb 2x12 4in box on airex forward and lateral step ups x10 each Slant board calf stretch Sit to stand LE on airex In // bars tandem walking and side steps 4in step downs  09/21/23 NuStep L4 x 6 min L ankle PROM w/ end range holds  L anke Jt Mobs Gait outside to back building up and down stairs one rail L alt pattern then back down out door up hill  L ankle eccentric load weakness when descending stairs "It gives" 8in step ups from airex forward and lateral x 10 each HS curls 25lb 2x12 Leg Ext 5lb 2x12 Heel raises black bar 2x15  L ankle green Tband 2x10 4 way  09/18/23 Bike L4x45mins  Treadmill pushes  Step ups on BOSU Heel raises 2x12 Resisted gait 20# 4 way x4  Step ups from airex   Standing on dyna disk balance, SL on disk Rocker board forward/back and side to side  Trampoline jumps double and single leg x10   09/14/23 Bike L 3 x 6 min L ankle PROM w/ end range holds  L anke Jt Mobs 4 way ankle Tband green 2x10 Sit to stand on airex 2x10 On airex alt Cone taps 2x10  Side steps in ans out upside down 6in box   Then upside box on airex Standing on dyna disk reaching outside base of support in // bars  Heel raises black bar 8in step ups x10 each  09/11/23 NuStep L4 x 6 min L ankle PROM w/ end range holds  L anke Jt Mobs L ankle Tband green x12 each Sit to stand on airex 2x10 4in box on airex step ups x 10 each 20lb resisted side steps x5 each Heel raises form floor 2x10  Leg press 40lb 2x10, Heel Raises 40lb 2x10 HS curls 25lb 2x10 Leg Ext 5lb 2x10   09/07/23 L ankle PROM w/ end range holds  L anke Jt Mobs NuStep L4 x 6 min LE only Stair negotiation 4 & 6 in alt pattern 1 rail. Mods cues for sequencing  4in step downs Resisted gait 30lb 4 way x 3 each 8in lateral step ups form airex x10 each  Leg press 30lb 2x10, Heel Raises 30lb  2x12   09/04/23 L ankle PROM w/ end range holds  L anke Jt Mobs L ankel Green Tband 4 way x 15 each NuStep L4 x 6 min LE only Tmill pushes x20, 2x10 Slant board calf stretch 6in lateral step ups  20lb resistd side step over WaTE bar x5 each 4in step downs LLE stretch 2x10 HS curls 25lb 2x10 Leg Ext 5lb 2x10   08/31/23  Nustep L5 x8 minutes all four extremities for w/u, tissue perfusion prior to exercise  Gastroc stretches on black bar 2x30 seconds  Slant board gastroc stretch 2x30 seconds  Plantar fascia stretch on 2x30 seconds off step  Heel raises on slant board x10 Lunges on 12 inch box with OP into ankle DF surgical LE x10    Double stair taps x10 B standing on blue foam pad  3 way taps on blue foam pad- tap to step, lateral, retro x10 B  Side steps on blue foam with target taps x4 rounds   Side steps stepping over targets x4 rounds blue foam  Forward walking over blue foam stepping over targets x4 laps     08/28/23  Nustep L5 x8 minutes for w/u and tissue perfusion prior to testing Tandem stance on blue foam 3x30 seconds B  FOTO, objectives as above, goal check, education on progress thus far and potential benefit of continuing with PT but likely DC to advanced HEP at end of new cert period      08/24/23 Bike L3 x55mins  Step up on 6" step from airex forward and lateral Calf stretch 15 x4 (2x on lower level and then 2x on higher slant)  Resisted gait 20# 4 way x4  Calf raises 2x15 Toe raises 2x15 Forward lunges on to step Tandem stance on beam SLS on beam Small jumps on trampoline  STS on airex 2x5   08/21/23 Bike L 2.3 x 7 min Slant Board Calf stretch 4x15'' 20lb resisted gait 4 way x 3 each Heel raises 2x15  6in lateral step ups x10 each  L ankle PROM with end range holds, L ankle Jt mobs. 4 way ankle green 2x10 4in step downs Tmill pushes x20  08/17/23  Scifit bike L4x8 minutes for w/u and tissue perfusion Rocker board AP rocks x2 minutes  for ROM Inversion/eversion on blue air pad x20 Ankle PF/DF x20 on blue air pad x20 Forward lunges onto soft surface of bosu x12 B SLS with one foot on soft surface of bosu 3x30 seconds B   Gastroc stetches 3x30 seconds surgical LE Plantar fascia stretches 3x30 seconds B  Ankle 4 way strengthening black band x12 each    08/14/23 Bike L4 x84mins  Calf stretches on slant 15s x3 Rocker board DF/PF x20 Seated ankle TB green 4 way x4  Standing on airex SLS 10s with UE support  Joint mobs in L foot and ankle, passive, gentle stretch into DF Step ups 4"  Walking on beam   08/10/23  Nustep L5x8 minutes all four extremities a little closer today for dorsiflexion stretch on surgical ankle Forward lunges on 12 inch step with OP from PT anterior side of joint for ankle DF ROM x10 (to tolerance) Rocker board AP x15 for PF/DF ROM assist from RLE PRN Seated BAPS CCW and CW x10 each way L LE    Seated ankle  alphabet x3 Heel raises double leg x15  Toe raises double leg x15   Scar massage/mobility Desensitization techniques for surgical ankle as tolerated Retrograde massage for edema     08/07/23  Nustep L5 x8 minutes all four extremities Standing ankle PF/DF x15 on BAPs with other leg assisting Tandem stance blue foam pad 2x30 seconds B Tandem gait in // bars 3 laps on foam    Seated ankle circles on BAPs x10 CW/CCW Seated ankle PF into blue TB 12x2 second holds cues for eccentric control  Seated ankle DF into blue TB 12x2 second holds cues for eccentric control  Heel raises in // bars x10 (double leg) Toe raises in // bars x10 (double leg) Double heel up, single (L) down x10 BUE support on bars Seated ankle alphabets x2   Education on desensitization of post-op ankle- use materials of different textures and alternate rubbing each one over sore spots when watching TV at night, continued to encourage elevation/ice        PATIENT EDUCATION:  Education details:  Modified  HEP Person educated: Patient and Spouse Education method: Explanation, Facilities manager, Verbal cues Education comprehension:  verbalized understanding, returned demonstration, verbal cues required, and needs further education  HOME EXERCISE PROGRAM: [id number]  ASSESSMENT:  CLINICAL IMPRESSION: Again pt arrives today feeling well and reporting improvement overall. She reports no functional limitations at home although she is really careful. All interventions completed well, no pain reported but pt did stated she cold feel it during step ups. Some UE assist needed with tandem walking. Pt is pleased with her current functional status.   GOALS: Goals reviewed with patient? Yes  SHORT TERM GOALS: Target date: 09/11/2023     Ankle active dorsiflexion to 5 degrees Baseline: Shy of 0 at evaluation Goal status: 08/28/23-  ONGOING, -10* today, Met 09/14/23  2.  Able to stand with full weightbearing through foot pain less than or equal to 3 out of 10 Baseline: Unable to evaluation Goal status: 08/28/23 MET     LONG TERM GOALS: Target date: 09/25/2023    Meet Foto goal Baseline: See objective Goal status: 08/28/23- 44 (predicted 61)  2.  Able to demonstrate proper ambulation pattern without pain greater than 3 out of 10 for household distances Baseline:  Goal status: 09/21/23- PARTIALLY MET gait mechanics still not 100%   3.  Ankle dorsiflexion to 10 degrees Baseline:  Goal status: 09/07/23 ONGOING,  Met 09/14/23  4.  Able to navigate stairs ascending and descending without compensation Baseline:  Goal status: 09/21/23- L ankle weakness when descending PLAN:  PT FREQUENCY: 1-2x/week  PT DURATION: 5 weeks (backdated for continous cert coverage)  PLANNED INTERVENTIONS: 16109- PT Re-evaluation, 97110-Therapeutic exercises, 97530- Therapeutic activity, 97112- Neuromuscular re-education, 97535- Self Care, 60454- Manual therapy, [id number]- Gait training, [id number]- Aquatic Therapy, 97014-  Electrical stimulation (unattended), 97016- Vasopneumatic device, Patient/Family education, Balance training, Stair training, Taping, Dry Needling, Joint mobilization, Spinal mobilization, Scar mobilization, Cryotherapy, and Moist heat.  PLAN FOR NEXT SESSION:  D/C PT  Next MD visit 11/24/23  PHYSICAL THERAPY DISCHARGE SUMMARY  Visits from Start of Care: 25  Patient agrees to discharge. Patient goals were partially met. Patient is being discharged due to being pleased with the current functional level.   Debroah Baller, PTA 09/25/23 10:16 AM

## 2023-09-28 ENCOUNTER — Ambulatory Visit: Payer: Medicare Other

## 2023-10-06 ENCOUNTER — Ambulatory Visit (INDEPENDENT_AMBULATORY_CARE_PROVIDER_SITE_OTHER): Payer: Medicare Other | Admitting: Family Medicine

## 2023-10-06 ENCOUNTER — Encounter: Payer: Self-pay | Admitting: Family Medicine

## 2023-10-06 VITALS — BP 124/78 | HR 74 | Temp 97.3°F | Ht 64.0 in | Wt 135.8 lb

## 2023-10-06 DIAGNOSIS — Z8781 Personal history of (healed) traumatic fracture: Secondary | ICD-10-CM | POA: Diagnosis not present

## 2023-10-06 DIAGNOSIS — Z9889 Other specified postprocedural states: Secondary | ICD-10-CM | POA: Insufficient documentation

## 2023-10-06 DIAGNOSIS — M19072 Primary osteoarthritis, left ankle and foot: Secondary | ICD-10-CM | POA: Insufficient documentation

## 2023-10-06 DIAGNOSIS — E039 Hypothyroidism, unspecified: Secondary | ICD-10-CM

## 2023-10-06 DIAGNOSIS — E782 Mixed hyperlipidemia: Secondary | ICD-10-CM | POA: Diagnosis not present

## 2023-10-06 DIAGNOSIS — I10 Essential (primary) hypertension: Secondary | ICD-10-CM | POA: Diagnosis not present

## 2023-10-06 NOTE — Assessment & Plan Note (Signed)
Lipids are at goal. Continue atorvastatin 20 mg daily. 

## 2023-10-06 NOTE — Assessment & Plan Note (Signed)
 Blood pressure is in good control. Continue losartan 25 mg daily.

## 2023-10-06 NOTE — Assessment & Plan Note (Signed)
 Pain appears to be some arthritis of the 1st tarsometatarsal joint. I recommend she try relative rest and massage with Volatren gel to see if this improves. She should review this with Dr. Steward Drone at her upcoming appointment.

## 2023-10-06 NOTE — Progress Notes (Signed)
 [facility] [address] [place] [address] Dept: [phone] Dept Fax: [phone]  Chronic Care Office Visit  Subjective:    Patient ID: Shelly Pearson, female    DOB: 1941/02/19, 83 y.o..   MRN: [id number]  Chief Complaint  Patient presents with   Hypertension    6 month f/u HTN.     History of Present Illness:  Patient is in today for reassessment of chronic medical issues.  Ms. Pippen has essential hypertension. She is managed on losartan 25 mg daily.   Ms. Legore has a history of hyperlipidemia. She is managed on atorvastatin 20 mg daily.   Ms. Scheffler has a history of hypothyroidism. She is managed on levothyroxine 50 mcg daily.   Ms. Escutia suffered a bimalleolar fracture of her left ankle on 12/3. She had ORIF and has now completed physical therapy. She notes she has been having some pain more recently in the left foot.  Past Medical History: Patient Active Problem List   Diagnosis Date Noted   Status post open reduction with internal fixation (ORIF) of fracture of ankle- Left 10/06/2023   Arthritis of midtarsal joint of left foot 10/06/2023   Exudative age-related macular degeneration, right eye, with active choroidal neovascularization (HCC) 05/24/2022   Decreased grip strength of right hand 09/16/2021   Gait disturbance 07/12/2021   Mild cognitive impairment 07/12/2021   Thyroid goiter 12/10/2020   Mixed hyperlipidemia 12/10/2020   Degenerative cervical spinal stenosis, mild C4-C5 and C5-C6 12/10/2020   Lumbar degenerative disc disease, L4-L5 with left lateral recess stenosis 12/10/2020   Personal history of fall 09/01/2020   Congenital cavus deformity of both feet 03/02/2020   Metatarsalgia of both feet 03/02/2020   Hand arthritis 03/02/2020   Arthritis of carpometacarpal (CMC) joint of both thumbs 09/19/2019   Overactive bladder 09/19/2019   Sciatica 04/26/2019   Paresthesia of both  feet 02/04/2019   Epidermal cyst 04/17/2018   Allergic rhinitis 06/23/2015   Eczema 06/23/2015   Gastro-esophageal reflux disease without esophagitis 06/23/2015   Essential hypertension 06/23/2015   Hypothyroidism 06/23/2015   Past Surgical History:  Procedure Laterality Date   ABDOMINAL HYSTERECTOMY     CESAREAN SECTION     CYSTOCELE REPAIR     ORIF ANKLE FRACTURE Left 06/08/2023   Procedure: LEFT OPEN REDUCTION INTERNAL FIXATION (ORIF) ANKLE FRACTURE;  Surgeon: Huel Cote, MD;  Location: [facility] ORS;  Service: Orthopedics;  Laterality: Left;   ROTATOR CUFF REPAIR Right    2018   Family History  Problem Relation Age of Onset   Stroke Mother    Cancer Father    Cancer Sister    Cancer Brother    Outpatient Medications Prior to Visit  Medication Sig Dispense Refill   atorvastatin (LIPITOR) 20 MG tablet TAKE 1 TABLET DAILY 90 tablet 3   Biotin 10 MG CAPS Take 10 mg by mouth daily.     levothyroxine (SYNTHROID) 50 MCG tablet TAKE 1 TABLET DAILY BEFORE BREAKFAST 90 tablet 3   losartan (COZAAR) 25 MG tablet TAKE 1 TABLET DAILY 90 tablet 3   Multiple Vitamins-Minerals (MATURE ADULT CENTURY PO) Take 1 tablet by mouth daily.     Polyethyl Glycol-Propyl Glycol (SYSTANE OP) Place 2 drops into both eyes 3 (three) times daily.     triamcinolone cream (KENALOG) 0.1 % Apply topically.     trospium (SANCTURA) 20 MG tablet TAKE 1 TABLET AT BEDTIME 90 tablet 3   aspirin EC 325 MG tablet Take  1 tablet (325 mg total) by mouth daily. 14 tablet 0   calcium carbonate (TUMS - DOSED IN MG ELEMENTAL CALCIUM) 500 MG chewable tablet Chew 1 tablet by mouth daily as needed for indigestion or heartburn.     ibuprofen (ADVIL) 200 MG tablet Take 600 mg by mouth every 8 (eight) hours as needed for moderate pain (pain score 4-6).     No facility-administered medications prior to visit.   Allergies  Allergen Reactions   Adhesive  [Tape] Rash   Objective:   Today's Vitals   10/06/23 0850  BP: 124/78   Pulse: 74  Temp: (!) 97.3 F (36.3 C)  TempSrc: Temporal  SpO2: 99%  Weight: 135 lb 12.8 oz (61.6 kg)  Height: 5\' 4"  (1.626 m)   Body mass index is 23.31 kg/m.   General: Well developed, well nourished. No acute distress. Extremities: Surgical wound of the left lateral ankle is well healed. No ankle swelling noted. There is some   bony prominence of the 1st tarsometatarsal joint/medial cuneiform. No increased warmth or tenderness t  to palpation. Psych: Alert and oriented. Normal mood and affect.  Health Maintenance Due  Topic Date Due   Zoster Vaccines- Shingrix (2 of 2) 05/06/2022   Medicare Annual Wellness (AWV)  04/20/2023    Assessment & Plan:   Problem List Items Addressed This Visit       Cardiovascular and Mediastinum   Essential hypertension - Primary   Blood pressure is in good control. Continue losartan 25 mg daily.        Endocrine   Hypothyroidism   TSH is at goal. Continue levothyroxine 50 mcg daily.        Musculoskeletal and Integument   Arthritis of midtarsal joint of left foot   Pain appears to be some arthritis of the 1st tarsometatarsal joint. I recommend she try relative rest and massage with Volatren gel to see if this improves. She should review this with Dr. Steward Drone at her upcoming appointment.        Other   Mixed hyperlipidemia   Lipids are at goal. Continue atorvastatin 20 mg daily.      Status post open reduction with internal fixation (ORIF) of fracture of ankle- Left   X-rays from Feb. confirm good healing and hardware is in place.       Return in about 6 months (around 04/06/2024) for Reassessment.   Loyola Mast, MD

## 2023-10-06 NOTE — Assessment & Plan Note (Signed)
 TSH is at goal. Continue levothyroxine 50 mcg daily.

## 2023-10-06 NOTE — Assessment & Plan Note (Signed)
 X-rays from Feb. confirm good healing and hardware is in place.

## 2023-10-12 ENCOUNTER — Ambulatory Visit: Payer: Medicare Other | Admitting: Family Medicine

## 2023-11-20 ENCOUNTER — Ambulatory Visit (HOSPITAL_BASED_OUTPATIENT_CLINIC_OR_DEPARTMENT_OTHER): Payer: Medicare Other | Admitting: Orthopaedic Surgery

## 2023-11-23 ENCOUNTER — Ambulatory Visit (INDEPENDENT_AMBULATORY_CARE_PROVIDER_SITE_OTHER): Admitting: Nurse Practitioner

## 2023-11-23 ENCOUNTER — Encounter: Payer: Self-pay | Admitting: Nurse Practitioner

## 2023-11-23 VITALS — BP 126/78 | HR 78 | Temp 97.2°F | Ht 64.0 in | Wt 137.8 lb

## 2023-11-23 DIAGNOSIS — K644 Residual hemorrhoidal skin tags: Secondary | ICD-10-CM

## 2023-11-23 DIAGNOSIS — L304 Erythema intertrigo: Secondary | ICD-10-CM

## 2023-11-23 DIAGNOSIS — K5900 Constipation, unspecified: Secondary | ICD-10-CM

## 2023-11-23 DIAGNOSIS — N3946 Mixed incontinence: Secondary | ICD-10-CM | POA: Diagnosis not present

## 2023-11-23 LAB — POCT URINALYSIS DIPSTICK
Bilirubin, UA: NEGATIVE
Blood, UA: NEGATIVE
Glucose, UA: POSITIVE — AB
Ketones, UA: NEGATIVE
Nitrite, UA: NEGATIVE
Protein, UA: NEGATIVE
Spec Grav, UA: 1.01 (ref 1.010–1.025)
Urobilinogen, UA: NEGATIVE U/dL — AB
pH, UA: 6 (ref 5.0–8.0)

## 2023-11-23 MED ORDER — NYSTATIN 100000 UNIT/GM EX CREA: 1.0000 | TOPICAL_CREAM | Freq: Two times a day (BID) | CUTANEOUS | 0 refills | Status: DC

## 2023-11-23 MED ORDER — HYDROCORTISONE ACETATE 25 MG RE SUPP: 25.0000 mg | Freq: Two times a day (BID) | RECTAL | 0 refills | Status: DC

## 2023-11-23 NOTE — Progress Notes (Signed)
 Acute Office Visit  Subjective:    Patient ID: Shelly Pearson, female    DOB: February 13, 1941, 83 y.o.   MRN: [id number]  Chief Complaint  Patient presents with   Hemorrhoids    Last week and some difficulty with urinating    Constipation This is a new problem. The current episode started in the past 7 days. The problem is unchanged. Her stool frequency is 1 time per day. The stool is described as pellet like and blood coated. The patient is on a high fiber diet. She Does not exercise regularly. There has Been adequate water intake. Associated symptoms include anorexia, difficulty urinating, hemorrhoids and rectal pain. Pertinent negatives include no abdominal pain, back pain, bloating, diarrhea, fecal incontinence, fever, flatus, hematochezia, melena, nausea, vomiting or weight loss. Risk factors include dietary change. She has tried fiber for the symptoms. There is no history of abdominal surgery, endocrine disease, inflammatory bowel disease, irritable bowel syndrome, metabolic disease, neurologic disease, neuromuscular disease, psychiatric history or radiation treatment.   Outpatient Medications Prior to Visit  Medication Sig   atorvastatin  (LIPITOR) 20 MG tablet TAKE 1 TABLET DAILY   Biotin 10 MG CAPS Take 10 mg by mouth daily.   levothyroxine  (SYNTHROID ) 50 MCG tablet TAKE 1 TABLET DAILY BEFORE BREAKFAST   losartan  (COZAAR ) 25 MG tablet TAKE 1 TABLET DAILY   Multiple Vitamins-Minerals (MATURE ADULT CENTURY PO) Take 1 tablet by mouth daily.   Polyethyl Glycol-Propyl Glycol (SYSTANE OP) Place 2 drops into both eyes 3 (three) times daily.   triamcinolone  cream (KENALOG ) 0.1 % Apply topically.   trospium  (SANCTURA ) 20 MG tablet TAKE 1 TABLET AT BEDTIME   No facility-administered medications prior to visit.   Reviewed past medical and social history.  Review of Systems  Constitutional:  Negative for fever and weight loss.  Gastrointestinal:  Positive for anorexia, constipation,  hemorrhoids and rectal pain. Negative for abdominal pain, bloating, diarrhea, flatus, hematochezia, melena, nausea and vomiting.  Genitourinary:  Positive for difficulty urinating.  Musculoskeletal:  Negative for back pain.   Per HPI     Objective:    Physical Exam Vitals and nursing note reviewed. Exam conducted with a chaperone present.  Constitutional:      General: She is not in acute distress. Abdominal:     Palpations: Abdomen is soft.     Tenderness: There is no abdominal tenderness. There is no guarding.  Genitourinary:    Labia:        Right: Rash present.        Left: Rash present.      Rectum: External hemorrhoid present.  Neurological:     Mental Status: She is alert.    BP 126/78 (BP Location: Right Arm, Patient Position: Sitting, Cuff Size: Normal)   Pulse 78   Temp (!) 97.2 F (36.2 C) (Temporal)   Ht 5\' 4"  (1.626 m)   Wt 137 lb 12.8 oz (62.5 kg)   SpO2 96%   BMI 23.65 kg/m    No results found for any visits on 11/23/23.     Assessment & Plan:   Problem List Items Addressed This Visit   None Visit Diagnoses       Inflamed external hemorrhoid    -  Primary   Relevant Medications   hydrocortisone (ANUSOL-HC) 25 MG suppository     Mixed stress and urge urinary incontinence       Relevant Orders   POCT urinalysis dipstick     Constipation, unspecified constipation type  Pruritic intertrigo       Relevant Medications   nystatin  cream (MYCOSTATIN )      Meds ordered this encounter  Medications   hydrocortisone (ANUSOL-HC) 25 MG suppository    Sig: Place 1 suppository (25 mg total) rectally 2 (two) times daily.    Dispense:  12 suppository    Refill:  0    Supervising Provider:   KREMER, WILLIAM ALFRED [[id number]]   nystatin  cream (MYCOSTATIN )    Sig: Apply 1 Application topically 2 (two) times daily.    Dispense:  30 g    Refill:  0    Supervising Provider:   Christianna Cowman ALFRED [[id number]]   Start sitz baths Start miralax 17g daily for  constipation Start proctosol suppository for inflammed hemorrhoid Start nystatin  cream for rash between buttocks and perineal region. Call office if no improvement by Monday. Go to urgent care if symptoms get worse  Return if symptoms worsen or fail to improve.    Kathrene Parents, NP

## 2023-11-23 NOTE — Patient Instructions (Addendum)
 Start miralax 17g daily for constipation Start proctosol suppository for inflammed hemorrhoid Start nystatin  cream for rash between buttocks and perineal region. Call office if no improvement by Monday.  How to Take a Sitz Bath A sitz bath is a warm water bath that may be used to care for your rectum, genital area, or the area between your rectum and genitals (perineum). In a sitz bath, the water only comes up to your hips and covers your buttocks. A sitz bath may be done in a bathtub or with a portable sitz bath that fits over the toilet. Your health care provider may recommend a sitz bath to help: Relieve pain and discomfort after delivering a baby. Relieve pain and itching from hemorrhoids or anal fissures. Relieve pain after certain surgeries. Relax muscles that are sore or tight. How to take a sitz bath Take 2-4 sitz baths a day, or as many as told by your health care provider. Bathtub sitz bath To take a sitz bath in a bathtub: Partially fill a bathtub with warm water. The water should be deep enough to cover your hips and buttocks when you are sitting in the bathtub. Follow your health care provider's instructions if you are told to put medicine in the water. Sit in the water. Open the bathtub drain a little, and leave it open during your bath. Turn on the warm water again, enough to replace the water that is draining out. Keep the water running throughout your bath. This helps keep the water at the right level and temperature. Soak in the water for 15-20 minutes, or as long as told by your health care provider. When you are done, be careful when you stand up. You may feel dizzy. After the sitz bath, pat yourself dry. Do not rub your skin to dry it.  Over-the-toilet sitz bath To take a sitz bath with an over-the-toilet basin: Follow the manufacturer's instructions. Fill the basin with warm water. Follow your health care provider's instructions if you were told to put medicine in  the water. Sit on the seat. Make sure the water covers your buttocks and perineum. Soak in the water for 15-20 minutes, or as long as told by your health care provider. After the sitz bath, pat yourself dry. Do not rub your skin to dry it. Clean and dry the basin between uses. Discard the basin if it cracks, or according to the manufacturer's instructions.  Contact a health care provider if: Your pain or itching gets worse. Stop doing sitz baths if your symptoms get worse. You have new symptoms. Stop doing sitz baths until you talk with your health care provider. Summary A sitz bath is a warm water bath in which the water only comes up to your hips and covers your buttocks. Your health care provider may recommend a sitz bath to help relieve pain and discomfort after delivering a baby, relieve pain and itching from hemorrhoids or anal fissures, relieve pain after certain surgeries, or help to relax muscles that are sore or tight. Take 2-4 sitz baths a day, or as many as told by your health care provider. Soak in the water for 15-20 minutes. Stop doing sitz baths if your symptoms get worse. This information is not intended to replace advice given to you by your health care provider. Make sure you discuss any questions you have with your health care provider. Document Revised: 09/21/2021 Document Reviewed: 09/21/2021 Elsevier Patient Education  2024 [employer].

## 2023-11-24 ENCOUNTER — Ambulatory Visit (HOSPITAL_BASED_OUTPATIENT_CLINIC_OR_DEPARTMENT_OTHER): Payer: Medicare Other | Admitting: Orthopaedic Surgery

## 2023-11-24 ENCOUNTER — Ambulatory Visit (HOSPITAL_BASED_OUTPATIENT_CLINIC_OR_DEPARTMENT_OTHER)

## 2023-11-24 DIAGNOSIS — S8252XD Displaced fracture of medial malleolus of left tibia, subsequent encounter for closed fracture with routine healing: Secondary | ICD-10-CM | POA: Diagnosis not present

## 2023-11-24 DIAGNOSIS — S82842A Displaced bimalleolar fracture of left lower leg, initial encounter for closed fracture: Secondary | ICD-10-CM

## 2023-11-24 DIAGNOSIS — S82402D Unspecified fracture of shaft of left fibula, subsequent encounter for closed fracture with routine healing: Secondary | ICD-10-CM | POA: Diagnosis not present

## 2023-11-24 NOTE — H&P (View-Only) (Signed)
 Post Operative Evaluation    Procedure/Date of Surgery: Left ankle open reduction internal fixation 12/5  Interval History:    Presents today for follow-up status post right ankle surgery.  She is experiencing some tenderness particular shoewear particular about the medial aspect of the ankle near her medial malleolar screws   PMH/PSH/Family History/Social History/Meds/Allergies:    Past Medical History:  Diagnosis Date   B12 deficiency 03/02/2020   GERD (gastroesophageal reflux disease)    Hypertension    Seasonal allergies    Thyroid  disease    Urge incontinence of urine 12/10/2020   Urgency incontinence    Past Surgical History:  Procedure Laterality Date   ABDOMINAL HYSTERECTOMY     CESAREAN SECTION     CYSTOCELE REPAIR     ORIF ANKLE FRACTURE Left 06/08/2023   Procedure: LEFT OPEN REDUCTION INTERNAL FIXATION (ORIF) ANKLE FRACTURE;  Surgeon: Wilhelmenia Harada, MD;  Location: [facility] ORS;  Service: Orthopedics;  Laterality: Left;   ROTATOR CUFF REPAIR Right    2018   Social History   Socioeconomic History   Marital status: Married    Spouse name: Not on file   Number of children: Not on file   Years of education: Not on file   Highest education level: Bachelor's degree (e.g., BA, AB, BS)  Occupational History   Occupation: Retired  Tobacco Use   Smoking status: Never   Smokeless tobacco: Never  Vaping Use   Vaping status: Never Used  Substance and Sexual Activity   Alcohol use: Yes    Comment: very rare    Drug use: No   Sexual activity: Not Currently  Other Topics Concern   Not on file  Social History Narrative   Lives with husband   Right handed   1 cup tea every morning   Social Drivers of Health   Financial Resource Strain: Low Risk  (10/02/2023)   Overall Financial Resource Strain (CARDIA)    Difficulty of Paying Living Expenses: Not hard at all  Food Insecurity: No Food Insecurity (10/02/2023)   Hunger Vital Sign     Worried About Running Out of Food in the Last Year: Never true    Ran Out of Food in the Last Year: Never true  Transportation Needs: No Transportation Needs (10/02/2023)   PRAPARE - Administrator, Civil Service (Medical): No    Lack of Transportation (Non-Medical): No  Physical Activity: Unknown (10/02/2023)   Exercise Vital Sign    Days of Exercise per Week: Patient declined    Minutes of Exercise per Session: Not on file  Stress: Stress Concern Present (10/02/2023)   [employer] of Occupational Health - Occupational Stress Questionnaire    Feeling of Stress : To some extent  Social Connections: Socially Integrated (10/02/2023)   Social Connection and Isolation Panel [NHANES]    Frequency of Communication with Friends and Family: More than three times a week    Frequency of Social Gatherings with Friends and Family: Three times a week    Attends Religious Services: More than 4 times per year    Active Member of Clubs or Organizations: Yes    Attends Banker Meetings: More than 4 times per year    Marital Status: Married   Family History  Problem Relation Age of Onset   Stroke  Mother    Cancer Father    Cancer Sister    Cancer Brother    Allergies  Allergen Reactions   Adhesive  [Tape] Rash   Current Outpatient Medications  Medication Sig Dispense Refill   atorvastatin  (LIPITOR) 20 MG tablet TAKE 1 TABLET DAILY 90 tablet 3   Biotin 10 MG CAPS Take 10 mg by mouth daily.     hydrocortisone (ANUSOL-HC) 25 MG suppository Place 1 suppository (25 mg total) rectally 2 (two) times daily. 12 suppository 0   levothyroxine  (SYNTHROID ) 50 MCG tablet TAKE 1 TABLET DAILY BEFORE BREAKFAST 90 tablet 3   losartan  (COZAAR ) 25 MG tablet TAKE 1 TABLET DAILY 90 tablet 3   Multiple Vitamins-Minerals (MATURE ADULT CENTURY PO) Take 1 tablet by mouth daily.     nystatin  cream (MYCOSTATIN ) Apply 1 Application topically 2 (two) times daily. 30 g 0   Polyethyl Glycol-Propyl  Glycol (SYSTANE OP) Place 2 drops into both eyes 3 (three) times daily.     triamcinolone  cream (KENALOG ) 0.1 % Apply topically.     trospium  (SANCTURA ) 20 MG tablet TAKE 1 TABLET AT BEDTIME 90 tablet 3   No current facility-administered medications for this visit.   No results found.  Review of Systems:   A ROS was performed including pertinent positives and negatives as documented in the HPI.   Musculoskeletal Exam:    There were no vitals taken for this visit.  Left ankle incisions are well-appearing without erythema or drainage.  There is still trace swelling about the ankle.  Range of motion is 10 degrees dorsi and plantarflexion.  Neurosensory exam is intact  Imaging:    3 views left ankle: Status post open reduction internal fixation overall doing well without complication, there is interval healing of her fracture  I personally reviewed and interpreted the radiographs.   Assessment:   6 months status post left ankle open reduction internal fixation.  She is somewhat symptomatic about her medial malleolar screws.  She is also having symptoms about her peroneal tendon insertions at the plantar aspect of the foot as well as issues with neuropathy which she is having on the other side as well.  Given this I do believe she would benefit from a custom orthotic.  I will plan to see her back as needed should she want to consider medial malleolar screw removal  Plan :    -Return to clinic as needed for screw removal    I personally saw and evaluated the patient, and participated in the management and treatment plan.  Wilhelmenia Harada, MD Attending Physician, Orthopedic Surgery  This document was dictated using Dragon voice recognition software. A reasonable attempt at proof reading has been made to minimize errors.

## 2023-11-24 NOTE — Progress Notes (Signed)
 Post Operative Evaluation    Procedure/Date of Surgery: Left ankle open reduction internal fixation 12/5  Interval History:    Presents today for follow-up status post right ankle surgery.  She is experiencing some tenderness particular shoewear particular about the medial aspect of the ankle near her medial malleolar screws   PMH/PSH/Family History/Social History/Meds/Allergies:    Past Medical History:  Diagnosis Date   B12 deficiency 03/02/2020   GERD (gastroesophageal reflux disease)    Hypertension    Seasonal allergies    Thyroid  disease    Urge incontinence of urine 12/10/2020   Urgency incontinence    Past Surgical History:  Procedure Laterality Date   ABDOMINAL HYSTERECTOMY     CESAREAN SECTION     CYSTOCELE REPAIR     ORIF ANKLE FRACTURE Left 06/08/2023   Procedure: LEFT OPEN REDUCTION INTERNAL FIXATION (ORIF) ANKLE FRACTURE;  Surgeon: Wilhelmenia Harada, MD;  Location: [facility] ORS;  Service: Orthopedics;  Laterality: Left;   ROTATOR CUFF REPAIR Right    2018   Social History   Socioeconomic History   Marital status: Married    Spouse name: Not on file   Number of children: Not on file   Years of education: Not on file   Highest education level: Bachelor's degree (e.g., BA, AB, BS)  Occupational History   Occupation: Retired  Tobacco Use   Smoking status: Never   Smokeless tobacco: Never  Vaping Use   Vaping status: Never Used  Substance and Sexual Activity   Alcohol use: Yes    Comment: very rare    Drug use: No   Sexual activity: Not Currently  Other Topics Concern   Not on file  Social History Narrative   Lives with husband   Right handed   1 cup tea every morning   Social Drivers of Health   Financial Resource Strain: Low Risk  (10/02/2023)   Overall Financial Resource Strain (CARDIA)    Difficulty of Paying Living Expenses: Not hard at all  Food Insecurity: No Food Insecurity (10/02/2023)   Hunger Vital Sign     Worried About Running Out of Food in the Last Year: Never true    Ran Out of Food in the Last Year: Never true  Transportation Needs: No Transportation Needs (10/02/2023)   PRAPARE - Administrator, Civil Service (Medical): No    Lack of Transportation (Non-Medical): No  Physical Activity: Unknown (10/02/2023)   Exercise Vital Sign    Days of Exercise per Week: Patient declined    Minutes of Exercise per Session: Not on file  Stress: Stress Concern Present (10/02/2023)   [employer] of Occupational Health - Occupational Stress Questionnaire    Feeling of Stress : To some extent  Social Connections: Socially Integrated (10/02/2023)   Social Connection and Isolation Panel [NHANES]    Frequency of Communication with Friends and Family: More than three times a week    Frequency of Social Gatherings with Friends and Family: Three times a week    Attends Religious Services: More than 4 times per year    Active Member of Clubs or Organizations: Yes    Attends Banker Meetings: More than 4 times per year    Marital Status: Married   Family History  Problem Relation Age of Onset   Stroke  Mother    Cancer Father    Cancer Sister    Cancer Brother    Allergies  Allergen Reactions   Adhesive  [Tape] Rash   Current Outpatient Medications  Medication Sig Dispense Refill   atorvastatin  (LIPITOR) 20 MG tablet TAKE 1 TABLET DAILY 90 tablet 3   Biotin 10 MG CAPS Take 10 mg by mouth daily.     hydrocortisone (ANUSOL-HC) 25 MG suppository Place 1 suppository (25 mg total) rectally 2 (two) times daily. 12 suppository 0   levothyroxine  (SYNTHROID ) 50 MCG tablet TAKE 1 TABLET DAILY BEFORE BREAKFAST 90 tablet 3   losartan  (COZAAR ) 25 MG tablet TAKE 1 TABLET DAILY 90 tablet 3   Multiple Vitamins-Minerals (MATURE ADULT CENTURY PO) Take 1 tablet by mouth daily.     nystatin  cream (MYCOSTATIN ) Apply 1 Application topically 2 (two) times daily. 30 g 0   Polyethyl Glycol-Propyl  Glycol (SYSTANE OP) Place 2 drops into both eyes 3 (three) times daily.     triamcinolone  cream (KENALOG ) 0.1 % Apply topically.     trospium  (SANCTURA ) 20 MG tablet TAKE 1 TABLET AT BEDTIME 90 tablet 3   No current facility-administered medications for this visit.   No results found.  Review of Systems:   A ROS was performed including pertinent positives and negatives as documented in the HPI.   Musculoskeletal Exam:    There were no vitals taken for this visit.  Left ankle incisions are well-appearing without erythema or drainage.  There is still trace swelling about the ankle.  Range of motion is 10 degrees dorsi and plantarflexion.  Neurosensory exam is intact  Imaging:    3 views left ankle: Status post open reduction internal fixation overall doing well without complication, there is interval healing of her fracture  I personally reviewed and interpreted the radiographs.   Assessment:   6 months status post left ankle open reduction internal fixation.  She is somewhat symptomatic about her medial malleolar screws.  She is also having symptoms about her peroneal tendon insertions at the plantar aspect of the foot as well as issues with neuropathy which she is having on the other side as well.  Given this I do believe she would benefit from a custom orthotic.  I will plan to see her back as needed should she want to consider medial malleolar screw removal  Plan :    -Return to clinic as needed for screw removal    I personally saw and evaluated the patient, and participated in the management and treatment plan.  Wilhelmenia Harada, MD Attending Physician, Orthopedic Surgery  This document was dictated using Dragon voice recognition software. A reasonable attempt at proof reading has been made to minimize errors.

## 2023-11-25 LAB — URINE CULTURE
MICRO NUMBER:: 16489760
Result:: NO GROWTH
SPECIMEN QUALITY:: ADEQUATE

## 2023-11-26 ENCOUNTER — Ambulatory Visit: Payer: Self-pay | Admitting: Nurse Practitioner

## 2023-12-04 ENCOUNTER — Telehealth (HOSPITAL_BASED_OUTPATIENT_CLINIC_OR_DEPARTMENT_OTHER): Payer: Self-pay | Admitting: Orthopaedic Surgery

## 2023-12-04 DIAGNOSIS — H353211 Exudative age-related macular degeneration, right eye, with active choroidal neovascularization: Secondary | ICD-10-CM | POA: Diagnosis not present

## 2023-12-04 NOTE — Telephone Encounter (Signed)
 After visit question

## 2023-12-05 NOTE — Telephone Encounter (Signed)
 Surgery info sent

## 2023-12-08 ENCOUNTER — Telehealth: Payer: Self-pay | Admitting: Orthopaedic Surgery

## 2023-12-08 NOTE — Telephone Encounter (Signed)
 Patient called because she had left ankle surgery with Dr. Hermina Loosen in December 2024 and was seen 11/24/23 in the office at [facility]. Dr. Hermina Loosen discussed removing the screw in the ankle.   She would like proceed with scheduling the surgery.  Best number to reach patient is [phone]

## 2023-12-11 ENCOUNTER — Telehealth (HOSPITAL_BASED_OUTPATIENT_CLINIC_OR_DEPARTMENT_OTHER): Payer: Self-pay | Admitting: Orthopaedic Surgery

## 2023-12-11 DIAGNOSIS — Z23 Encounter for immunization: Secondary | ICD-10-CM | POA: Diagnosis not present

## 2023-12-11 NOTE — Telephone Encounter (Signed)
 Patient states that she not sure why she was referred to [facility] because she thought Dr B was Doing her surgery. I transferred her to April ext.

## 2023-12-14 ENCOUNTER — Encounter (HOSPITAL_BASED_OUTPATIENT_CLINIC_OR_DEPARTMENT_OTHER): Payer: Self-pay | Admitting: Orthopaedic Surgery

## 2023-12-14 NOTE — Progress Notes (Signed)
 Chief Complaint  Patient presents with   Follow-up    Pt alone, rm 10. States that she has noticed memory concerns. She states more noticeable. And its more frequent than usual    HISTORY OF PRESENT ILLNESS:  12/18/23 ALL:  Shelly Pearson returns for follow up for memory loss. She was last seen by me 09/2022 and we discussed starting a memory medication but she wished to continue monitoring. Since, she reports continued concerns with short term memory and recall. She tells me that her family is noticing it more. She had a fall 06/2023. She missed a step in her home and brake left ankle. She had a longer recovery than expected. She has not been as active since. She remains independent with ADLs. Eating normally. Sleeping well. She likes to read. She is followed by ophthalmology regularly for injections to treat macular degeneration. She drives without difficulty. She lives with her husband. They manage home together. She does most of the cooking.   09/05/2022 ALL:  Shelly Pearson returns for follow up for memory loss, gait instability and paraesthesias of hands and feet. Paresthesias are stable. No falls. Gait is stable.   She completed formal cognitive evaluation with Jenna Renfew. Results showed subtle indications of mild memory decline particularly evident in her delayed recall compared to immediate recall. Spontaneous recall showed slight deficit. Slight reduction in executive skills particularly in complex attention, fluency, and abstract problem solving. Deficits could be contributed to sleep deficiency and anxiety, both noted with clinical observation, or could represent early signs of neurodegenerative disease.   She was scheduled to follow up with Dr Clarine Cromer in January but had to cancel due to the loss of her sister and several deaths in her church. She continues to note difficulty with word finding and recall. She feels symptoms are stable. No significant worsening. She does have significant family history  of dementia.    01/11/2022 ALL: Shelly Pearson is a 83 y.o. female here today for follow up for memory loss, gait instability and paresthesias in hands and feet. She was seen in consult with Dr Gracie Lav 07/2021. MOCA 24/30. MRI brain and thoracic spine unremarkable.   She reports feeling that there is a rock at the base of her right toes. It can be painful. She wears tennis shoes for extra support. She also has intermittent numbness of toes bilaterally. She has stiffness in feet when waking. She has chronic lumbar back pain. No radicular symptoms. MRI showed degenerative disease without nerve root compression. No recent falls.   She feels memory is fairly stable. She does continue to have difficulty with short term memory. She lives with her husband. She is able to maintain home. Husband worked as an Airline pilot and has always paid bills. She performs ADLs independently. She drives without difficulty but does mention using GPS. She has a significant family history of dementia with grandmother and two aunts. She walks about an hour every day.    HISTORY (copied from Dr Torrance Freestone previous note)  Shelly Pearson is a 83 year old female, seen in request by her primary care physician Dr.  Remo Carls for evaluation of gait abnormality, was seen by Dr. Tilda Fogo since September 2021   I reviewed and summarized the referring note.PMHX HLD HTN Hypothyrodism   She reported many falls of the past few years, broke her ankle, she has now moved to be closer to her daughter since January 2020, due to increased difficulty living alone,  She now still independent, but has  learned to be careful, she is sometimes noticed intermittent bilateral hands and the toes paresthesia, dropped things from her hands, tightness difficulty moving her legs  She still walks regularly, 3 miles each day, but has to be careful, moves slowly She was seen by Dr. Tilda Fogo in the past,   I personally reviewed MRI lumbar in March 2022, mild  degenerative disease, no significant canal stenosis, moderate severe left lateral recess stenosis at L4-5, no significant canal stenosis.  MRI of cervical spine, multilevel degenerative changes, no nerve root compression, no significant canal stenosis  EMG nerve conduction study in March 2022 showed no evidence of large fiber peripheral neuropathy   She also complains of mild memory loss, MoCA examination 24/30 today   She reported a long history of urinary urgency, incontinence,   REVIEW OF SYSTEMS: Out of a complete 14 system review of symptoms, the patient complains only of the following symptoms, right foot pain, short term memory loss and all other reviewed systems are negative.   ALLERGIES: Allergies  Allergen Reactions   Adhesive  [Tape] Rash     HOME MEDICATIONS: Outpatient Medications Prior to Visit  Medication Sig Dispense Refill   atorvastatin  (LIPITOR) 20 MG tablet TAKE 1 TABLET DAILY 90 tablet 3   Biotin 10 MG CAPS Take 10 mg by mouth daily.     levothyroxine  (SYNTHROID ) 50 MCG tablet TAKE 1 TABLET DAILY BEFORE BREAKFAST 90 tablet 3   losartan  (COZAAR ) 25 MG tablet TAKE 1 TABLET DAILY 90 tablet 3   Multiple Vitamins-Minerals (MATURE ADULT CENTURY PO) Take 1 tablet by mouth daily.     Polyethyl Glycol-Propyl Glycol (SYSTANE OP) Place 2 drops into both eyes 3 (three) times daily.     trospium  (SANCTURA ) 20 MG tablet TAKE 1 TABLET AT BEDTIME 90 tablet 3   hydrocortisone  (ANUSOL -HC) 25 MG suppository Place 1 suppository (25 mg total) rectally 2 (two) times daily. 12 suppository 0   nystatin  cream (MYCOSTATIN ) Apply 1 Application topically 2 (two) times daily. 30 g 0   triamcinolone  cream (KENALOG ) 0.1 % Apply topically.     No facility-administered medications prior to visit.     PAST MEDICAL HISTORY: Past Medical History:  Diagnosis Date   B12 deficiency 03/02/2020   GERD (gastroesophageal reflux disease)    Hypertension    Seasonal allergies    Thyroid  disease     Urge incontinence of urine 12/10/2020   Urgency incontinence      PAST SURGICAL HISTORY: Past Surgical History:  Procedure Laterality Date   ABDOMINAL HYSTERECTOMY     CESAREAN SECTION     CYSTOCELE REPAIR     ORIF ANKLE FRACTURE Left 06/08/2023   Procedure: LEFT OPEN REDUCTION INTERNAL FIXATION (ORIF) ANKLE FRACTURE;  Surgeon: Wilhelmenia Harada, MD;  Location: [facility] ORS;  Service: Orthopedics;  Laterality: Left;   ROTATOR CUFF REPAIR Right    2018     FAMILY HISTORY: Family History  Problem Relation Age of Onset   Stroke Mother    Cancer Father    Cancer Sister    Cancer Brother      SOCIAL HISTORY: Social History   Socioeconomic History   Marital status: Married    Spouse name: Not on file   Number of children: Not on file   Years of education: Not on file   Highest education level: Bachelor's degree (e.g., BA, AB, BS)  Occupational History   Occupation: Retired  Tobacco Use   Smoking status: Never   Smokeless tobacco: Never  Vaping  Use   Vaping status: Never Used  Substance and Sexual Activity   Alcohol use: Yes    Comment: very rare    Drug use: No   Sexual activity: Not Currently  Other Topics Concern   Not on file  Social History Narrative   Lives with husband   Right handed   1 cup tea every morning   Social Drivers of Health   Financial Resource Strain: Low Risk  (10/02/2023)   Overall Financial Resource Strain (CARDIA)    Difficulty of Paying Living Expenses: Not hard at all  Food Insecurity: No Food Insecurity (10/02/2023)   Hunger Vital Sign    Worried About Running Out of Food in the Last Year: Never true    Ran Out of Food in the Last Year: Never true  Transportation Needs: No Transportation Needs (10/02/2023)   PRAPARE - Administrator, Civil Service (Medical): No    Lack of Transportation (Non-Medical): No  Physical Activity: Unknown (10/02/2023)   Exercise Vital Sign    Days of Exercise per Week: Patient declined    Minutes  of Exercise per Session: Not on file  Stress: Stress Concern Present (10/02/2023)   [employer] of Occupational Health - Occupational Stress Questionnaire    Feeling of Stress : To some extent  Social Connections: Socially Integrated (10/02/2023)   Social Connection and Isolation Panel    Frequency of Communication with Friends and Family: More than three times a week    Frequency of Social Gatherings with Friends and Family: Three times a week    Attends Religious Services: More than 4 times per year    Active Member of Clubs or Organizations: Yes    Attends Banker Meetings: More than 4 times per year    Marital Status: Married  Catering manager Violence: Not At Risk (04/19/2022)   Humiliation, Afraid, Rape, and Kick questionnaire    Fear of Current or Ex-Partner: No    Emotionally Abused: No    Physically Abused: No    Sexually Abused: No     PHYSICAL EXAM  Vitals:   12/18/23 1437  BP: 110/84  Pulse: 77  Weight: 135 lb (61.2 kg)  Height: 5' 4 (1.626 m)     Body mass index is 23.17 kg/m.  Generalized: Well developed, in no acute distress  Cardiology: normal rate and rhythm, no murmur auscultated  Respiratory: clear to auscultation bilaterally    Neurological examination  Mentation: Alert oriented to time, place, history taking. Follows all commands speech and language fluent Cranial nerve II-XII: Pupils were equal round reactive to light. Extraocular movements were full, visual field were full on confrontational test. Facial sensation and strength were normal. Head turning and shoulder shrug  were normal and symmetric. Motor: The motor testing reveals 5 over 5 strength of all 4 extremities. Good symmetric motor tone is noted throughout.  Sensory: Sensory testing is intact to soft touch on all 4 extremities. No evidence of extinction is noted.  Gait and station: Gait is normal.  Reflexes: Deep tendon reflexes are mildly brisk in uppers but symmetric  bilaterally.    DIAGNOSTIC DATA (LABS, IMAGING, TESTING) - I reviewed patient records, labs, notes, testing and imaging myself where available.  Lab Results  Component Value Date   WBC 5.5 06/08/2023   HGB 13.5 06/08/2023   HCT 40.8 06/08/2023   MCV 88.7 06/08/2023   PLT 194 06/08/2023      Component Value Date/Time   NA 144  04/07/2023 1102   K 3.9 04/07/2023 1102   CL 106 04/07/2023 1102   CO2 29 04/07/2023 1102   GLUCOSE 102 (H) 04/07/2023 1102   BUN 13 04/07/2023 1102   CREATININE 0.75 04/07/2023 1102   CALCIUM  9.6 04/07/2023 1102   PROT 6.4 03/02/2020 1443   ALBUMIN 4.3 03/02/2020 1443   AST 17 03/02/2020 1443   ALT 17 03/02/2020 1443   ALKPHOS 67 03/02/2020 1443   BILITOT 0.6 03/02/2020 1443   Lab Results  Component Value Date   CHOL 171 04/07/2023   HDL 69.70 04/07/2023   LDLCALC 79 04/07/2023   LDLDIRECT 70.0 03/02/2020   TRIG 111.0 04/07/2023   CHOLHDL 2 04/07/2023   No results found for: HGBA1C Lab Results  Component Value Date   VITAMINB12 422 09/01/2020   Lab Results  Component Value Date   TSH 0.82 04/07/2023        No data to display              12/18/2023    2:37 PM 09/05/2022   10:34 AM 01/11/2022   10:50 AM 07/17/2021   12:00 PM  Montreal Cognitive Assessment   Visuospatial/ Executive (0/5) 4 5 5 5   Naming (0/3) 3 3 3 3   Attention: Read list of digits (0/2) 2 2 2 2   Attention: Read list of letters (0/1) 1 1 1 1   Attention: Serial 7 subtraction starting at 100 (0/3) 3 3 3 2   Language: Repeat phrase (0/2) 2 2 1 2   Language : Fluency (0/1) 1 1 1 1   Abstraction (0/2) 2 2 1 2   Delayed Recall (0/5) 1 0 2 0  Orientation (0/6) 6 6 5 6   Total 25 25 24 24      ASSESSMENT AND PLAN  83 y.o. year old female  has a past medical history of B12 deficiency (03/02/2020), GERD (gastroesophageal reflux disease), Hypertension, Seasonal allergies, Thyroid  disease, Urge incontinence of urine (12/10/2020), and Urgency incontinence. here with     Mild cognitive impairment  Shelly Pearson reports more notable short term memory loss and difficulty with recall. MOCA stable, now 25/30, previously 25/30. MRI brian showed age related atrophy. Formal neurocog testing concerning for deficits related to anxiety and insomnia but could not rule out neurodegenerative concerns. She has significant family history of dementia. We will start low dose donepezil 5mg  at bedtime. Possible side effects reviewed. May increase to 10mg  daily if well tolerated. She will continue regular exercise. Memory compensation strategies reviewed. Healthy lifestyle habits encouraged. She will follow up with PCP as directed. She will return to see me in 6 months, sooner if needed. She verbalizes understanding and agreement with this plan.    No orders of the defined types were placed in this encounter.    Meds ordered this encounter  Medications   donepezil (ARICEPT) 5 MG tablet    Sig: Take 1 tablet (5 mg total) by mouth at bedtime.    Dispense:  90 tablet    Refill:  3    Patient will call when ready to fill, having surgery tomorrow    Supervising Provider:   AHERN, ANTONIA B [[id number]]    I spent 30 minutes of face-to-face and non-face-to-face time with patient.  This included previsit chart review, lab review, study review, order entry, electronic health record documentation, patient education.   Terrilyn Fick, MSN, FNP-C 12/18/2023, 3:14 PM  [facility] [address], [address] [place], [place] [address] [phone]

## 2023-12-14 NOTE — Patient Instructions (Signed)
 Below is our plan:  We will start a low dose of donepezil 5mg  at bedtime. Call pharmacy to fill after you recover from surgery. Call me if well tolerated and we can increase dose to 10mg  daily after about 4-6 weeks.   Please make sure you are staying well hydrated. I recommend 50-60 ounces daily. Well balanced diet and regular exercise encouraged. Consistent sleep schedule with 6-8 hours recommended.   Please continue follow up with care team as directed.   Follow up with me in 6 months   You may receive a survey regarding today's visit. I encourage you to leave honest feed back as I do use this information to improve patient care. Thank you for seeing me today!   Management of Memory Problems   There are some general things you can do to help manage your memory problems.  Your memory may not in fact recover, but by using techniques and strategies you will be able to manage your memory difficulties better.   1)  Establish a routine. Try to establish and then stick to a regular routine.  By doing this, you will get used to what to expect and you will reduce the need to rely on your memory.  Also, try to do things at the same time of day, such as taking your medication or checking your calendar first thing in the morning. Think about think that you can do as a part of a regular routine and make a list.  Then enter them into a daily planner to remind you.  This will help you establish a routine.   2)  Organize your environment. Organize your environment so that it is uncluttered.  Decrease visual stimulation.  Place everyday items such as keys or cell phone in the same place every day (ie.  Basket next to front door) Use post it notes with a brief message to yourself (ie. Turn off light, lock the door) Use labels to indicate where things go (ie. Which cupboards are for food, dishes, etc.) Keep a notepad and pen by the telephone to take messages   3)  Memory Aids A diary or  journal/notebook/daily planner Making a list (shopping list, chore list, to do list that needs to be done) Using an alarm as a reminder (kitchen timer or cell phone alarm) Using cell phone to store information (Notes, Calendar, Reminders) Calendar/White board placed in a prominent position Post-it notes   In order for memory aids to be useful, you need to have good habits.  It's no good remembering to make a note in your journal if you don't remember to look in it.  Try setting aside a certain time of day to look in journal.   4)  Improving mood and managing fatigue. There may be other factors that contribute to memory difficulties.  Factors, such as anxiety, depression and tiredness can affect memory. Regular gentle exercise can help improve your mood and give you more energy. Exercise: there are short videos created by the [employer] on Health specially for older adults: https://bit.ly/2I30q97.  Mediterranean diet: which emphasizes fruits, vegetables, whole grains, legumes, fish, and other seafood; unsaturated fats such as olive oils; and low amounts of red meat, eggs, and sweets. A variation of this, called MIND (Mediterranean-DASH Intervention for Neurodegenerative Delay) incorporates the DASH (Dietary Approaches to Stop Hypertension) diet, which has been shown to lower high blood pressure, a risk factor for Alzheimer's disease. More information at: ExitMarketing.de.  Aerobic exercise that improve heart health  is also good for the mind.  [employer] on Aging have short videos for exercises that you can do at home: BlindWorkshop.com.pt Simple relaxation techniques may help relieve symptoms of anxiety Try to get back to completing activities or hobbies you enjoyed doing in the past. Learn to pace yourself through activities to decrease fatigue. Find out about some local support groups where you can share  experiences with others. Try and achieve 7-8 hours of sleep at night.   Tasks to improve attention/working memory 1. Good sleep hygiene (7-8 hrs of sleep) 2. Learning a new skill (Painting, Carpentry, Pottery, new language, Knitting). 3.Cognitive exercises (keep a daily journal, Puzzles) 4. Physical exercise and training  (30 min/day X 4 days week) 5. Being on Antidepressant if needed 6.Yoga, Meditation, Tai Chi 7. Decrease alcohol intake 8.Have a clear schedule and structure in daily routine   MIND Diet: The Mediterranean-DASH Diet Intervention for Neurodegenerative Delay, or MIND diet, targets the health of the aging brain. Research participants with the highest MIND diet scores had a significantly slower rate of cognitive decline compared with those with the lowest scores. The effects of the MIND diet on cognition showed greater effects than either the Mediterranean or the DASH diet alone.   The healthy items the MIND diet guidelines suggest include:   3+ servings a day of whole grains 1+ servings a day of vegetables (other than green leafy) 6+ servings a week of green leafy vegetables 5+ servings a week of nuts 4+ meals a week of beans 2+ servings a week of berries 2+ meals a week of poultry 1+ meals a week of fish Mainly olive oil if added fat is used  Management of Memory Problems   There are some general things you can do to help manage your memory problems.  Your memory may not in fact recover, but by using techniques and strategies you will be able to manage your memory difficulties better.   1)  Establish a routine. Try to establish and then stick to a regular routine.  By doing this, you will get used to what to expect and you will reduce the need to rely on your memory.  Also, try to do things at the same time of day, such as taking your medication or checking your calendar first thing in the morning. Think about think that you can do as a part of a regular routine and  make a list.  Then enter them into a daily planner to remind you.  This will help you establish a routine.   2)  Organize your environment. Organize your environment so that it is uncluttered.  Decrease visual stimulation.  Place everyday items such as keys or cell phone in the same place every day (ie.  Basket next to front door) Use post it notes with a brief message to yourself (ie. Turn off light, lock the door) Use labels to indicate where things go (ie. Which cupboards are for food, dishes, etc.) Keep a notepad and pen by the telephone to take messages   3)  Memory Aids A diary or journal/notebook/daily planner Making a list (shopping list, chore list, to do list that needs to be done) Using an alarm as a reminder (kitchen timer or cell phone alarm) Using cell phone to store information (Notes, Calendar, Reminders) Calendar/White board placed in a prominent position Post-it notes   In order for memory aids to be useful, you need to have good habits.  It's no good remembering to  make a note in your journal if you don't remember to look in it.  Try setting aside a certain time of day to look in journal.   4)  Improving mood and managing fatigue. There may be other factors that contribute to memory difficulties.  Factors, such as anxiety, depression and tiredness can affect memory. Regular gentle exercise can help improve your mood and give you more energy. Exercise: there are short videos created by the [employer] on Health specially for older adults: https://bit.ly/2I30q97.  Mediterranean diet: which emphasizes fruits, vegetables, whole grains, legumes, fish, and other seafood; unsaturated fats such as olive oils; and low amounts of red meat, eggs, and sweets. A variation of this, called MIND (Mediterranean-DASH Intervention for Neurodegenerative Delay) incorporates the DASH (Dietary Approaches to Stop Hypertension) diet, which has been shown to lower high blood pressure, a risk  factor for Alzheimer's disease. More information at: ExitMarketing.de.  Aerobic exercise that improve heart health is also good for the mind.  [employer] on Aging have short videos for exercises that you can do at home: BlindWorkshop.com.pt Simple relaxation techniques may help relieve symptoms of anxiety Try to get back to completing activities or hobbies you enjoyed doing in the past. Learn to pace yourself through activities to decrease fatigue. Find out about some local support groups where you can share experiences with others. Try and achieve 7-8 hours of sleep at night.   Resources for Family/Caregiver  Online caregiver support groups can be found at WesternTunes.it or call Alzheimer's Association's 24/7 hotline: 669-757-2122. [facility] Memory Counseling Program offers in-person, virtual support groups and individual counseling for both care partners and persons with memory loss. Call for more information at [phone].   Advanced care plan: there are two types of Power of Attorney: healthcare and durable. Healthcare POA is a designated person to make healthcare decisions on your behalf if you were too sick to make them yourself. This person can be selected and documented by your physician. Durable POA has to be set up with a lawyer who takes charge of your finances and estate if you were too sick or cognitively impaired to manage your finances accurately. You can find a local Elder Therapist, art here: [place].at.  Check out www.planyourlifespan.org, which will help you plan before a crisis and decide who will take care of life considerations in a circumstance where you may not be able to speak for yourself.   Helpful books (available on [employer] or your local bookstore):  By Dr. Albina Hull: Keeping Love Alive as Memories Fade: The 5 Love Languages and the Alzheimer's Journey Apr 04, 2015 The Dementia Care  Partner's Workbook: A Guide for Understanding, Education, and [employer] - December 02, 2017.  Both available for less than $15.   Coping with behavior change in dementia: a family caregiver's guide by Asberry Lav & Leonides Ramp A Caregiver's Guide to Dementia: Using Activities and Other Strategies to Prevent, Reduce and Manage Behavioral Symptoms by Beth Brooke. [place] and Catherine Piersol.  Creating Moments of Joy for the Person with Alzheimer's or Dementia 4th edition by Hardy Lia  Caregiver videos on common behaviors related to dementia: PopulationGame.pl  [employer] Caregiver Portal: free to sign up, links to local resources: https://[place]-caregivers.com/login     The unhealthy items, which are higher in saturated and trans fat, include: Less than 5 servings a week of pastries and sweets Less than 4 servings a week of red meat (including beef, pork, lamb, and products made from these meats)  Less than one serving a week of cheese and fried foods Less than 1 tablespoon a day of butter/stick margarine

## 2023-12-18 ENCOUNTER — Ambulatory Visit (HOSPITAL_BASED_OUTPATIENT_CLINIC_OR_DEPARTMENT_OTHER): Payer: Self-pay | Admitting: Orthopaedic Surgery

## 2023-12-18 ENCOUNTER — Ambulatory Visit (INDEPENDENT_AMBULATORY_CARE_PROVIDER_SITE_OTHER): Payer: Medicare Other | Admitting: Family Medicine

## 2023-12-18 ENCOUNTER — Encounter: Payer: Self-pay | Admitting: Family Medicine

## 2023-12-18 VITALS — BP 110/84 | HR 77 | Ht 64.0 in | Wt 135.0 lb

## 2023-12-18 DIAGNOSIS — G3184 Mild cognitive impairment, so stated: Secondary | ICD-10-CM

## 2023-12-18 DIAGNOSIS — S82842A Displaced bimalleolar fracture of left lower leg, initial encounter for closed fracture: Secondary | ICD-10-CM

## 2023-12-18 MED ORDER — DONEPEZIL HCL 5 MG PO TABS: 5.0000 mg | ORAL_TABLET | Freq: Every day | ORAL | 3 refills | Status: DC

## 2023-12-19 ENCOUNTER — Other Ambulatory Visit: Payer: Self-pay

## 2023-12-19 ENCOUNTER — Encounter (HOSPITAL_BASED_OUTPATIENT_CLINIC_OR_DEPARTMENT_OTHER): Payer: Self-pay | Admitting: Orthopaedic Surgery

## 2023-12-19 ENCOUNTER — Encounter (HOSPITAL_BASED_OUTPATIENT_CLINIC_OR_DEPARTMENT_OTHER)
Admission: RE | Disposition: A | Discharge status: Home / Self Care | Payer: Self-pay | Source: Home / Self Care | Attending: Orthopaedic Surgery

## 2023-12-19 ENCOUNTER — Ambulatory Visit (HOSPITAL_BASED_OUTPATIENT_CLINIC_OR_DEPARTMENT_OTHER): Admitting: Anesthesiology

## 2023-12-19 ENCOUNTER — Ambulatory Visit (HOSPITAL_BASED_OUTPATIENT_CLINIC_OR_DEPARTMENT_OTHER)
Admission: RE | Admit: 2023-12-19 | Discharge: 2023-12-19 | Disposition: A | Discharge status: Home / Self Care | Attending: Orthopaedic Surgery | Admitting: Orthopaedic Surgery

## 2023-12-19 ENCOUNTER — Ambulatory Visit (HOSPITAL_BASED_OUTPATIENT_CLINIC_OR_DEPARTMENT_OTHER)

## 2023-12-19 DIAGNOSIS — M1990 Unspecified osteoarthritis, unspecified site: Secondary | ICD-10-CM | POA: Diagnosis not present

## 2023-12-19 DIAGNOSIS — T85848A Pain due to other internal prosthetic devices, implants and grafts, initial encounter: Secondary | ICD-10-CM | POA: Diagnosis not present

## 2023-12-19 DIAGNOSIS — G629 Polyneuropathy, unspecified: Secondary | ICD-10-CM | POA: Diagnosis not present

## 2023-12-19 DIAGNOSIS — Z472 Encounter for removal of internal fixation device: Secondary | ICD-10-CM | POA: Diagnosis not present

## 2023-12-19 DIAGNOSIS — I10 Essential (primary) hypertension: Secondary | ICD-10-CM

## 2023-12-19 DIAGNOSIS — S82842A Displaced bimalleolar fracture of left lower leg, initial encounter for closed fracture: Secondary | ICD-10-CM

## 2023-12-19 DIAGNOSIS — K219 Gastro-esophageal reflux disease without esophagitis: Secondary | ICD-10-CM | POA: Diagnosis not present

## 2023-12-19 DIAGNOSIS — Y798 Miscellaneous orthopedic devices associated with adverse incidents, not elsewhere classified: Secondary | ICD-10-CM | POA: Diagnosis not present

## 2023-12-19 DIAGNOSIS — E039 Hypothyroidism, unspecified: Secondary | ICD-10-CM | POA: Diagnosis not present

## 2023-12-19 DIAGNOSIS — T8484XA Pain due to internal orthopedic prosthetic devices, implants and grafts, initial encounter: Secondary | ICD-10-CM | POA: Insufficient documentation

## 2023-12-19 DIAGNOSIS — T8489XA Other specified complication of internal orthopedic prosthetic devices, implants and grafts, initial encounter: Secondary | ICD-10-CM | POA: Diagnosis not present

## 2023-12-19 DIAGNOSIS — Z01818 Encounter for other preprocedural examination: Secondary | ICD-10-CM

## 2023-12-19 HISTORY — PX: HARDWARE REMOVAL: SHX979

## 2023-12-19 SURGERY — REMOVAL, HARDWARE
Anesthesia: Monitor Anesthesia Care | Site: Ankle | Laterality: Left

## 2023-12-19 MED ORDER — FENTANYL CITRATE (PF) 100 MCG/2ML IJ SOLN
INTRAMUSCULAR | Status: AC
Start: 2023-12-19 — End: 2023-12-19
  Filled 2023-12-19: qty 2

## 2023-12-19 MED ORDER — CEFAZOLIN SODIUM-DEXTROSE 2-4 GM/100ML-% IV SOLN
2.0000 g | INTRAVENOUS | Status: AC
  Administered 2023-12-19: 2 g via INTRAVENOUS

## 2023-12-19 MED ORDER — LIDOCAINE HCL (CARDIAC) PF 100 MG/5ML IV SOSY
PREFILLED_SYRINGE | INTRAVENOUS | Status: DC | PRN
  Administered 2023-12-19: 40 mg via INTRAVENOUS

## 2023-12-19 MED ORDER — LIDOCAINE 2% (20 MG/ML) 5 ML SYRINGE
INTRAMUSCULAR | Status: AC
Start: 2023-12-19 — End: 2023-12-19
  Filled 2023-12-19: qty 5

## 2023-12-19 MED ORDER — OXYCODONE HCL 5 MG PO TABS
5.0000 mg | ORAL_TABLET | Freq: Once | ORAL | Status: AC | PRN
  Administered 2023-12-19: 5 mg via ORAL

## 2023-12-19 MED ORDER — EPHEDRINE SULFATE (PRESSORS) 50 MG/ML IJ SOLN
INTRAMUSCULAR | Status: DC | PRN
  Administered 2023-12-19: 5 mg via INTRAVENOUS

## 2023-12-19 MED ORDER — ACETAMINOPHEN 500 MG PO TABS
1000.0000 mg | ORAL_TABLET | Freq: Once | ORAL | Status: AC
  Administered 2023-12-19: 1000 mg via ORAL

## 2023-12-19 MED ORDER — BUPIVACAINE-EPINEPHRINE 0.25% -1:200000 IJ SOLN
INTRAMUSCULAR | Status: DC | PRN
  Administered 2023-12-19: 18 mL

## 2023-12-19 MED ORDER — CEFAZOLIN SODIUM-DEXTROSE 2-4 GM/100ML-% IV SOLN
INTRAVENOUS | Status: AC
Start: 2023-12-19 — End: 2023-12-19
  Filled 2023-12-19: qty 100

## 2023-12-19 MED ORDER — BUPIVACAINE-EPINEPHRINE (PF) 0.25% -1:200000 IJ SOLN
INTRAMUSCULAR | Status: AC
  Filled 2023-12-19: qty 30

## 2023-12-19 MED ORDER — PROPOFOL 500 MG/50ML IV EMUL
INTRAVENOUS | Status: DC | PRN
  Administered 2023-12-19: 125 ug/kg/min via INTRAVENOUS

## 2023-12-19 MED ORDER — DEXAMETHASONE SODIUM PHOSPHATE 10 MG/ML IJ SOLN
INTRAMUSCULAR | Status: DC | PRN
  Administered 2023-12-19: 5 mg via INTRAVENOUS

## 2023-12-19 MED ORDER — DROPERIDOL 2.5 MG/ML IJ SOLN: 0.6250 mg | Freq: Once | INTRAMUSCULAR | Status: DC | PRN

## 2023-12-19 MED ORDER — OXYCODONE HCL 5 MG/5ML PO SOLN: 5.0000 mg | Freq: Once | ORAL | Status: AC | PRN

## 2023-12-19 MED ORDER — OXYCODONE HCL 5 MG PO TABS
ORAL_TABLET | ORAL | Status: AC
  Filled 2023-12-19: qty 1

## 2023-12-19 MED ORDER — ONDANSETRON HCL 4 MG/2ML IJ SOLN
INTRAMUSCULAR | Status: AC
Start: 2023-12-19 — End: 2023-12-19
  Filled 2023-12-19: qty 2

## 2023-12-19 MED ORDER — DEXAMETHASONE SODIUM PHOSPHATE 10 MG/ML IJ SOLN
INTRAMUSCULAR | Status: AC
  Filled 2023-12-19: qty 1

## 2023-12-19 MED ORDER — FENTANYL CITRATE (PF) 100 MCG/2ML IJ SOLN
INTRAMUSCULAR | Status: DC | PRN
  Administered 2023-12-19: 25 ug via INTRAVENOUS

## 2023-12-19 MED ORDER — FENTANYL CITRATE (PF) 100 MCG/2ML IJ SOLN: 25.0000 ug | INTRAMUSCULAR | Status: DC | PRN

## 2023-12-19 MED ORDER — TRANEXAMIC ACID-NACL 1000-0.7 MG/100ML-% IV SOLN: 1000.0000 mg | INTRAVENOUS | Status: DC

## 2023-12-19 MED ORDER — PHENYLEPHRINE HCL (PRESSORS) 10 MG/ML IV SOLN
INTRAVENOUS | Status: DC | PRN
  Administered 2023-12-19: 80 ug via INTRAVENOUS

## 2023-12-19 MED ORDER — 0.9 % SODIUM CHLORIDE (POUR BTL) OPTIME
TOPICAL | Status: DC | PRN
  Administered 2023-12-19: 200 mL

## 2023-12-19 MED ORDER — GABAPENTIN 300 MG PO CAPS: 300.0000 mg | ORAL_CAPSULE | Freq: Once | ORAL | Status: DC

## 2023-12-19 MED ORDER — LACTATED RINGERS IV SOLN: INTRAVENOUS | Status: DC

## 2023-12-19 MED ORDER — PROPOFOL 10 MG/ML IV BOLUS
INTRAVENOUS | Status: AC
  Filled 2023-12-19: qty 20

## 2023-12-19 MED ORDER — ONDANSETRON HCL 4 MG/2ML IJ SOLN
INTRAMUSCULAR | Status: DC | PRN
Start: 2023-12-19 — End: 2023-12-19
  Administered 2023-12-19: 4 mg via INTRAVENOUS

## 2023-12-19 MED ORDER — ACETAMINOPHEN 500 MG PO TABS
ORAL_TABLET | ORAL | Status: AC
  Filled 2023-12-19: qty 2

## 2023-12-19 SURGICAL SUPPLY — 35 items
BENZOIN TINCTURE PRP APPL 2/3 (GAUZE/BANDAGES/DRESSINGS) IMPLANT
BLADE SURG 15 STRL LF DISP TIS (BLADE) ×1 IMPLANT
BNDG ELASTIC 4INX 5YD STR LF (GAUZE/BANDAGES/DRESSINGS) ×1 IMPLANT
CHLORAPREP W/TINT 26 (MISCELLANEOUS) ×1 IMPLANT
CLSR STERI-STRIP ANTIMIC 1/2X4 (GAUZE/BANDAGES/DRESSINGS) IMPLANT
DRAPE EXTREMITY T 121X128X90 (DISPOSABLE) IMPLANT
DRAPE IMP U-DRAPE 54X76 (DRAPES) IMPLANT
DRAPE INCISE IOBAN 66X45 STRL (DRAPES) IMPLANT
DRAPE U-SHAPE 47X51 STRL (DRAPES) ×2 IMPLANT
DRSG TEGADERM 4X4.75 (GAUZE/BANDAGES/DRESSINGS) IMPLANT
ELECTRODE REM PT RTRN 9FT ADLT (ELECTROSURGICAL) ×1 IMPLANT
GAUZE PAD ABD 8X10 STRL (GAUZE/BANDAGES/DRESSINGS) ×1 IMPLANT
GAUZE SPONGE 4X4 12PLY STRL (GAUZE/BANDAGES/DRESSINGS) ×1 IMPLANT
GAUZE XEROFORM 1X8 LF (GAUZE/BANDAGES/DRESSINGS) ×1 IMPLANT
GLOVE BIO SURGEON STRL SZ 6 (GLOVE) ×2 IMPLANT
GLOVE BIO SURGEON STRL SZ7.5 (GLOVE) ×1 IMPLANT
GLOVE BIOGEL PI IND STRL 6.5 (GLOVE) ×1 IMPLANT
GLOVE BIOGEL PI IND STRL 8 (GLOVE) ×1 IMPLANT
GOWN STRL REUS W/ TWL LRG LVL3 (GOWN DISPOSABLE) ×2 IMPLANT
GOWN STRL REUS W/TWL XL LVL3 (GOWN DISPOSABLE) ×1 IMPLANT
PACK ARTHROSCOPY DSU (CUSTOM PROCEDURE TRAY) ×1 IMPLANT
PACK BASIN DAY SURGERY FS (CUSTOM PROCEDURE TRAY) ×1 IMPLANT
PENCIL SMOKE EVACUATOR (MISCELLANEOUS) ×1 IMPLANT
SHEET MEDIUM DRAPE 40X70 STRL (DRAPES) ×1 IMPLANT
SLEEVE SCD COMPRESS KNEE MED (STOCKING) ×1 IMPLANT
SPONGE T-LAP 4X18 ~~LOC~~+RFID (SPONGE) ×1 IMPLANT
SUCTION TUBE FRAZIER 10FR DISP (SUCTIONS) ×1 IMPLANT
SUT ETHILON 3 0 PS 1 (SUTURE) IMPLANT
SUT MNCRL AB 3-0 PS2 27 (SUTURE) IMPLANT
SUT VIC AB 0 CT1 27XBRD ANBCTR (SUTURE) IMPLANT
SUT VIC AB 2-0 SH 27XBRD (SUTURE) IMPLANT
SYR BULB EAR ULCER 3OZ GRN STR (SYRINGE) ×1 IMPLANT
TOWEL GREEN STERILE FF (TOWEL DISPOSABLE) ×2 IMPLANT
TUBE CONNECTING 20X1/4 (TUBING) IMPLANT
YANKAUER SUCT BULB TIP NO VENT (SUCTIONS) IMPLANT

## 2023-12-19 NOTE — Discharge Instructions (Addendum)
 Discharge Instructions    Attending Surgeon: Wilhelmenia Harada, MD Office Phone Number: [phone]   Diagnosis and Procedures:    Surgeries Performed: Right ankle symptomatic hardware removal  Discharge Plan:    Diet: Resume usual diet. Begin with light or bland foods.  Drink plenty of fluids.  Activity:  Weightbearing as tolerated right ankle. You are advised to go home directly from the hospital or surgical center. Restrict your activities.  GENERAL INSTRUCTIONS: 1.  Please apply ice to your wound to help with swelling and inflammation. This will improve your comfort and your overall recovery following surgery.     2. Please call Dr. Verline Glow office at [phone] with questions Monday-Friday during business hours. If no one answers, please leave a message and someone should get back to the patient within 24 hours. For emergencies please call 911 or proceed to the emergency room.   3. Patient to notify surgical team if experiences any of the following: Bowel/Bladder dysfunction, uncontrolled pain, nerve/muscle weakness, incision with increased drainage or redness, nausea/vomiting and Fever greater than 101.0 F.  Be alert for signs of infection including redness, streaking, odor, fever or chills. Be alert for excessive pain or bleeding and notify your surgeon immediately.  WOUND INSTRUCTIONS:   Leave your dressing, cast, or splint in place until your post operative visit.  Keep it clean and dry.  Always keep the incision clean and dry until the staples/sutures are removed. If there is no drainage from the incision you should keep it open to air. If there is drainage from the incision you must keep it covered at all times until the drainage stops  Do not soak in a bath tub, hot tub, pool, lake or other body of water until 21 days after your surgery and your incision is completely dry and healed.  If you have removable sutures (or staples) they must be removed 10-14 days  (unless otherwise instructed) from the day of your surgery.     1)  Elevate the extremity as much as possible.  2)  Keep the dressing clean and dry.  3)  Please call us  if the dressing becomes wet or dirty.  4)  If you are experiencing worsening pain or worsening swelling, please call.     MEDICATIONS: Resume all previous home medications at the previous prescribed dose and frequency unless otherwise noted Start taking the  pain medications on an as-needed basis as prescribed  Please taper down pain medication over the next week following surgery.  Ideally you should not require a refill of any narcotic pain medication.  Take pain medication with food to minimize nausea. In addition to the prescribed pain medication, you may take over-the-counter pain relievers such as Tylenol .  Do NOT take additional tylenol  if your pain medication already has tylenol  in it.  Aspirin  325mg  daily per instructions on bottle. Narcotic policy: Per [facility] clinic policy, our goal is ensure optimal postoperative pain control with a multimodal pain management strategy. For all [facility] patients, our goal is to wean post-operative narcotic medications by 6 weeks post-operatively, and many times sooner. If this is not possible due to utilization of pain medication prior to surgery, your [facility] doctor will support your acute post-operative pain control for the first 6 weeks postoperatively, with a plan to transition you back to your primary pain team following that. Max Spain will work to ensure a Therapist, occupational.       FOLLOWUP INSTRUCTIONS: 1. Follow up at the Physical Therapy  Clinic 3-4 days following surgery. This appointment should be scheduled unless other arrangements have been made.The Physical Therapy scheduling number is [phone] if an appointment has not already been arranged.  2. Contact Dr. Verline Glow office during office hours at [phone] or the practice after hours line at [phone] for  non-emergencies. For medical emergencies call 911.   Discharge Location: Home  No Tylenol  before 7pm today.  You had 5mg  Oxycodone  at 5pm  Post Anesthesia Home Care Instructions  Activity: Get plenty of rest for the remainder of the day. A responsible individual must stay with you for 24 hours following the procedure.  For the next 24 hours, DO NOT: -Drive a car -Advertising copywriter -Drink alcoholic beverages -Take any medication unless instructed by your physician -Make any legal decisions or sign important papers.  Meals: Start with liquid foods such as gelatin or soup. Progress to regular foods as tolerated. Avoid greasy, spicy, heavy foods. If nausea and/or vomiting occur, drink only clear liquids until the nausea and/or vomiting subsides. Call your physician if vomiting continues.  Special Instructions/Symptoms: Your throat may feel dry or sore from the anesthesia or the breathing tube placed in your throat during surgery. If this causes discomfort, gargle with warm salt water. The discomfort should disappear within 24 hours.  If you had a scopolamine patch placed behind your ear for the management of post- operative nausea and/or vomiting:  1. The medication in the patch is effective for 72 hours, after which it should be removed.  Wrap patch in a tissue and discard in the trash. Wash hands thoroughly with soap and water. 2. You may remove the patch earlier than 72 hours if you experience unpleasant side effects which may include dry mouth, dizziness or visual disturbances. 3. Avoid touching the patch. Wash your hands with soap and water after contact with the patch.

## 2023-12-19 NOTE — Transfer of Care (Signed)
 Immediate Anesthesia Transfer of Care Note  Patient: Shelly Pearson  Procedure(s) Performed: REMOVAL, HARDWARE (Left: Ankle)  Patient Location: PACU  Anesthesia Type:MAC  Level of Consciousness: awake, alert , oriented, and patient cooperative  Airway & Oxygen Therapy: Patient Spontanous Breathing  Post-op Assessment: Report given to RN and Post -op Vital signs reviewed and stable  Post vital signs: Reviewed and stable  Last Vitals:  Vitals Value Taken Time  BP 130/64 12/19/23 16:26  Temp    Pulse 71 12/19/23 16:27  Resp 9 12/19/23 16:27  SpO2 98 % 12/19/23 16:27  Vitals shown include unfiled device data.  Last Pain:  Vitals:   12/19/23 1302  TempSrc: Tympanic  PainSc: 0-No pain      Patients Stated Pain Goal: 6 (12/19/23 1302)  Complications: No notable events documented.

## 2023-12-19 NOTE — Op Note (Signed)
   Date of Surgery: 12/19/2023  INDICATIONS: Shelly Pearson is a 83 y.o.-year-old female with symptomatic left ankle medial hardware.  The risk and benefits of the procedure were discussed in detail and documented in the pre-operative evaluation.   PREOPERATIVE DIAGNOSIS: 1.  Symptomatic left medial ankle hardware  POSTOPERATIVE DIAGNOSIS: Same.  PROCEDURE: 1.  Removal of hardware left ankle  SURGEON: Carmina Chris MD  ASSISTANT: Deon Flatter, ATC  ANESTHESIA: MAC  IV FLUIDS AND URINE: See anesthesia record.  ANTIBIOTICS: Ancef   ESTIMATED BLOOD LOSS: 5 mL.  IMPLANTS:  * No implants in log *  DRAINS: None  CULTURES: None  COMPLICATIONS: none  DESCRIPTION OF PROCEDURE:   The patient was identified in the preoperative holding area.  The correct site was marked according to universal protocol.  She was subsequently taken back to the operating room.  She is transferred over the operating room table.  Antibiotics were given 1 hour prior to skin incision.  Anesthesia was induced in the MAC form.  I then numbed the area with 10 cc of 0.25% Marcaine  over the medial ankle.  15 blade was used to incise directly midline longitudinally with the medial malleolus.  There was a bursal layer that was quite thick that was excised with electrocautery.  The screws were identified by making a rent in the retinaculum and were subsequently removed.  These were close then back with 0 Vicryl.  X-ray confirmed anatomic ankle mortise.  All counts correct in the case.  She was closed in layers of 2-0 Vicryl and 3-0 Monocryl.  Dressing was applied with Xeroform gauze Webril and Ace.  She will be weightbearing as tolerated.    POSTOPERATIVE PLAN: She be weightbearing as tolerated.  She will be placed on aspirin  for blood clot prevention.  She will take Tylenol  and ibuprofen  as needed for pain control  Carmina Chris, MD 4:20 PM

## 2023-12-19 NOTE — Brief Op Note (Signed)
   Brief Op Note  Date of Surgery: 12/19/2023  Preoperative Diagnosis: LEFT ANKLE SYMPTOMATIC HARDWARE  Postoperative Diagnosis: same  Procedure: Procedure(s): REMOVAL, HARDWARE  Implants: * No implants in log *  Surgeons: Surgeon(s): Wilhelmenia Harada, MD  Anesthesia: Monitor Anesthesia Care    Estimated Blood Loss: See anesthesia record  Complications: None  Condition to PACU: Stable  Carmina Chris, MD 12/19/2023 4:20 PM

## 2023-12-19 NOTE — Anesthesia Postprocedure Evaluation (Signed)
 Anesthesia Post Note  Patient: Shelly Pearson  Procedure(s) Performed: REMOVAL, HARDWARE (Left: Ankle)     Patient location during evaluation: PACU Anesthesia Type: MAC Level of consciousness: awake and alert and oriented Pain management: pain level controlled Vital Signs Assessment: post-procedure vital signs reviewed and stable Respiratory status: spontaneous breathing, nonlabored ventilation and respiratory function stable Cardiovascular status: stable and blood pressure returned to baseline Postop Assessment: no apparent nausea or vomiting Anesthetic complications: no   No notable events documented.  Last Vitals:  Vitals:   12/19/23 1630 12/19/23 1653  BP: 130/64 (!) 148/63  Pulse: 68 70  Resp: 12 16  Temp:  (!) 36.2 C  SpO2: 96% 98%    Last Pain:  Vitals:   12/19/23 1653  TempSrc:   PainSc: 5                  Elgie Maziarz A.

## 2023-12-19 NOTE — Anesthesia Preprocedure Evaluation (Signed)
 Anesthesia Evaluation  Patient identified by MRN, date of birth, ID band Patient awake    Reviewed: Allergy & Precautions, NPO status , Patient's Chart, lab work & pertinent test results  Airway Mallampati: II  TM Distance: >3 FB Neck ROM: Full    Dental no notable dental hx.    Pulmonary neg pulmonary ROS   Pulmonary exam normal        Cardiovascular hypertension,  Rhythm:Regular Rate:Normal     Neuro/Psych negative neurological ROS  negative psych ROS   GI/Hepatic Neg liver ROS,GERD  ,,  Endo/Other  Hypothyroidism    Renal/GU negative Renal ROS  negative genitourinary   Musculoskeletal  (+) Arthritis , Osteoarthritis,    Abdominal Normal abdominal exam  (+)   Peds  Hematology negative hematology ROS (+)   Anesthesia Other Findings   Reproductive/Obstetrics                             Anesthesia Physical Anesthesia Plan  ASA: 2  Anesthesia Plan: MAC   Post-op Pain Management:    Induction: Intravenous  PONV Risk Score and Plan: 2 and Ondansetron , Dexamethasone , Propofol  infusion and Treatment may vary due to age or medical condition  Airway Management Planned: Simple Face Mask and Nasal Cannula  Additional Equipment: None  Intra-op Plan:   Post-operative Plan:   Informed Consent: I have reviewed the patients History and Physical, chart, labs and discussed the procedure including the risks, benefits and alternatives for the proposed anesthesia with the patient or authorized representative who has indicated his/her understanding and acceptance.     Dental advisory given  Plan Discussed with: CRNA  Anesthesia Plan Comments:        Anesthesia Quick Evaluation

## 2023-12-19 NOTE — Interval H&P Note (Signed)
 History and Physical Interval Note:  12/19/2023 12:58 PM  Shelly Pearson  has presented today for surgery, with the diagnosis of LEFT ANKLE SYMPTOMATIC HARDWARE.  The various methods of treatment have been discussed with the patient and family. After consideration of risks, benefits and other options for treatment, the patient has consented to  Procedure(s) with comments: REMOVAL, HARDWARE (Left) - LEFT  ANKLE REMOVAL OF HARDWARE as a surgical intervention.  The patient's history has been reviewed, patient examined, no change in status, stable for surgery.  I have reviewed the patient's chart and labs.  Questions were answered to the patient's satisfaction.     Maryella Abood

## 2023-12-20 ENCOUNTER — Encounter (HOSPITAL_BASED_OUTPATIENT_CLINIC_OR_DEPARTMENT_OTHER): Payer: Self-pay | Admitting: Orthopaedic Surgery

## 2023-12-22 ENCOUNTER — Ambulatory Visit

## 2023-12-25 ENCOUNTER — Other Ambulatory Visit (HOSPITAL_BASED_OUTPATIENT_CLINIC_OR_DEPARTMENT_OTHER): Payer: Self-pay | Admitting: Orthopaedic Surgery

## 2023-12-25 ENCOUNTER — Telehealth: Payer: Self-pay | Admitting: Orthopaedic Surgery

## 2023-12-25 DIAGNOSIS — S82842A Displaced bimalleolar fracture of left lower leg, initial encounter for closed fracture: Secondary | ICD-10-CM

## 2023-12-25 NOTE — Telephone Encounter (Signed)
 Pt called stating she ha surgery last week and waiting for script to be sent for physical therapy. Asking for referral be sent to [facility]. Pt phone number is [phone].

## 2023-12-25 NOTE — Telephone Encounter (Signed)
 Called and confirmed with patient that her PT order was sent to [facility].

## 2023-12-26 ENCOUNTER — Ambulatory Visit: Attending: Orthopaedic Surgery | Admitting: Physical Therapy

## 2023-12-26 ENCOUNTER — Encounter: Payer: Self-pay | Admitting: Physical Therapy

## 2023-12-26 ENCOUNTER — Other Ambulatory Visit: Payer: Self-pay

## 2023-12-26 DIAGNOSIS — R2681 Unsteadiness on feet: Secondary | ICD-10-CM | POA: Insufficient documentation

## 2023-12-26 DIAGNOSIS — M25672 Stiffness of left ankle, not elsewhere classified: Secondary | ICD-10-CM | POA: Insufficient documentation

## 2023-12-26 DIAGNOSIS — R262 Difficulty in walking, not elsewhere classified: Secondary | ICD-10-CM | POA: Insufficient documentation

## 2023-12-26 DIAGNOSIS — R600 Localized edema: Secondary | ICD-10-CM | POA: Diagnosis not present

## 2023-12-26 DIAGNOSIS — M25572 Pain in left ankle and joints of left foot: Secondary | ICD-10-CM | POA: Insufficient documentation

## 2023-12-26 DIAGNOSIS — S82842A Displaced bimalleolar fracture of left lower leg, initial encounter for closed fracture: Secondary | ICD-10-CM | POA: Insufficient documentation

## 2023-12-26 NOTE — Therapy (Signed)
 OUTPATIENT PHYSICAL THERAPY LOWER EXTREMITY EVALUATION   Patient Name: Shelly Pearson MRN: [id number] DOB:December 19, 1940, 83 y.o., female Today's Date: 12/26/2023  END OF SESSION:  PT End of Session - 12/26/23 1312     Visit Number 1    Number of Visits 17    Date for PT Re-Evaluation 02/20/24    Authorization Type MCR and USAA    Authorization Time Period 12/26/23 to 02/20/24    Progress Note Due on Visit 10    PT Start Time 1146    PT Stop Time 1226    PT Time Calculation (min) 40 min    Activity Tolerance Patient tolerated treatment well    Behavior During Therapy [facility] for tasks assessed/performed          Past Medical History:  Diagnosis Date   B12 deficiency 03/02/2020   GERD (gastroesophageal reflux disease)    Hypertension    Seasonal allergies    Thyroid  disease    Urge incontinence of urine 12/10/2020   Urgency incontinence    Past Surgical History:  Procedure Laterality Date   ABDOMINAL HYSTERECTOMY     CESAREAN SECTION     CYSTOCELE REPAIR     HARDWARE REMOVAL Left 12/19/2023   Procedure: REMOVAL, HARDWARE;  Surgeon: Genelle Standing, MD;  Location: [facility];  Service: Orthopedics;  Laterality: Left;  LEFT  ANKLE REMOVAL OF HARDWARE   ORIF ANKLE FRACTURE Left 06/08/2023   Procedure: LEFT OPEN REDUCTION INTERNAL FIXATION (ORIF) ANKLE FRACTURE;  Surgeon: Genelle Standing, MD;  Location: [facility];  Service: Orthopedics;  Laterality: Left;   ROTATOR CUFF REPAIR Right    2018   Patient Active Problem List   Diagnosis Date Noted   Pain from implanted hardware 12/19/2023   Status post open reduction with internal fixation (ORIF) of fracture of ankle- Left 10/06/2023   Arthritis of midtarsal joint of left foot 10/06/2023   Exudative age-related macular degeneration, right eye, with active choroidal neovascularization (HCC) 05/24/2022   Decreased grip strength of right hand 09/16/2021   Gait disturbance 07/12/2021   Mild cognitive impairment  07/12/2021   Thyroid  goiter 12/10/2020   Mixed hyperlipidemia 12/10/2020   Degenerative cervical spinal stenosis, mild C4-C5 and C5-C6 12/10/2020   Lumbar degenerative disc disease, L4-L5 with left lateral recess stenosis 12/10/2020   Personal history of fall 09/01/2020   Congenital cavus deformity of both feet 03/02/2020   Metatarsalgia of both feet 03/02/2020   Hand arthritis 03/02/2020   Arthritis of carpometacarpal (CMC) joint of both thumbs 09/19/2019   Overactive bladder 09/19/2019   Sciatica 04/26/2019   Paresthesia of both feet 02/04/2019   Epidermal cyst 04/17/2018   Allergic rhinitis 06/23/2015   Eczema 06/23/2015   Gastro-esophageal reflux disease without esophagitis 06/23/2015   Essential hypertension 06/23/2015   Hypothyroidism 06/23/2015    PCP: Thedora Senior MD   REFERRING PROVIDER: Genelle Standing, MD  REFERRING DIAG:  Diagnosis  [phone] (ICD-10-CM) - Bimalleolar ankle fracture, left, closed, initial encounter    THERAPY DIAG:  Stiffness of left ankle, not elsewhere classified  Pain in left ankle and joints of left foot  Localized edema  Difficulty in walking, not elsewhere classified  Unsteadiness on feet  Rationale for Evaluation and Treatment: Rehabilitation  ONSET DATE: L ankle ORIF 06/2023, hardware removal 12/19/23  SUBJECTIVE:   SUBJECTIVE STATEMENT:  I was feeling pretty good after I finished PT, after that the right ankle I still had a lot of swelling and issues getting into shoes. It stayed  tender and gave me some shooting pains down into the foot. Dr. Genelle said that it might help to take out the hardware. I have been careful with it, I know I can put weight on it but being careful when it hurts.  PERTINENT HISTORY:  See above in addition to the following from MD note:   6 months status post left ankle open reduction internal fixation. She is somewhat symptomatic about her medial malleolar screws. She is also having symptoms about  her peroneal tendon insertions at the plantar aspect of the foot as well as issues with neuropathy which she is having on the other side as well. Given this I do believe she would benefit from a custom orthotic. I will plan to see her back as needed should she want to consider medial malleolar screw removal PAIN:  Are you having pain? Yes: NPRS scale: 3/10 Pain location: medial L ankle with some shooting pains around foot occasionally  Pain description: sharp, sometimes shooting  Aggravating factors: pressure/WB  Relieving factors: keeping it still and elevating   PRECAUTIONS: None  RED FLAGS: None   WEIGHT BEARING RESTRICTIONS:  weightbearing as tolerated per op note   FALLS:  Has patient fallen in last 6 months? No  LIVING ENVIRONMENT: Lives with: lives with their family Lives in: House/apartment Stairs: 4STE with rails, has second floor (steps with split landing) Has following equipment at home: Vannie - 2 wheeled and Wheelchair (manual)  OCCUPATION: retired- Microbiologist and flight attendant   PLOF: Independent, Independent with basic ADLs, Independent with gait, and Independent with transfers  PATIENT GOALS: be able to get around without sharp pains   NEXT MD VISIT: Dr. Lee /2/25  OBJECTIVE:  Note: Objective measures were completed at Evaluation unless otherwise noted.    PATIENT SURVEYS:   THE PATIENT SPECIFIC FUNCTIONAL SCALE  Place score of 0-10 (0 = unable to perform activity and 10 = able to perform activity at the same level as before injury or problem)  Activity Date: Eval 12/26/23    Grocery shopping  0    2. Cleaning house  5    3. Cooking  2    4.      Total Score 2.3      Total Score = Sum of activity scores/number of activities  Minimally Detectable Change: 3 points (for single activity); 2 points (for average score)  [name] (nd). The Patient Specific Functional Scale . Retrieved from  [email or web address]   COGNITION: Overall cognitive status: hx of STM impairment      SENSATION: Not tested  EDEMA:   Appropriate for post-op status, good capillary refill       LOWER EXTREMITY ROM:  Active ROM Right eval Left eval  Hip flexion    Hip extension    Hip abduction    Hip adduction    Hip internal rotation    Hip external rotation    Knee flexion    Knee extension    Ankle dorsiflexion  -25*  Ankle plantarflexion  58*  Ankle inversion  21*  Ankle eversion  5*   (Blank rows = not tested)  LOWER EXTREMITY MMT:  MMT Right eval Left eval  Hip flexion    Hip extension    Hip abduction    Hip adduction    Hip internal rotation    Hip external rotation    Knee flexion    Knee extension    Ankle dorsiflexion  3-  Ankle plantarflexion  3-  Ankle inversion    Ankle eversion     (Blank rows = not tested)   GAIT: Distance walked: deferred today due to pain/pulling at surgical site   Able to transfer and wt shift well with RW                                                                                                                                 TREATMENT DATE:   12/26/23  Eval, POC, HEP and education as below     PATIENT EDUCATION:  Education details: exam findings, POC, HEP, encouraged WBAT, ice, elevation  Person educated: Patient Education method: Explanation, Demonstration, and Handouts Education comprehension: verbalized understanding, returned demonstration, and needs further education  HOME EXERCISE PROGRAM:  Access Code: [email or web address] URL: [email or web address] Date: 12/26/2023 Prepared by: Josette Rough  Exercises - Supine Ankle Pumps in Elevation on Pillows  - 2-3 x daily - 7 x weekly - 1 sets - 10 reps - Ankle Circles in Elevation  - 2-3 x daily - 7 x weekly - 1 sets - 10 reps - Ankle Alphabet in Elevation  - 2-3 x daily - 7 x weekly - 1 sets - 10 reps -  Lateral Weight Shift with Parallel Bars (BKA)  - 1 x daily - 7 x weekly - 1 sets - 10 reps - 2 seconds  hold  ASSESSMENT:  CLINICAL IMPRESSION: Patient is a 83 y.o. F who was seen today for physical therapy evaluation and treatment for  Diagnosis  S82.842A (ICD-10-CM) - Bimalleolar ankle fracture, left, closed, initial encounter  . Had her original ankle fracture and ORIF back in December 2024, she finished her rehab with us  and then continued to have pain and edema- had hardware removal on 12/19/23. Objectives as above. Anticipate that she will recover much quicker than original round of surgery.   OBJECTIVE IMPAIRMENTS: Abnormal gait, decreased activity tolerance, decreased balance, decreased knowledge of use of DME, decreased mobility, difficulty walking, decreased ROM, decreased strength, and pain.   ACTIVITY LIMITATIONS: standing, stairs, transfers, and locomotion level  PARTICIPATION LIMITATIONS: meal prep, cleaning, laundry, driving, shopping, and community activity  PERSONAL FACTORS: Behavior pattern, Education, Fitness, Social background, and Time since onset of injury/illness/exacerbation are also affecting patient's functional outcome.   REHAB POTENTIAL: Good  CLINICAL DECISION MAKING: Stable/uncomplicated  EVALUATION COMPLEXITY: Low   GOALS: Goals reviewed with patient? No  SHORT TERM GOALS: Target date: 01/23/2024   Will be compliant with appropriate progressive HEP  Baseline: Goal status: INITIAL  2.  L ankle AROM to improve to: -10* DF, PF to 65*, eversion to 12* Baseline:  Goal status: INITIAL  3.  Will be independent with appropriate edema management techniques  Baseline:  Goal status: INITIAL  4.  Will be consistently ambulatory for household distances with LRAD  Baseline:  Goal status: INITIAL    LONG TERM GOALS: Target date: 02/20/2024    MMT to be  at least 4/5 L ankle  Baseline:  Goal status: INITIAL  2.  L ankle AROM to be as follows: DF  10* Baseline:  Goal status: INITIAL  3.  Will score at least 20/24 on DGI  Baseline:  Goal status: INITIAL  4.  Will be able to ambulate community distances with LRAD, L ankle pain no more than 3/10 Baseline:  Goal status: INITIAL  5.  PSFS to have improved by 3 points  Baseline:  Goal status: INITIAL     PLAN:  PT FREQUENCY: 2x/week  PT DURATION: 8 weeks  PLANNED INTERVENTIONS: 97750- Physical Performance Testing, 97110-Therapeutic exercises, 97530- Therapeutic activity, [id number]- Neuromuscular re-education, 97535- Self Care, [id number]- Manual therapy, 97116- Gait training, 97016- Vasopneumatic device, and Cryotherapy  PLAN FOR NEXT SESSION: WBAT per op notes, ROM and edema management, progressive loading and gait training as tolerated, progressive strengthening   Josette Rough, PT, DPT 12/26/23 1:14 PM

## 2023-12-27 ENCOUNTER — Encounter: Payer: Self-pay | Admitting: Physical Therapy

## 2023-12-27 ENCOUNTER — Ambulatory Visit: Admitting: Physical Therapy

## 2023-12-27 DIAGNOSIS — M25572 Pain in left ankle and joints of left foot: Secondary | ICD-10-CM | POA: Diagnosis not present

## 2023-12-27 DIAGNOSIS — M25672 Stiffness of left ankle, not elsewhere classified: Secondary | ICD-10-CM | POA: Diagnosis not present

## 2023-12-27 DIAGNOSIS — R2681 Unsteadiness on feet: Secondary | ICD-10-CM

## 2023-12-27 DIAGNOSIS — R600 Localized edema: Secondary | ICD-10-CM | POA: Diagnosis not present

## 2023-12-27 DIAGNOSIS — R262 Difficulty in walking, not elsewhere classified: Secondary | ICD-10-CM | POA: Diagnosis not present

## 2023-12-27 DIAGNOSIS — S82842A Displaced bimalleolar fracture of left lower leg, initial encounter for closed fracture: Secondary | ICD-10-CM | POA: Diagnosis not present

## 2023-12-27 NOTE — Therapy (Signed)
 OUTPATIENT PHYSICAL THERAPY LOWER EXTREMITY EVALUATION   Patient Name: Shelly Pearson MRN: [id number] DOB:[birth year], 83 y.o., female Today's Date: 12/27/2023  END OF SESSION:  PT End of Session - 12/27/23 0759     Visit Number 2    Date for PT Re-Evaluation 02/20/24    PT Start Time 0800    PT Stop Time 0845    PT Time Calculation (min) 45 min    Activity Tolerance Patient tolerated treatment well    Behavior During Therapy [facility] for tasks assessed/performed          Past Medical History:  Diagnosis Date   B12 deficiency 03/02/2020   GERD (gastroesophageal reflux disease)    Hypertension    Seasonal allergies    Thyroid  disease    Urge incontinence of urine 12/10/2020   Urgency incontinence    Past Surgical History:  Procedure Laterality Date   ABDOMINAL HYSTERECTOMY     CESAREAN SECTION     CYSTOCELE REPAIR     HARDWARE REMOVAL Left 12/19/2023   Procedure: REMOVAL, HARDWARE;  Surgeon: Genelle Standing, MD;  Location: [facility];  Service: Orthopedics;  Laterality: Left;  LEFT  ANKLE REMOVAL OF HARDWARE   ORIF ANKLE FRACTURE Left 06/08/2023   Procedure: LEFT OPEN REDUCTION INTERNAL FIXATION (ORIF) ANKLE FRACTURE;  Surgeon: Genelle Standing, MD;  Location: [facility];  Service: Orthopedics;  Laterality: Left;   ROTATOR CUFF REPAIR Right    2018   Patient Active Problem List   Diagnosis Date Noted   Pain from implanted hardware 12/19/2023   Status post open reduction with internal fixation (ORIF) of fracture of ankle- Left 10/06/2023   Arthritis of midtarsal joint of left foot 10/06/2023   Exudative age-related macular degeneration, right eye, with active choroidal neovascularization (HCC) 05/24/2022   Decreased grip strength of right hand 09/16/2021   Gait disturbance 07/12/2021   Mild cognitive impairment 07/12/2021   Thyroid  goiter 12/10/2020   Mixed hyperlipidemia 12/10/2020   Degenerative cervical spinal stenosis, mild C4-C5 and C5-C6 12/10/2020    Lumbar degenerative disc disease, L4-L5 with left lateral recess stenosis 12/10/2020   Personal history of fall 09/01/2020   Congenital cavus deformity of both feet 03/02/2020   Metatarsalgia of both feet 03/02/2020   Hand arthritis 03/02/2020   Arthritis of carpometacarpal (CMC) joint of both thumbs 09/19/2019   Overactive bladder 09/19/2019   Sciatica 04/26/2019   Paresthesia of both feet 02/04/2019   Epidermal cyst 04/17/2018   Allergic rhinitis 06/23/2015   Eczema 06/23/2015   Gastro-esophageal reflux disease without esophagitis 06/23/2015   Essential hypertension 06/23/2015   Hypothyroidism 06/23/2015    PCP: Thedora Senior MD   REFERRING PROVIDER: Genelle Standing, MD  REFERRING DIAG:  Diagnosis  [phone] (ICD-10-CM) - Bimalleolar ankle fracture, left, closed, initial encounter    THERAPY DIAG:  Stiffness of left ankle, not elsewhere classified  Pain in left ankle and joints of left foot  Localized edema  Difficulty in walking, not elsewhere classified  Unsteadiness on feet  Rationale for Evaluation and Treatment: Rehabilitation  ONSET DATE: L ankle ORIF 06/2023, hardware removal 12/19/23  SUBJECTIVE:   SUBJECTIVE STATEMENT:    12/27/23 kind of swollen today, discomfort when she puts weight on it  I was feeling pretty good after I finished PT, after that the right ankle I still had a lot of swelling and issues getting into shoes. It stayed tender and gave me some shooting pains down into the foot. Dr. Genelle said that it might help  to take out the hardware. I have been careful with it, I know I can put weight on it but being careful when it hurts.  PERTINENT HISTORY:  See above in addition to the following from MD note:   6 months status post left ankle open reduction internal fixation. She is somewhat symptomatic about her medial malleolar screws. She is also having symptoms about her peroneal tendon insertions at the plantar aspect of the foot as well as  issues with neuropathy which she is having on the other side as well. Given this I do believe she would benefit from a custom orthotic. I will plan to see her back as needed should she want to consider medial malleolar screw removal PAIN:  Are you having pain? Yes: NPRS scale: 4/10 Pain location: medial L ankle with some shooting pains around foot occasionally  Pain description: constant discomfort Aggravating factors: pressure/WB  Relieving factors: keeping it still and elevating   PRECAUTIONS: None  RED FLAGS: None   WEIGHT BEARING RESTRICTIONS:  weightbearing as tolerated per op note   FALLS:  Has patient fallen in last 6 months? No  LIVING ENVIRONMENT: Lives with: lives with their family Lives in: House/apartment Stairs: 4STE with rails, has second floor (steps with split landing) Has following equipment at home: Vannie - 2 wheeled and Wheelchair (manual)  OCCUPATION: retired- Microbiologist and flight attendant   PLOF: Independent, Independent with basic ADLs, Independent with gait, and Independent with transfers  PATIENT GOALS: be able to get around without sharp pains   NEXT MD VISIT: Dr. Dorrie /2/25  OBJECTIVE:  Note: Objective measures were completed at Evaluation unless otherwise noted.    PATIENT SURVEYS:   THE PATIENT SPECIFIC FUNCTIONAL SCALE  Place score of 0-10 (0 = unable to perform activity and 10 = able to perform activity at the same level as before injury or problem)  Activity Date: Eval 12/26/23    Grocery shopping  0    2. Cleaning house  5    3. Cooking  2    4.      Total Score 2.3      Total Score = Sum of activity scores/number of activities  Minimally Detectable Change: 3 points (for single activity); 2 points (for average score)  [name] (nd). The Patient Specific Functional Scale . Retrieved from [email or web address]   COGNITION: Overall  cognitive status: hx of STM impairment      SENSATION: Not tested  EDEMA:   Appropriate for post-op status, good capillary refill       LOWER EXTREMITY ROM:  Active ROM Right eval Left eval  Hip flexion    Hip extension    Hip abduction    Hip adduction    Hip internal rotation    Hip external rotation    Knee flexion    Knee extension    Ankle dorsiflexion  -25*  Ankle plantarflexion  58*  Ankle inversion  21*  Ankle eversion  5*   (Blank rows = not tested)  LOWER EXTREMITY MMT:  MMT Right eval Left eval  Hip flexion    Hip extension    Hip abduction    Hip adduction    Hip internal rotation    Hip external rotation    Knee flexion    Knee extension    Ankle dorsiflexion  3-  Ankle plantarflexion  3-  Ankle inversion    Ankle eversion     (Blank rows = not  tested)   GAIT: Distance walked: deferred today due to pain/pulling at surgical site   Able to transfer and wt shift well with RW                                                                                                                                 TREATMENT DATE:  12/27/23 L ankle PROM w/ enf range holds  Grade 1 jt mobs L ankle AROM PR, DF, Iev and EX x10 4 way ankle Tband  red 2x15 Ankle ROM on Fitter gear x10 Weight shifts  Small marches   12/26/23  Eval, POC, HEP and education as below     PATIENT EDUCATION:  Education details: exam findings, POC, HEP, encouraged WBAT, ice, elevation  Person educated: Patient Education method: Explanation, Demonstration, and Handouts Education comprehension: verbalized understanding, returned demonstration, and needs further education  HOME EXERCISE PROGRAM:  Access Code: [email or web address] URL: [email or web address] Date: 12/26/2023 Prepared by: Josette Rough  Exercises - Supine Ankle Pumps in Elevation on Pillows  - 2-3 x daily - 7 x weekly - 1 sets - 10 reps - Ankle Circles in Elevation  - 2-3 x daily - 7 x weekly - 1 sets  - 10 reps - Ankle Alphabet in Elevation  - 2-3 x daily - 7 x weekly - 1 sets - 10 reps - Lateral Weight Shift with Parallel Bars (BKA)  - 1 x daily - 7 x weekly - 1 sets - 10 reps - 2 seconds  hold  ASSESSMENT:  CLINICAL IMPRESSION: Patient is a 83 y.o. F who was seen today for physical therapy evaluation and treatment for S82.842A (ICD-10-CM) - Bimalleolar ankle fracture, left, closed, initial encounter Had her original ankle fracture and ORIF back in December 2024, she finished her rehab with us  and then continued to have pain and edema- had hardware removal on 12/19/23. Pt enters on WC reporting that she wanted to be very cautious this time around. She is WBAT but has been limiting all weight bearing activities due to discomfort. Session consisted of light strengthening and ROM. Discomfort reported throughout. Will be returning to the MD next week. Anticipate that she will recover much quicker than original round of surgery.   OBJECTIVE IMPAIRMENTS: Abnormal gait, decreased activity tolerance, decreased balance, decreased knowledge of use of DME, decreased mobility, difficulty walking, decreased ROM, decreased strength, and pain.   ACTIVITY LIMITATIONS: standing, stairs, transfers, and locomotion level  PARTICIPATION LIMITATIONS: meal prep, cleaning, laundry, driving, shopping, and community activity  PERSONAL FACTORS: Behavior pattern, Education, Fitness, Social background, and Time since onset of injury/illness/exacerbation are also affecting patient's functional outcome.   REHAB POTENTIAL: Good  CLINICAL DECISION MAKING: Stable/uncomplicated  EVALUATION COMPLEXITY: Low   GOALS: Goals reviewed with patient? No  SHORT TERM GOALS: Target date: 01/23/2024   Will be compliant with appropriate progressive HEP  Baseline: Goal status: INITIAL  2.  L ankle AROM to improve to: -10* DF, PF  to 65*, eversion to 12* Baseline:  Goal status: INITIAL  3.  Will be independent with appropriate  edema management techniques  Baseline:  Goal status: INITIAL  4.  Will be consistently ambulatory for household distances with LRAD  Baseline:  Goal status: INITIAL    LONG TERM GOALS: Target date: 02/20/2024    MMT to be at least 4/5 L ankle  Baseline:  Goal status: INITIAL  2.  L ankle AROM to be as follows: DF 10* Baseline:  Goal status: INITIAL  3.  Will score at least 20/24 on DGI  Baseline:  Goal status: INITIAL  4.  Will be able to ambulate community distances with LRAD, L ankle pain no more than 3/10 Baseline:  Goal status: INITIAL  5.  PSFS to have improved by 3 points  Baseline:  Goal status: INITIAL     PLAN:  PT FREQUENCY: 2x/week  PT DURATION: 8 weeks  PLANNED INTERVENTIONS: 97750- Physical Performance Testing, 97110-Therapeutic exercises, 97530- Therapeutic activity, [id number]- Neuromuscular re-education, 97535- Self Care, 02859- Manual therapy, 97116- Gait training, 97016- Vasopneumatic device, and Cryotherapy  PLAN FOR NEXT SESSION: WBAT per op notes, ROM and edema management, progressive loading and gait training as tolerated, progressive strengthening   Tanda Sorrow, PTA  12/27/23 8:00 AM

## 2024-01-01 DIAGNOSIS — H35721 Serous detachment of retinal pigment epithelium, right eye: Secondary | ICD-10-CM | POA: Diagnosis not present

## 2024-01-01 DIAGNOSIS — H25013 Cortical age-related cataract, bilateral: Secondary | ICD-10-CM | POA: Diagnosis not present

## 2024-01-01 DIAGNOSIS — H2513 Age-related nuclear cataract, bilateral: Secondary | ICD-10-CM | POA: Diagnosis not present

## 2024-01-01 DIAGNOSIS — H353211 Exudative age-related macular degeneration, right eye, with active choroidal neovascularization: Secondary | ICD-10-CM | POA: Diagnosis not present

## 2024-01-01 DIAGNOSIS — H35363 Drusen (degenerative) of macula, bilateral: Secondary | ICD-10-CM | POA: Diagnosis not present

## 2024-01-02 ENCOUNTER — Encounter: Payer: Self-pay | Admitting: Physical Therapy

## 2024-01-02 ENCOUNTER — Ambulatory Visit: Attending: Orthopaedic Surgery | Admitting: Physical Therapy

## 2024-01-02 DIAGNOSIS — R2681 Unsteadiness on feet: Secondary | ICD-10-CM | POA: Insufficient documentation

## 2024-01-02 DIAGNOSIS — R262 Difficulty in walking, not elsewhere classified: Secondary | ICD-10-CM | POA: Insufficient documentation

## 2024-01-02 DIAGNOSIS — R600 Localized edema: Secondary | ICD-10-CM | POA: Diagnosis not present

## 2024-01-02 DIAGNOSIS — M25672 Stiffness of left ankle, not elsewhere classified: Secondary | ICD-10-CM | POA: Insufficient documentation

## 2024-01-02 DIAGNOSIS — M25572 Pain in left ankle and joints of left foot: Secondary | ICD-10-CM | POA: Diagnosis not present

## 2024-01-02 NOTE — Therapy (Signed)
 OUTPATIENT PHYSICAL THERAPY LOWER EXTREMITY TREATMENT    Patient Name: Shelly Pearson MRN: [id number] DOB:October 11, 1940, 83 y.o., female Today's Date: 01/02/2024  END OF SESSION:  PT End of Session - 01/02/24 0859     Visit Number 3    Number of Visits 17    Date for PT Re-Evaluation 02/20/24    Authorization Type MCR and USAA    Authorization Time Period 12/26/23 to 02/20/24    Progress Note Due on Visit 10    PT Start Time 0848    PT Stop Time 0927    PT Time Calculation (min) 39 min    Activity Tolerance Patient tolerated treatment well    Behavior During Therapy [facility] for tasks assessed/performed           Past Medical History:  Diagnosis Date   B12 deficiency 03/02/2020   GERD (gastroesophageal reflux disease)    Hypertension    Seasonal allergies    Thyroid  disease    Urge incontinence of urine 12/10/2020   Urgency incontinence    Past Surgical History:  Procedure Laterality Date   ABDOMINAL HYSTERECTOMY     CESAREAN SECTION     CYSTOCELE REPAIR     HARDWARE REMOVAL Left 12/19/2023   Procedure: REMOVAL, HARDWARE;  Surgeon: Shelly Standing, MD;  Location: [facility];  Service: Orthopedics;  Laterality: Left;  LEFT  ANKLE REMOVAL OF HARDWARE   ORIF ANKLE FRACTURE Left 06/08/2023   Procedure: LEFT OPEN REDUCTION INTERNAL FIXATION (ORIF) ANKLE FRACTURE;  Surgeon: Shelly Standing, MD;  Location: [facility];  Service: Orthopedics;  Laterality: Left;   ROTATOR CUFF REPAIR Right    2018   Patient Active Problem List   Diagnosis Date Noted   Pain from implanted hardware 12/19/2023   Status post open reduction with internal fixation (ORIF) of fracture of ankle- Left 10/06/2023   Arthritis of midtarsal joint of left foot 10/06/2023   Exudative age-related macular degeneration, right eye, with active choroidal neovascularization (HCC) 05/24/2022   Decreased grip strength of right hand 09/16/2021   Gait disturbance 07/12/2021   Mild cognitive impairment  07/12/2021   Thyroid  goiter 12/10/2020   Mixed hyperlipidemia 12/10/2020   Degenerative cervical spinal stenosis, mild C4-C5 and C5-C6 12/10/2020   Lumbar degenerative disc disease, L4-L5 with left lateral recess stenosis 12/10/2020   Personal history of fall 09/01/2020   Congenital cavus deformity of both feet 03/02/2020   Metatarsalgia of both feet 03/02/2020   Hand arthritis 03/02/2020   Arthritis of carpometacarpal (CMC) joint of both thumbs 09/19/2019   Overactive bladder 09/19/2019   Sciatica 04/26/2019   Paresthesia of both feet 02/04/2019   Epidermal cyst 04/17/2018   Allergic rhinitis 06/23/2015   Eczema 06/23/2015   Gastro-esophageal reflux disease without esophagitis 06/23/2015   Essential hypertension 06/23/2015   Hypothyroidism 06/23/2015    PCP: Shelly Senior MD   REFERRING PROVIDER: Genelle Standing, MD  REFERRING DIAG:  Diagnosis  [phone] (ICD-10-CM) - Bimalleolar ankle fracture, left, closed, initial encounter    THERAPY DIAG:  Stiffness of left ankle, not elsewhere classified  Pain in left ankle and joints of left foot  Localized edema  Difficulty in walking, not elsewhere classified  Unsteadiness on feet  Rationale for Evaluation and Treatment: Rehabilitation  ONSET DATE: L ankle ORIF 06/2023, hardware removal 12/19/23  SUBJECTIVE:   SUBJECTIVE STATEMENT:    Doing good, see the doctor tomorrow. Still not putting weight on it, I want to be careful.   EVAL:I was feeling pretty good  after I finished PT, after that the right ankle I still had a lot of swelling and issues getting into shoes. It stayed tender and gave me some shooting pains down into the foot. Dr. Genelle said that it might help to take out the hardware. I have been careful with it, I know I can put weight on it but being careful when it hurts.  PERTINENT HISTORY:  See above in addition to the following from MD note:   6 months status post left ankle open reduction internal  fixation. She is somewhat symptomatic about her medial malleolar screws. She is also having symptoms about her peroneal tendon insertions at the plantar aspect of the foot as well as issues with neuropathy which she is having on the other side as well. Given this I do believe she would benefit from a custom orthotic. I will plan to see her back as needed should she want to consider medial malleolar screw removal PAIN:  Are you having pain? Yes: NPRS scale: 1/10 Pain location: medial and lateral L ankle  Pain description: soreness and pulling  Aggravating factors: pressure/WB  Relieving factors: keeping it still and elevating   PRECAUTIONS: None  RED FLAGS: None   WEIGHT BEARING RESTRICTIONS:  weightbearing as tolerated per op note   FALLS:  Has patient fallen in last 6 months? No  LIVING ENVIRONMENT: Lives with: lives with their family Lives in: House/apartment Stairs: 4STE with rails, has second floor (steps with split landing) Has following equipment at home: Vannie - 2 wheeled and Wheelchair (manual)  OCCUPATION: retired- Microbiologist and flight attendant   PLOF: Independent, Independent with basic ADLs, Independent with gait, and Independent with transfers  PATIENT GOALS: be able to get around without sharp pains   NEXT MD VISIT: Dr. Genelle 01/03/24  OBJECTIVE:  Note: Objective measures were completed at Evaluation unless otherwise noted.    PATIENT SURVEYS:   THE PATIENT SPECIFIC FUNCTIONAL SCALE  Place score of 0-10 (0 = unable to perform activity and 10 = able to perform activity at the same level as before injury or problem)  Activity Date: Eval 12/26/23    Grocery shopping  0    2. Cleaning house  5    3. Cooking  2    4.      Total Score 2.3      Total Score = Sum of activity scores/number of activities  Minimally Detectable Change: 3 points (for single activity); 2 points (for average score)  [name] (nd). The Patient  Specific Functional Scale . Retrieved from [email or web address]   COGNITION: Overall cognitive status: hx of STM impairment      SENSATION: Not tested  EDEMA:   Appropriate for post-op status, good capillary refill       LOWER EXTREMITY ROM:  Active ROM Left eval Left 01/02/24  Hip flexion    Hip extension    Hip abduction    Hip adduction    Hip internal rotation    Hip external rotation    Knee flexion    Knee extension    Ankle dorsiflexion -25* -10*  Ankle plantarflexion 58* 61*  Ankle inversion 21* 26*  Ankle eversion 5* 7*   (Blank rows = not tested)  LOWER EXTREMITY MMT:  MMT Right eval Left eval  Hip flexion    Hip extension    Hip abduction    Hip adduction    Hip internal rotation    Hip external rotation  Knee flexion    Knee extension    Ankle dorsiflexion  3-  Ankle plantarflexion  3-  Ankle inversion    Ankle eversion     (Blank rows = not tested)   GAIT: Distance walked: deferred today due to pain/pulling at surgical site   Able to transfer and wt shift well with RW                                                                                                                                 TREATMENT DATE:   01/02/24  Seated rocker board PF/DF 15x3 seconds Seated rocker board inversion/eversion 15x3 seconds  Seated BAPs board circles CW/CCW 2x10 each way PROM/stretching all directions to tolerance  ROM update Supine SAQs 3# 2x12 L  SLRs 0# 2x15  Sidelying hip ABD x10 B 0# LAQs 0# x10 Pearson wt shifts to L LE x15   12/27/23 L ankle PROM w/ enf range holds  Grade 1 jt mobs L ankle AROM PR, DF, Iev and EX x10 4 way ankle Tband  red 2x15 Ankle ROM on Fitter gear x10 Weight shifts  Small marches   12/26/23  Eval, POC, HEP and education as below     PATIENT EDUCATION:  Education details: exam findings, POC, HEP, encouraged WBAT, ice, elevation  Person educated:  Patient Education method: Explanation, Demonstration, and Handouts Education comprehension: verbalized understanding, returned demonstration, and needs further education  HOME EXERCISE PROGRAM:  Access Code: [email or web address] URL: [email or web address] Date: 01/02/2024 Prepared by: Josette Rough  Exercises - Supine Ankle Pumps in Elevation on Pillows  - 2-3 x daily - 7 x weekly - 1 sets - 10 reps - Ankle Circles in Elevation  - 2-3 x daily - 7 x weekly - 1 sets - 10 reps - Ankle Alphabet in Elevation  - 2-3 x daily - 7 x weekly - 1 sets - 10 reps - Lateral Weight Shift with Parallel Bars (BKA)  - 1 x daily - 7 x weekly - 1 sets - 10 reps - 2 seconds  hold - Supine Active Straight Leg Raise  - 1 x daily - 7 x weekly - 2 sets - 10 reps - Sidelying Hip Abduction  - 1 x daily - 7 x weekly - 2 sets - 10 reps - Seated Long Arc Quad  - 1 x daily - 7 x weekly - 2 sets - 10 reps  ASSESSMENT:  CLINICAL IMPRESSION:  Kept working on progressing ROM and WB tolerance as tolerated and as pt was agreeable to today. She is still a bit hesitant to WB even with WBAT orders from MD- sees him tomorrow for post-op f/u, updated ROM and routed today's note to his office. Updated HEP as appropriate today, really need to get her using L LE more in general at home.   OBJECTIVE IMPAIRMENTS: Abnormal gait, decreased activity tolerance, decreased balance, decreased knowledge of use of DME, decreased mobility, difficulty walking,  decreased ROM, decreased strength, and pain.   ACTIVITY LIMITATIONS: Pearson, stairs, transfers, and locomotion level  PARTICIPATION LIMITATIONS: meal prep, cleaning, laundry, driving, shopping, and community activity  PERSONAL FACTORS: Behavior pattern, Education, Fitness, Social background, and Time since onset of injury/illness/exacerbation are also affecting patient's functional outcome.   REHAB POTENTIAL: Good  CLINICAL DECISION MAKING: Stable/uncomplicated  EVALUATION  COMPLEXITY: Low   GOALS: Goals reviewed with patient? No  SHORT TERM GOALS: Target date: 01/23/2024   Will be compliant with appropriate progressive HEP  Baseline: Goal status: INITIAL  2.  L ankle AROM to improve to: -10* DF, PF to 65*, eversion to 12* Baseline:  Goal status: ONGOING 01/02/24 see above   3.  Will be independent with appropriate edema management techniques  Baseline:  Goal status: INITIAL  4.  Will be consistently ambulatory for household distances with LRAD  Baseline:  Goal status: INITIAL    LONG TERM GOALS: Target date: 02/20/2024    MMT to be at least 4/5 L ankle  Baseline:  Goal status: INITIAL  2.  L ankle AROM to be as follows: DF 10* Baseline:  Goal status: INITIAL  3.  Will score at least 20/24 on DGI  Baseline:  Goal status: INITIAL  4.  Will be able to ambulate community distances with LRAD, L ankle pain no more than 3/10 Baseline:  Goal status: INITIAL  5.  PSFS to have improved by 3 points  Baseline:  Goal status: INITIAL     PLAN:  PT FREQUENCY: 2x/week  PT DURATION: 8 weeks  PLANNED INTERVENTIONS: 97750- Physical Performance Testing, 97110-Therapeutic exercises, 97530- Therapeutic activity, [id number]- Neuromuscular re-education, 97535- Self Care, [id number]- Manual therapy, 97116- Gait training, 97016- Vasopneumatic device, and Cryotherapy  PLAN FOR NEXT SESSION: WBAT per op notes, ROM and edema management, progressive loading and gait training as tolerated, progressive strengthening. What did MD say at f/u visit?   Josette Rough, PT, DPT 01/02/24 9:27 AM

## 2024-01-03 ENCOUNTER — Ambulatory Visit (HOSPITAL_BASED_OUTPATIENT_CLINIC_OR_DEPARTMENT_OTHER): Admitting: Orthopaedic Surgery

## 2024-01-03 DIAGNOSIS — S82842A Displaced bimalleolar fracture of left lower leg, initial encounter for closed fracture: Secondary | ICD-10-CM

## 2024-01-03 NOTE — Progress Notes (Signed)
 Post Operative Evaluation    Procedure/Date of Surgery: Left ankle removal of hardware 6/17  Interval History:   Presents 2 weeks status post the above procedure.  Overall she is doing well.  She has been careful with walking longer distances   PMH/PSH/Family History/Social History/Meds/Allergies:    Past Medical History:  Diagnosis Date   B12 deficiency 03/02/2020   GERD (gastroesophageal reflux disease)    Hypertension    Seasonal allergies    Thyroid  disease    Urge incontinence of urine 12/10/2020   Urgency incontinence    Past Surgical History:  Procedure Laterality Date   ABDOMINAL HYSTERECTOMY     CESAREAN SECTION     CYSTOCELE REPAIR     HARDWARE REMOVAL Left 12/19/2023   Procedure: REMOVAL, HARDWARE;  Surgeon: Genelle Standing, MD;  Location: [facility];  Service: Orthopedics;  Laterality: Left;  LEFT  ANKLE REMOVAL OF HARDWARE   ORIF ANKLE FRACTURE Left 06/08/2023   Procedure: LEFT OPEN REDUCTION INTERNAL FIXATION (ORIF) ANKLE FRACTURE;  Surgeon: Genelle Standing, MD;  Location: [facility];  Service: Orthopedics;  Laterality: Left;   ROTATOR CUFF REPAIR Right    2018   Social History   Socioeconomic History   Marital status: Married    Spouse name: Not on file   Number of children: Not on file   Years of education: Not on file   Highest education level: Bachelor's degree (e.g., BA, AB, BS)  Occupational History   Occupation: Retired  Tobacco Use   Smoking status: Never   Smokeless tobacco: Never  Vaping Use   Vaping status: Never Used  Substance and Sexual Activity   Alcohol use: Yes    Comment: very rare    Drug use: No   Sexual activity: Not Currently  Other Topics Concern   Not on file  Social History Narrative   Lives with husband   Right handed   1 cup tea every morning   Social Drivers of Health   Financial Resource Strain: Low Risk  (10/02/2023)   Overall Financial Resource Strain (CARDIA)     Difficulty of Paying Living Expenses: Not hard at all  Food Insecurity: No Food Insecurity (10/02/2023)   Hunger Vital Sign    Worried About Running Out of Food in the Last Year: Never true    Ran Out of Food in the Last Year: Never true  Transportation Needs: No Transportation Needs (10/02/2023)   PRAPARE - Administrator, Civil Service (Medical): No    Lack of Transportation (Non-Medical): No  Physical Activity: Unknown (10/02/2023)   Exercise Vital Sign    Days of Exercise per Week: Patient declined    Minutes of Exercise per Session: Not on file  Stress: Stress Concern Present (10/02/2023)   [employer] of Occupational Health - Occupational Stress Questionnaire    Feeling of Stress : To some extent  Social Connections: Socially Integrated (10/02/2023)   Social Connection and Isolation Panel    Frequency of Communication with Friends and Family: More than three times a week    Frequency of Social Gatherings with Friends and Family: Three times a week    Attends Religious Services: More than 4 times per year    Active Member of Clubs or Organizations: Yes    Attends Banker Meetings:  More than 4 times per year    Marital Status: Married   Family History  Problem Relation Age of Onset   Stroke Mother    Cancer Father    Cancer Sister    Cancer Brother    Allergies  Allergen Reactions   Adhesive  [Tape] Rash   Current Outpatient Medications  Medication Sig Dispense Refill   atorvastatin  (LIPITOR) 20 MG tablet TAKE 1 TABLET DAILY 90 tablet 3   Biotin 10 MG CAPS Take 10 mg by mouth daily.     donepezil (ARICEPT) 5 MG tablet Take 1 tablet (5 mg total) by mouth at bedtime. 90 tablet 3   levothyroxine  (SYNTHROID ) 50 MCG tablet TAKE 1 TABLET DAILY BEFORE BREAKFAST 90 tablet 3   losartan  (COZAAR ) 25 MG tablet TAKE 1 TABLET DAILY 90 tablet 3   Multiple Vitamins-Minerals (MATURE ADULT CENTURY PO) Take 1 tablet by mouth daily.     Polyethyl Glycol-Propyl  Glycol (SYSTANE OP) Place 2 drops into both eyes 3 (three) times daily.     trospium  (SANCTURA ) 20 MG tablet TAKE 1 TABLET AT BEDTIME 90 tablet 3   No current facility-administered medications for this visit.   No results found.  Review of Systems:   A ROS was performed including pertinent positives and negatives as documented in the HPI.   Musculoskeletal Exam:    There were no vitals taken for this visit.  Left medial ankle wound is well-healed without erythema or drainage.  Sensation is intact distally.  There is mild swelling  Imaging:      I personally reviewed and interpreted the radiographs.   Assessment:   2 weeks status post left ankle removal of hardware overall doing very well.  At this time she will be activity as tolerated.  I will plan to see her back as needed  Plan :    - Return to clinic as needed      I personally saw and evaluated the patient, and participated in the management and treatment plan.  Elspeth Parker, MD Attending Physician, Orthopedic Surgery  This document was dictated using Dragon voice recognition software. A reasonable attempt at proof reading has been made to minimize errors.

## 2024-01-08 ENCOUNTER — Ambulatory Visit: Admitting: Physical Therapy

## 2024-01-08 ENCOUNTER — Encounter: Payer: Self-pay | Admitting: Physical Therapy

## 2024-01-08 DIAGNOSIS — R2681 Unsteadiness on feet: Secondary | ICD-10-CM

## 2024-01-08 DIAGNOSIS — R600 Localized edema: Secondary | ICD-10-CM

## 2024-01-08 DIAGNOSIS — M25672 Stiffness of left ankle, not elsewhere classified: Secondary | ICD-10-CM | POA: Diagnosis not present

## 2024-01-08 DIAGNOSIS — M25572 Pain in left ankle and joints of left foot: Secondary | ICD-10-CM | POA: Diagnosis not present

## 2024-01-08 DIAGNOSIS — R262 Difficulty in walking, not elsewhere classified: Secondary | ICD-10-CM | POA: Diagnosis not present

## 2024-01-08 NOTE — Therapy (Signed)
 OUTPATIENT PHYSICAL THERAPY LOWER EXTREMITY TREATMENT    Patient Name: Shelly Pearson MRN: [id number] DOB:1940/08/23, 83 y.o., female Today's Date: 01/08/2024  END OF SESSION:  PT End of Session - 01/08/24 0925     Visit Number 4    Date for PT Re-Evaluation 02/20/24    PT Start Time 0925    PT Stop Time 1010    PT Time Calculation (min) 45 min    Activity Tolerance Patient tolerated treatment well    Behavior During Therapy [facility] for tasks assessed/performed           Past Medical History:  Diagnosis Date   B12 deficiency 03/02/2020   GERD (gastroesophageal reflux disease)    Hypertension    Seasonal allergies    Thyroid  disease    Urge incontinence of urine 12/10/2020   Urgency incontinence    Past Surgical History:  Procedure Laterality Date   ABDOMINAL HYSTERECTOMY     CESAREAN SECTION     CYSTOCELE REPAIR     HARDWARE REMOVAL Left 12/19/2023   Procedure: REMOVAL, HARDWARE;  Surgeon: Genelle Standing, MD;  Location: [facility];  Service: Orthopedics;  Laterality: Left;  LEFT  ANKLE REMOVAL OF HARDWARE   ORIF ANKLE FRACTURE Left 06/08/2023   Procedure: LEFT OPEN REDUCTION INTERNAL FIXATION (ORIF) ANKLE FRACTURE;  Surgeon: Genelle Standing, MD;  Location: [facility];  Service: Orthopedics;  Laterality: Left;   ROTATOR CUFF REPAIR Right    2018   Patient Active Problem List   Diagnosis Date Noted   Pain from implanted hardware 12/19/2023   Status post open reduction with internal fixation (ORIF) of fracture of ankle- Left 10/06/2023   Arthritis of midtarsal joint of left foot 10/06/2023   Exudative age-related macular degeneration, right eye, with active choroidal neovascularization (HCC) 05/24/2022   Decreased grip strength of right hand 09/16/2021   Gait disturbance 07/12/2021   Mild cognitive impairment 07/12/2021   Thyroid  goiter 12/10/2020   Mixed hyperlipidemia 12/10/2020   Degenerative cervical spinal stenosis, mild C4-C5 and C5-C6 12/10/2020    Lumbar degenerative disc disease, L4-L5 with left lateral recess stenosis 12/10/2020   Personal history of fall 09/01/2020   Congenital cavus deformity of both feet 03/02/2020   Metatarsalgia of both feet 03/02/2020   Hand arthritis 03/02/2020   Arthritis of carpometacarpal (CMC) joint of both thumbs 09/19/2019   Overactive bladder 09/19/2019   Sciatica 04/26/2019   Paresthesia of both feet 02/04/2019   Epidermal cyst 04/17/2018   Allergic rhinitis 06/23/2015   Eczema 06/23/2015   Gastro-esophageal reflux disease without esophagitis 06/23/2015   Essential hypertension 06/23/2015   Hypothyroidism 06/23/2015    PCP: Thedora Senior MD   REFERRING PROVIDER: Genelle Standing, MD  REFERRING DIAG:  Diagnosis  [phone] (ICD-10-CM) - Bimalleolar ankle fracture, left, closed, initial encounter    THERAPY DIAG:  Stiffness of left ankle, not elsewhere classified  Pain in left ankle and joints of left foot  Difficulty in walking, not elsewhere classified  Localized edema  Unsteadiness on feet  Rationale for Evaluation and Treatment: Rehabilitation  ONSET DATE: L ankle ORIF 06/2023, hardware removal 12/19/23  SUBJECTIVE:   SUBJECTIVE STATEMENT:   He said I can do whatever feel comfortable It is better  EVAL:I was feeling pretty good after I finished PT, after that the right ankle I still had a lot of swelling and issues getting into shoes. It stayed tender and gave me some shooting pains down into the foot. Dr. Genelle said that it might help  to take out the hardware. I have been careful with it, I know I can put weight on it but being careful when it hurts.  PERTINENT HISTORY:  See above in addition to the following from MD note:   6 months status post left ankle open reduction internal fixation. She is somewhat symptomatic about her medial malleolar screws. She is also having symptoms about her peroneal tendon insertions at the plantar aspect of the foot as well as issues  with neuropathy which she is having on the other side as well. Given this I do believe she would benefit from a custom orthotic. I will plan to see her back as needed should she want to consider medial malleolar screw removal PAIN:  Are you having pain? Yes: NPRS scale: 0/10 Pain location: medial and lateral L ankle  Pain description: soreness and pulling  Aggravating factors: pressure/WB  Relieving factors: keeping it still and elevating   PRECAUTIONS: None  RED FLAGS: None   WEIGHT BEARING RESTRICTIONS:  weightbearing as tolerated per op note   FALLS:  Has patient fallen in last 6 months? No  LIVING ENVIRONMENT: Lives with: lives with their family Lives in: House/apartment Stairs: 4STE with rails, has second floor (steps with split landing) Has following equipment at home: Vannie - 2 wheeled and Wheelchair (manual)  OCCUPATION: retired- Microbiologist and flight attendant   PLOF: Independent, Independent with basic ADLs, Independent with gait, and Independent with transfers  PATIENT GOALS: be able to get around without sharp pains   NEXT MD VISIT: Dr. Genelle 01/03/24  OBJECTIVE:  Note: Objective measures were completed at Evaluation unless otherwise noted.    PATIENT SURVEYS:   THE PATIENT SPECIFIC FUNCTIONAL SCALE  Place score of 0-10 (0 = unable to perform activity and 10 = able to perform activity at the same level as before injury or problem)  Activity Date: Eval 12/26/23    Grocery shopping  0    2. Cleaning house  5    3. Cooking  2    4.      Total Score 2.3      Total Score = Sum of activity scores/number of activities  Minimally Detectable Change: 3 points (for single activity); 2 points (for average score)  [name] (nd). The Patient Specific Functional Scale . Retrieved from [email or web address]   COGNITION: Overall cognitive status: hx of STM impairment       SENSATION: Not tested  EDEMA:   Appropriate for post-op status, good capillary refill       LOWER EXTREMITY ROM:  Active ROM Left eval Left 01/02/24  Hip flexion    Hip extension    Hip abduction    Hip adduction    Hip internal rotation    Hip external rotation    Knee flexion    Knee extension    Ankle dorsiflexion -25* -10*  Ankle plantarflexion 58* 61*  Ankle inversion 21* 26*  Ankle eversion 5* 7*   (Blank rows = not tested)  LOWER EXTREMITY MMT:  MMT Right eval Left eval  Hip flexion    Hip extension    Hip abduction    Hip adduction    Hip internal rotation    Hip external rotation    Knee flexion    Knee extension    Ankle dorsiflexion  3-  Ankle plantarflexion  3-  Ankle inversion    Ankle eversion     (Blank rows = not tested)  GAIT: Distance walked: deferred today due to pain/pulling at surgical site   Able to transfer and wt shift well with RW                                                                                                                                 TREATMENT DATE:  01/08/24 NuStep L 4 x 7  min Alt 4in box taps 2x10 L ankle PROM w/ end range holds  4 way ankle red x15 Sit to stand 2x10 4in step ups at stairs HS curls 20lb 2x10 Leg Ext 5lb 2x10  Slant board calf stretch  Side step on and off airex  01/02/24  Seated rocker board PF/DF 15x3 seconds Seated rocker board inversion/eversion 15x3 seconds  Seated BAPs board circles CW/CCW 2x10 each way PROM/stretching all directions to tolerance  ROM update Supine SAQs 3# 2x12 L  SLRs 0# 2x15  Sidelying hip ABD x10 B 0# LAQs 0# x10 Standing wt shifts to L LE x15   12/27/23 L ankle PROM w/ enf range holds  Grade 1 jt mobs L ankle AROM PR, DF, Iev and EX x10 4 way ankle Tband  red 2x15 Ankle ROM on Fitter gear x10 Weight shifts  Small marches   12/26/23  Eval, POC, HEP and education as below     PATIENT EDUCATION:  Education details: exam  findings, POC, HEP, encouraged WBAT, ice, elevation  Person educated: Patient Education method: Explanation, Demonstration, and Handouts Education comprehension: verbalized understanding, returned demonstration, and needs further education  HOME EXERCISE PROGRAM:  Access Code: [email or web address] URL: [email or web address] Date: 01/02/2024 Prepared by: Josette Rough  Exercises - Supine Ankle Pumps in Elevation on Pillows  - 2-3 x daily - 7 x weekly - 1 sets - 10 reps - Ankle Circles in Elevation  - 2-3 x daily - 7 x weekly - 1 sets - 10 reps - Ankle Alphabet in Elevation  - 2-3 x daily - 7 x weekly - 1 sets - 10 reps - Lateral Weight Shift with Parallel Bars (BKA)  - 1 x daily - 7 x weekly - 1 sets - 10 reps - 2 seconds  hold - Supine Active Straight Leg Raise  - 1 x daily - 7 x weekly - 2 sets - 10 reps - Sidelying Hip Abduction  - 1 x daily - 7 x weekly - 2 sets - 10 reps - Seated Long Arc Quad  - 1 x daily - 7 x weekly - 2 sets - 10 reps  ASSESSMENT:  CLINICAL IMPRESSION:  Pt returns form MD stating she was able complete activities as tolerated. She enters ambulating without AD reporting minium pain. Session focused more on weight bearing and functional activities. LE weakness and fatigue reported with sit to stands and other isolated LE interventions. CGA needed with at times with step ups interventions. Will progress as tolerated.  OBJECTIVE IMPAIRMENTS: Abnormal gait, decreased activity tolerance, decreased balance, decreased knowledge  of use of DME, decreased mobility, difficulty walking, decreased ROM, decreased strength, and pain.   ACTIVITY LIMITATIONS: standing, stairs, transfers, and locomotion level  PARTICIPATION LIMITATIONS: meal prep, cleaning, laundry, driving, shopping, and community activity  PERSONAL FACTORS: Behavior pattern, Education, Fitness, Social background, and Time since onset of injury/illness/exacerbation are also affecting patient's functional  outcome.   REHAB POTENTIAL: Good  CLINICAL DECISION MAKING: Stable/uncomplicated  EVALUATION COMPLEXITY: Low   GOALS: Goals reviewed with patient? No  SHORT TERM GOALS: Target date: 01/23/2024   Will be compliant with appropriate progressive HEP  Baseline: Goal status: INITIAL  2.  L ankle AROM to improve to: -10* DF, PF to 65*, eversion to 12* Baseline:  Goal status: ONGOING 01/02/24 see above   3.  Will be independent with appropriate edema management techniques  Baseline:  Goal status: INITIAL  4.  Will be consistently ambulatory for household distances with LRAD  Baseline:  Goal status: INITIAL    LONG TERM GOALS: Target date: 02/20/2024    MMT to be at least 4/5 L ankle  Baseline:  Goal status: INITIAL  2.  L ankle AROM to be as follows: DF 10* Baseline:  Goal status: INITIAL  3.  Will score at least 20/24 on DGI  Baseline:  Goal status: INITIAL  4.  Will be able to ambulate community distances with LRAD, L ankle pain no more than 3/10 Baseline:  Goal status: INITIAL  5.  PSFS to have improved by 3 points  Baseline:  Goal status: INITIAL     PLAN:  PT FREQUENCY: 2x/week  PT DURATION: 8 weeks  PLANNED INTERVENTIONS: 97750- Physical Performance Testing, 97110-Therapeutic exercises, 97530- Therapeutic activity, [id number]- Neuromuscular re-education, 97535- Self Care, [id number]- Manual therapy, 97116- Gait training, 97016- Vasopneumatic device, and Cryotherapy  PLAN FOR NEXT SESSION: WBAT per op notes, ROM and edema management, progressive loading and gait training as tolerated, progressive strengthening. Tanda Sorrow, PTA  01/08/24 9:25 AM

## 2024-01-10 ENCOUNTER — Ambulatory Visit: Admitting: Physical Therapy

## 2024-01-10 ENCOUNTER — Ambulatory Visit

## 2024-01-10 ENCOUNTER — Encounter: Payer: Self-pay | Admitting: Physical Therapy

## 2024-01-10 VITALS — Ht 64.0 in | Wt 134.0 lb

## 2024-01-10 DIAGNOSIS — R600 Localized edema: Secondary | ICD-10-CM | POA: Diagnosis not present

## 2024-01-10 DIAGNOSIS — M25672 Stiffness of left ankle, not elsewhere classified: Secondary | ICD-10-CM | POA: Diagnosis not present

## 2024-01-10 DIAGNOSIS — M25572 Pain in left ankle and joints of left foot: Secondary | ICD-10-CM | POA: Diagnosis not present

## 2024-01-10 DIAGNOSIS — Z0000 Encounter for general adult medical examination without abnormal findings: Secondary | ICD-10-CM

## 2024-01-10 DIAGNOSIS — R262 Difficulty in walking, not elsewhere classified: Secondary | ICD-10-CM | POA: Diagnosis not present

## 2024-01-10 DIAGNOSIS — R2681 Unsteadiness on feet: Secondary | ICD-10-CM

## 2024-01-10 NOTE — Therapy (Signed)
 OUTPATIENT PHYSICAL THERAPY LOWER EXTREMITY TREATMENT    Patient Name: Shelly Pearson MRN: [id number] DOB:02-Aug-1940, 83 y.o., female Today's Date: 01/10/2024  END OF SESSION:  PT End of Session - 01/10/24 0844     Visit Number 5    Date for PT Re-Evaluation 02/20/24    PT Start Time 0845    PT Stop Time 0930    PT Time Calculation (min) 45 min    Activity Tolerance Patient tolerated treatment well    Behavior During Therapy [facility] for tasks assessed/performed           Past Medical History:  Diagnosis Date   B12 deficiency 03/02/2020   GERD (gastroesophageal reflux disease)    Hypertension    Seasonal allergies    Thyroid  disease    Urge incontinence of urine 12/10/2020   Urgency incontinence    Past Surgical History:  Procedure Laterality Date   ABDOMINAL HYSTERECTOMY     CESAREAN SECTION     CYSTOCELE REPAIR     HARDWARE REMOVAL Left 12/19/2023   Procedure: REMOVAL, HARDWARE;  Surgeon: Genelle Standing, MD;  Location: [facility];  Service: Orthopedics;  Laterality: Left;  LEFT  ANKLE REMOVAL OF HARDWARE   ORIF ANKLE FRACTURE Left 06/08/2023   Procedure: LEFT OPEN REDUCTION INTERNAL FIXATION (ORIF) ANKLE FRACTURE;  Surgeon: Genelle Standing, MD;  Location: [facility];  Service: Orthopedics;  Laterality: Left;   ROTATOR CUFF REPAIR Right    2018   Patient Active Problem List   Diagnosis Date Noted   Pain from implanted hardware 12/19/2023   Status post open reduction with internal fixation (ORIF) of fracture of ankle- Left 10/06/2023   Arthritis of midtarsal joint of left foot 10/06/2023   Exudative age-related macular degeneration, right eye, with active choroidal neovascularization (HCC) 05/24/2022   Decreased grip strength of right hand 09/16/2021   Gait disturbance 07/12/2021   Mild cognitive impairment 07/12/2021   Thyroid  goiter 12/10/2020   Mixed hyperlipidemia 12/10/2020   Degenerative cervical spinal stenosis, mild C4-C5 and C5-C6 12/10/2020    Lumbar degenerative disc disease, L4-L5 with left lateral recess stenosis 12/10/2020   Personal history of fall 09/01/2020   Congenital cavus deformity of both feet 03/02/2020   Metatarsalgia of both feet 03/02/2020   Hand arthritis 03/02/2020   Arthritis of carpometacarpal (CMC) joint of both thumbs 09/19/2019   Overactive bladder 09/19/2019   Sciatica 04/26/2019   Paresthesia of both feet 02/04/2019   Epidermal cyst 04/17/2018   Allergic rhinitis 06/23/2015   Eczema 06/23/2015   Gastro-esophageal reflux disease without esophagitis 06/23/2015   Essential hypertension 06/23/2015   Hypothyroidism 06/23/2015    PCP: Thedora Senior MD   REFERRING PROVIDER: Genelle Standing, MD  REFERRING DIAG:  Diagnosis  [phone] (ICD-10-CM) - Bimalleolar ankle fracture, left, closed, initial encounter    THERAPY DIAG:  Stiffness of left ankle, not elsewhere classified  Pain in left ankle and joints of left foot  Difficulty in walking, not elsewhere classified  Localized edema  Unsteadiness on feet  Rationale for Evaluation and Treatment: Rehabilitation  ONSET DATE: L ankle ORIF 06/2023, hardware removal 12/19/23  SUBJECTIVE:   SUBJECTIVE STATEMENT:   Its sore  swelling still,  had referred to a podiatrist   EVAL:I was feeling pretty good after I finished PT, after that the right ankle I still had a lot of swelling and issues getting into shoes. It stayed tender and gave me some shooting pains down into the foot. Dr. Genelle said that it might  help to take out the hardware. I have been careful with it, I know I can put weight on it but being careful when it hurts.  PERTINENT HISTORY:  See above in addition to the following from MD note:   6 months status post left ankle open reduction internal fixation. She is somewhat symptomatic about her medial malleolar screws. She is also having symptoms about her peroneal tendon insertions at the plantar aspect of the foot as well as issues  with neuropathy which she is having on the other side as well. Given this I do believe she would benefit from a custom orthotic. I will plan to see her back as needed should she want to consider medial malleolar screw removal PAIN:  Are you having pain? Yes: NPRS scale: 2/10 Pain location: medial and lateral L ankle  Pain description: soreness and pulling  Aggravating factors: pressure/WB  Relieving factors: keeping it still and elevating   PRECAUTIONS: None  RED FLAGS: None   WEIGHT BEARING RESTRICTIONS:  weightbearing as tolerated per op note   FALLS:  Has patient fallen in last 6 months? No  LIVING ENVIRONMENT: Lives with: lives with their family Lives in: House/apartment Stairs: 4STE with rails, has second floor (steps with split landing) Has following equipment at home: Vannie - 2 wheeled and Wheelchair (manual)  OCCUPATION: retired- Microbiologist and flight attendant   PLOF: Independent, Independent with basic ADLs, Independent with gait, and Independent with transfers  PATIENT GOALS: be able to get around without sharp pains   NEXT MD VISIT: Dr. Genelle 01/03/24  OBJECTIVE:  Note: Objective measures were completed at Evaluation unless otherwise noted.    PATIENT SURVEYS:   THE PATIENT SPECIFIC FUNCTIONAL SCALE  Place score of 0-10 (0 = unable to perform activity and 10 = able to perform activity at the same level as before injury or problem)  Activity Date: Eval 12/26/23    Grocery shopping  0    2. Cleaning house  5    3. Cooking  2    4.      Total Score 2.3      Total Score = Sum of activity scores/number of activities  Minimally Detectable Change: 3 points (for single activity); 2 points (for average score)  [name] (nd). The Patient Specific Functional Scale . Retrieved from [email or web address]   COGNITION: Overall cognitive status: hx of STM impairment       SENSATION: Not tested  EDEMA:   Appropriate for post-op status, good capillary refill       LOWER EXTREMITY ROM:  Active ROM Left eval Left 01/02/24 Left 01/10/24  Hip flexion     Hip extension     Hip abduction     Hip adduction     Hip internal rotation     Hip external rotation     Knee flexion     Knee extension     Ankle dorsiflexion -25* -10* 0  Ankle plantarflexion 58* 61* 45  Ankle inversion 21* 26* 29  Ankle eversion 5* 7* 19   (Blank rows = not tested)  LOWER EXTREMITY MMT:  MMT Right eval Left eval  Hip flexion    Hip extension    Hip abduction    Hip adduction    Hip internal rotation    Hip external rotation    Knee flexion    Knee extension    Ankle dorsiflexion  3-  Ankle plantarflexion  3-  Ankle inversion  Ankle eversion     (Blank rows = not tested)   GAIT: Distance walked: deferred today due to pain/pulling at surgical site   Able to transfer and wt shift well with RW                                                                                                                                 TREATMENT DATE:  01/10/24 NuStep L5 x 6 min Slant board Calf stretch 6in step ups x10 each  GOALS 4 way ankle Green x15 L ankle PROM w/ end range holds   STM to L plantar fascia Sit to stand LE on airex 2x10 Heel raises on airex  HS curls 20lb 2x12 Leg Ext 5lb 2x12   01/08/24 NuStep L 4 x 7  min Alt 4in box taps 2x10 L ankle PROM w/ end range holds  4 way ankle red x15 Sit to stand 2x10 4in step ups at stairs HS curls 20lb 2x10 Leg Ext 5lb 2x10  Slant board calf stretch  Side step on and off airex  01/02/24  Seated rocker board PF/DF 15x3 seconds Seated rocker board inversion/eversion 15x3 seconds  Seated BAPs board circles CW/CCW 2x10 each way PROM/stretching all directions to tolerance  ROM update Supine SAQs 3# 2x12 L  SLRs 0# 2x15  Sidelying hip ABD x10 B 0# LAQs 0# x10 Standing wt shifts to L LE  x15   12/27/23 L ankle PROM w/ enf range holds  Grade 1 jt mobs L ankle AROM PR, DF, Iev and EX x10 4 way ankle Tband  red 2x15 Ankle ROM on Fitter gear x10 Weight shifts  Small marches   12/26/23  Eval, POC, HEP and education as below     PATIENT EDUCATION:  Education details: exam findings, POC, HEP, encouraged WBAT, ice, elevation  Person educated: Patient Education method: Explanation, Demonstration, and Handouts Education comprehension: verbalized understanding, returned demonstration, and needs further education  HOME EXERCISE PROGRAM:  Access Code: [email or web address] URL: [email or web address] Date: 01/02/2024 Prepared by: Josette Rough  Exercises - Supine Ankle Pumps in Elevation on Pillows  - 2-3 x daily - 7 x weekly - 1 sets - 10 reps - Ankle Circles in Elevation  - 2-3 x daily - 7 x weekly - 1 sets - 10 reps - Ankle Alphabet in Elevation  - 2-3 x daily - 7 x weekly - 1 sets - 10 reps - Lateral Weight Shift with Parallel Bars (BKA)  - 1 x daily - 7 x weekly - 1 sets - 10 reps - 2 seconds  hold - Supine Active Straight Leg Raise  - 1 x daily - 7 x weekly - 2 sets - 10 reps - Sidelying Hip Abduction  - 1 x daily - 7 x weekly - 2 sets - 10 reps - Seated Long Arc Quad  - 1 x daily - 7 x weekly - 2 sets - 10 reps  ASSESSMENT:  CLINICAL IMPRESSION:  Again she enters ambulating without AD reporting minium pain. Session focused more on weight bearing and functional activities. Objective measures taken and she has progressed towards goals. LE weakness and fatigue reported with sit to stands . Increase resistance tolerated with 4 wat ankle PREs.  CGA needed with at times with step ups interventions. Will progress as tolerated.  OBJECTIVE IMPAIRMENTS: Abnormal gait, decreased activity tolerance, decreased balance, decreased knowledge of use of DME, decreased mobility, difficulty walking, decreased ROM, decreased strength, and pain.   ACTIVITY LIMITATIONS: standing,  stairs, transfers, and locomotion level  PARTICIPATION LIMITATIONS: meal prep, cleaning, laundry, driving, shopping, and community activity  PERSONAL FACTORS: Behavior pattern, Education, Fitness, Social background, and Time since onset of injury/illness/exacerbation are also affecting patient's functional outcome.   REHAB POTENTIAL: Good  CLINICAL DECISION MAKING: Stable/uncomplicated  EVALUATION COMPLEXITY: Low   GOALS: Goals reviewed with patient? No  SHORT TERM GOALS: Target date: 01/23/2024   Will be compliant with appropriate progressive HEP  Baseline: Goal status: Met 01/10/24  2.  L ankle AROM to improve to: -10* DF, PF to 65*, eversion to 12* Baseline:  Goal status: ONGOING 01/02/24 see above, Progressing 01/10/24  3.  Will be independent with appropriate edema management techniques  Baseline:  Goal status: Met 01/10/24  4.  Will be consistently ambulatory for household distances with LRAD  Baseline:  Goal status: Progressing 01/10/24    LONG TERM GOALS: Target date: 02/20/2024    MMT to be at least 4/5 L ankle  Baseline:  Goal status: INITIAL  2.  L ankle AROM to be as follows: DF 10* Baseline:  Goal status: INITIAL  3.  Will score at least 20/24 on DGI  Baseline:  Goal status: INITIAL  4.  Will be able to ambulate community distances with LRAD, L ankle pain no more than 3/10 Baseline:  Goal status: INITIAL  5.  PSFS to have improved by 3 points  Baseline:  Goal status: INITIAL     PLAN:  PT FREQUENCY: 2x/week  PT DURATION: 8 weeks  PLANNED INTERVENTIONS: 97750- Physical Performance Testing, 97110-Therapeutic exercises, 97530- Therapeutic activity, [id number]- Neuromuscular re-education, 97535- Self Care, 02859- Manual therapy, 97116- Gait training, 97016- Vasopneumatic device, and Cryotherapy  PLAN FOR NEXT SESSION: WBAT per op notes, ROM and edema management, progressive loading and gait training as tolerated, progressive strengthening. Tanda Sorrow, PTA  01/10/24 8:44 AM

## 2024-01-10 NOTE — Patient Instructions (Signed)
 Ms. Hayne , Thank you for taking time out of your busy schedule to complete your Annual Wellness Visit with me. I enjoyed our conversation and look forward to speaking with you again next year. I, as well as your care team,  appreciate your ongoing commitment to your health goals. Please review the following plan we discussed and let me know if I can assist you in the future. Your Game plan/ To Do List    Follow up Visits: Next Medicare AWV with our clinical staff: In 1 year    Have you seen your provider in the last 6 months (3 months if uncontrolled diabetes)? Yes Next Office Visit with your provider: 04/05/24 @ 8:40  Clinician Recommendations:  Aim for 30 minutes of exercise or brisk walking, 6-8 glasses of water, and 5 servings of fruits and vegetables each day.       This is a list of the screening recommended for you and due dates:  Health Maintenance  Topic Date Due   DTaP/Tdap/Td vaccine (3 - Td or Tdap) 11/09/2023   COVID-19 Vaccine (7 - Pfizer risk 2024-25 season) 11/24/2023   Flu Shot  02/02/2024   Medicare Annual Wellness Visit  01/09/2025   Pneumococcal Vaccine for age over 71  Completed   DEXA scan (bone density measurement)  Completed   Zoster (Shingles) Vaccine  Completed   Hepatitis B Vaccine  Aged Out   HPV Vaccine  Aged Out   Meningitis B Vaccine  Aged Out   Colon Cancer Screening  Discontinued    Advanced directives: (In Chart) A copy of your advanced directives are scanned into your chart should your provider ever need it. Advance Care Planning is important because it:  [x]  Makes sure you receive the medical care that is consistent with your values, goals, and preferences  [x]  It provides guidance to your family and loved ones and reduces their decisional burden about whether or not they are making the right decisions based on your wishes.  Follow the link provided in your after visit summary or read over the paperwork we have mailed to you to help you  started getting your Advance Directives in place. If you need assistance in completing these, please reach out to us  so that we can help you!  See attachments for Preventive Care and Fall Prevention Tips.

## 2024-01-10 NOTE — Progress Notes (Signed)
 Subjective:   Shelly Pearson is a 83 y.o. who presents for a Medicare Wellness preventive visit.  As a reminder, Annual Wellness Visits don't include a physical exam, and some assessments may be limited, especially if this visit is performed virtually. We may recommend an in-person follow-up visit with your provider if needed.  Visit Complete: Virtual I connected with  Shelly Pearson on 01/10/24 by a audio enabled telemedicine application and verified that I am speaking with the correct person using two identifiers.  Patient Location: Home  Provider Location: Home Office  I discussed the limitations of evaluation and management by telemedicine. The patient expressed understanding and agreed to proceed.  Vital Signs: Because this visit was a virtual/telehealth visit, some criteria may be missing or patient reported. Any vitals not documented were not able to be obtained and vitals that have been documented are patient reported.  VideoDeclined- This patient declined Librarian, academic. Therefore the visit was completed with audio only.  Persons Participating in Visit: Patient.  AWV Questionnaire: Yes: Patient Medicare AWV questionnaire was completed by the patient on 01/09/24; I have confirmed that all information answered by patient is correct and no changes since this date.  Cardiac Risk Factors include: advanced age (>14men, >80 women);hypertension     Objective:    Today's Vitals   01/10/24 1402  Weight: 134 lb (60.8 kg)  Height: 5' 4 (1.626 m)   Body mass index is 23 kg/m.     01/10/2024    2:15 PM 12/26/2023   11:49 AM 12/19/2023   12:57 PM 06/10/2023   12:31 PM 06/08/2023    2:39 PM 06/05/2023    2:40 PM 04/19/2022    9:09 AM  Advanced Directives  Does Patient Have a Medical Advance Directive? Yes Yes Yes Yes Yes No Yes  Type of Estate agent of [place];Living will Living will;Healthcare Power of [employer]  Power of [place];Living will Healthcare Power of [place];Living will  Healthcare Power of [place];Living will  Does patient want to make changes to medical advance directive? No - Patient declined  No - Patient declined  No - Patient declined  No - Patient declined  Copy of Healthcare Power of Attorney in Chart? Yes - validated most recent copy scanned in chart (See row information) No - copy requested  No - copy requested No - copy requested  Yes - validated most recent copy scanned in chart (See row information)    Current Medications (verified) Outpatient Encounter Medications as of 01/10/2024  Medication Sig   atorvastatin  (LIPITOR) 20 MG tablet TAKE 1 TABLET DAILY   Biotin 10 MG CAPS Take 10 mg by mouth daily.   levothyroxine  (SYNTHROID ) 50 MCG tablet TAKE 1 TABLET DAILY BEFORE BREAKFAST   losartan  (COZAAR ) 25 MG tablet TAKE 1 TABLET DAILY   Multiple Vitamins-Minerals (MATURE ADULT CENTURY PO) Take 1 tablet by mouth daily.   Polyethyl Glycol-Propyl Glycol (SYSTANE OP) Place 2 drops into both eyes 3 (three) times daily.   trospium  (SANCTURA ) 20 MG tablet TAKE 1 TABLET AT BEDTIME   donepezil  (ARICEPT ) 5 MG tablet Take 1 tablet (5 mg total) by mouth at bedtime.   No facility-administered encounter medications on file as of 01/10/2024.    Allergies (verified) Adhesive  [tape]   History: Past Medical History:  Diagnosis Date   Allergy 60 or so years ago   Pollen, bandaids, pollen   Arthritis 20 or more years ago   Thumbs, feet, shoulder,  back, etc.   B12 deficiency 03/02/2020   GERD (gastroesophageal reflux disease)    Hyperlipidemia Been on atorvastatin  for ? years   Hypertension    Seasonal allergies    Thyroid  disease    Urge incontinence of urine 12/10/2020   Urgency incontinence    Past Surgical History:  Procedure Laterality Date   ABDOMINAL HYSTERECTOMY     APPENDECTOMY  12/18/71   When daughter was born by c-section   CESAREAN SECTION     CYSTOCELE REPAIR      FRACTURE SURGERY     HARDWARE REMOVAL Left 12/19/2023   Procedure: REMOVAL, HARDWARE;  Surgeon: Genelle Standing, MD;  Location:  SURGERY CENTER;  Service: Orthopedics;  Laterality: Left;  LEFT  ANKLE REMOVAL OF HARDWARE   ORIF ANKLE FRACTURE Left 06/08/2023   Procedure: LEFT OPEN REDUCTION INTERNAL FIXATION (ORIF) ANKLE FRACTURE;  Surgeon: Genelle Standing, MD;  Location: [facility];  Service: Orthopedics;  Laterality: Left;   ROTATOR CUFF REPAIR Right    2018   Family History  Problem Relation Age of Onset   Stroke Mother    Cancer Father    Alcohol abuse Father    Cancer Sister    Cancer Brother    Arthritis Maternal Grandmother    Arthritis Paternal Grandmother    Cancer Paternal Grandmother    Cancer Brother    Social History   Socioeconomic History   Marital status: Married    Spouse name: Not on file   Number of children: Not on file   Years of education: Not on file   Highest education level: Bachelor's degree (e.g., BA, AB, BS)  Occupational History   Occupation: Retired  Tobacco Use   Smoking status: Never   Smokeless tobacco: Never  Vaping Use   Vaping status: Never Used  Substance and Sexual Activity   Alcohol use: Yes    Comment: very rare    Drug use: Never   Sexual activity: Not Currently    Birth control/protection: Post-menopausal  Other Topics Concern   Not on file  Social History Narrative   Lives with husband   Right handed   1 cup tea every morning   Social Drivers of Health   Financial Resource Strain: Low Risk  (01/09/2024)   Overall Financial Resource Strain (CARDIA)    Difficulty of Paying Living Expenses: Not hard at all  Food Insecurity: No Food Insecurity (01/09/2024)   Hunger Vital Sign    Worried About Running Out of Food in the Last Year: Never true    Ran Out of Food in the Last Year: Never true  Transportation Needs: No Transportation Needs (01/09/2024)   PRAPARE - Administrator, Civil Service (Medical): No     Lack of Transportation (Non-Medical): No  Physical Activity: Insufficiently Active (01/09/2024)   Exercise Vital Sign    Days of Exercise per Week: 3 days    Minutes of Exercise per Session: 40 min  Stress: No Stress Concern Present (01/09/2024)   [employer] of Occupational Health - Occupational Stress Questionnaire    Feeling of Stress: Only a little  Social Connections: Socially Integrated (01/09/2024)   Social Connection and Isolation Panel    Frequency of Communication with Friends and Family: Three times a week    Frequency of Social Gatherings with Friends and Family: Once a week    Attends Religious Services: More than 4 times per year    Active Member of Clubs or Organizations: Yes  Attends Engineer, structural: More than 4 times per year    Marital Status: Married    Tobacco Counseling Counseling given: Not Answered    Clinical Intake:  Pre-visit preparation completed: Yes  Pain : No/denies pain     Diabetes: No  No results found for: HGBA1C   How often do you need to have someone help you when you read instructions, pamphlets, or other written materials from your doctor or pharmacy?: 1 - Never  Interpreter Needed?: No  Information entered by :: Charmaine Bloodgood LPN   Activities of Daily Living     01/09/2024    2:19 PM 12/19/2023    1:00 PM  In your present state of health, do you have any difficulty performing the following activities:  Hearing? 0 0  Vision? 0 0  Difficulty concentrating or making decisions? 1 0  Walking or climbing stairs? 1   Dressing or bathing? 0   Doing errands, shopping? 0   Preparing Food and eating ? N   Using the Toilet? N   In the past six months, have you accidently leaked urine? N   Do you have problems with loss of bowel control? N   Managing your Medications? N   Managing your Finances? N   Housekeeping or managing your Housekeeping? N     Patient Care Team: Thedora Garnette HERO, MD as PCP - General (Family  Medicine) Tobie Baptist, MD as Consulting Physician (Ophthalmology) Genelle Standing, MD as Consulting Physician (Orthopedic Surgery) Cary No, NP as Nurse Practitioner (Family Medicine)  I have updated your Care Teams any recent Medical Services you may have received from other providers in the past year.     Assessment:   This is a routine wellness examination for Shelly Pearson.  Hearing/Vision screen Hearing Screening - Comments:: Denies hearing difficulties   Vision Screening - Comments:: Wears rx glasses - up to date with routine eye exams with Dr. Tobie    Goals Addressed             This Visit's Progress    Patient Stated   On track    Continue current healthy lifestyle       Depression Screen     01/10/2024    2:11 PM 04/07/2023   10:26 AM 04/19/2022    9:09 AM 03/23/2021    9:40 AM 03/19/2021    9:04 AM 02/27/2020   10:44 AM 01/30/2019    9:43 AM  PHQ 2/9 Scores  PHQ - 2 Score 0 0 0 0 0 0 0  PHQ- 9 Score  2         Fall Risk     01/09/2024    2:19 PM 04/07/2023   10:26 AM 04/19/2022    9:00 AM 04/18/2022    8:37 AM 03/29/2022   11:22 AM  Fall Risk   Falls in the past year? 1 1 1 1 1   Number falls in past yr: 0 0 1 1 1   Injury with Fall? 1 0 1 1 1   Risk for fall due to : History of fall(s);Impaired mobility No Fall Risks History of fall(s);Impaired balance/gait;Orthopedic patient    Follow up Education provided;Falls prevention discussed;Falls evaluation completed Falls prevention discussed Education provided;Falls prevention discussed        Data saved with a previous flowsheet row definition    MEDICARE RISK AT HOME:  Medicare Risk at Home Any stairs in or around the home?: (Patient-Rptd) Yes If so, are there any without handrails?: (  Patient-Rptd) No Home free of loose throw rugs in walkways, pet beds, electrical cords, etc?: (Patient-Rptd) Yes Adequate lighting in your home to reduce risk of falls?: (Patient-Rptd) Yes Life alert?: (Patient-Rptd) No Use  of a cane, walker or w/c?: (Patient-Rptd) No Grab bars in the bathroom?: (Patient-Rptd) Yes Shower chair or bench in shower?: (Patient-Rptd) Yes Elevated toilet seat or a handicapped toilet?: (Patient-Rptd) Yes  TIMED UP AND GO:  Was the test performed?  No  Cognitive Function: Impaired: Patient has current diagnosis of cognitive impairment.      12/18/2023    2:37 PM 09/05/2022   10:34 AM 01/11/2022   10:50 AM 07/17/2021   12:00 PM  Montreal Cognitive Assessment   Visuospatial/ Executive (0/5) 4 5 5 5   Naming (0/3) 3 3 3 3   Attention: Read list of digits (0/2) 2 2 2 2   Attention: Read list of letters (0/1) 1 1 1 1   Attention: Serial 7 subtraction starting at 100 (0/3) 3 3 3 2   Language: Repeat phrase (0/2) 2 2 1 2   Language : Fluency (0/1) 1 1 1 1   Abstraction (0/2) 2 2 1 2   Delayed Recall (0/5) 1 0 2 0  Orientation (0/6) 6 6 5 6   Total 25 25 24 24       04/19/2022    9:15 AM 04/19/2022    9:10 AM 02/27/2020   10:54 AM  6CIT Screen  What Year? 0 points 0 points 0 points  What month? 0 points 0 points 0 points  What time? 0 points 0 points 0 points  Count back from 20 0 points 0 points 0 points  Months in reverse 0 points 0 points 0 points  Repeat phrase 0 points 0 points 0 points  Total Score 0 points 0 points 0 points    Immunizations Immunization History  Administered Date(s) Administered   Fluad Quad(high Dose 65+) 03/26/2019, 03/19/2021   Fluzone Influenza virus vaccine,trivalent (IIV3), split virus 05/13/2009, 06/11/2013   H1N1 07/01/2008   Influenza Split 04/18/2011, 04/07/2020, 03/08/2022   Influenza, High Dose Seasonal PF 04/17/2018, 03/30/2023   Influenza-Unspecified 05/16/2007, 04/10/2008, 05/03/2012, 03/04/2014   Moderna Covid-19 Fall Seasonal Vaccine 47yrs & older 03/25/2022, 05/27/2023   Novel Infuenza-h1n1-09 04/23/2008   PFIZER Comirnaty(Gray Top)Covid-19 Tri-Sucrose Vaccine 11/03/2020   PFIZER(Purple Top)SARS-COV-2 Vaccination 07/23/2019, 08/13/2019,  04/07/2020   PNEUMOCOCCAL CONJUGATE-20 10/21/2021   Pneumococcal Conjugate-13 08/15/2013, 04/17/2018   Pneumococcal Polysaccharide-23 04/29/2004, 07/07/2010   Respiratory Syncytial Virus Vaccine,Recomb Aduvanted(Arexvy) 03/11/2022   Td (Adult) 11/08/2013   Tdap 03/04/2008   Zoster Recombinant(Shingrix) 03/11/2022, 12/07/2023   Zoster, Live 07/17/2007    Screening Tests Health Maintenance  Topic Date Due   DTaP/Tdap/Td (3 - Td or Tdap) 11/09/2023   COVID-19 Vaccine (7 - Pfizer risk 2024-25 season) 11/24/2023   INFLUENZA VACCINE  02/02/2024   Medicare Annual Wellness (AWV)  01/09/2025   Pneumococcal Vaccine: 50+ Years  Completed   DEXA SCAN  Completed   Zoster Vaccines- Shingrix  Completed   Hepatitis B Vaccines  Aged Out   HPV VACCINES  Aged Out   Meningococcal B Vaccine  Aged Out   Colonoscopy  Discontinued    Health Maintenance  Health Maintenance Due  Topic Date Due   DTaP/Tdap/Td (3 - Td or Tdap) 11/09/2023   COVID-19 Vaccine (7 - Pfizer risk 2024-25 season) 11/24/2023    Additional Screening:  Vision Screening: Recommended annual ophthalmology exams for early detection of glaucoma and other disorders of the eye. Would you like a referral to an  eye doctor? No    Dental Screening: Recommended annual dental exams for proper oral hygiene  Community Resource Referral / Chronic Care Management: CRR required this visit?  No   CCM required this visit?  No   Plan:    I have personally reviewed and noted the following in the patient's chart:   Medical and social history Use of alcohol, tobacco or illicit drugs  Current medications and supplements including opioid prescriptions. Patient is not currently taking opioid prescriptions. Functional ability and status Nutritional status Physical activity Advanced directives List of other physicians Hospitalizations, surgeries, and ER visits in previous 12 months Vitals Screenings to include cognitive, depression, and  falls Referrals and appointments  In addition, I have reviewed and discussed with patient certain preventive protocols, quality metrics, and best practice recommendations. A written personalized care plan for preventive services as well as general preventive health recommendations were provided to patient.   Lavelle Pfeiffer [place], [place]   [id number]   After Visit Summary: (MyChart) Due to this being a telephonic visit, the after visit summary with patients personalized plan was offered to patient via MyChart   Notes: PCP Follow Up Recommendations: Would like to discuss MMR titer and cognitive medication at next office visit

## 2024-01-18 ENCOUNTER — Ambulatory Visit: Admitting: Physical Therapy

## 2024-01-18 ENCOUNTER — Encounter: Payer: Self-pay | Admitting: Physical Therapy

## 2024-01-18 DIAGNOSIS — R2681 Unsteadiness on feet: Secondary | ICD-10-CM

## 2024-01-18 DIAGNOSIS — M25572 Pain in left ankle and joints of left foot: Secondary | ICD-10-CM | POA: Diagnosis not present

## 2024-01-18 DIAGNOSIS — R262 Difficulty in walking, not elsewhere classified: Secondary | ICD-10-CM

## 2024-01-18 DIAGNOSIS — R600 Localized edema: Secondary | ICD-10-CM

## 2024-01-18 DIAGNOSIS — M25672 Stiffness of left ankle, not elsewhere classified: Secondary | ICD-10-CM | POA: Diagnosis not present

## 2024-01-18 NOTE — Therapy (Signed)
 OUTPATIENT PHYSICAL THERAPY LOWER EXTREMITY TREATMENT    Patient Name: Shelly Pearson MRN: [id number] DOB:1941/04/05, 83 y.o., female Today's Date: 01/18/2024  END OF SESSION:  PT End of Session - 01/18/24 0939     Visit Number 6    Number of Visits 17    Date for PT Re-Evaluation 02/20/24    Authorization Type MCR and USAA    Authorization Time Period 12/26/23 to 02/20/24    Progress Note Due on Visit 10    PT Start Time 0932    PT Stop Time 1011    PT Time Calculation (min) 39 min    Activity Tolerance Patient tolerated treatment well    Behavior During Therapy [facility] for tasks assessed/performed            Past Medical History:  Diagnosis Date   Allergy 60 or so years ago   Pollen, bandaids, pollen   Arthritis 20 or more years ago   Thumbs, feet, shoulder, back, etc.   B12 deficiency 03/02/2020   GERD (gastroesophageal reflux disease)    Hyperlipidemia Been on atorvastatin  for ? years   Hypertension    Seasonal allergies    Thyroid  disease    Urge incontinence of urine 12/10/2020   Urgency incontinence    Past Surgical History:  Procedure Laterality Date   ABDOMINAL HYSTERECTOMY     APPENDECTOMY  12/18/71   When daughter was born by c-section   CESAREAN SECTION     CYSTOCELE REPAIR     FRACTURE SURGERY     HARDWARE REMOVAL Left 12/19/2023   Procedure: REMOVAL, HARDWARE;  Surgeon: Genelle Standing, MD;  Location: [facility];  Service: Orthopedics;  Laterality: Left;  LEFT  ANKLE REMOVAL OF HARDWARE   ORIF ANKLE FRACTURE Left 06/08/2023   Procedure: LEFT OPEN REDUCTION INTERNAL FIXATION (ORIF) ANKLE FRACTURE;  Surgeon: Genelle Standing, MD;  Location: [facility];  Service: Orthopedics;  Laterality: Left;   ROTATOR CUFF REPAIR Right    2018   Patient Active Problem List   Diagnosis Date Noted   Pain from implanted hardware 12/19/2023   Status post open reduction with internal fixation (ORIF) of fracture of ankle- Left 10/06/2023   Arthritis of  midtarsal joint of left foot 10/06/2023   Exudative age-related macular degeneration, right eye, with active choroidal neovascularization (HCC) 05/24/2022   Decreased grip strength of right hand 09/16/2021   Gait disturbance 07/12/2021   Mild cognitive impairment 07/12/2021   Thyroid  goiter 12/10/2020   Mixed hyperlipidemia 12/10/2020   Degenerative cervical spinal stenosis, mild C4-C5 and C5-C6 12/10/2020   Lumbar degenerative disc disease, L4-L5 with left lateral recess stenosis 12/10/2020   Personal history of fall 09/01/2020   Congenital cavus deformity of both feet 03/02/2020   Metatarsalgia of both feet 03/02/2020   Hand arthritis 03/02/2020   Arthritis of carpometacarpal (CMC) joint of both thumbs 09/19/2019   Overactive bladder 09/19/2019   Sciatica 04/26/2019   Paresthesia of both feet 02/04/2019   Epidermal cyst 04/17/2018   Allergic rhinitis 06/23/2015   Eczema 06/23/2015   Gastro-esophageal reflux disease without esophagitis 06/23/2015   Essential hypertension 06/23/2015   Hypothyroidism 06/23/2015    PCP: Thedora Senior MD   REFERRING PROVIDER: Genelle Standing, MD  REFERRING DIAG:  Diagnosis  [phone] (ICD-10-CM) - Bimalleolar ankle fracture, left, closed, initial encounter    THERAPY DIAG:  Stiffness of left ankle, not elsewhere classified  Pain in left ankle and joints of left foot  Difficulty in walking, not elsewhere classified  Localized edema  Unsteadiness on feet  Rationale for Evaluation and Treatment: Rehabilitation  ONSET DATE: L ankle ORIF 06/2023, hardware removal 12/19/23  SUBJECTIVE:   SUBJECTIVE STATEMENT:   Still having a lot of issues with ankle swelling, used a compression stocking before, waiting for last couple of scabs to heal before putting it on again. Seeing a podiatrist in a couple of weeks. Have a lump in the arch in my left foot that's really painful.   EVAL:I was feeling pretty good after I finished PT, after that the right  ankle I still had a lot of swelling and issues getting into shoes. It stayed tender and gave me some shooting pains down into the foot. Dr. Genelle said that it might help to take out the hardware. I have been careful with it, I know I can put weight on it but being careful when it hurts.  PERTINENT HISTORY:  See above in addition to the following from MD note:   6 months status post left ankle open reduction internal fixation. She is somewhat symptomatic about her medial malleolar screws. She is also having symptoms about her peroneal tendon insertions at the plantar aspect of the foot as well as issues with neuropathy which she is having on the other side as well. Given this I do believe she would benefit from a custom orthotic. I will plan to see her back as needed should she want to consider medial malleolar screw removal PAIN:  Are you having pain? Yes: NPRS scale: 2/10 Pain location: medial and lateral L ankle, even in toes, lump in arch of L foot Pain description: soreness and pulling, sharp pains and spasms sometimes   Aggravating factors: pressure/WB  Relieving factors: keeping it still and elevating   PRECAUTIONS: None  RED FLAGS: None   WEIGHT BEARING RESTRICTIONS:  weightbearing as tolerated per op note   FALLS:  Has patient fallen in last 6 months? No  LIVING ENVIRONMENT: Lives with: lives with their family Lives in: House/apartment Stairs: 4STE with rails, has second floor (steps with split landing) Has following equipment at home: Vannie - 2 wheeled and Wheelchair (manual)  OCCUPATION: retired- Microbiologist and flight attendant   PLOF: Independent, Independent with basic ADLs, Independent with gait, and Independent with transfers  PATIENT GOALS: be able to get around without sharp pains   NEXT MD VISIT: Dr. Genelle 01/03/24  OBJECTIVE:  Note: Objective measures were completed at Evaluation unless otherwise noted.    PATIENT SURVEYS:   THE  PATIENT SPECIFIC FUNCTIONAL SCALE  Place score of 0-10 (0 = unable to perform activity and 10 = able to perform activity at the same level as before injury or problem)  Activity Date: Eval 12/26/23    Grocery shopping  0    2. Cleaning house  5    3. Cooking  2    4.      Total Score 2.3      Total Score = Sum of activity scores/number of activities  Minimally Detectable Change: 3 points (for single activity); 2 points (for average score)  [name] (nd). The Patient Specific Functional Scale . Retrieved from [email or web address]   COGNITION: Overall cognitive status: hx of STM impairment      SENSATION: Not tested  EDEMA:   Appropriate for post-op status, good capillary refill       LOWER EXTREMITY ROM:  Active ROM Left eval Left 01/02/24 Left 01/10/24  Hip flexion  Hip extension     Hip abduction     Hip adduction     Hip internal rotation     Hip external rotation     Knee flexion     Knee extension     Ankle dorsiflexion -25* -10* 0  Ankle plantarflexion 58* 61* 45  Ankle inversion 21* 26* 29  Ankle eversion 5* 7* 19   (Blank rows = not tested)  LOWER EXTREMITY MMT:  MMT Right eval Left eval  Hip flexion    Hip extension    Hip abduction    Hip adduction    Hip internal rotation    Hip external rotation    Knee flexion    Knee extension    Ankle dorsiflexion  3-  Ankle plantarflexion  3-  Ankle inversion    Ankle eversion     (Blank rows = not tested)   GAIT: Distance walked: deferred today due to pain/pulling at surgical site   Able to transfer and wt shift well with RW                                                                                                                                 TREATMENT DATE:   01/18/24  Nustep L5x8 minutes seat 6, BLEs only   Seated ankle PF/DF x20 on rockerboard Seated ankle eversion/inversion x20 rockerboad CW/CCW circles  x20 each BAPS seated Gastroc stretch on slantboard 3x30 seconds Plantar fascia stretch off 4 inch step 3x30 seconds  Heel raises x10 Toe raises x10  Tandem stance blue foam pad 3x30 seconds B  Tandem walks in // bars light BUE support x3 laps    01/10/24 NuStep L5 x 6 min Slant board Calf stretch 6in step ups x10 each  GOALS 4 way ankle Green x15 L ankle PROM w/ end range holds   STM to L plantar fascia Sit to stand LE on airex 2x10 Heel raises on airex  HS curls 20lb 2x12 Leg Ext 5lb 2x12        PATIENT EDUCATION:  Education details: exam findings, POC, HEP, encouraged WBAT, ice, elevation  Person educated: Patient Education method: Explanation, Demonstration, and Handouts Education comprehension: verbalized understanding, returned demonstration, and needs further education  HOME EXERCISE PROGRAM:  Access Code: [email or web address] URL: [email or web address] Date: 01/02/2024 Prepared by: Josette Rough  Exercises - Supine Ankle Pumps in Elevation on Pillows  - 2-3 x daily - 7 x weekly - 1 sets - 10 reps - Ankle Circles in Elevation  - 2-3 x daily - 7 x weekly - 1 sets - 10 reps - Ankle Alphabet in Elevation  - 2-3 x daily - 7 x weekly - 1 sets - 10 reps - Lateral Weight Shift with Parallel Bars (BKA)  - 1 x daily - 7 x weekly - 1 sets - 10 reps - 2 seconds  hold - Supine Active Straight Leg Raise  - 1 x daily -  7 x weekly - 2 sets - 10 reps - Sidelying Hip Abduction  - 1 x daily - 7 x weekly - 2 sets - 10 reps - Seated Long Arc Quad  - 1 x daily - 7 x weekly - 2 sets - 10 reps  ASSESSMENT:  CLINICAL IMPRESSION:  Arrives today doing OK, happy to see her WB on her ankle now, unfortunately still having ongoing pain and edema. Continued working on strength and ROM as tolerated today, encouraged compression stockings when she is able to get these on confidently.   OBJECTIVE IMPAIRMENTS: Abnormal gait, decreased activity tolerance, decreased balance, decreased  knowledge of use of DME, decreased mobility, difficulty walking, decreased ROM, decreased strength, and pain.   ACTIVITY LIMITATIONS: standing, stairs, transfers, and locomotion level  PARTICIPATION LIMITATIONS: meal prep, cleaning, laundry, driving, shopping, and community activity  PERSONAL FACTORS: Behavior pattern, Education, Fitness, Social background, and Time since onset of injury/illness/exacerbation are also affecting patient's functional outcome.   REHAB POTENTIAL: Good  CLINICAL DECISION MAKING: Stable/uncomplicated  EVALUATION COMPLEXITY: Low   GOALS: Goals reviewed with patient? No  SHORT TERM GOALS: Target date: 01/23/2024   Will be compliant with appropriate progressive HEP  Baseline: Goal status: Met 01/10/24  2.  L ankle AROM to improve to: -10* DF, PF to 65*, eversion to 12* Baseline:  Goal status: ONGOING 01/02/24 see above, Progressing 01/10/24  3.  Will be independent with appropriate edema management techniques  Baseline:  Goal status: Met 01/10/24  4.  Will be consistently ambulatory for household distances with LRAD  Baseline:  Goal status: Progressing 01/10/24    LONG TERM GOALS: Target date: 02/20/2024    MMT to be at least 4/5 L ankle  Baseline:  Goal status: INITIAL  2.  L ankle AROM to be as follows: DF 10* Baseline:  Goal status: INITIAL  3.  Will score at least 20/24 on DGI  Baseline:  Goal status: INITIAL  4.  Will be able to ambulate community distances with LRAD, L ankle pain no more than 3/10 Baseline:  Goal status: INITIAL  5.  PSFS to have improved by 3 points  Baseline:  Goal status: INITIAL     PLAN:  PT FREQUENCY: 2x/week  PT DURATION: 8 weeks  PLANNED INTERVENTIONS: 97750- Physical Performance Testing, 97110-Therapeutic exercises, 97530- Therapeutic activity, [id number]- Neuromuscular re-education, 97535- Self Care, [id number]- Manual therapy, 97116- Gait training, 97016- Vasopneumatic device, and Cryotherapy  PLAN FOR NEXT  SESSION: WBAT per op notes, ROM and edema management, progressive loading and gait training as tolerated, progressive strengthening. Continue to encourage compression stockings   Josette Rough, PT, DPT 01/18/24 10:12 AM

## 2024-01-19 ENCOUNTER — Ambulatory Visit: Admitting: Physical Therapy

## 2024-01-23 ENCOUNTER — Ambulatory Visit: Admitting: Physical Therapy

## 2024-01-23 ENCOUNTER — Encounter: Payer: Self-pay | Admitting: Physical Therapy

## 2024-01-23 DIAGNOSIS — M25672 Stiffness of left ankle, not elsewhere classified: Secondary | ICD-10-CM | POA: Diagnosis not present

## 2024-01-23 DIAGNOSIS — R2681 Unsteadiness on feet: Secondary | ICD-10-CM | POA: Diagnosis not present

## 2024-01-23 DIAGNOSIS — R262 Difficulty in walking, not elsewhere classified: Secondary | ICD-10-CM | POA: Diagnosis not present

## 2024-01-23 DIAGNOSIS — M25572 Pain in left ankle and joints of left foot: Secondary | ICD-10-CM | POA: Diagnosis not present

## 2024-01-23 DIAGNOSIS — R600 Localized edema: Secondary | ICD-10-CM

## 2024-01-23 NOTE — Therapy (Signed)
 OUTPATIENT PHYSICAL THERAPY LOWER EXTREMITY TREATMENT    Patient Name: Shelly Pearson MRN: [id number] DOB:30-Dec-1940, 83 y.o., female Today's Date: 01/23/2024  END OF SESSION:  PT End of Session - 01/23/24 0842     Visit Number 7    Date for PT Re-Evaluation 02/20/24    PT Start Time 0843    PT Stop Time 0930    PT Time Calculation (min) 47 min    Activity Tolerance Patient tolerated treatment well    Behavior During Therapy [facility] for tasks assessed/performed            Past Medical History:  Diagnosis Date   Allergy 60 or so years ago   Pollen, bandaids, pollen   Arthritis 20 or more years ago   Thumbs, feet, shoulder, back, etc.   B12 deficiency 03/02/2020   GERD (gastroesophageal reflux disease)    Hyperlipidemia Been on atorvastatin  for ? years   Hypertension    Seasonal allergies    Thyroid  disease    Urge incontinence of urine 12/10/2020   Urgency incontinence    Past Surgical History:  Procedure Laterality Date   ABDOMINAL HYSTERECTOMY     APPENDECTOMY  12/18/71   When daughter was born by c-section   CESAREAN SECTION     CYSTOCELE REPAIR     FRACTURE SURGERY     HARDWARE REMOVAL Left 12/19/2023   Procedure: REMOVAL, HARDWARE;  Surgeon: Genelle Standing, MD;  Location: [facility];  Service: Orthopedics;  Laterality: Left;  LEFT  ANKLE REMOVAL OF HARDWARE   ORIF ANKLE FRACTURE Left 06/08/2023   Procedure: LEFT OPEN REDUCTION INTERNAL FIXATION (ORIF) ANKLE FRACTURE;  Surgeon: Genelle Standing, MD;  Location: [facility];  Service: Orthopedics;  Laterality: Left;   ROTATOR CUFF REPAIR Right    2018   Patient Active Problem List   Diagnosis Date Noted   Pain from implanted hardware 12/19/2023   Status post open reduction with internal fixation (ORIF) of fracture of ankle- Left 10/06/2023   Arthritis of midtarsal joint of left foot 10/06/2023   Exudative age-related macular degeneration, right eye, with active choroidal neovascularization (HCC)  05/24/2022   Decreased grip strength of right hand 09/16/2021   Gait disturbance 07/12/2021   Mild cognitive impairment 07/12/2021   Thyroid  goiter 12/10/2020   Mixed hyperlipidemia 12/10/2020   Degenerative cervical spinal stenosis, mild C4-C5 and C5-C6 12/10/2020   Lumbar degenerative disc disease, L4-L5 with left lateral recess stenosis 12/10/2020   Personal history of fall 09/01/2020   Congenital cavus deformity of both feet 03/02/2020   Metatarsalgia of both feet 03/02/2020   Hand arthritis 03/02/2020   Arthritis of carpometacarpal (CMC) joint of both thumbs 09/19/2019   Overactive bladder 09/19/2019   Sciatica 04/26/2019   Paresthesia of both feet 02/04/2019   Epidermal cyst 04/17/2018   Allergic rhinitis 06/23/2015   Eczema 06/23/2015   Gastro-esophageal reflux disease without esophagitis 06/23/2015   Essential hypertension 06/23/2015   Hypothyroidism 06/23/2015    PCP: Thedora Senior MD   REFERRING PROVIDER: Genelle Standing, MD  REFERRING DIAG:  Diagnosis  [phone] (ICD-10-CM) - Bimalleolar ankle fracture, left, closed, initial encounter    THERAPY DIAG:  Stiffness of left ankle, not elsewhere classified  Pain in left ankle and joints of left foot  Difficulty in walking, not elsewhere classified  Localized edema  Rationale for Evaluation and Treatment: Rehabilitation  ONSET DATE: L ankle ORIF 06/2023, hardware removal 12/19/23  SUBJECTIVE:   SUBJECTIVE STATEMENT:   Fine, but not normal It  still swells, wearing compression socks  EVAL:I was feeling pretty good after I finished PT, after that the right ankle I still had a lot of swelling and issues getting into shoes. It stayed tender and gave me some shooting pains down into the foot. Dr. Genelle said that it might help to take out the hardware. I have been careful with it, I know I can put weight on it but being careful when it hurts.  PERTINENT HISTORY:  See above in addition to the following from MD  note:   6 months status post left ankle open reduction internal fixation. She is somewhat symptomatic about her medial malleolar screws. She is also having symptoms about her peroneal tendon insertions at the plantar aspect of the foot as well as issues with neuropathy which she is having on the other side as well. Given this I do believe she would benefit from a custom orthotic. I will plan to see her back as needed should she want to consider medial malleolar screw removal PAIN:  Are you having pain? Yes: NPRS scale: 0/10 Pain location: medial and lateral L ankle, even in toes, lump in arch of L foot Pain description: soreness and pulling, sharp pains and spasms sometimes   Aggravating factors: pressure/WB  Relieving factors: keeping it still and elevating   PRECAUTIONS: None  RED FLAGS: None   WEIGHT BEARING RESTRICTIONS:  weightbearing as tolerated per op note   FALLS:  Has patient fallen in last 6 months? No  LIVING ENVIRONMENT: Lives with: lives with their family Lives in: House/apartment Stairs: 4STE with rails, has second floor (steps with split landing) Has following equipment at home: Vannie - 2 wheeled and Wheelchair (manual)  OCCUPATION: retired- Microbiologist and flight attendant   PLOF: Independent, Independent with basic ADLs, Independent with gait, and Independent with transfers  PATIENT GOALS: be able to get around without sharp pains   NEXT MD VISIT: Dr. Genelle 01/03/24  OBJECTIVE:  Note: Objective measures were completed at Evaluation unless otherwise noted.    PATIENT SURVEYS:   THE PATIENT SPECIFIC FUNCTIONAL SCALE  Place score of 0-10 (0 = unable to perform activity and 10 = able to perform activity at the same level as before injury or problem)  Activity Date: Eval 12/26/23    Grocery shopping  0    2. Cleaning house  5    3. Cooking  2    4.      Total Score 2.3      Total Score = Sum of activity scores/number of  activities  Minimally Detectable Change: 3 points (for single activity); 2 points (for average score)  [name] (nd). The Patient Specific Functional Scale . Retrieved from [email or web address]   COGNITION: Overall cognitive status: hx of STM impairment      SENSATION: Not tested  EDEMA:   Appropriate for post-op status, good capillary refill       LOWER EXTREMITY ROM:  Active ROM Left eval Left 01/02/24 Left 01/10/24  Hip flexion     Hip extension     Hip abduction     Hip adduction     Hip internal rotation     Hip external rotation     Knee flexion     Knee extension     Ankle dorsiflexion -25* -10* 0  Ankle plantarflexion 58* 61* 45  Ankle inversion 21* 26* 29  Ankle eversion 5* 7* 19   (Blank rows = not tested)  LOWER EXTREMITY MMT:  MMT Right eval Left eval  Hip flexion    Hip extension    Hip abduction    Hip adduction    Hip internal rotation    Hip external rotation    Knee flexion    Knee extension    Ankle dorsiflexion  3-  Ankle plantarflexion  3-  Ankle inversion    Ankle eversion     (Blank rows = not tested)   GAIT: Distance walked: deferred today due to pain/pulling at surgical site   Able to transfer and wt shift well with RW                                                                                                                                 TREATMENT DATE:  01/23/24 Bike L 3 x 6 min 20lb Resisted gait 4 way x 3 each Slant board calf stretch 4in box on airex froward & lateral step ups x10 each  HS curls 20lb 2x12 Leg Ext 5lb 2x12 L ankle PROM w/ end range holds   STM to L plantar fascia  01/18/24 Nustep L5x8 minutes seat 6, BLEs only   Seated ankle PF/DF x20 on rockerboard Seated ankle eversion/inversion x20 rockerboad CW/CCW circles x20 each BAPS seated Gastroc stretch on slantboard 3x30 seconds Plantar fascia stretch off 4 inch step 3x30  seconds  Heel raises x10 Toe raises x10  Tandem stance blue foam pad 3x30 seconds B  Tandem walks in // bars light BUE support x3 laps    01/10/24 NuStep L5 x 6 min Slant board Calf stretch 6in step ups x10 each  GOALS 4 way ankle Green x15 L ankle PROM w/ end range holds   STM to L plantar fascia Sit to stand LE on airex 2x10 Heel raises on airex  HS curls 20lb 2x12 Leg Ext 5lb 2x12        PATIENT EDUCATION:  Education details: exam findings, POC, HEP, encouraged WBAT, ice, elevation  Person educated: Patient Education method: Explanation, Demonstration, and Handouts Education comprehension: verbalized understanding, returned demonstration, and needs further education  HOME EXERCISE PROGRAM:  Access Code: [email or web address] URL: [email or web address] Date: 01/02/2024 Prepared by: Josette Rough  Exercises - Supine Ankle Pumps in Elevation on Pillows  - 2-3 x daily - 7 x weekly - 1 sets - 10 reps - Ankle Circles in Elevation  - 2-3 x daily - 7 x weekly - 1 sets - 10 reps - Ankle Alphabet in Elevation  - 2-3 x daily - 7 x weekly - 1 sets - 10 reps - Lateral Weight Shift with Parallel Bars (BKA)  - 1 x daily - 7 x weekly - 1 sets - 10 reps - 2 seconds  hold - Supine Active Straight Leg Raise  - 1 x daily - 7 x weekly - 2 sets - 10 reps - Sidelying Hip Abduction  - 1 x daily - 7 x weekly -  2 sets - 10 reps - Seated Long Arc Quad  - 1 x daily - 7 x weekly - 2 sets - 10 reps  ASSESSMENT:  CLINICAL IMPRESSION:  Pt Arrives doing OK, unfortunately still having ongoing pain and edema. Continued working on strength and ROM as tolerated today. L ankle appears to pronate under weight bearing. Pt still has a hard nodule in the arch of her foot. Gait appears to be more fluid.   OBJECTIVE IMPAIRMENTS: Abnormal gait, decreased activity tolerance, decreased balance, decreased knowledge of use of DME, decreased mobility, difficulty walking, decreased ROM, decreased strength, and  pain.   ACTIVITY LIMITATIONS: standing, stairs, transfers, and locomotion level  PARTICIPATION LIMITATIONS: meal prep, cleaning, laundry, driving, shopping, and community activity  PERSONAL FACTORS: Behavior pattern, Education, Fitness, Social background, and Time since onset of injury/illness/exacerbation are also affecting patient's functional outcome.   REHAB POTENTIAL: Good  CLINICAL DECISION MAKING: Stable/uncomplicated  EVALUATION COMPLEXITY: Low   GOALS: Goals reviewed with patient? No  SHORT TERM GOALS: Target date: 01/23/2024   Will be compliant with appropriate progressive HEP  Baseline: Goal status: Met 01/10/24  2.  L ankle AROM to improve to: -10* DF, PF to 65*, eversion to 12* Baseline:  Goal status: ONGOING 01/02/24 see above, Progressing 01/10/24  3.  Will be independent with appropriate edema management techniques  Baseline:  Goal status: Met 01/10/24  4.  Will be consistently ambulatory for household distances with LRAD  Baseline:  Goal status: Progressing 01/10/24    LONG TERM GOALS: Target date: 02/20/2024    MMT to be at least 4/5 L ankle  Baseline:  Goal status: INITIAL  2.  L ankle AROM to be as follows: DF 10* Baseline:  Goal status: INITIAL  3.  Will score at least 20/24 on DGI  Baseline:  Goal status: INITIAL  4.  Will be able to ambulate community distances with LRAD, L ankle pain no more than 3/10 Baseline:  Goal status: INITIAL  5.  PSFS to have improved by 3 points  Baseline:  Goal status: INITIAL     PLAN:  PT FREQUENCY: 2x/week  PT DURATION: 8 weeks  PLANNED INTERVENTIONS: 97750- Physical Performance Testing, 97110-Therapeutic exercises, 97530- Therapeutic activity, [id number]- Neuromuscular re-education, 97535- Self Care, 02859- Manual therapy, 97116- Gait training, 97016- Vasopneumatic device, and Cryotherapy  PLAN FOR NEXT SESSION: WBAT per op notes, ROM and edema management, progressive loading and gait training as  tolerated, progressive strengthening. Continue to encourage compression stockings   Josette Rough, PT, DPT 01/23/24 8:42 AM

## 2024-01-29 ENCOUNTER — Encounter: Payer: Self-pay | Admitting: Podiatry

## 2024-01-29 ENCOUNTER — Ambulatory Visit (INDEPENDENT_AMBULATORY_CARE_PROVIDER_SITE_OTHER): Admitting: Podiatry

## 2024-01-29 ENCOUNTER — Ambulatory Visit (INDEPENDENT_AMBULATORY_CARE_PROVIDER_SITE_OTHER)

## 2024-01-29 VITALS — Ht 64.0 in | Wt 134.0 lb

## 2024-01-29 DIAGNOSIS — M19072 Primary osteoarthritis, left ankle and foot: Secondary | ICD-10-CM | POA: Diagnosis not present

## 2024-01-29 DIAGNOSIS — M7752 Other enthesopathy of left foot: Secondary | ICD-10-CM | POA: Diagnosis not present

## 2024-02-02 ENCOUNTER — Ambulatory Visit: Attending: Orthopaedic Surgery | Admitting: Physical Therapy

## 2024-02-02 ENCOUNTER — Encounter: Payer: Self-pay | Admitting: Physical Therapy

## 2024-02-02 DIAGNOSIS — R2681 Unsteadiness on feet: Secondary | ICD-10-CM | POA: Diagnosis not present

## 2024-02-02 DIAGNOSIS — M25672 Stiffness of left ankle, not elsewhere classified: Secondary | ICD-10-CM | POA: Diagnosis not present

## 2024-02-02 DIAGNOSIS — R600 Localized edema: Secondary | ICD-10-CM | POA: Diagnosis present

## 2024-02-02 DIAGNOSIS — R262 Difficulty in walking, not elsewhere classified: Secondary | ICD-10-CM | POA: Diagnosis present

## 2024-02-02 DIAGNOSIS — M25572 Pain in left ankle and joints of left foot: Secondary | ICD-10-CM | POA: Insufficient documentation

## 2024-02-02 NOTE — Therapy (Signed)
 OUTPATIENT PHYSICAL THERAPY LOWER EXTREMITY TREATMENT    Patient Name: Shelly Pearson MRN: [id number] DOB:January 20, 1941, 83 y.o., female Today's Date: 02/02/2024  END OF SESSION:  PT End of Session - 02/02/24 0844     Visit Number 8    Date for PT Re-Evaluation 02/20/24    PT Start Time 0844    PT Stop Time 0927    PT Time Calculation (min) 43 min            Past Medical History:  Diagnosis Date   Allergy 60 or so years ago   Pollen, bandaids, pollen   Arthritis 20 or more years ago   Thumbs, feet, shoulder, back, etc.   B12 deficiency 03/02/2020   GERD (gastroesophageal reflux disease)    Hyperlipidemia Been on atorvastatin  for ? years   Hypertension    Seasonal allergies    Thyroid  disease    Urge incontinence of urine 12/10/2020   Urgency incontinence    Past Surgical History:  Procedure Laterality Date   ABDOMINAL HYSTERECTOMY     APPENDECTOMY  12/18/71   When daughter was born by c-section   CESAREAN SECTION     CYSTOCELE REPAIR     FRACTURE SURGERY     HARDWARE REMOVAL Left 12/19/2023   Procedure: REMOVAL, HARDWARE;  Surgeon: Genelle Standing, MD;  Location: [facility];  Service: Orthopedics;  Laterality: Left;  LEFT  ANKLE REMOVAL OF HARDWARE   ORIF ANKLE FRACTURE Left 06/08/2023   Procedure: LEFT OPEN REDUCTION INTERNAL FIXATION (ORIF) ANKLE FRACTURE;  Surgeon: Genelle Standing, MD;  Location: [facility];  Service: Orthopedics;  Laterality: Left;   ROTATOR CUFF REPAIR Right    2018   Patient Active Problem List   Diagnosis Date Noted   Pain from implanted hardware 12/19/2023   Status post open reduction with internal fixation (ORIF) of fracture of ankle- Left 10/06/2023   Arthritis of midtarsal joint of left foot 10/06/2023   Exudative age-related macular degeneration, right eye, with active choroidal neovascularization (HCC) 05/24/2022   Decreased grip strength of right hand 09/16/2021   Gait disturbance 07/12/2021   Mild cognitive  impairment 07/12/2021   Thyroid  goiter 12/10/2020   Mixed hyperlipidemia 12/10/2020   Degenerative cervical spinal stenosis, mild C4-C5 and C5-C6 12/10/2020   Lumbar degenerative disc disease, L4-L5 with left lateral recess stenosis 12/10/2020   Personal history of fall 09/01/2020   Congenital cavus deformity of both feet 03/02/2020   Metatarsalgia of both feet 03/02/2020   Hand arthritis 03/02/2020   Arthritis of carpometacarpal (CMC) joint of both thumbs 09/19/2019   Overactive bladder 09/19/2019   Sciatica 04/26/2019   Paresthesia of both feet 02/04/2019   Epidermal cyst 04/17/2018   Allergic rhinitis 06/23/2015   Eczema 06/23/2015   Gastro-esophageal reflux disease without esophagitis 06/23/2015   Essential hypertension 06/23/2015   Hypothyroidism 06/23/2015    PCP: Thedora Senior MD   REFERRING PROVIDER: Genelle Standing, MD  REFERRING DIAG:  Diagnosis  [phone] (ICD-10-CM) - Bimalleolar ankle fracture, left, closed, initial encounter    THERAPY DIAG:  Stiffness of left ankle, not elsewhere classified  Pain in left ankle and joints of left foot  Difficulty in walking, not elsewhere classified  Localized edema  Unsteadiness on feet  Rationale for Evaluation and Treatment: Rehabilitation  ONSET DATE: L ankle ORIF 06/2023, hardware removal 12/19/23  SUBJECTIVE:   SUBJECTIVE STATEMENT:   Mostly better, last night has some pain on the heel when putting weight on it  EVAL:I was feeling pretty  good after I finished PT, after that the right ankle I still had a lot of swelling and issues getting into shoes. It stayed tender and gave me some shooting pains down into the foot. Dr. Genelle said that it might help to take out the hardware. I have been careful with it, I know I can put weight on it but being careful when it hurts.  PERTINENT HISTORY:  See above in addition to the following from MD note:   6 months status post left ankle open reduction internal fixation.  She is somewhat symptomatic about her medial malleolar screws. She is also having symptoms about her peroneal tendon insertions at the plantar aspect of the foot as well as issues with neuropathy which she is having on the other side as well. Given this I do believe she would benefit from a custom orthotic. I will plan to see her back as needed should she want to consider medial malleolar screw removal PAIN:  Are you having pain? Yes: NPRS scale: 0/10 Pain location: medial and lateral L ankle, even in toes, lump in arch of L foot Pain description: soreness and pulling, sharp pains and spasms sometimes   Aggravating factors: pressure/WB  Relieving factors: keeping it still and elevating   PRECAUTIONS: None  RED FLAGS: None   WEIGHT BEARING RESTRICTIONS:  weightbearing as tolerated per op note   FALLS:  Has patient fallen in last 6 months? No  LIVING ENVIRONMENT: Lives with: lives with their family Lives in: House/apartment Stairs: 4STE with rails, has second floor (steps with split landing) Has following equipment at home: Vannie - 2 wheeled and Wheelchair (manual)  OCCUPATION: retired- Microbiologist and flight attendant   PLOF: Independent, Independent with basic ADLs, Independent with gait, and Independent with transfers  PATIENT GOALS: be able to get around without sharp pains   NEXT MD VISIT: Dr. Genelle 01/03/24  OBJECTIVE:  Note: Objective measures were completed at Evaluation unless otherwise noted.    PATIENT SURVEYS:   THE PATIENT SPECIFIC FUNCTIONAL SCALE  Place score of 0-10 (0 = unable to perform activity and 10 = able to perform activity at the same level as before injury or problem)  Activity Date: Eval 12/26/23    Grocery shopping  0    2. Cleaning house  5    3. Cooking  2    4.      Total Score 2.3      Total Score = Sum of activity scores/number of activities  Minimally Detectable Change: 3 points (for single activity); 2 points (for  average score)  [name] (nd). The Patient Specific Functional Scale . Retrieved from [email or web address]   COGNITION: Overall cognitive status: hx of STM impairment      SENSATION: Not tested  EDEMA:   Appropriate for post-op status, good capillary refill       LOWER EXTREMITY ROM:  Active ROM Left eval Left 01/02/24 Left 01/10/24 Left  02/02/24  Hip flexion      Hip extension      Hip abduction      Hip adduction      Hip internal rotation      Hip external rotation      Knee flexion      Knee extension      Ankle dorsiflexion -25* -10* 0 5  Ankle plantarflexion 58* 61* 45 45  Ankle inversion 21* 26* 29 30  Ankle eversion 5* 7* 19 26   (Blank  rows = not tested)  LOWER EXTREMITY MMT:  MMT Left eval Left 02/02/24  Hip flexion    Hip extension    Hip abduction    Hip adduction    Hip internal rotation    Hip external rotation    Knee flexion    Knee extension    Ankle dorsiflexion 3- 4  Ankle plantarflexion 3- 4  Ankle inversion  4  Ankle eversion     (Blank rows = not tested)   GAIT: Distance walked: deferred today due to pain/pulling at surgical site   Able to transfer and wt shift well with RW                                                                                                                                 TREATMENT DATE:  02/02/24 Bike L3 x 6 min L ankle PROM w/ end range holds  4 way ankle Green x15 6in step ups  20lb resisted gait 4 way x 4  4in step downs  Stair training alternating pattern  01/23/24 Bike L 3 x 6 min 20lb Resisted gait 4 way x 3 each Slant board calf stretch 4in box on airex froward & lateral step ups x10 each  HS curls 20lb 2x12 Leg Ext 5lb 2x12 L ankle PROM w/ end range holds   STM to L plantar fascia  01/18/24 Nustep L5x8 minutes seat 6, BLEs only   Seated ankle PF/DF x20 on rockerboard Seated ankle eversion/inversion x20  rockerboad CW/CCW circles x20 each BAPS seated Gastroc stretch on slantboard 3x30 seconds Plantar fascia stretch off 4 inch step 3x30 seconds  Heel raises x10 Toe raises x10  Tandem stance blue foam pad 3x30 seconds B  Tandem walks in // bars light BUE support x3 laps    01/10/24 NuStep L5 x 6 min Slant board Calf stretch 6in step ups x10 each  GOALS 4 way ankle Green x15 L ankle PROM w/ end range holds   STM to L plantar fascia Sit to stand LE on airex 2x10 Heel raises on airex  HS curls 20lb 2x12 Leg Ext 5lb 2x12        PATIENT EDUCATION:  Education details: exam findings, POC, HEP, encouraged WBAT, ice, elevation  Person educated: Patient Education method: Explanation, Demonstration, and Handouts Education comprehension: verbalized understanding, returned demonstration, and needs further education  HOME EXERCISE PROGRAM:  Access Code: [email or web address] URL: [email or web address] Date: 01/02/2024 Prepared by: Josette Rough  Exercises - Supine Ankle Pumps in Elevation on Pillows  - 2-3 x daily - 7 x weekly - 1 sets - 10 reps - Ankle Circles in Elevation  - 2-3 x daily - 7 x weekly - 1 sets - 10 reps - Ankle Alphabet in Elevation  - 2-3 x daily - 7 x weekly - 1 sets - 10 reps - Lateral Weight Shift with Parallel Bars (BKA)  - 1 x daily - 7 x weekly -  1 sets - 10 reps - 2 seconds  hold - Supine Active Straight Leg Raise  - 1 x daily - 7 x weekly - 2 sets - 10 reps - Sidelying Hip Abduction  - 1 x daily - 7 x weekly - 2 sets - 10 reps - Seated Long Arc Quad  - 1 x daily - 7 x weekly - 2 sets - 10 reps  ASSESSMENT:  CLINICAL IMPRESSION:  Pt Arrives doing OK, unfortunately still having ongoing pain and edema. Continued working on strength and ROM as tolerated today. Sone increase in L ankle AROM. L ankle appears to pronate under weight bearing. Pt still has a hard nodule in the arch of her foot.    OBJECTIVE IMPAIRMENTS: Abnormal gait, decreased activity  tolerance, decreased balance, decreased knowledge of use of DME, decreased mobility, difficulty walking, decreased ROM, decreased strength, and pain.   ACTIVITY LIMITATIONS: standing, stairs, transfers, and locomotion level  PARTICIPATION LIMITATIONS: meal prep, cleaning, laundry, driving, shopping, and community activity  PERSONAL FACTORS: Behavior pattern, Education, Fitness, Social background, and Time since onset of injury/illness/exacerbation are also affecting patient's functional outcome.   REHAB POTENTIAL: Good  CLINICAL DECISION MAKING: Stable/uncomplicated  EVALUATION COMPLEXITY: Low   GOALS: Goals reviewed with patient? No  SHORT TERM GOALS: Target date: 01/23/2024   Will be compliant with appropriate progressive HEP  Baseline: Goal status: Met 01/10/24  2.  L ankle AROM to improve to: -10* DF, PF to 65*, eversion to 12* Baseline:  Goal status: ONGOING 01/02/24 see above, Progressing 01/10/24  3.  Will be independent with appropriate edema management techniques  Baseline:  Goal status: Met 01/10/24  4.  Will be consistently ambulatory for household distances with LRAD  Baseline:  Goal status: Progressing 01/10/24    LONG TERM GOALS: Target date: 02/20/2024    MMT to be at least 4/5 L ankle  Baseline:  Goal status: Progressing 02/02/24  2.  L ankle AROM to be as follows: DF 10* Baseline:  Goal status: Progressing 02/02/24  3.  Will score at least 20/24 on DGI  Baseline:  Goal status: INITIAL  4.  Will be able to ambulate community distances with LRAD, L ankle pain no more than 3/10 Baseline:  Goal status: INITIAL  5.  PSFS to have improved by 3 points  Baseline:  Goal status: INITIAL     PLAN:  PT FREQUENCY: 2x/week  PT DURATION: 8 weeks  PLANNED INTERVENTIONS: 97750- Physical Performance Testing, 97110-Therapeutic exercises, 97530- Therapeutic activity, [id number]- Neuromuscular re-education, 97535- Self Care, [id number]- Manual therapy, 97116- Gait training,  97016- Vasopneumatic device, and Cryotherapy  PLAN FOR NEXT SESSION: WBAT per op notes, ROM and edema management, progressive loading and gait training as tolerated, progressive strengthening. Continue to encourage compression stockings   Tanda Sorrow, PTA 02/02/24 8:45 AM

## 2024-02-05 ENCOUNTER — Ambulatory Visit: Admitting: Physical Therapy

## 2024-02-05 ENCOUNTER — Encounter: Payer: Self-pay | Admitting: Physical Therapy

## 2024-02-05 DIAGNOSIS — R600 Localized edema: Secondary | ICD-10-CM

## 2024-02-05 DIAGNOSIS — M25572 Pain in left ankle and joints of left foot: Secondary | ICD-10-CM | POA: Diagnosis not present

## 2024-02-05 DIAGNOSIS — M25672 Stiffness of left ankle, not elsewhere classified: Secondary | ICD-10-CM

## 2024-02-05 DIAGNOSIS — R262 Difficulty in walking, not elsewhere classified: Secondary | ICD-10-CM | POA: Diagnosis not present

## 2024-02-05 DIAGNOSIS — R2681 Unsteadiness on feet: Secondary | ICD-10-CM | POA: Diagnosis not present

## 2024-02-05 NOTE — Therapy (Signed)
 OUTPATIENT PHYSICAL THERAPY LOWER EXTREMITY TREATMENT    Patient Name: Shelly Pearson MRN: [id number] DOB:1941/01/20, 83 y.o., female Today's Date: 02/05/2024  END OF SESSION:  PT End of Session - 02/05/24 1510     Visit Number 9    Date for PT Re-Evaluation 02/20/24    PT Start Time 1515    PT Stop Time 1600    PT Time Calculation (min) 45 min    Activity Tolerance Patient tolerated treatment well    Behavior During Therapy [facility] for tasks assessed/performed            Past Medical History:  Diagnosis Date   Allergy 60 or so years ago   Pollen, bandaids, pollen   Arthritis 20 or more years ago   Thumbs, feet, shoulder, back, etc.   B12 deficiency 03/02/2020   GERD (gastroesophageal reflux disease)    Hyperlipidemia Been on atorvastatin  for ? years   Hypertension    Seasonal allergies    Thyroid  disease    Urge incontinence of urine 12/10/2020   Urgency incontinence    Past Surgical History:  Procedure Laterality Date   ABDOMINAL HYSTERECTOMY     APPENDECTOMY  12/18/71   When daughter was born by c-section   CESAREAN SECTION     CYSTOCELE REPAIR     FRACTURE SURGERY     HARDWARE REMOVAL Left 12/19/2023   Procedure: REMOVAL, HARDWARE;  Surgeon: Genelle Standing, MD;  Location: [facility];  Service: Orthopedics;  Laterality: Left;  LEFT  ANKLE REMOVAL OF HARDWARE   ORIF ANKLE FRACTURE Left 06/08/2023   Procedure: LEFT OPEN REDUCTION INTERNAL FIXATION (ORIF) ANKLE FRACTURE;  Surgeon: Genelle Standing, MD;  Location: [facility];  Service: Orthopedics;  Laterality: Left;   ROTATOR CUFF REPAIR Right    2018   Patient Active Problem List   Diagnosis Date Noted   Pain from implanted hardware 12/19/2023   Status post open reduction with internal fixation (ORIF) of fracture of ankle- Left 10/06/2023   Arthritis of midtarsal joint of left foot 10/06/2023   Exudative age-related macular degeneration, right eye, with active choroidal neovascularization (HCC)  05/24/2022   Decreased grip strength of right hand 09/16/2021   Gait disturbance 07/12/2021   Mild cognitive impairment 07/12/2021   Thyroid  goiter 12/10/2020   Mixed hyperlipidemia 12/10/2020   Degenerative cervical spinal stenosis, mild C4-C5 and C5-C6 12/10/2020   Lumbar degenerative disc disease, L4-L5 with left lateral recess stenosis 12/10/2020   Personal history of fall 09/01/2020   Congenital cavus deformity of both feet 03/02/2020   Metatarsalgia of both feet 03/02/2020   Hand arthritis 03/02/2020   Arthritis of carpometacarpal (CMC) joint of both thumbs 09/19/2019   Overactive bladder 09/19/2019   Sciatica 04/26/2019   Paresthesia of both feet 02/04/2019   Epidermal cyst 04/17/2018   Allergic rhinitis 06/23/2015   Eczema 06/23/2015   Gastro-esophageal reflux disease without esophagitis 06/23/2015   Essential hypertension 06/23/2015   Hypothyroidism 06/23/2015    PCP: Thedora Senior MD   REFERRING PROVIDER: Genelle Standing, MD  REFERRING DIAG:  Diagnosis  [phone] (ICD-10-CM) - Bimalleolar ankle fracture, left, closed, initial encounter    THERAPY DIAG:  Pain in left ankle and joints of left foot  Difficulty in walking, not elsewhere classified  Localized edema  Stiffness of left ankle, not elsewhere classified  Rationale for Evaluation and Treatment: Rehabilitation  ONSET DATE: L ankle ORIF 06/2023, hardware removal 12/19/23  SUBJECTIVE:   SUBJECTIVE STATEMENT:   Yesterday and today has felt  the best that it has  EVAL:I was feeling pretty good after I finished PT, after that the right ankle I still had a lot of swelling and issues getting into shoes. It stayed tender and gave me some shooting pains down into the foot. Dr. Genelle said that it might help to take out the hardware. I have been careful with it, I know I can put weight on it but being careful when it hurts.  PERTINENT HISTORY:  See above in addition to the following from MD note:   6  months status post left ankle open reduction internal fixation. She is somewhat symptomatic about her medial malleolar screws. She is also having symptoms about her peroneal tendon insertions at the plantar aspect of the foot as well as issues with neuropathy which she is having on the other side as well. Given this I do believe she would benefit from a custom orthotic. I will plan to see her back as needed should she want to consider medial malleolar screw removal PAIN:  Are you having pain? Yes: NPRS scale: 0/10 Pain location: medial and lateral L ankle, even in toes, lump in arch of L foot Pain description: soreness and pulling, sharp pains and spasms sometimes   Aggravating factors: pressure/WB  Relieving factors: keeping it still and elevating   PRECAUTIONS: None  RED FLAGS: None   WEIGHT BEARING RESTRICTIONS:  weightbearing as tolerated per op note   FALLS:  Has patient fallen in last 6 months? No  LIVING ENVIRONMENT: Lives with: lives with their family Lives in: House/apartment Stairs: 4STE with rails, has second floor (steps with split landing) Has following equipment at home: Vannie - 2 wheeled and Wheelchair (manual)  OCCUPATION: retired- Microbiologist and flight attendant   PLOF: Independent, Independent with basic ADLs, Independent with gait, and Independent with transfers  PATIENT GOALS: be able to get around without sharp pains   NEXT MD VISIT: Dr. Genelle 01/03/24  OBJECTIVE:  Note: Objective measures were completed at Evaluation unless otherwise noted.    PATIENT SURVEYS:   THE PATIENT SPECIFIC FUNCTIONAL SCALE  Place score of 0-10 (0 = unable to perform activity and 10 = able to perform activity at the same level as before injury or problem)  Activity Date: Eval 12/26/23    Grocery shopping  0    2. Cleaning house  5    3. Cooking  2    4.      Total Score 2.3      Total Score = Sum of activity scores/number of activities  Minimally  Detectable Change: 3 points (for single activity); 2 points (for average score)  [name] (nd). The Patient Specific Functional Scale . Retrieved from [email or web address]   COGNITION: Overall cognitive status: hx of STM impairment      SENSATION: Not tested  EDEMA:   Appropriate for post-op status, good capillary refill       LOWER EXTREMITY ROM:  Active ROM Left eval Left 01/02/24 Left 01/10/24 Left  02/02/24  Hip flexion      Hip extension      Hip abduction      Hip adduction      Hip internal rotation      Hip external rotation      Knee flexion      Knee extension      Ankle dorsiflexion -25* -10* 0 5  Ankle plantarflexion 58* 61* 45 45  Ankle inversion 21* 26* 29 30  Ankle eversion 5* 7* 19 26   (Blank rows = not tested)  LOWER EXTREMITY MMT:  MMT Left eval Left 02/02/24  Hip flexion    Hip extension    Hip abduction    Hip adduction    Hip internal rotation    Hip external rotation    Knee flexion    Knee extension    Ankle dorsiflexion 3- 4  Ankle plantarflexion 3- 4  Ankle inversion  4  Ankle eversion     (Blank rows = not tested)   GAIT: Distance walked: deferred today due to pain/pulling at surgical site   Able to transfer and wt shift well with RW                                                                                                                                 TREATMENT DATE:  02/05/24 Bike L3 x 6 min Slant board calf stretch 5x10'' Heel raises from floor 3x10 6in step ups  forward & lateral  20lb resisted side steps x5 eacg Tmill pushes 3x20 steps  HS curls 20lb 2x12 Leg Ext 10lb 2x10 L ankle PROM w/ end range holds   02/02/24 Bike L3 x 6 min L ankle PROM w/ end range holds  4 way ankle Green x15 6in step ups  20lb resisted gait 4 way x 4  4in step downs  Stair training alternating pattern  01/23/24 Bike L 3 x 6 min 20lb Resisted gait 4 way x 3  each Slant board calf stretch 4in box on airex froward & lateral step ups x10 each  HS curls 20lb 2x12 Leg Ext 5lb 2x12 L ankle PROM w/ end range holds   STM to L plantar fascia  01/18/24 Nustep L5x8 minutes seat 6, BLEs only   Seated ankle PF/DF x20 on rockerboard Seated ankle eversion/inversion x20 rockerboad CW/CCW circles x20 each BAPS seated Gastroc stretch on slantboard 3x30 seconds Plantar fascia stretch off 4 inch step 3x30 seconds  Heel raises x10 Toe raises x10  Tandem stance blue foam pad 3x30 seconds B  Tandem walks in // bars light BUE support x3 laps    01/10/24 NuStep L5 x 6 min Slant board Calf stretch 6in step ups x10 each  GOALS 4 way ankle Green x15 L ankle PROM w/ end range holds   STM to L plantar fascia Sit to stand LE on airex 2x10 Heel raises on airex  HS curls 20lb 2x12 Leg Ext 5lb 2x12        PATIENT EDUCATION:  Education details: exam findings, POC, HEP, encouraged WBAT, ice, elevation  Person educated: Patient Education method: Explanation, Demonstration, and Handouts Education comprehension: verbalized understanding, returned demonstration, and needs further education  HOME EXERCISE PROGRAM:  Access Code: [email or web address] URL: [email or web address] Date: 01/02/2024 Prepared by: Josette Rough  Exercises - Supine Ankle Pumps in Elevation on Pillows  - 2-3 x daily - 7 x weekly - 1 sets -  10 reps - Ankle Circles in Elevation  - 2-3 x daily - 7 x weekly - 1 sets - 10 reps - Ankle Alphabet in Elevation  - 2-3 x daily - 7 x weekly - 1 sets - 10 reps - Lateral Weight Shift with Parallel Bars (BKA)  - 1 x daily - 7 x weekly - 1 sets - 10 reps - 2 seconds  hold - Supine Active Straight Leg Raise  - 1 x daily - 7 x weekly - 2 sets - 10 reps - Sidelying Hip Abduction  - 1 x daily - 7 x weekly - 2 sets - 10 reps - Seated Long Arc Quad  - 1 x daily - 7 x weekly - 2 sets - 10 reps  ASSESSMENT:  CLINICAL IMPRESSION:  Pt Arrives doing  OK, still having some edema but with less pain. Gain appears to be a little more fluid. Continued working on strength and ROM as tolerated today. Pt tolerated more aggressive interventions such as T-mill pushes and heel raises. PT stated she could feel it in her arch with resisted side steps.. L ankle appears to pronate under weight bearing, but could be uses to inflammation around medial malleolus.. Pt still has a hard nodule in the arch of her foot.    OBJECTIVE IMPAIRMENTS: Abnormal gait, decreased activity tolerance, decreased balance, decreased knowledge of use of DME, decreased mobility, difficulty walking, decreased ROM, decreased strength, and pain.   ACTIVITY LIMITATIONS: standing, stairs, transfers, and locomotion level  PARTICIPATION LIMITATIONS: meal prep, cleaning, laundry, driving, shopping, and community activity  PERSONAL FACTORS: Behavior pattern, Education, Fitness, Social background, and Time since onset of injury/illness/exacerbation are also affecting patient's functional outcome.   REHAB POTENTIAL: Good  CLINICAL DECISION MAKING: Stable/uncomplicated  EVALUATION COMPLEXITY: Low   GOALS: Goals reviewed with patient? No  SHORT TERM GOALS: Target date: 01/23/2024   Will be compliant with appropriate progressive HEP  Baseline: Goal status: Met 01/10/24  2.  L ankle AROM to improve to: -10* DF, PF to 65*, eversion to 12* Baseline:  Goal status: ONGOING 01/02/24 see above, Progressing 01/10/24  3.  Will be independent with appropriate edema management techniques  Baseline:  Goal status: Met 01/10/24  4.  Will be consistently ambulatory for household distances with LRAD  Baseline:  Goal status: Progressing 01/10/24    LONG TERM GOALS: Target date: 02/20/2024    MMT to be at least 4/5 L ankle  Baseline:  Goal status: Progressing 02/02/24  2.  L ankle AROM to be as follows: DF 10* Baseline:  Goal status: Progressing 02/02/24  3.  Will score at least 20/24 on DGI   Baseline:  Goal status: INITIAL  4.  Will be able to ambulate community distances with LRAD, L ankle pain no more than 3/10 Baseline:  Goal status: Progressing 02/05/24  5.  PSFS to have improved by 3 points  Baseline:  Goal status: INITIAL     PLAN:  PT FREQUENCY: 2x/week  PT DURATION: 8 weeks  PLANNED INTERVENTIONS: 97750- Physical Performance Testing, 97110-Therapeutic exercises, 97530- Therapeutic activity, [id number]- Neuromuscular re-education, 97535- Self Care, [id number]- Manual therapy, 97116- Gait training, 97016- Vasopneumatic device, and Cryotherapy  PLAN FOR NEXT SESSION: WBAT per op notes, ROM and edema management, progressive loading and gait training as tolerated, progressive strengthening. Continue to encourage compression stockings   Tanda Sorrow, PTA 02/05/24 3:11 PM

## 2024-02-06 NOTE — Progress Notes (Signed)
   Chief Complaint  Patient presents with   Foot Pain    Pt is here due left ankle pain that started in December after ankle surgery she states the ankle is sore to touch and is swollen after a long day.    HPI: 83 y.o. female presenting today for above complaint  Past Medical History:  Diagnosis Date   Allergy 60 or so years ago   Pollen, bandaids, pollen   Arthritis 20 or more years ago   Thumbs, feet, shoulder, back, etc.   B12 deficiency 03/02/2020   GERD (gastroesophageal reflux disease)    Hyperlipidemia Been on atorvastatin  for ? years   Hypertension    Seasonal allergies    Thyroid  disease    Urge incontinence of urine 12/10/2020   Urgency incontinence     Past Surgical History:  Procedure Laterality Date   ABDOMINAL HYSTERECTOMY     APPENDECTOMY  12/18/71   When daughter was born by c-section   CESAREAN SECTION     CYSTOCELE REPAIR     FRACTURE SURGERY     HARDWARE REMOVAL Left 12/19/2023   Procedure: REMOVAL, HARDWARE;  Surgeon: Genelle Standing, MD;  Location: [facility];  Service: Orthopedics;  Laterality: Left;  LEFT  ANKLE REMOVAL OF HARDWARE   ORIF ANKLE FRACTURE Left 06/08/2023   Procedure: LEFT OPEN REDUCTION INTERNAL FIXATION (ORIF) ANKLE FRACTURE;  Surgeon: Genelle Standing, MD;  Location: [facility];  Service: Orthopedics;  Laterality: Left;   ROTATOR CUFF REPAIR Right    2018    Allergies  Allergen Reactions   Adhesive  [Tape] Rash     Physical Exam: General: The patient is alert and oriented x3 in no acute distress.  Dermatology: Skin is warm, dry and supple bilateral lower extremities.   Vascular: Palpable pedal pulses bilaterally. Capillary refill within normal limits.  No erythema or concern for clinical infection.  Moderate edema noted  Neurological: Grossly intact via light touch  Musculoskeletal Exam: Tenderness with palpation especially around the medial aspect of the tibiotalar joint and medial malleolus  Radiographic  Exam LT ankle 01/29/2024:  Fibular plate with screw fixation noted along the distal portion of the fibula without complication.  Tibiotalar joint congruent.  There is some irregularity around the medial malleolus and lateral gutter of the talus possibly consistent with onset of posttraumatic arthritis  Assessment/Plan of Care: 1. H/o ORIF LT ankle fx. DOS: 06/08/2023 w/ subsequent ROH medial ankle  -Patient evaluated.  X-rays reviewed -Recommend continued conservative treatment and management. -NSAIDs as needed -Continue physical therapy -Recommend daily compression -If she continues to have persistent pain recommend possible CT to better evaluate the tibiotalar joint for onset of posttraumatic arthritis. MRI will likely have too much refraction from fibular hardware -Return to clinic PRN        Thresa EMERSON Sar, DPM [facility]  Dr. Thresa EMERSON Sar, DPM    [address] [place], [place] [address]                Office [phone]  Fax [phone]

## 2024-02-08 ENCOUNTER — Ambulatory Visit: Admitting: Physical Therapy

## 2024-02-08 ENCOUNTER — Encounter: Payer: Self-pay | Admitting: Physical Therapy

## 2024-02-08 DIAGNOSIS — M25672 Stiffness of left ankle, not elsewhere classified: Secondary | ICD-10-CM | POA: Diagnosis not present

## 2024-02-08 DIAGNOSIS — R600 Localized edema: Secondary | ICD-10-CM | POA: Diagnosis not present

## 2024-02-08 DIAGNOSIS — M25572 Pain in left ankle and joints of left foot: Secondary | ICD-10-CM | POA: Diagnosis not present

## 2024-02-08 DIAGNOSIS — R262 Difficulty in walking, not elsewhere classified: Secondary | ICD-10-CM | POA: Diagnosis not present

## 2024-02-08 DIAGNOSIS — R2681 Unsteadiness on feet: Secondary | ICD-10-CM

## 2024-02-08 NOTE — Therapy (Signed)
 OUTPATIENT PHYSICAL THERAPY LOWER EXTREMITY TREATMENT   Progress Note Reporting Period 12/26/23 to 02/08/24  See note below for Objective Data and Assessment of Progress/Goals.     Patient Name: Shelly Pearson MRN: [id number] DOB:1941-01-18, 83 y.o., female Today's Date: 02/08/2024  END OF SESSION:  PT End of Session - 02/08/24 1138     Visit Number 10    Date for PT Re-Evaluation 02/20/24    PT Start Time 1140    PT Stop Time 1225    PT Time Calculation (min) 45 min    Activity Tolerance Patient tolerated treatment well    Behavior During Therapy [facility] for tasks assessed/performed            Past Medical History:  Diagnosis Date   Allergy 60 or so years ago   Pollen, bandaids, pollen   Arthritis 20 or more years ago   Thumbs, feet, shoulder, back, etc.   B12 deficiency 03/02/2020   GERD (gastroesophageal reflux disease)    Hyperlipidemia Been on atorvastatin  for ? years   Hypertension    Seasonal allergies    Thyroid  disease    Urge incontinence of urine 12/10/2020   Urgency incontinence    Past Surgical History:  Procedure Laterality Date   ABDOMINAL HYSTERECTOMY     APPENDECTOMY  12/18/71   When daughter was born by c-section   CESAREAN SECTION     CYSTOCELE REPAIR     FRACTURE SURGERY     HARDWARE REMOVAL Left 12/19/2023   Procedure: REMOVAL, HARDWARE;  Surgeon: Genelle Standing, MD;  Location: [facility];  Service: Orthopedics;  Laterality: Left;  LEFT  ANKLE REMOVAL OF HARDWARE   ORIF ANKLE FRACTURE Left 06/08/2023   Procedure: LEFT OPEN REDUCTION INTERNAL FIXATION (ORIF) ANKLE FRACTURE;  Surgeon: Genelle Standing, MD;  Location: [facility];  Service: Orthopedics;  Laterality: Left;   ROTATOR CUFF REPAIR Right    2018   Patient Active Problem List   Diagnosis Date Noted   Pain from implanted hardware 12/19/2023   Status post open reduction with internal fixation (ORIF) of fracture of ankle- Left 10/06/2023   Arthritis of midtarsal joint of  left foot 10/06/2023   Exudative age-related macular degeneration, right eye, with active choroidal neovascularization (HCC) 05/24/2022   Decreased grip strength of right hand 09/16/2021   Gait disturbance 07/12/2021   Mild cognitive impairment 07/12/2021   Thyroid  goiter 12/10/2020   Mixed hyperlipidemia 12/10/2020   Degenerative cervical spinal stenosis, mild C4-C5 and C5-C6 12/10/2020   Lumbar degenerative disc disease, L4-L5 with left lateral recess stenosis 12/10/2020   Personal history of fall 09/01/2020   Congenital cavus deformity of both feet 03/02/2020   Metatarsalgia of both feet 03/02/2020   Hand arthritis 03/02/2020   Arthritis of carpometacarpal (CMC) joint of both thumbs 09/19/2019   Overactive bladder 09/19/2019   Sciatica 04/26/2019   Paresthesia of both feet 02/04/2019   Epidermal cyst 04/17/2018   Allergic rhinitis 06/23/2015   Eczema 06/23/2015   Gastro-esophageal reflux disease without esophagitis 06/23/2015   Essential hypertension 06/23/2015   Hypothyroidism 06/23/2015    PCP: Thedora Senior MD   REFERRING PROVIDER: Genelle Standing, MD  REFERRING DIAG:  Diagnosis  [phone] (ICD-10-CM) - Bimalleolar ankle fracture, left, closed, initial encounter    THERAPY DIAG:  Pain in left ankle and joints of left foot  Difficulty in walking, not elsewhere classified  Localized edema  Stiffness of left ankle, not elsewhere classified  Unsteadiness on feet  Rationale for Evaluation and  Treatment: Rehabilitation  ONSET DATE: L ankle ORIF 06/2023, hardware removal 12/19/23  SUBJECTIVE:   SUBJECTIVE STATEMENT:   Its not bothering me too much today, but I'm not doing much with it  EVAL:I was feeling pretty good after I finished PT, after that the right ankle I still had a lot of swelling and issues getting into shoes. It stayed tender and gave me some shooting pains down into the foot. Dr. Genelle said that it might help to take out the hardware. I have been  careful with it, I know I can put weight on it but being careful when it hurts.  PERTINENT HISTORY:  See above in addition to the following from MD note:   6 months status post left ankle open reduction internal fixation. She is somewhat symptomatic about her medial malleolar screws. She is also having symptoms about her peroneal tendon insertions at the plantar aspect of the foot as well as issues with neuropathy which she is having on the other side as well. Given this I do believe she would benefit from a custom orthotic. I will plan to see her back as needed should she want to consider medial malleolar screw removal PAIN:  Are you having pain? Yes: NPRS scale: 0/10 Pain location: medial and lateral L ankle, even in toes, lump in arch of L foot Pain description: soreness and pulling, sharp pains and spasms sometimes   Aggravating factors: pressure/WB  Relieving factors: keeping it still and elevating   PRECAUTIONS: None  RED FLAGS: None   WEIGHT BEARING RESTRICTIONS:  weightbearing as tolerated per op note   FALLS:  Has patient fallen in last 6 months? No  LIVING ENVIRONMENT: Lives with: lives with their family Lives in: House/apartment Stairs: 4STE with rails, has second floor (steps with split landing) Has following equipment at home: Vannie - 2 wheeled and Wheelchair (manual)  OCCUPATION: retired- Microbiologist and flight attendant   PLOF: Independent, Independent with basic ADLs, Independent with gait, and Independent with transfers  PATIENT GOALS: be able to get around without sharp pains   NEXT MD VISIT: Dr. Genelle 01/03/24  OBJECTIVE:  Note: Objective measures were completed at Evaluation unless otherwise noted.    PATIENT SURVEYS:   THE PATIENT SPECIFIC FUNCTIONAL SCALE  Place score of 0-10 (0 = unable to perform activity and 10 = able to perform activity at the same level as before injury or problem)  Activity Date: Eval 12/26/23    Grocery  shopping  0    2. Cleaning house  5    3. Cooking  2    4.      Total Score 2.3      Total Score = Sum of activity scores/number of activities  Minimally Detectable Change: 3 points (for single activity); 2 points (for average score)  [name] (nd). The Patient Specific Functional Scale . Retrieved from [email or web address]   COGNITION: Overall cognitive status: hx of STM impairment      SENSATION: Not tested  EDEMA:   Appropriate for post-op status, good capillary refill       LOWER EXTREMITY ROM:  Active ROM Left eval Left 01/02/24 Left 01/10/24 Left  02/02/24 Left 02/08/24  Hip flexion       Hip extension       Hip abduction       Hip adduction       Hip internal rotation       Hip external rotation  Knee flexion       Knee extension       Ankle dorsiflexion -25* -10* 0 5 9  Ankle plantarflexion 58* 61* 45 45 45  Ankle inversion 21* 26* 29 30 34  Ankle eversion 5* 7* 19 26 23    (Blank rows = not tested)  LOWER EXTREMITY MMT:  MMT Left eval Left 02/02/24  Hip flexion    Hip extension    Hip abduction    Hip adduction    Hip internal rotation    Hip external rotation    Knee flexion    Knee extension    Ankle dorsiflexion 3- 4  Ankle plantarflexion 3- 4  Ankle inversion  4  Ankle eversion     (Blank rows = not tested)   GAIT: Distance walked: deferred today due to pain/pulling at surgical site   Able to transfer and wt shift well with RW                                                                                                                                 TREATMENT DATE:  02/08/24 Bike L3 x 7 min Slant board calf stretch  Tmill pushes  L ankle PROM with end range holds  L ankle Jt mobs 4in box on airex step ups  forward & lateral  Heel raises from floor  HS curls 20lb 2x15 Leg Ext 10lb 2x12 20lb side steps overr WaTE bar  Sit to stand LE on airex  02/05/24 Bike  L3 x 6 min Slant board calf stretch 5x10'' Heel raises from floor 3x10 6in step ups  forward & lateral  20lb resisted side steps x5 eacg Tmill pushes 3x20 steps  HS curls 20lb 2x12 Leg Ext 10lb 2x10 L ankle PROM w/ end range holds   02/02/24 Bike L3 x 6 min L ankle PROM w/ end range holds  4 way ankle Green x15 6in step ups  20lb resisted gait 4 way x 4  4in step downs  Stair training alternating pattern  01/23/24 Bike L 3 x 6 min 20lb Resisted gait 4 way x 3 each Slant board calf stretch 4in box on airex froward & lateral step ups x10 each  HS curls 20lb 2x12 Leg Ext 5lb 2x12 L ankle PROM w/ end range holds   STM to L plantar fascia  01/18/24 Nustep L5x8 minutes seat 6, BLEs only   Seated ankle PF/DF x20 on rockerboard Seated ankle eversion/inversion x20 rockerboad CW/CCW circles x20 each BAPS seated Gastroc stretch on slantboard 3x30 seconds Plantar fascia stretch off 4 inch step 3x30 seconds  Heel raises x10 Toe raises x10  Tandem stance blue foam pad 3x30 seconds B  Tandem walks in // bars light BUE support x3 laps    01/10/24 NuStep L5 x 6 min Slant board Calf stretch 6in step ups x10 each  GOALS 4 way ankle Green x15 L ankle PROM w/ end range holds  STM to L plantar fascia Sit to stand LE on airex 2x10 Heel raises on airex  HS curls 20lb 2x12 Leg Ext 5lb 2x12        PATIENT EDUCATION:  Education details: exam findings, POC, HEP, encouraged WBAT, ice, elevation  Person educated: Patient Education method: Explanation, Demonstration, and Handouts Education comprehension: verbalized understanding, returned demonstration, and needs further education  HOME EXERCISE PROGRAM:  Access Code: [email or web address] URL: [email or web address] Date: 01/02/2024 Prepared by: Josette Rough  Exercises - Supine Ankle Pumps in Elevation on Pillows  - 2-3 x daily - 7 x weekly - 1 sets - 10 reps - Ankle Circles in Elevation  - 2-3 x daily - 7 x weekly - 1 sets  - 10 reps - Ankle Alphabet in Elevation  - 2-3 x daily - 7 x weekly - 1 sets - 10 reps - Lateral Weight Shift with Parallel Bars (BKA)  - 1 x daily - 7 x weekly - 1 sets - 10 reps - 2 seconds  hold - Supine Active Straight Leg Raise  - 1 x daily - 7 x weekly - 2 sets - 10 reps - Sidelying Hip Abduction  - 1 x daily - 7 x weekly - 2 sets - 10 reps - Seated Long Arc Quad  - 1 x daily - 7 x weekly - 2 sets - 10 reps  ASSESSMENT:  CLINICAL IMPRESSION:  Pt arrives doing OK. She has progressed towards LTG's. Continued working on strength and ROM as tolerated today. Pt stated she could feel it in her arch with heel raises. L ankle appears to pronate under weight bearing, but could be uses to inflammation around medial malleolus.. Pt still has a hard nodule in the arch of her foot.    OBJECTIVE IMPAIRMENTS: Abnormal gait, decreased activity tolerance, decreased balance, decreased knowledge of use of DME, decreased mobility, difficulty walking, decreased ROM, decreased strength, and pain.   ACTIVITY LIMITATIONS: standing, stairs, transfers, and locomotion level  PARTICIPATION LIMITATIONS: meal prep, cleaning, laundry, driving, shopping, and community activity  PERSONAL FACTORS: Behavior pattern, Education, Fitness, Social background, and Time since onset of injury/illness/exacerbation are also affecting patient's functional outcome.   REHAB POTENTIAL: Good  CLINICAL DECISION MAKING: Stable/uncomplicated  EVALUATION COMPLEXITY: Low   GOALS: Goals reviewed with patient? No  SHORT TERM GOALS: Target date: 01/23/2024   Will be compliant with appropriate progressive HEP  Baseline: Goal status: Met 01/10/24  2.  L ankle AROM to improve to: -10* DF, PF to 65*, eversion to 12* Baseline:  Goal status: ONGOING 01/02/24 see above, Progressing 01/10/24, Progressing 02/08/24  3.  Will be independent with appropriate edema management techniques  Baseline:  Goal status: Met 01/10/24  4.  Will be  consistently ambulatory for household distances with LRAD  Baseline:  Goal status: Progressing 01/10/24, Partly met slight limp 02/08/24    LONG TERM GOALS: Target date: 02/20/2024    MMT to be at least 4/5 L ankle  Baseline:  Goal status: Progressing 02/02/24  2.  L ankle AROM to be as follows: DF 10* Baseline:  Goal status: Progressing 02/02/24, progressing 02/08/24  3.  Will score at least 20/24 on DGI  Baseline:  Goal status: INITIAL  4.  Will be able to ambulate community distances with LRAD, L ankle pain no more than 3/10 Baseline:  Goal status: Progressing 02/05/24  5.  PSFS to have improved by 3 points  Baseline:  Goal status: INITIAL     PLAN:  PT FREQUENCY: 2x/week  PT DURATION: 8 weeks  PLANNED INTERVENTIONS: 97750- Physical Performance Testing, 97110-Therapeutic exercises, 97530- Therapeutic activity, [id number]- Neuromuscular re-education, 97535- Self Care, [id number]- Manual therapy, 97116- Gait training, 97016- Vasopneumatic device, and Cryotherapy  PLAN FOR NEXT SESSION: WBAT per op notes, ROM and edema management, progressive loading and gait training as tolerated, progressive strengthening. Continue to encourage compression stockings   Tanda Sorrow, PTA 02/08/24 11:39 AM

## 2024-02-12 ENCOUNTER — Ambulatory Visit: Admitting: Physical Therapy

## 2024-02-12 ENCOUNTER — Encounter: Payer: Self-pay | Admitting: Physical Therapy

## 2024-02-12 DIAGNOSIS — R600 Localized edema: Secondary | ICD-10-CM

## 2024-02-12 DIAGNOSIS — M25672 Stiffness of left ankle, not elsewhere classified: Secondary | ICD-10-CM

## 2024-02-12 DIAGNOSIS — R2681 Unsteadiness on feet: Secondary | ICD-10-CM

## 2024-02-12 DIAGNOSIS — M25572 Pain in left ankle and joints of left foot: Secondary | ICD-10-CM

## 2024-02-12 DIAGNOSIS — R262 Difficulty in walking, not elsewhere classified: Secondary | ICD-10-CM | POA: Diagnosis not present

## 2024-02-12 NOTE — Therapy (Signed)
 OUTPATIENT PHYSICAL THERAPY LOWER EXTREMITY TREATMENT  Patient Name: Shelly Pearson MRN: [id number] DOB:[birth year], 83 y.o., female Today's Date: 02/12/2024  END OF SESSION:  PT End of Session - 02/12/24 0929     Visit Number 11    Date for PT Re-Evaluation 02/20/24    PT Start Time 0930    PT Stop Time 1015    PT Time Calculation (min) 45 min    Activity Tolerance Patient tolerated treatment well    Behavior During Therapy [facility] for tasks assessed/performed            Past Medical History:  Diagnosis Date   Allergy 60 or so years ago   Pollen, bandaids, pollen   Arthritis 20 or more years ago   Thumbs, feet, shoulder, back, etc.   B12 deficiency 03/02/2020   GERD (gastroesophageal reflux disease)    Hyperlipidemia Been on atorvastatin  for ? years   Hypertension    Seasonal allergies    Thyroid  disease    Urge incontinence of urine 12/10/2020   Urgency incontinence    Past Surgical History:  Procedure Laterality Date   ABDOMINAL HYSTERECTOMY     APPENDECTOMY  12/18/71   When daughter was born by c-section   CESAREAN SECTION     CYSTOCELE REPAIR     FRACTURE SURGERY     HARDWARE REMOVAL Left 12/19/2023   Procedure: REMOVAL, HARDWARE;  Surgeon: Genelle Standing, MD;  Location: [facility];  Service: Orthopedics;  Laterality: Left;  LEFT  ANKLE REMOVAL OF HARDWARE   ORIF ANKLE FRACTURE Left 06/08/2023   Procedure: LEFT OPEN REDUCTION INTERNAL FIXATION (ORIF) ANKLE FRACTURE;  Surgeon: Genelle Standing, MD;  Location: [facility];  Service: Orthopedics;  Laterality: Left;   ROTATOR CUFF REPAIR Right    2018   Patient Active Problem List   Diagnosis Date Noted   Pain from implanted hardware 12/19/2023   Status post open reduction with internal fixation (ORIF) of fracture of ankle- Left 10/06/2023   Arthritis of midtarsal joint of left foot 10/06/2023   Exudative age-related macular degeneration, right eye, with active choroidal neovascularization (HCC)  05/24/2022   Decreased grip strength of right hand 09/16/2021   Gait disturbance 07/12/2021   Mild cognitive impairment 07/12/2021   Thyroid  goiter 12/10/2020   Mixed hyperlipidemia 12/10/2020   Degenerative cervical spinal stenosis, mild C4-C5 and C5-C6 12/10/2020   Lumbar degenerative disc disease, L4-L5 with left lateral recess stenosis 12/10/2020   Personal history of fall 09/01/2020   Congenital cavus deformity of both feet 03/02/2020   Metatarsalgia of both feet 03/02/2020   Hand arthritis 03/02/2020   Arthritis of carpometacarpal (CMC) joint of both thumbs 09/19/2019   Overactive bladder 09/19/2019   Sciatica 04/26/2019   Paresthesia of both feet 02/04/2019   Epidermal cyst 04/17/2018   Allergic rhinitis 06/23/2015   Eczema 06/23/2015   Gastro-esophageal reflux disease without esophagitis 06/23/2015   Essential hypertension 06/23/2015   Hypothyroidism 06/23/2015    PCP: Thedora Senior MD   REFERRING PROVIDER: Genelle Standing, MD  REFERRING DIAG:  Diagnosis  [phone] (ICD-10-CM) - Bimalleolar ankle fracture, left, closed, initial encounter    THERAPY DIAG:  Pain in left ankle and joints of left foot  Difficulty in walking, not elsewhere classified  Stiffness of left ankle, not elsewhere classified  Unsteadiness on feet  Localized edema  Rationale for Evaluation and Treatment: Rehabilitation  ONSET DATE: L ankle ORIF 06/2023, hardware removal 12/19/23  SUBJECTIVE:   SUBJECTIVE STATEMENT:   Doing fine, ankle  is ok, having arch issues, will be seeing new doc once paper work gets transferred over  EVAL:I was feeling pretty good after I finished PT, after that the right ankle I still had a lot of swelling and issues getting into shoes. It stayed tender and gave me some shooting pains down into the foot. Dr. Genelle said that it might help to take out the hardware. I have been careful with it, I know I can put weight on it but being careful when it  hurts.  PERTINENT HISTORY:  See above in addition to the following from MD note:   6 months status post left ankle open reduction internal fixation. She is somewhat symptomatic about her medial malleolar screws. She is also having symptoms about her peroneal tendon insertions at the plantar aspect of the foot as well as issues with neuropathy which she is having on the other side as well. Given this I do believe she would benefit from a custom orthotic. I will plan to see her back as needed should she want to consider medial malleolar screw removal PAIN:  Are you having pain? Yes: NPRS scale: 0/10 Pain location: medial and lateral L ankle, even in toes, lump in arch of L foot Pain description: soreness and pulling, sharp pains and spasms sometimes   Aggravating factors: pressure/WB  Relieving factors: keeping it still and elevating   PRECAUTIONS: None  RED FLAGS: None   WEIGHT BEARING RESTRICTIONS:  weightbearing as tolerated per op note   FALLS:  Has patient fallen in last 6 months? No  LIVING ENVIRONMENT: Lives with: lives with their family Lives in: House/apartment Stairs: 4STE with rails, has second floor (steps with split landing) Has following equipment at home: Vannie - 2 wheeled and Wheelchair (manual)  OCCUPATION: retired- Microbiologist and flight attendant   PLOF: Independent, Independent with basic ADLs, Independent with gait, and Independent with transfers  PATIENT GOALS: be able to get around without sharp pains   NEXT MD VISIT: Dr. Genelle 01/03/24  OBJECTIVE:  Note: Objective measures were completed at Evaluation unless otherwise noted.    PATIENT SURVEYS:   THE PATIENT SPECIFIC FUNCTIONAL SCALE  Place score of 0-10 (0 = unable to perform activity and 10 = able to perform activity at the same level as before injury or problem)  Activity Date: Eval 12/26/23    Grocery shopping  0    2. Cleaning house  5    3. Cooking  2    4.      Total  Score 2.3      Total Score = Sum of activity scores/number of activities  Minimally Detectable Change: 3 points (for single activity); 2 points (for average score)  [name] (nd). The Patient Specific Functional Scale . Retrieved from [email or web address]   COGNITION: Overall cognitive status: hx of STM impairment      SENSATION: Not tested  EDEMA:   Appropriate for post-op status, good capillary refill       LOWER EXTREMITY ROM:  Active ROM Left eval Left 01/02/24 Left 01/10/24 Left  02/02/24 Left 02/08/24  Hip flexion       Hip extension       Hip abduction       Hip adduction       Hip internal rotation       Hip external rotation       Knee flexion       Knee extension  Ankle dorsiflexion -25* -10* 0 5 9  Ankle plantarflexion 58* 61* 45 45 45  Ankle inversion 21* 26* 29 30 34  Ankle eversion 5* 7* 19 26 23    (Blank rows = not tested)  LOWER EXTREMITY MMT:  MMT Left eval Left 02/02/24  Hip flexion    Hip extension    Hip abduction    Hip adduction    Hip internal rotation    Hip external rotation    Knee flexion    Knee extension    Ankle dorsiflexion 3- 4  Ankle plantarflexion 3- 4  Ankle inversion  4  Ankle eversion     (Blank rows = not tested)   GAIT: Distance walked: deferred today due to pain/pulling at surgical site   Able to transfer and wt shift well with RW                                                                                                                                 TREATMENT DATE:  02/12/24 Bike L 3 x 6 min 30lb resisted gait 4 way x 3 each 8in step ups form airex forward & lateral x10 Heel raised form floor 2x10 Toe raises black bar 2x10  Leg press 30lb 2x12 4 way ankle blue x15 L ankle PROM with end range holds  L ankle Jt mobs  02/08/24 Bike L3 x 7 min Slant board calf stretch  Tmill pushes  L ankle PROM with end range holds  L ankle Jt  mobs 4in box on airex step ups  forward & lateral  Heel raises from floor  HS curls 20lb 2x15 Leg Ext 10lb 2x12 20lb side steps overr WaTE bar  Sit to stand LE on airex  02/05/24 Bike L3 x 6 min Slant board calf stretch 5x10'' Heel raises from floor 3x10 6in step ups  forward & lateral  20lb resisted side steps x5 eacg Tmill pushes 3x20 steps  HS curls 20lb 2x12 Leg Ext 10lb 2x10 L ankle PROM w/ end range holds   02/02/24 Bike L3 x 6 min L ankle PROM w/ end range holds  4 way ankle Green x15 6in step ups  20lb resisted gait 4 way x 4  4in step downs  Stair training alternating pattern  01/23/24 Bike L 3 x 6 min 20lb Resisted gait 4 way x 3 each Slant board calf stretch 4in box on airex froward & lateral step ups x10 each  HS curls 20lb 2x12 Leg Ext 5lb 2x12 L ankle PROM w/ end range holds   STM to L plantar fascia  01/18/24 Nustep L5x8 minutes seat 6, BLEs only   Seated ankle PF/DF x20 on rockerboard Seated ankle eversion/inversion x20 rockerboad CW/CCW circles x20 each BAPS seated Gastroc stretch on slantboard 3x30 seconds Plantar fascia stretch off 4 inch step 3x30 seconds  Heel raises x10 Toe raises x10  Tandem stance blue foam pad 3x30 seconds B  Tandem  walks in // bars light BUE support x3 laps    01/10/24 NuStep L5 x 6 min Slant board Calf stretch 6in step ups x10 each  GOALS 4 way ankle Green x15 L ankle PROM w/ end range holds   STM to L plantar fascia Sit to stand LE on airex 2x10 Heel raises on airex  HS curls 20lb 2x12 Leg Ext 5lb 2x12        PATIENT EDUCATION:  Education details: exam findings, POC, HEP, encouraged WBAT, ice, elevation  Person educated: Patient Education method: Explanation, Demonstration, and Handouts Education comprehension: verbalized understanding, returned demonstration, and needs further education  HOME EXERCISE PROGRAM:  Access Code: [email or web address] URL: [email or web address] Date: 01/02/2024 Prepared  by: Josette Rough  Exercises - Supine Ankle Pumps in Elevation on Pillows  - 2-3 x daily - 7 x weekly - 1 sets - 10 reps - Ankle Circles in Elevation  - 2-3 x daily - 7 x weekly - 1 sets - 10 reps - Ankle Alphabet in Elevation  - 2-3 x daily - 7 x weekly - 1 sets - 10 reps - Lateral Weight Shift with Parallel Bars (BKA)  - 1 x daily - 7 x weekly - 1 sets - 10 reps - 2 seconds  hold - Supine Active Straight Leg Raise  - 1 x daily - 7 x weekly - 2 sets - 10 reps - Sidelying Hip Abduction  - 1 x daily - 7 x weekly - 2 sets - 10 reps - Seated Long Arc Quad  - 1 x daily - 7 x weekly - 2 sets - 10 reps  ASSESSMENT:  CLINICAL IMPRESSION:  Again pt arrives doing OK. Increase resistance tolerated with resisted gait. Continued working on strength and ROM as tolerated today. No issues with arch completing heel raises. L ankle appears to pronate under weight bearing, but could be due to medial malleolus appearing larger. Pt still has a hard nodule in the arch of her foot.    OBJECTIVE IMPAIRMENTS: Abnormal gait, decreased activity tolerance, decreased balance, decreased knowledge of use of DME, decreased mobility, difficulty walking, decreased ROM, decreased strength, and pain.   ACTIVITY LIMITATIONS: standing, stairs, transfers, and locomotion level  PARTICIPATION LIMITATIONS: meal prep, cleaning, laundry, driving, shopping, and community activity  PERSONAL FACTORS: Behavior pattern, Education, Fitness, Social background, and Time since onset of injury/illness/exacerbation are also affecting patient's functional outcome.   REHAB POTENTIAL: Good  CLINICAL DECISION MAKING: Stable/uncomplicated  EVALUATION COMPLEXITY: Low   GOALS: Goals reviewed with patient? No  SHORT TERM GOALS: Target date: 01/23/2024   Will be compliant with appropriate progressive HEP  Baseline: Goal status: Met 01/10/24  2.  L ankle AROM to improve to: -10* DF, PF to 65*, eversion to 12* Baseline:  Goal status:  ONGOING 01/02/24 see above, Progressing 01/10/24, Progressing 02/08/24  3.  Will be independent with appropriate edema management techniques  Baseline:  Goal status: Met 01/10/24  4.  Will be consistently ambulatory for household distances with LRAD  Baseline:  Goal status: Progressing 01/10/24, Partly met slight limp 02/08/24    LONG TERM GOALS: Target date: 02/20/2024    MMT to be at least 4/5 L ankle  Baseline:  Goal status: Progressing 02/02/24  2.  L ankle AROM to be as follows: DF 10* Baseline:  Goal status: Progressing 02/02/24, progressing 02/08/24  3.  Will score at least 20/24 on DGI  Baseline:  Goal status: INITIAL  4.  Will be able to ambulate community distances  with LRAD, L ankle pain no more than 3/10 Baseline:  Goal status: Progressing 02/05/24  5.  PSFS to have improved by 3 points  Baseline:  Goal status: INITIAL     PLAN:  PT FREQUENCY: 2x/week  PT DURATION: 8 weeks  PLANNED INTERVENTIONS: 97750- Physical Performance Testing, 97110-Therapeutic exercises, 97530- Therapeutic activity, [id number]- Neuromuscular re-education, 97535- Self Care, [id number]- Manual therapy, 97116- Gait training, 97016- Vasopneumatic device, and Cryotherapy  PLAN FOR NEXT SESSION: WBAT per op notes, ROM and edema management, progressive loading and gait training as tolerated, progressive strengthening. Continue to encourage compression stockings   Tanda Sorrow, PTA 02/12/24 9:29 AM

## 2024-02-15 ENCOUNTER — Ambulatory Visit: Admitting: Physical Therapy

## 2024-02-15 ENCOUNTER — Encounter: Payer: Self-pay | Admitting: Physical Therapy

## 2024-02-15 DIAGNOSIS — R262 Difficulty in walking, not elsewhere classified: Secondary | ICD-10-CM

## 2024-02-15 DIAGNOSIS — M25572 Pain in left ankle and joints of left foot: Secondary | ICD-10-CM | POA: Diagnosis not present

## 2024-02-15 DIAGNOSIS — R2681 Unsteadiness on feet: Secondary | ICD-10-CM | POA: Diagnosis not present

## 2024-02-15 DIAGNOSIS — R600 Localized edema: Secondary | ICD-10-CM

## 2024-02-15 DIAGNOSIS — M25672 Stiffness of left ankle, not elsewhere classified: Secondary | ICD-10-CM

## 2024-02-15 NOTE — Progress Notes (Signed)
   02/15/24 0001  Standardized Balance Assessment  Standardized Balance Assessment Dynamic Gait Index  Dynamic Gait Index  Level Surface 3  Change in Gait Speed 3  Gait with Horizontal Head Turns 2  Gait with Vertical Head Turns 2  Gait and Pivot Turn 2  Step Over Obstacle 2  Step Around Obstacles 2  Steps 2  Total Score 18

## 2024-02-15 NOTE — Therapy (Signed)
 OUTPATIENT PHYSICAL THERAPY LOWER EXTREMITY TREATMENT/RECERT   Patient Name: Shelly Pearson MRN: [id number] DOB:1940/08/18, 83 y.o., female Today's Date: 02/15/2024  END OF SESSION:  PT End of Session - 02/15/24 0912     Visit Number 13    Number of Visits 25    Date for PT Re-Evaluation 03/28/24    Authorization Type MCR and USAA    Authorization Time Period 12/26/23 to 02/20/24; extended to 03/28/24    Progress Note Due on Visit 20    PT Start Time 0847    PT Stop Time 0925    PT Time Calculation (min) 38 min    Activity Tolerance Patient tolerated treatment well    Behavior During Therapy [facility] for tasks assessed/performed             Past Medical History:  Diagnosis Date   Allergy 60 or so years ago   Pollen, bandaids, pollen   Arthritis 20 or more years ago   Thumbs, feet, shoulder, back, etc.   B12 deficiency 03/02/2020   GERD (gastroesophageal reflux disease)    Hyperlipidemia Been on atorvastatin  for ? years   Hypertension    Seasonal allergies    Thyroid  disease    Urge incontinence of urine 12/10/2020   Urgency incontinence    Past Surgical History:  Procedure Laterality Date   ABDOMINAL HYSTERECTOMY     APPENDECTOMY  12/18/71   When daughter was born by c-section   CESAREAN SECTION     CYSTOCELE REPAIR     FRACTURE SURGERY     HARDWARE REMOVAL Left 12/19/2023   Procedure: REMOVAL, HARDWARE;  Surgeon: Genelle Standing, MD;  Location: [facility];  Service: Orthopedics;  Laterality: Left;  LEFT  ANKLE REMOVAL OF HARDWARE   ORIF ANKLE FRACTURE Left 06/08/2023   Procedure: LEFT OPEN REDUCTION INTERNAL FIXATION (ORIF) ANKLE FRACTURE;  Surgeon: Genelle Standing, MD;  Location: [facility];  Service: Orthopedics;  Laterality: Left;   ROTATOR CUFF REPAIR Right    2018   Patient Active Problem List   Diagnosis Date Noted   Pain from implanted hardware 12/19/2023   Status post open reduction with internal fixation (ORIF) of fracture of ankle- Left  10/06/2023   Arthritis of midtarsal joint of left foot 10/06/2023   Exudative age-related macular degeneration, right eye, with active choroidal neovascularization (HCC) 05/24/2022   Decreased grip strength of right hand 09/16/2021   Gait disturbance 07/12/2021   Mild cognitive impairment 07/12/2021   Thyroid  goiter 12/10/2020   Mixed hyperlipidemia 12/10/2020   Degenerative cervical spinal stenosis, mild C4-C5 and C5-C6 12/10/2020   Lumbar degenerative disc disease, L4-L5 with left lateral recess stenosis 12/10/2020   Personal history of fall 09/01/2020   Congenital cavus deformity of both feet 03/02/2020   Metatarsalgia of both feet 03/02/2020   Hand arthritis 03/02/2020   Arthritis of carpometacarpal [facility]) joint of both thumbs 09/19/2019   Overactive bladder 09/19/2019   Sciatica 04/26/2019   Paresthesia of both feet 02/04/2019   Epidermal cyst 04/17/2018   Allergic rhinitis 06/23/2015   Eczema 06/23/2015   Gastro-esophageal reflux disease without esophagitis 06/23/2015   Essential hypertension 06/23/2015   Hypothyroidism 06/23/2015    PCP: Thedora Senior MD   REFERRING PROVIDER: Genelle Standing, MD  REFERRING DIAG:  Diagnosis  [phone] (ICD-10-CM) - Bimalleolar ankle fracture, left, closed, initial encounter    THERAPY DIAG:  Pain in left ankle and joints of left foot - Plan: PT plan of care cert/re-cert  Difficulty in walking, not  elsewhere classified - Plan: PT plan of care cert/re-cert  Stiffness of left ankle, not elsewhere classified - Plan: PT plan of care cert/re-cert  Unsteadiness on feet - Plan: PT plan of care cert/re-cert  Localized edema - Plan: PT plan of care cert/re-cert  Rationale for Evaluation and Treatment: Rehabilitation  ONSET DATE: L ankle ORIF 06/2023, hardware removal 12/19/23  SUBJECTIVE:   SUBJECTIVE STATEMENT:   Feeling better, I still have that hard spot on the bottom of my arch. Saw one podiatrist but didn't like the office, going  to see another one when able. Would put myself at about 65/100 with biggest concern being ankle stability/ankle not giving out on me.   EVAL:I was feeling pretty good after I finished PT, after that the right ankle I still had a lot of swelling and issues getting into shoes. It stayed tender and gave me some shooting pains down into the foot. Dr. Genelle said that it might help to take out the hardware. I have been careful with it, I know I can put weight on it but being careful when it hurts.  PERTINENT HISTORY:  See above in addition to the following from MD note:   6 months status post left ankle open reduction internal fixation. She is somewhat symptomatic about her medial malleolar screws. She is also having symptoms about her peroneal tendon insertions at the plantar aspect of the foot as well as issues with neuropathy which she is having on the other side as well. Given this I do believe she would benefit from a custom orthotic. I will plan to see her back as needed should she want to consider medial malleolar screw removal PAIN:  Are you having pain? No 0/10 just arch discomfort   PRECAUTIONS: None  RED FLAGS: None   WEIGHT BEARING RESTRICTIONS:  weightbearing as tolerated per op note   FALLS:  Has patient fallen in last 6 months? No  LIVING ENVIRONMENT: Lives with: lives with their family Lives in: House/apartment Stairs: 4STE with rails, has second floor (steps with split landing) Has following equipment at home: Vannie - 2 wheeled and Wheelchair (manual)  OCCUPATION: retired- Microbiologist and flight attendant   PLOF: Independent, Independent with basic ADLs, Independent with gait, and Independent with transfers  PATIENT GOALS: be able to get around without sharp pains   NEXT MD VISIT: Dr. Genelle 01/03/24  OBJECTIVE:  Note: Objective measures were completed at Evaluation unless otherwise noted.    PATIENT SURVEYS:   THE PATIENT SPECIFIC FUNCTIONAL  SCALE  Place score of 0-10 (0 = unable to perform activity and 10 = able to perform activity at the same level as before injury or problem)  Activity Date: Eval 12/26/23 02/15/24   Grocery shopping  0 5   2. Cleaning house  5 5   3. Cooking  2 8   4.      Total Score 2.3 6     Total Score = Sum of activity scores/number of activities  Minimally Detectable Change: 3 points (for single activity); 2 points (for average score)  [name] (nd). The Patient Specific Functional Scale . Retrieved from [email or web address]   COGNITION: Overall cognitive status: hx of STM impairment      SENSATION: Not tested  EDEMA:   Appropriate for post-op status, good capillary refill       LOWER EXTREMITY ROM:  Active ROM Left eval Left 01/02/24 Left 01/10/24 Left  02/02/24 Left 02/08/24  Hip flexion  Hip extension       Hip abduction       Hip adduction       Hip internal rotation       Hip external rotation       Knee flexion       Knee extension       Ankle dorsiflexion -25* -10* 0 5 9  Ankle plantarflexion 58* 61* 45 45 45  Ankle inversion 21* 26* 29 30 34  Ankle eversion 5* 7* 19 26 23    (Blank rows = not tested)  LOWER EXTREMITY MMT:  MMT Left eval Left 02/02/24 Left 02/15/24  Hip flexion     Hip extension     Hip abduction     Hip adduction     Hip internal rotation     Hip external rotation     Knee flexion     Knee extension     Ankle dorsiflexion 3- 4 4+  Ankle plantarflexion 3- 4 3- from standing heel lift test   Ankle inversion  4 4+ arch pain  Ankle eversion   4 arch pain    (Blank rows = not tested)   GAIT: Distance walked: deferred today due to pain/pulling at surgical site   Able to transfer and wt shift well with RW      02/15/24 0001  Standardized Balance Assessment  Standardized Balance Assessment Dynamic Gait Index  Dynamic Gait Index  Level Surface 3  Change in Gait Speed 3   Gait with Horizontal Head Turns 2  Gait with Vertical Head Turns 2  Gait and Pivot Turn 2  Step Over Obstacle 2  Step Around Obstacles 2  Steps 2  Total Score 18                                                                                                                                  TREATMENT DATE:   02/15/24  Scifit L4x8 minutes for w/u  Rocker board AP x20 standing Rocker board inversion/eversion x20 standing   PSFS, MMT, DGI and goal updates    Tandem stance blue foam pad 3x30 seconds B  Tandem walks solid surface x3 full laps // bars Step overs blue foam pad x10 B 3 way taps off blue foam pad 2x5 B        PATIENT EDUCATION:  Education details: exam findings, POC, HEP, encouraged WBAT, ice, elevation  Person educated: Patient Education method: Explanation, Demonstration, and Handouts Education comprehension: verbalized understanding, returned demonstration, and needs further education  HOME EXERCISE PROGRAM:  Access Code: [email or web address] URL: [email or web address] Date: 01/02/2024 Prepared by: Josette Rough  Exercises - Supine Ankle Pumps in Elevation on Pillows  - 2-3 x daily - 7 x weekly - 1 sets - 10 reps - Ankle Circles in Elevation  - 2-3 x daily - 7 x weekly - 1 sets - 10 reps - Ankle Alphabet in Elevation  - 2-3 x  daily - 7 x weekly - 1 sets - 10 reps - Lateral Weight Shift with Parallel Bars (BKA)  - 1 x daily - 7 x weekly - 1 sets - 10 reps - 2 seconds  hold - Supine Active Straight Leg Raise  - 1 x daily - 7 x weekly - 2 sets - 10 reps - Sidelying Hip Abduction  - 1 x daily - 7 x weekly - 2 sets - 10 reps - Seated Long Arc Quad  - 1 x daily - 7 x weekly - 2 sets - 10 reps  ASSESSMENT:  CLINICAL IMPRESSION:  Arrives today doing well, looks to have made a lot of progress since last time this therapist worked  with her. Her biggest concern at this time is her ankle being stable and not giving when she is up on it. Will continue to  challenge her, still having ongoing issues with ankle strength and stability with dynamic tasks as objectively confirmed by MMT measures and functional performance today.   OBJECTIVE IMPAIRMENTS: Abnormal gait, decreased activity tolerance, decreased balance, decreased knowledge of use of DME, decreased mobility, difficulty walking, decreased ROM, decreased strength, and pain.   ACTIVITY LIMITATIONS: standing, stairs, transfers, and locomotion level  PARTICIPATION LIMITATIONS: meal prep, cleaning, laundry, driving, shopping, and community activity  PERSONAL FACTORS: Behavior pattern, Education, Fitness, Social background, and Time since onset of injury/illness/exacerbation are also affecting patient's functional outcome.   REHAB POTENTIAL: Good  CLINICAL DECISION MAKING: Stable/uncomplicated  EVALUATION COMPLEXITY: Low   GOALS: Goals reviewed with patient? No  SHORT TERM GOALS: Target date: 03/07/2024     Will be compliant with appropriate progressive HEP  Baseline: Goal status: Met 01/10/24  2.  L ankle AROM to improve to: -10* DF, PF to 65*, eversion to 12* Baseline:  Goal status: ONGOING 01/02/24 see above, Progressing 01/10/24, Progressing 02/08/24  3.  Will be independent with appropriate edema management techniques  Baseline:  Goal status: Met 01/10/24  4.  Will be consistently ambulatory for household distances with LRAD  Baseline:  Goal status: Progressing 01/10/24, Partly met slight limp 02/08/24    LONG TERM GOALS: 03/28/2024        MMT to be at least 4/5 L ankle  Baseline:  Goal status: ONGOING 02/15/24 see chart   2.  L ankle AROM to be as follows: DF 10* Baseline:  Goal status: Progressing 02/02/24, progressing 02/08/24  3.  Will score at least 20/24 on DGI  Baseline:  Goal status: ONGOING 02/15/24 see chart   4.  Will be able to ambulate community distances with LRAD, L ankle pain no more than 3/10 Baseline:  Goal status: ONGOING 02/15/24 hasn't tried community  ambulation (grocery store only now) without cart   5.  PSFS to have improved by 3 points  Baseline:  Goal status: MET 02/15/24     PLAN:  PT FREQUENCY: 2x/week  PT DURATION: 6 weeks  PLANNED INTERVENTIONS: 97750- Physical Performance Testing, 97110-Therapeutic exercises, 97530- Therapeutic activity, 97112- Neuromuscular re-education, 97535- Self Care, 02859- Manual therapy, 97116- Gait training, [phone]- Vasopneumatic device, and Cryotherapy  PLAN FOR NEXT SESSION: focus on higher level balance and functional strength/ankle stability, needs HEP update and work on transition to independent program by end of cert period  (late September)   Josette Rough, PT, DPT 02/15/24 9:30 AM

## 2024-02-19 DIAGNOSIS — H353211 Exudative age-related macular degeneration, right eye, with active choroidal neovascularization: Secondary | ICD-10-CM | POA: Diagnosis not present

## 2024-02-20 ENCOUNTER — Ambulatory Visit: Admitting: Physical Therapy

## 2024-02-20 ENCOUNTER — Encounter: Payer: Self-pay | Admitting: Physical Therapy

## 2024-02-20 DIAGNOSIS — R2681 Unsteadiness on feet: Secondary | ICD-10-CM | POA: Diagnosis not present

## 2024-02-20 DIAGNOSIS — M25572 Pain in left ankle and joints of left foot: Secondary | ICD-10-CM

## 2024-02-20 DIAGNOSIS — R262 Difficulty in walking, not elsewhere classified: Secondary | ICD-10-CM | POA: Diagnosis not present

## 2024-02-20 DIAGNOSIS — M25672 Stiffness of left ankle, not elsewhere classified: Secondary | ICD-10-CM | POA: Diagnosis not present

## 2024-02-20 DIAGNOSIS — R600 Localized edema: Secondary | ICD-10-CM | POA: Diagnosis not present

## 2024-02-20 NOTE — Therapy (Signed)
 OUTPATIENT PHYSICAL THERAPY LOWER EXTREMITY TREATMENT/RECERT   Patient Name: Shelly Pearson MRN: [id number] DOB:21-Jan-1941, 83 y.o., female Today's Date: 02/20/2024  END OF SESSION:  PT End of Session - 02/20/24 1427     Visit Number 14    Date for PT Re-Evaluation 03/28/24    PT Start Time 1430    PT Stop Time 1515    PT Time Calculation (min) 45 min    Activity Tolerance Patient tolerated treatment well    Behavior During Therapy [facility] for tasks assessed/performed             Past Medical History:  Diagnosis Date   Allergy 60 or so years ago   Pollen, bandaids, pollen   Arthritis 20 or more years ago   Thumbs, feet, shoulder, back, etc.   B12 deficiency 03/02/2020   GERD (gastroesophageal reflux disease)    Hyperlipidemia Been on atorvastatin  for ? years   Hypertension    Seasonal allergies    Thyroid  disease    Urge incontinence of urine 12/10/2020   Urgency incontinence    Past Surgical History:  Procedure Laterality Date   ABDOMINAL HYSTERECTOMY     APPENDECTOMY  12/18/71   When daughter was born by c-section   CESAREAN SECTION     CYSTOCELE REPAIR     FRACTURE SURGERY     HARDWARE REMOVAL Left 12/19/2023   Procedure: REMOVAL, HARDWARE;  Surgeon: Genelle Standing, MD;  Location: [facility];  Service: Orthopedics;  Laterality: Left;  LEFT  ANKLE REMOVAL OF HARDWARE   ORIF ANKLE FRACTURE Left 06/08/2023   Procedure: LEFT OPEN REDUCTION INTERNAL FIXATION (ORIF) ANKLE FRACTURE;  Surgeon: Genelle Standing, MD;  Location: [facility];  Service: Orthopedics;  Laterality: Left;   ROTATOR CUFF REPAIR Right    2018   Patient Active Problem List   Diagnosis Date Noted   Pain from implanted hardware 12/19/2023   Status post open reduction with internal fixation (ORIF) of fracture of ankle- Left 10/06/2023   Arthritis of midtarsal joint of left foot 10/06/2023   Exudative age-related macular degeneration, right eye, with active choroidal neovascularization  (HCC) 05/24/2022   Decreased grip strength of right hand 09/16/2021   Gait disturbance 07/12/2021   Mild cognitive impairment 07/12/2021   Thyroid  goiter 12/10/2020   Mixed hyperlipidemia 12/10/2020   Degenerative cervical spinal stenosis, mild C4-C5 and C5-C6 12/10/2020   Lumbar degenerative disc disease, L4-L5 with left lateral recess stenosis 12/10/2020   Personal history of fall 09/01/2020   Congenital cavus deformity of both feet 03/02/2020   Metatarsalgia of both feet 03/02/2020   Hand arthritis 03/02/2020   Arthritis of carpometacarpal ([facility]) joint of both thumbs 09/19/2019   Overactive bladder 09/19/2019   Sciatica 04/26/2019   Paresthesia of both feet 02/04/2019   Epidermal cyst 04/17/2018   Allergic rhinitis 06/23/2015   Eczema 06/23/2015   Gastro-esophageal reflux disease without esophagitis 06/23/2015   Essential hypertension 06/23/2015   Hypothyroidism 06/23/2015    PCP: Thedora Senior MD   REFERRING PROVIDER: Genelle Standing, MD  REFERRING DIAG:  Diagnosis  [phone] (ICD-10-CM) - Bimalleolar ankle fracture, left, closed, initial encounter    THERAPY DIAG:  Pain in left ankle and joints of left foot  Difficulty in walking, not elsewhere classified  Stiffness of left ankle, not elsewhere classified  Unsteadiness on feet  Localized edema  Rationale for Evaluation and Treatment: Rehabilitation  ONSET DATE: L ankle ORIF 06/2023, hardware removal 12/19/23  SUBJECTIVE:   SUBJECTIVE STATEMENT:   The  arch is my biggest issue right now  EVAL:I was feeling pretty good after I finished PT, after that the right ankle I still had a lot of swelling and issues getting into shoes. It stayed tender and gave me some shooting pains down into the foot. Dr. Genelle said that it might help to take out the hardware. I have been careful with it, I know I can put weight on it but being careful when it hurts.  PERTINENT HISTORY:  See above in addition to the following from  MD note:   6 months status post left ankle open reduction internal fixation. She is somewhat symptomatic about her medial malleolar screws. She is also having symptoms about her peroneal tendon insertions at the plantar aspect of the foot as well as issues with neuropathy which she is having on the other side as well. Given this I do believe she would benefit from a custom orthotic. I will plan to see her back as needed should she want to consider medial malleolar screw removal PAIN:  Are you having pain? 3-4/10 when she is on her arch   PRECAUTIONS: None  RED FLAGS: None   WEIGHT BEARING RESTRICTIONS:  weightbearing as tolerated per op note   FALLS:  Has patient fallen in last 6 months? No  LIVING ENVIRONMENT: Lives with: lives with their family Lives in: House/apartment Stairs: 4STE with rails, has second floor (steps with split landing) Has following equipment at home: Vannie - 2 wheeled and Wheelchair (manual)  OCCUPATION: retired- Microbiologist and flight attendant   PLOF: Independent, Independent with basic ADLs, Independent with gait, and Independent with transfers  PATIENT GOALS: be able to get around without sharp pains   NEXT MD VISIT: Dr. Genelle 01/03/24  OBJECTIVE:  Note: Objective measures were completed at Evaluation unless otherwise noted.    PATIENT SURVEYS:   THE PATIENT SPECIFIC FUNCTIONAL SCALE  Place score of 0-10 (0 = unable to perform activity and 10 = able to perform activity at the same level as before injury or problem)  Activity Date: Eval 12/26/23 02/15/24   Grocery shopping  0 5   2. Cleaning house  5 5   3. Cooking  2 8   4.      Total Score 2.3 6     Total Score = Sum of activity scores/number of activities  Minimally Detectable Change: 3 points (for single activity); 2 points (for average score)  [name] (nd). The Patient Specific Functional Scale . Retrieved from  [email or web address]   COGNITION: Overall cognitive status: hx of STM impairment      SENSATION: Not tested  EDEMA:   Appropriate for post-op status, good capillary refill       LOWER EXTREMITY ROM:  Active ROM Left eval Left 01/02/24 Left 01/10/24 Left  02/02/24 Left 02/08/24  Hip flexion       Hip extension       Hip abduction       Hip adduction       Hip internal rotation       Hip external rotation       Knee flexion       Knee extension       Ankle dorsiflexion -25* -10* 0 5 9  Ankle plantarflexion 58* 61* 45 45 45  Ankle inversion 21* 26* 29 30 34  Ankle eversion 5* 7* 19 26 23    (Blank rows = not tested)  LOWER EXTREMITY MMT:  MMT  Left eval Left 02/02/24 Left 02/15/24  Hip flexion     Hip extension     Hip abduction     Hip adduction     Hip internal rotation     Hip external rotation     Knee flexion     Knee extension     Ankle dorsiflexion 3- 4 4+  Ankle plantarflexion 3- 4 3- from standing heel lift test   Ankle inversion  4 4+ arch pain  Ankle eversion   4 arch pain    (Blank rows = not tested)   GAIT: Distance walked: deferred today due to pain/pulling at surgical site   Able to transfer and wt shift well with RW      02/15/24 0001  Standardized Balance Assessment  Standardized Balance Assessment Dynamic Gait Index  Dynamic Gait Index  Level Surface 3  Change in Gait Speed 3  Gait with Horizontal Head Turns 2  Gait with Vertical Head Turns 2  Gait and Pivot Turn 2  Step Over Obstacle 2  Step Around Obstacles 2  Steps 2  Total Score 18                                                                                                                                  TREATMENT DATE:  02/20/24 Bike L3 x 6 min Gait to the back building 2 flights of stairs, alt pattern, with and without 1 rail Gait around two islands un the back parking lot on the L side Sit to stand on airex w/ yellow  ball chest press 2x10  Side step in upside down 6in box x5 each  Upside down box on airex HS curls 20lb 2x12 Leg Ext 10lb 2x12  02/15/24  Scifit L4x8 minutes for w/u  Rocker board AP x20 standing Rocker board inversion/eversion x20 standing   PSFS, MMT, DGI and goal updates    Tandem stance blue foam pad 3x30 seconds B  Tandem walks solid surface x3 full laps // bars Step overs blue foam pad x10 B 3 way taps off blue foam pad 2x5 B        PATIENT EDUCATION:  Education details: exam findings, POC, HEP, encouraged WBAT, ice, elevation  Person educated: Patient Education method: Explanation, Demonstration, and Handouts Education comprehension: verbalized understanding, returned demonstration, and needs further education  HOME EXERCISE PROGRAM:  Access Code: [email or web address] URL: [email or web address] Date: 01/02/2024 Prepared by: Josette Rough  Exercises - Supine Ankle Pumps in Elevation on Pillows  - 2-3 x daily - 7 x weekly - 1 sets - 10 reps - Ankle Circles in Elevation  - 2-3 x daily - 7 x weekly - 1 sets - 10 reps - Ankle Alphabet in Elevation  - 2-3 x daily - 7 x weekly - 1 sets - 10 reps - Lateral Weight Shift with Parallel Bars (BKA)  - 1 x daily - 7 x weekly - 1 sets -  10 reps - 2 seconds  hold - Supine Active Straight Leg Raise  - 1 x daily - 7 x weekly - 2 sets - 10 reps - Sidelying Hip Abduction  - 1 x daily - 7 x weekly - 2 sets - 10 reps - Seated Long Arc Quad  - 1 x daily - 7 x weekly - 2 sets - 10 reps  ASSESSMENT:  CLINICAL IMPRESSION:  Arrives today doing well. Her biggest concern at this time is her ankle stability and arch pain. Progressed to more functional activities today without issue. Will continue to challenge her with functional interventions to help gain more confidence with her mobility. Cue needed to maintain alt pattern with stairs. Good effort during session.  OBJECTIVE IMPAIRMENTS: Abnormal gait, decreased activity tolerance,  decreased balance, decreased knowledge of use of DME, decreased mobility, difficulty walking, decreased ROM, decreased strength, and pain.   ACTIVITY LIMITATIONS: standing, stairs, transfers, and locomotion level  PARTICIPATION LIMITATIONS: meal prep, cleaning, laundry, driving, shopping, and community activity  PERSONAL FACTORS: Behavior pattern, Education, Fitness, Social background, and Time since onset of injury/illness/exacerbation are also affecting patient's functional outcome.   REHAB POTENTIAL: Good  CLINICAL DECISION MAKING: Stable/uncomplicated  EVALUATION COMPLEXITY: Low   GOALS: Goals reviewed with patient? No  SHORT TERM GOALS: Target date: 03/07/2024     Will be compliant with appropriate progressive HEP  Baseline: Goal status: Met 01/10/24  2.  L ankle AROM to improve to: -10* DF, PF to 65*, eversion to 12* Baseline:  Goal status: ONGOING 01/02/24 see above, Progressing 01/10/24, Progressing 02/08/24  3.  Will be independent with appropriate edema management techniques  Baseline:  Goal status: Met 01/10/24  4.  Will be consistently ambulatory for household distances with LRAD  Baseline:  Goal status: Progressing 01/10/24, Partly met slight limp 02/08/24    LONG TERM GOALS: 03/28/2024        MMT to be at least 4/5 L ankle  Baseline:  Goal status: ONGOING 02/15/24 see chart   2.  L ankle AROM to be as follows: DF 10* Baseline:  Goal status: Progressing 02/02/24, progressing 02/08/24  3.  Will score at least 20/24 on DGI  Baseline:  Goal status: ONGOING 02/15/24 see chart   4.  Will be able to ambulate community distances with LRAD, L ankle pain no more than 3/10 Baseline:  Goal status: ONGOING 02/15/24 hasn't tried community ambulation (grocery store only now) without cart   5.  PSFS to have improved by 3 points  Baseline:  Goal status: MET 02/15/24     PLAN:  PT FREQUENCY: 2x/week  PT DURATION: 6 weeks  PLANNED INTERVENTIONS: 97750- Physical  Performance Testing, 97110-Therapeutic exercises, 97530- Therapeutic activity, 97112- Neuromuscular re-education, 97535- Self Care, 02859- Manual therapy, 97116- Gait training, [phone]- Vasopneumatic device, and Cryotherapy  PLAN FOR NEXT SESSION: focus on higher level balance and functional strength/ankle stability, needs HEP update and work on transition to independent program by end of cert period  (late September)   Tanda Sorrow, PTA 02/20/24 2:27 PM

## 2024-02-22 ENCOUNTER — Encounter: Payer: Self-pay | Admitting: Physical Therapy

## 2024-02-22 ENCOUNTER — Ambulatory Visit: Admitting: Physical Therapy

## 2024-02-22 DIAGNOSIS — R600 Localized edema: Secondary | ICD-10-CM

## 2024-02-22 DIAGNOSIS — R262 Difficulty in walking, not elsewhere classified: Secondary | ICD-10-CM

## 2024-02-22 DIAGNOSIS — M25572 Pain in left ankle and joints of left foot: Secondary | ICD-10-CM | POA: Diagnosis not present

## 2024-02-22 DIAGNOSIS — R2681 Unsteadiness on feet: Secondary | ICD-10-CM

## 2024-02-22 DIAGNOSIS — M25672 Stiffness of left ankle, not elsewhere classified: Secondary | ICD-10-CM | POA: Diagnosis not present

## 2024-02-22 NOTE — Therapy (Signed)
 OUTPATIENT PHYSICAL THERAPY LOWER EXTREMITY TREATMENT/RECERT   Patient Name: Shelly Pearson MRN: [id number] DOB:[birth year], 83 y.o., female Today's Date: 02/22/2024  END OF SESSION:  PT End of Session - 02/22/24 1141     Visit Number 15    Date for PT Re-Evaluation 03/28/24    PT Start Time 1142    PT Stop Time 1228    PT Time Calculation (min) 46 min    Activity Tolerance Patient tolerated treatment well    Behavior During Therapy [facility] for tasks assessed/performed             Past Medical History:  Diagnosis Date   Allergy 60 or so years ago   Pollen, bandaids, pollen   Arthritis 20 or more years ago   Thumbs, feet, shoulder, back, etc.   B12 deficiency 03/02/2020   GERD (gastroesophageal reflux disease)    Hyperlipidemia Been on atorvastatin  for ? years   Hypertension    Seasonal allergies    Thyroid  disease    Urge incontinence of urine 12/10/2020   Urgency incontinence    Past Surgical History:  Procedure Laterality Date   ABDOMINAL HYSTERECTOMY     APPENDECTOMY  12/18/71   When daughter was born by c-section   CESAREAN SECTION     CYSTOCELE REPAIR     FRACTURE SURGERY     HARDWARE REMOVAL Left 12/19/2023   Procedure: REMOVAL, HARDWARE;  Surgeon: Genelle Standing, MD;  Location: [facility];  Service: Orthopedics;  Laterality: Left;  LEFT  ANKLE REMOVAL OF HARDWARE   ORIF ANKLE FRACTURE Left 06/08/2023   Procedure: LEFT OPEN REDUCTION INTERNAL FIXATION (ORIF) ANKLE FRACTURE;  Surgeon: Genelle Standing, MD;  Location: [facility];  Service: Orthopedics;  Laterality: Left;   ROTATOR CUFF REPAIR Right    2018   Patient Active Problem List   Diagnosis Date Noted   Pain from implanted hardware 12/19/2023   Status post open reduction with internal fixation (ORIF) of fracture of ankle- Left 10/06/2023   Arthritis of midtarsal joint of left foot 10/06/2023   Exudative age-related macular degeneration, right eye, with active choroidal neovascularization  (HCC) 05/24/2022   Decreased grip strength of right hand 09/16/2021   Gait disturbance 07/12/2021   Mild cognitive impairment 07/12/2021   Thyroid  goiter 12/10/2020   Mixed hyperlipidemia 12/10/2020   Degenerative cervical spinal stenosis, mild C4-C5 and C5-C6 12/10/2020   Lumbar degenerative disc disease, L4-L5 with left lateral recess stenosis 12/10/2020   Personal history of fall 09/01/2020   Congenital cavus deformity of both feet 03/02/2020   Metatarsalgia of both feet 03/02/2020   Hand arthritis 03/02/2020   Arthritis of carpometacarpal (CMC) joint of both thumbs 09/19/2019   Overactive bladder 09/19/2019   Sciatica 04/26/2019   Paresthesia of both feet 02/04/2019   Epidermal cyst 04/17/2018   Allergic rhinitis 06/23/2015   Eczema 06/23/2015   Gastro-esophageal reflux disease without esophagitis 06/23/2015   Essential hypertension 06/23/2015   Hypothyroidism 06/23/2015    PCP: Thedora Senior MD   REFERRING PROVIDER: Genelle Standing, MD  REFERRING DIAG:  Diagnosis  [phone] (ICD-10-CM) - Bimalleolar ankle fracture, left, closed, initial encounter    THERAPY DIAG:  Pain in left ankle and joints of left foot  Difficulty in walking, not elsewhere classified  Stiffness of left ankle, not elsewhere classified  Unsteadiness on feet  Localized edema  Rationale for Evaluation and Treatment: Rehabilitation  ONSET DATE: L ankle ORIF 06/2023, hardware removal 12/19/23  SUBJECTIVE:   SUBJECTIVE STATEMENT:   Everything  is perfect Only getting pain in the arch  EVAL:I was feeling pretty good after I finished PT, after that the right ankle I still had a lot of swelling and issues getting into shoes. It stayed tender and gave me some shooting pains down into the foot. Dr. Genelle said that it might help to take out the hardware. I have been careful with it, I know I can put weight on it but being careful when it hurts.  PERTINENT HISTORY:  See above in addition to the  following from MD note:   6 months status post left ankle open reduction internal fixation. She is somewhat symptomatic about her medial malleolar screws. She is also having symptoms about her peroneal tendon insertions at the plantar aspect of the foot as well as issues with neuropathy which she is having on the other side as well. Given this I do believe she would benefit from a custom orthotic. I will plan to see her back as needed should she want to consider medial malleolar screw removal PAIN:  Are you having pain? 2/10 tingling is arch   PRECAUTIONS: None  RED FLAGS: None   WEIGHT BEARING RESTRICTIONS:  weightbearing as tolerated per op note   FALLS:  Has patient fallen in last 6 months? No  LIVING ENVIRONMENT: Lives with: lives with their family Lives in: House/apartment Stairs: 4STE with rails, has second floor (steps with split landing) Has following equipment at home: Vannie - 2 wheeled and Wheelchair (manual)  OCCUPATION: retired- Microbiologist and flight attendant   PLOF: Independent, Independent with basic ADLs, Independent with gait, and Independent with transfers  PATIENT GOALS: be able to get around without sharp pains   NEXT MD VISIT: Dr. Genelle 01/03/24  OBJECTIVE:  Note: Objective measures were completed at Evaluation unless otherwise noted.    PATIENT SURVEYS:   THE PATIENT SPECIFIC FUNCTIONAL SCALE  Place score of 0-10 (0 = unable to perform activity and 10 = able to perform activity at the same level as before injury or problem)  Activity Date: Eval 12/26/23 02/15/24   Grocery shopping  0 5   2. Cleaning house  5 5   3. Cooking  2 8   4.      Total Score 2.3 6     Total Score = Sum of activity scores/number of activities  Minimally Detectable Change: 3 points (for single activity); 2 points (for average score)  [name] (nd). The Patient Specific Functional Scale . Retrieved from  [email or web address]   COGNITION: Overall cognitive status: hx of STM impairment      SENSATION: Not tested  EDEMA:   Appropriate for post-op status, good capillary refill       LOWER EXTREMITY ROM:  Active ROM Left eval Left 01/02/24 Left 01/10/24 Left  02/02/24 Left 02/08/24 Left 02/22/24  Hip flexion        Hip extension        Hip abduction        Hip adduction        Hip internal rotation        Hip external rotation        Knee flexion        Knee extension        Ankle dorsiflexion -25* -10* 0 5 9 11   Ankle plantarflexion 58* 61* 45 45 45 58  Ankle inversion 21* 26* 29 30 34 35  Ankle eversion 5* 7* 19 26 23 25    (  Blank rows = not tested)  LOWER EXTREMITY MMT:  MMT Left eval Left 02/02/24 Left 02/15/24  Hip flexion     Hip extension     Hip abduction     Hip adduction     Hip internal rotation     Hip external rotation     Knee flexion     Knee extension     Ankle dorsiflexion 3- 4 4+  Ankle plantarflexion 3- 4 3- from standing heel lift test   Ankle inversion  4 4+ arch pain  Ankle eversion   4 arch pain    (Blank rows = not tested)   GAIT: Distance walked: deferred today due to pain/pulling at surgical site   Able to transfer and wt shift well with RW      02/15/24 0001  Standardized Balance Assessment  Standardized Balance Assessment Dynamic Gait Index  Dynamic Gait Index  Level Surface 3  Change in Gait Speed 3  Gait with Horizontal Head Turns 2  Gait with Vertical Head Turns 2  Gait and Pivot Turn 2  Step Over Obstacle 2  Step Around Obstacles 2  Steps 2  Total Score 18                                                                                                                                  TREATMENT DATE:  02/22/24 Bike L3 x 6 min Gait around widest part of back building in parking lot 6in box on airex forward and lateral step ups x10  30lb resisted gait 4 way x 3 each  4 way  ankle blue 2x10  SLS LLE 3x5''  02/20/24 Bike L3 x 6 min Gait to the back building 2 flights of stairs, alt pattern, with and without 1 rail Gait around two islands un the back parking lot on the L side Sit to stand on airex w/ yellow ball chest press 2x10  Side step in upside down 6in box x5 each  Upside down box on airex HS curls 20lb 2x12 Leg Ext 10lb 2x12  02/15/24  Scifit L4x8 minutes for w/u  Rocker board AP x20 standing Rocker board inversion/eversion x20 standing   PSFS, MMT, DGI and goal updates    Tandem stance blue foam pad 3x30 seconds B  Tandem walks solid surface x3 full laps // bars Step overs blue foam pad x10 B 3 way taps off blue foam pad 2x5 B        PATIENT EDUCATION:  Education details: exam findings, POC, HEP, encouraged WBAT, ice, elevation  Person educated: Patient Education method: Explanation, Demonstration, and Handouts Education comprehension: verbalized understanding, returned demonstration, and needs further education  HOME EXERCISE PROGRAM:  Access Code: [email or web address] URL: [email or web address] Date: 01/02/2024 Prepared by: Josette Rough  Exercises - Supine Ankle Pumps in Elevation on Pillows  - 2-3 x daily - 7 x weekly - 1 sets - 10 reps - Ankle Circles in  Elevation  - 2-3 x daily - 7 x weekly - 1 sets - 10 reps - Ankle Alphabet in Elevation  - 2-3 x daily - 7 x weekly - 1 sets - 10 reps - Lateral Weight Shift with Parallel Bars (BKA)  - 1 x daily - 7 x weekly - 1 sets - 10 reps - 2 seconds  hold - Supine Active Straight Leg Raise  - 1 x daily - 7 x weekly - 2 sets - 10 reps - Sidelying Hip Abduction  - 1 x daily - 7 x weekly - 2 sets - 10 reps - Seated Long Arc Quad  - 1 x daily - 7 x weekly - 2 sets - 10 reps  ASSESSMENT:  CLINICAL IMPRESSION:  Arrives today doing well. Her biggest concern at this is arch pain. Continues with a progressed to more functional activities today without issue. She has improved increasing her  L ankle AROM. Increase resistance tolerated with resisted gait.  Will continue to challenge her with functional interventions to help gain more confidence with her mobility. Good effort during session.  OBJECTIVE IMPAIRMENTS: Abnormal gait, decreased activity tolerance, decreased balance, decreased knowledge of use of DME, decreased mobility, difficulty walking, decreased ROM, decreased strength, and pain.   ACTIVITY LIMITATIONS: standing, stairs, transfers, and locomotion level  PARTICIPATION LIMITATIONS: meal prep, cleaning, laundry, driving, shopping, and community activity  PERSONAL FACTORS: Behavior pattern, Education, Fitness, Social background, and Time since onset of injury/illness/exacerbation are also affecting patient's functional outcome.   REHAB POTENTIAL: Good  CLINICAL DECISION MAKING: Stable/uncomplicated  EVALUATION COMPLEXITY: Low   GOALS: Goals reviewed with patient? No  SHORT TERM GOALS: Target date: 03/07/2024     Will be compliant with appropriate progressive HEP  Baseline: Goal status: Met 01/10/24  2.  L ankle AROM to improve to: -10* DF, PF to 65*, eversion to 12* Baseline:  Goal status: ONGOING 01/02/24 see above, Progressing 01/10/24, Progressing 02/08/24, Partly Met 02/22/24  3.  Will be independent with appropriate edema management techniques  Baseline:  Goal status: Met 01/10/24  4.  Will be consistently ambulatory for household distances with LRAD  Baseline:  Goal status: Progressing 01/10/24, Partly met slight limp 02/22/24    LONG TERM GOALS: 03/28/2024        MMT to be at least 4/5 L ankle  Baseline:  Goal status: ONGOING 02/15/24 see chart   2.  L ankle AROM to be as follows: DF 10* Baseline:  Goal status: Progressing 02/02/24, progressing 02/08/24, Met 02/22/24  3.  Will score at least 20/24 on DGI  Baseline:  Goal status: ONGOING 02/15/24 see chart   4.  Will be able to ambulate community distances with LRAD, L ankle pain no more than  3/10 Baseline:  Goal status: ONGOING 02/15/24 hasn't tried community ambulation (grocery store only now) without cart  5.  PSFS to have improved by 3 points  Baseline:  Goal status: MET 02/15/24     PLAN:  PT FREQUENCY: 2x/week  PT DURATION: 6 weeks  PLANNED INTERVENTIONS: 97750- Physical Performance Testing, 97110-Therapeutic exercises, 97530- Therapeutic activity, 97112- Neuromuscular re-education, 97535- Self Care, [id number]- Manual therapy, [id number]- Gait training, [phone]- Vasopneumatic device, and Cryotherapy  PLAN FOR NEXT SESSION: focus on higher level balance and functional strength/ankle stability, needs HEP update and work on transition to independent program by end of cert period  (late September)   Tanda Sorrow, PTA 02/22/24 11:42 AM

## 2024-02-26 ENCOUNTER — Ambulatory Visit: Admitting: Physical Therapy

## 2024-03-06 ENCOUNTER — Encounter: Payer: Self-pay | Admitting: Physical Therapy

## 2024-03-06 ENCOUNTER — Ambulatory Visit: Attending: Orthopaedic Surgery | Admitting: Physical Therapy

## 2024-03-06 DIAGNOSIS — M25672 Stiffness of left ankle, not elsewhere classified: Secondary | ICD-10-CM | POA: Insufficient documentation

## 2024-03-06 DIAGNOSIS — R2681 Unsteadiness on feet: Secondary | ICD-10-CM | POA: Diagnosis not present

## 2024-03-06 DIAGNOSIS — R600 Localized edema: Secondary | ICD-10-CM | POA: Diagnosis not present

## 2024-03-06 DIAGNOSIS — R262 Difficulty in walking, not elsewhere classified: Secondary | ICD-10-CM | POA: Insufficient documentation

## 2024-03-06 DIAGNOSIS — M25572 Pain in left ankle and joints of left foot: Secondary | ICD-10-CM | POA: Insufficient documentation

## 2024-03-06 NOTE — Therapy (Signed)
 OUTPATIENT PHYSICAL THERAPY LOWER EXTREMITY TREATMENT/RECERT   Patient Name: Shelly Pearson MRN: [id number] DOB:1940/08/02, 83 y.o., female Today's Date: 03/06/2024  END OF SESSION:  PT End of Session - 03/06/24 1057     Visit Number 16    Date for PT Re-Evaluation 03/28/24    PT Start Time 1100    PT Stop Time 1145    PT Time Calculation (min) 45 min    Activity Tolerance Patient tolerated treatment well    Behavior During Therapy [facility] for tasks assessed/performed             Past Medical History:  Diagnosis Date   Allergy 60 or so years ago   Pollen, bandaids, pollen   Arthritis 20 or more years ago   Thumbs, feet, shoulder, back, etc.   B12 deficiency 03/02/2020   GERD (gastroesophageal reflux disease)    Hyperlipidemia Been on atorvastatin  for ? years   Hypertension    Seasonal allergies    Thyroid  disease    Urge incontinence of urine 12/10/2020   Urgency incontinence    Past Surgical History:  Procedure Laterality Date   ABDOMINAL HYSTERECTOMY     APPENDECTOMY  12/18/71   When daughter was born by c-section   CESAREAN SECTION     CYSTOCELE REPAIR     FRACTURE SURGERY     HARDWARE REMOVAL Left 12/19/2023   Procedure: REMOVAL, HARDWARE;  Surgeon: Genelle Standing, MD;  Location: [facility];  Service: Orthopedics;  Laterality: Left;  LEFT  ANKLE REMOVAL OF HARDWARE   ORIF ANKLE FRACTURE Left 06/08/2023   Procedure: LEFT OPEN REDUCTION INTERNAL FIXATION (ORIF) ANKLE FRACTURE;  Surgeon: Genelle Standing, MD;  Location: [facility];  Service: Orthopedics;  Laterality: Left;   ROTATOR CUFF REPAIR Right    2018   Patient Active Problem List   Diagnosis Date Noted   Pain from implanted hardware 12/19/2023   Status post open reduction with internal fixation (ORIF) of fracture of ankle- Left 10/06/2023   Arthritis of midtarsal joint of left foot 10/06/2023   Exudative age-related macular degeneration, right eye, with active choroidal neovascularization  (HCC) 05/24/2022   Decreased grip strength of right hand 09/16/2021   Gait disturbance 07/12/2021   Mild cognitive impairment 07/12/2021   Thyroid  goiter 12/10/2020   Mixed hyperlipidemia 12/10/2020   Degenerative cervical spinal stenosis, mild C4-C5 and C5-C6 12/10/2020   Lumbar degenerative disc disease, L4-L5 with left lateral recess stenosis 12/10/2020   Personal history of fall 09/01/2020   Congenital cavus deformity of both feet 03/02/2020   Metatarsalgia of both feet 03/02/2020   Hand arthritis 03/02/2020   Arthritis of carpometacarpal (CMC) joint of both thumbs 09/19/2019   Overactive bladder 09/19/2019   Sciatica 04/26/2019   Paresthesia of both feet 02/04/2019   Epidermal cyst 04/17/2018   Allergic rhinitis 06/23/2015   Eczema 06/23/2015   Gastro-esophageal reflux disease without esophagitis 06/23/2015   Essential hypertension 06/23/2015   Hypothyroidism 06/23/2015    PCP: Thedora Senior MD   REFERRING PROVIDER: Genelle Standing, MD  REFERRING DIAG:  Diagnosis  [phone] (ICD-10-CM) - Bimalleolar ankle fracture, left, closed, initial encounter    THERAPY DIAG:  Pain in left ankle and joints of left foot  Difficulty in walking, not elsewhere classified  Stiffness of left ankle, not elsewhere classified  Unsteadiness on feet  Localized edema  Rationale for Evaluation and Treatment: Rehabilitation  ONSET DATE: L ankle ORIF 06/2023, hardware removal 12/19/23  SUBJECTIVE:   SUBJECTIVE STATEMENT:   Ok,  ok The arch is still my problem Waiting to get a second opinion   EVAL:I was feeling pretty good after I finished PT, after that the right ankle I still had a lot of swelling and issues getting into shoes. It stayed tender and gave me some shooting pains down into the foot. Dr. Genelle said that it might help to take out the hardware. I have been careful with it, I know I can put weight on it but being careful when it hurts.  PERTINENT HISTORY:  See above  in addition to the following from MD note:   6 months status post left ankle open reduction internal fixation. She is somewhat symptomatic about her medial malleolar screws. She is also having symptoms about her peroneal tendon insertions at the plantar aspect of the foot as well as issues with neuropathy which she is having on the other side as well. Given this I do believe she would benefit from a custom orthotic. I will plan to see her back as needed should she want to consider medial malleolar screw removal PAIN:  Are you having pain? 0/10 tingling is arch   PRECAUTIONS: None  RED FLAGS: None   WEIGHT BEARING RESTRICTIONS:  weightbearing as tolerated per op note   FALLS:  Has patient fallen in last 6 months? No  LIVING ENVIRONMENT: Lives with: lives with their family Lives in: House/apartment Stairs: 4STE with rails, has second floor (steps with split landing) Has following equipment at home: Vannie - 2 wheeled and Wheelchair (manual)  OCCUPATION: retired- Microbiologist and flight attendant   PLOF: Independent, Independent with basic ADLs, Independent with gait, and Independent with transfers  PATIENT GOALS: be able to get around without sharp pains   NEXT MD VISIT: Dr. Genelle 01/03/24  OBJECTIVE:  Note: Objective measures were completed at Evaluation unless otherwise noted.    PATIENT SURVEYS:   THE PATIENT SPECIFIC FUNCTIONAL SCALE  Place score of 0-10 (0 = unable to perform activity and 10 = able to perform activity at the same level as before injury or problem)  Activity Date: Eval 12/26/23 02/15/24   Grocery shopping  0 5   2. Cleaning house  5 5   3. Cooking  2 8   4.      Total Score 2.3 6     Total Score = Sum of activity scores/number of activities  Minimally Detectable Change: 3 points (for single activity); 2 points (for average score)  [name] (nd). The Patient Specific Functional Scale . Retrieved from  [email or web address]   COGNITION: Overall cognitive status: hx of STM impairment      SENSATION: Not tested  EDEMA:   Appropriate for post-op status, good capillary refill       LOWER EXTREMITY ROM:  Active ROM Left eval Left 01/02/24 Left 01/10/24 Left  02/02/24 Left 02/08/24 Left 02/22/24  Hip flexion        Hip extension        Hip abduction        Hip adduction        Hip internal rotation        Hip external rotation        Knee flexion        Knee extension        Ankle dorsiflexion -25* -10* 0 5 9 11   Ankle plantarflexion 58* 61* 45 45 45 58  Ankle inversion 21* 26* 29 30 34 35  Ankle eversion 5*  7* 19 26 23 25    (Blank rows = not tested)  LOWER EXTREMITY MMT:  MMT Left eval Left 02/02/24 Left 02/15/24  Hip flexion     Hip extension     Hip abduction     Hip adduction     Hip internal rotation     Hip external rotation     Knee flexion     Knee extension     Ankle dorsiflexion 3- 4 4+  Ankle plantarflexion 3- 4 3- from standing heel lift test   Ankle inversion  4 4+ arch pain  Ankle eversion   4 arch pain    (Blank rows = not tested)   GAIT: Distance walked: deferred today due to pain/pulling at surgical site   Able to transfer and wt shift well with RW      02/15/24 0001  Standardized Balance Assessment  Standardized Balance Assessment Dynamic Gait Index  Dynamic Gait Index  Level Surface 3  Change in Gait Speed 3  Gait with Horizontal Head Turns 2  Gait with Vertical Head Turns 2  Gait and Pivot Turn 2  Step Over Obstacle 2  Step Around Obstacles 2  Steps 2  Total Score 18                                                                                                                                  TREATMENT DATE:  03/06/24 Bike L 3 x 6 min Gait around widest part of back building in parking lot  Two flight of stairs alt pattern with and without rail  Sit to stand LE on mat holding 3lb  dumbbell  SLS 3x5'' each 2 in box on 2 airex step ups x10 each In resisted gait 30lb posterior pull alt box taps Resisted gait w/ 6in step ups 20lb x3 each    02/22/24 Bike L3 x 6 min Gait around widest part of back building in parking lot 6in box on airex forward and lateral step ups x10  30lb resisted gait 4 way x 3 each  4 way ankle blue 2x10  SLS LLE 3x5''  02/20/24 Bike L3 x 6 min Gait to the back building 2 flights of stairs, alt pattern, with and without 1 rail Gait around two islands un the back parking lot on the L side Sit to stand on airex w/ yellow ball chest press 2x10  Side step in upside down 6in box x5 each  Upside down box on airex HS curls 20lb 2x12 Leg Ext 10lb 2x12  02/15/24  Scifit L4x8 minutes for w/u  Rocker board AP x20 standing Rocker board inversion/eversion x20 standing   PSFS, MMT, DGI and goal updates    Tandem stance blue foam pad 3x30 seconds B  Tandem walks solid surface x3 full laps // bars Step overs blue foam pad x10 B 3 way taps off blue foam pad 2x5 B  PATIENT EDUCATION:  Education details: exam findings, POC, HEP, encouraged WBAT, ice, elevation  Person educated: Patient Education method: Explanation, Demonstration, and Handouts Education comprehension: verbalized understanding, returned demonstration, and needs further education  HOME EXERCISE PROGRAM:  Access Code: [email or web address] URL: [email or web address] Date: 01/02/2024 Prepared by: Josette Rough  Exercises - Supine Ankle Pumps in Elevation on Pillows  - 2-3 x daily - 7 x weekly - 1 sets - 10 reps - Ankle Circles in Elevation  - 2-3 x daily - 7 x weekly - 1 sets - 10 reps - Ankle Alphabet in Elevation  - 2-3 x daily - 7 x weekly - 1 sets - 10 reps - Lateral Weight Shift with Parallel Bars (BKA)  - 1 x daily - 7 x weekly - 1 sets - 10 reps - 2 seconds  hold - Supine Active Straight Leg Raise  - 1 x daily - 7 x weekly - 2 sets - 10 reps - Sidelying Hip  Abduction  - 1 x daily - 7 x weekly - 2 sets - 10 reps - Seated Long Arc Quad  - 1 x daily - 7 x weekly - 2 sets - 10 reps  ASSESSMENT:  CLINICAL IMPRESSION:  Arrives today doing well. Again biggest concern at this is arch pain. Trying to progress functional activities so pt feels comfortable doing the activities she use to do like church events and walking. All interventions completed well some instability with airex interventions. Will continue to challenge her with functional interventions to help gain more confidence with her mobility. Good effort during session.  OBJECTIVE IMPAIRMENTS: Abnormal gait, decreased activity tolerance, decreased balance, decreased knowledge of use of DME, decreased mobility, difficulty walking, decreased ROM, decreased strength, and pain.   ACTIVITY LIMITATIONS: standing, stairs, transfers, and locomotion level  PARTICIPATION LIMITATIONS: meal prep, cleaning, laundry, driving, shopping, and community activity  PERSONAL FACTORS: Behavior pattern, Education, Fitness, Social background, and Time since onset of injury/illness/exacerbation are also affecting patient's functional outcome.   REHAB POTENTIAL: Good  CLINICAL DECISION MAKING: Stable/uncomplicated  EVALUATION COMPLEXITY: Low   GOALS: Goals reviewed with patient? No  SHORT TERM GOALS: Target date: 03/07/2024     Will be compliant with appropriate progressive HEP  Baseline: Goal status: Met 01/10/24  2.  L ankle AROM to improve to: -10* DF, PF to 65*, eversion to 12* Baseline:  Goal status: ONGOING 01/02/24 see above, Progressing 01/10/24, Progressing 02/08/24, Partly Met 02/22/24  3.  Will be independent with appropriate edema management techniques  Baseline:  Goal status: Met 01/10/24  4.  Will be consistently ambulatory for household distances with LRAD  Baseline:  Goal status: Progressing 01/10/24, Partly met slight limp 02/22/24    LONG TERM GOALS: 03/28/2024        MMT to be at least  4/5 L ankle  Baseline:  Goal status: ONGOING 02/15/24 see chart   2.  L ankle AROM to be as follows: DF 10* Baseline:  Goal status: Progressing 02/02/24, progressing 02/08/24, Met 02/22/24  3.  Will score at least 20/24 on DGI  Baseline:  Goal status: ONGOING 02/15/24 see chart   4.  Will be able to ambulate community distances with LRAD, L ankle pain no more than 3/10 Baseline:  Goal status: ONGOING 02/15/24 hasn't tried community ambulation (grocery store only now) without cart  5.  PSFS to have improved by 3 points  Baseline:  Goal status: MET 02/15/24     PLAN:  PT FREQUENCY: 2x/week  PT DURATION: 6  weeks  PLANNED INTERVENTIONS: 97750- Physical Performance Testing, 97110-Therapeutic exercises, 97530- Therapeutic activity, 97112- Neuromuscular re-education, 97535- Self Care, [id number]- Manual therapy, [phone]- Gait training, [phone]- Vasopneumatic device, and Cryotherapy  PLAN FOR NEXT SESSION: focus on higher level balance and functional strength/ankle stability, needs HEP update and work on transition to independent program by end of cert period  (late September)   Tanda Sorrow, PTA 03/06/24 11:00 AM

## 2024-03-08 ENCOUNTER — Encounter: Payer: Self-pay | Admitting: Physical Therapy

## 2024-03-08 ENCOUNTER — Ambulatory Visit: Admitting: Physical Therapy

## 2024-03-08 DIAGNOSIS — M25572 Pain in left ankle and joints of left foot: Secondary | ICD-10-CM

## 2024-03-08 DIAGNOSIS — R2681 Unsteadiness on feet: Secondary | ICD-10-CM | POA: Diagnosis not present

## 2024-03-08 DIAGNOSIS — M25672 Stiffness of left ankle, not elsewhere classified: Secondary | ICD-10-CM | POA: Diagnosis not present

## 2024-03-08 DIAGNOSIS — Z23 Encounter for immunization: Secondary | ICD-10-CM | POA: Diagnosis not present

## 2024-03-08 DIAGNOSIS — R262 Difficulty in walking, not elsewhere classified: Secondary | ICD-10-CM | POA: Diagnosis not present

## 2024-03-08 DIAGNOSIS — R600 Localized edema: Secondary | ICD-10-CM

## 2024-03-08 NOTE — Therapy (Signed)
 OUTPATIENT PHYSICAL THERAPY LOWER EXTREMITY TREATMENT/RECERT   Patient Name: Shelly Pearson MRN: [id number] DOB:24-Jun-1941, 83 y.o., female Today's Date: 03/08/2024  END OF SESSION:  PT End of Session - 03/08/24 0926     Visit Number 17    Date for PT Re-Evaluation 03/28/24    PT Start Time 0928    PT Stop Time 1013    PT Time Calculation (min) 45 min    Activity Tolerance Patient tolerated treatment well    Behavior During Therapy [facility] for tasks assessed/performed             Past Medical History:  Diagnosis Date   Allergy 60 or so years ago   Pollen, bandaids, pollen   Arthritis 20 or more years ago   Thumbs, feet, shoulder, back, etc.   B12 deficiency 03/02/2020   GERD (gastroesophageal reflux disease)    Hyperlipidemia Been on atorvastatin  for ? years   Hypertension    Seasonal allergies    Thyroid  disease    Urge incontinence of urine 12/10/2020   Urgency incontinence    Past Surgical History:  Procedure Laterality Date   ABDOMINAL HYSTERECTOMY     APPENDECTOMY  12/18/71   When daughter was born by c-section   CESAREAN SECTION     CYSTOCELE REPAIR     FRACTURE SURGERY     HARDWARE REMOVAL Left 12/19/2023   Procedure: REMOVAL, HARDWARE;  Surgeon: Genelle Standing, MD;  Location: [facility];  Service: Orthopedics;  Laterality: Left;  LEFT  ANKLE REMOVAL OF HARDWARE   ORIF ANKLE FRACTURE Left 06/08/2023   Procedure: LEFT OPEN REDUCTION INTERNAL FIXATION (ORIF) ANKLE FRACTURE;  Surgeon: Genelle Standing, MD;  Location: [facility];  Service: Orthopedics;  Laterality: Left;   ROTATOR CUFF REPAIR Right    2018   Patient Active Problem List   Diagnosis Date Noted   Pain from implanted hardware 12/19/2023   Status post open reduction with internal fixation (ORIF) of fracture of ankle- Left 10/06/2023   Arthritis of midtarsal joint of left foot 10/06/2023   Exudative age-related macular degeneration, right eye, with active choroidal neovascularization  (HCC) 05/24/2022   Decreased grip strength of right hand 09/16/2021   Gait disturbance 07/12/2021   Mild cognitive impairment 07/12/2021   Thyroid  goiter 12/10/2020   Mixed hyperlipidemia 12/10/2020   Degenerative cervical spinal stenosis, mild C4-C5 and C5-C6 12/10/2020   Lumbar degenerative disc disease, L4-L5 with left lateral recess stenosis 12/10/2020   Personal history of fall 09/01/2020   Congenital cavus deformity of both feet 03/02/2020   Metatarsalgia of both feet 03/02/2020   Hand arthritis 03/02/2020   Arthritis of carpometacarpal (CMC) joint of both thumbs 09/19/2019   Overactive bladder 09/19/2019   Sciatica 04/26/2019   Paresthesia of both feet 02/04/2019   Epidermal cyst 04/17/2018   Allergic rhinitis 06/23/2015   Eczema 06/23/2015   Gastro-esophageal reflux disease without esophagitis 06/23/2015   Essential hypertension 06/23/2015   Hypothyroidism 06/23/2015    PCP: Thedora Senior MD   REFERRING PROVIDER: Genelle Standing, MD  REFERRING DIAG:  Diagnosis  [id number] (ICD-10-CM) - Bimalleolar ankle fracture, left, closed, initial encounter    THERAPY DIAG:  Difficulty in walking, not elsewhere classified  Stiffness of left ankle, not elsewhere classified  Unsteadiness on feet  Localized edema  Pain in left ankle and joints of left foot  Rationale for Evaluation and Treatment: Rehabilitation  ONSET DATE: L ankle ORIF 06/2023, hardware removal 12/19/23  SUBJECTIVE:   SUBJECTIVE STATEMENT:   Just  my arch that's what hurts, pretty mush all the time   EVAL:I was feeling pretty good after I finished PT, after that the right ankle I still had a lot of swelling and issues getting into shoes. It stayed tender and gave me some shooting pains down into the foot. Dr. Genelle said that it might help to take out the hardware. I have been careful with it, I know I can put weight on it but being careful when it hurts.  PERTINENT HISTORY:  See above in addition to  the following from MD note:   6 months status post left ankle open reduction internal fixation. She is somewhat symptomatic about her medial malleolar screws. She is also having symptoms about her peroneal tendon insertions at the plantar aspect of the foot as well as issues with neuropathy which she is having on the other side as well. Given this I do believe she would benefit from a custom orthotic. I will plan to see her back as needed should she want to consider medial malleolar screw removal PAIN:  Are you having pain? 3/10 R ankle arch   PRECAUTIONS: None  RED FLAGS: None   WEIGHT BEARING RESTRICTIONS:  weightbearing as tolerated per op note   FALLS:  Has patient fallen in last 6 months? No  LIVING ENVIRONMENT: Lives with: lives with their family Lives in: House/apartment Stairs: 4STE with rails, has second floor (steps with split landing) Has following equipment at home: Vannie - 2 wheeled and Wheelchair (manual)  OCCUPATION: retired- Microbiologist and flight attendant   PLOF: Independent, Independent with basic ADLs, Independent with gait, and Independent with transfers  PATIENT GOALS: be able to get around without sharp pains   NEXT MD VISIT: Dr. Genelle 01/03/24  OBJECTIVE:  Note: Objective measures were completed at Evaluation unless otherwise noted.    PATIENT SURVEYS:   THE PATIENT SPECIFIC FUNCTIONAL SCALE  Place score of 0-10 (0 = unable to perform activity and 10 = able to perform activity at the same level as before injury or problem)  Activity Date: Eval 12/26/23 02/15/24   Grocery shopping  0 5   2. Cleaning house  5 5   3. Cooking  2 8   4.      Total Score 2.3 6     Total Score = Sum of activity scores/number of activities  Minimally Detectable Change: 3 points (for single activity); 2 points (for average score)  [name] (nd). The Patient Specific Functional Scale . Retrieved from  [email or web address]   COGNITION: Overall cognitive status: hx of STM impairment      SENSATION: Not tested  EDEMA:   Appropriate for post-op status, good capillary refill       LOWER EXTREMITY ROM:  Active ROM Left eval Left 01/02/24 Left 01/10/24 Left  02/02/24 Left 02/08/24 Left 02/22/24   Hip flexion         Hip extension         Hip abduction         Hip adduction         Hip internal rotation         Hip external rotation         Knee flexion         Knee extension         Ankle dorsiflexion -25* -10* 0 5 9 11    Ankle plantarflexion 58* 61* 45 45 45 58   Ankle inversion 21* 26*  29 30 34 35   Ankle eversion 5* 7* 19 26 23 25     (Blank rows = not tested)  LOWER EXTREMITY MMT:  MMT Left eval Left 02/02/24 Left 02/15/24  Hip flexion     Hip extension     Hip abduction     Hip adduction     Hip internal rotation     Hip external rotation     Knee flexion     Knee extension     Ankle dorsiflexion 3- 4 4+  Ankle plantarflexion 3- 4 3- from standing heel lift test   Ankle inversion  4 4+ arch pain  Ankle eversion   4 arch pain    (Blank rows = not tested)   GAIT: Distance walked: deferred today due to pain/pulling at surgical site   Able to transfer and wt shift well with RW      02/15/24 0001  Standardized Balance Assessment  Standardized Balance Assessment Dynamic Gait Index  Dynamic Gait Index  Level Surface 3  Change in Gait Speed 3  Gait with Horizontal Head Turns 2  Gait with Vertical Head Turns 2  Gait and Pivot Turn 2  Step Over Obstacle 2  Step Around Obstacles 2  Steps 2  Total Score 18                                                                                                                                  TREATMENT DATE:  03/08/24 Bike L 3 x 6 min DGI   2 in box on 2 airex step ups forward & lateral x10 each Slant board calf stretch 20 lb resisted side steps over WaTE x5 each HS  curls 25lb 2x12 Leg Ext 10lb 2x10 Side step in upside down 6in box from and on to airex x5 each Sit to stand on airex w/ ball toss 2x10  03/06/24 Bike L 3 x 6 min Gait around widest part of back building in parking lot  Two flight of stairs alt pattern with and without rail  Sit to stand LE on mat holding 3lb dumbbell  SLS 3x5'' each 2 in box on 2 airex step ups x10 each In resisted gait 30lb posterior pull alt box taps Resisted gait w/ 6in step ups 20lb x3 each    02/22/24 Bike L3 x 6 min Gait around widest part of back building in parking lot 6in box on airex forward and lateral step ups x10  30lb resisted gait 4 way x 3 each  4 way ankle blue 2x10  SLS LLE 3x5''  02/20/24 Bike L3 x 6 min Gait to the back building 2 flights of stairs, alt pattern, with and without 1 rail Gait around two islands un the back parking lot on the L side Sit to stand on airex w/ yellow ball chest press 2x10  Side step in upside down 6in box x5 each  Upside down box on  airex HS curls 20lb 2x12 Leg Ext 10lb 2x12  02/15/24  Scifit L4x8 minutes for w/u  Rocker board AP x20 standing Rocker board inversion/eversion x20 standing   PSFS, MMT, DGI and goal updates    Tandem stance blue foam pad 3x30 seconds B  Tandem walks solid surface x3 full laps // bars Step overs blue foam pad x10 B 3 way taps off blue foam pad 2x5 B        PATIENT EDUCATION:  Education details: exam findings, POC, HEP, encouraged WBAT, ice, elevation  Person educated: Patient Education method: Explanation, Demonstration, and Handouts Education comprehension: verbalized understanding, returned demonstration, and needs further education  HOME EXERCISE PROGRAM:  Access Code: [email or web address] URL: [email or web address] Date: 01/02/2024 Prepared by: Josette Rough  Exercises - Supine Ankle Pumps in Elevation on Pillows  - 2-3 x daily - 7 x weekly - 1 sets - 10 reps - Ankle Circles in Elevation  - 2-3 x daily -  7 x weekly - 1 sets - 10 reps - Ankle Alphabet in Elevation  - 2-3 x daily - 7 x weekly - 1 sets - 10 reps - Lateral Weight Shift with Parallel Bars (BKA)  - 1 x daily - 7 x weekly - 1 sets - 10 reps - 2 seconds  hold - Supine Active Straight Leg Raise  - 1 x daily - 7 x weekly - 2 sets - 10 reps - Sidelying Hip Abduction  - 1 x daily - 7 x weekly - 2 sets - 10 reps - Seated Long Arc Quad  - 1 x daily - 7 x weekly - 2 sets - 10 reps  ASSESSMENT:  CLINICAL IMPRESSION:  Arrives today doing well. Again biggest concern at this is arch pain. Continued progression of  functional activities so pt feels comfortable doing the activities she use to do like church events and walking. All interventions completed well some instability with airex interventions. She has progressed meeting DGI goal. Will continue to challenge her with functional interventions to help gain more confidence with her mobility. Good effort during session.  OBJECTIVE IMPAIRMENTS: Abnormal gait, decreased activity tolerance, decreased balance, decreased knowledge of use of DME, decreased mobility, difficulty walking, decreased ROM, decreased strength, and pain.   ACTIVITY LIMITATIONS: standing, stairs, transfers, and locomotion level  PARTICIPATION LIMITATIONS: meal prep, cleaning, laundry, driving, shopping, and community activity  PERSONAL FACTORS: Behavior pattern, Education, Fitness, Social background, and Time since onset of injury/illness/exacerbation are also affecting patient's functional outcome.   REHAB POTENTIAL: Good  CLINICAL DECISION MAKING: Stable/uncomplicated  EVALUATION COMPLEXITY: Low   GOALS: Goals reviewed with patient? No  SHORT TERM GOALS: Target date: 03/07/2024     Will be compliant with appropriate progressive HEP  Baseline: Goal status: Met 01/10/24  2.  L ankle AROM to improve to: -10* DF, PF to 65*, eversion to 12* Baseline:  Goal status: ONGOING 01/02/24 see above, Progressing 01/10/24,  Progressing 02/08/24, Partly Met 02/22/24  3.  Will be independent with appropriate edema management techniques  Baseline:  Goal status: Met 01/10/24  4.  Will be consistently ambulatory for household distances with LRAD  Baseline:  Goal status: Progressing 01/10/24, Partly met slight limp 02/22/24    LONG TERM GOALS: 03/28/2024        MMT to be at least 4/5 L ankle  Baseline:  Goal status: ONGOING 02/15/24 see chart   2.  L ankle AROM to be as follows: DF 10* Baseline:  Goal status: Progressing 02/02/24, progressing 02/08/24,  Met 02/22/24  3.  Will score at least 20/24 on DGI  Baseline:  Goal status: ONGOING 02/15/24 see chart, 03/08/24 Met 22/4  4.  Will be able to ambulate community distances with LRAD, L ankle pain no more than 3/10 Baseline:  Goal status: ONGOING 02/15/24 hasn't tried community ambulation (grocery store only now) without cart  5.  PSFS to have improved by 3 points  Baseline:  Goal status: MET 02/15/24     PLAN:  PT FREQUENCY: 2x/week  PT DURATION: 6 weeks  PLANNED INTERVENTIONS: 97750- Physical Performance Testing, 97110-Therapeutic exercises, 97530- Therapeutic activity, 97112- Neuromuscular re-education, 97535- Self Care, [id number]- Manual therapy, [id number]- Gait training, [phone]- Vasopneumatic device, and Cryotherapy  PLAN FOR NEXT SESSION: focus on higher level balance and functional strength/ankle stability, needs HEP update and work on transition to independent program by end of cert period  (late September)   Tanda Sorrow, PTA 03/08/24 9:27 AM

## 2024-03-12 ENCOUNTER — Ambulatory Visit: Admitting: Physical Therapy

## 2024-03-12 ENCOUNTER — Encounter: Payer: Self-pay | Admitting: Physical Therapy

## 2024-03-12 DIAGNOSIS — R2681 Unsteadiness on feet: Secondary | ICD-10-CM | POA: Diagnosis not present

## 2024-03-12 DIAGNOSIS — R262 Difficulty in walking, not elsewhere classified: Secondary | ICD-10-CM

## 2024-03-12 DIAGNOSIS — R600 Localized edema: Secondary | ICD-10-CM | POA: Diagnosis not present

## 2024-03-12 DIAGNOSIS — M25572 Pain in left ankle and joints of left foot: Secondary | ICD-10-CM | POA: Diagnosis not present

## 2024-03-12 DIAGNOSIS — M25672 Stiffness of left ankle, not elsewhere classified: Secondary | ICD-10-CM

## 2024-03-12 NOTE — Therapy (Signed)
 OUTPATIENT PHYSICAL THERAPY LOWER EXTREMITY TREATMENT/RECERT   Patient Name: Shelly Pearson MRN: [id number] DOB:Apr 09, 1941, 83 y.o., female Today's Date: 03/12/2024  END OF SESSION:  PT End of Session - 03/12/24 0929     Visit Number 18    Date for PT Re-Evaluation 03/28/24    PT Start Time 0929    PT Stop Time 1015    PT Time Calculation (min) 46 min    Activity Tolerance Patient tolerated treatment well    Behavior During Therapy [facility] for tasks assessed/performed             Past Medical History:  Diagnosis Date   Allergy 60 or so years ago   Pollen, bandaids, pollen   Arthritis 20 or more years ago   Thumbs, feet, shoulder, back, etc.   B12 deficiency 03/02/2020   GERD (gastroesophageal reflux disease)    Hyperlipidemia Been on atorvastatin  for ? years   Hypertension    Seasonal allergies    Thyroid  disease    Urge incontinence of urine 12/10/2020   Urgency incontinence    Past Surgical History:  Procedure Laterality Date   ABDOMINAL HYSTERECTOMY     APPENDECTOMY  12/18/71   When daughter was born by c-section   CESAREAN SECTION     CYSTOCELE REPAIR     FRACTURE SURGERY     HARDWARE REMOVAL Left 12/19/2023   Procedure: REMOVAL, HARDWARE;  Surgeon: Genelle Standing, MD;  Location: [facility];  Service: Orthopedics;  Laterality: Left;  LEFT  ANKLE REMOVAL OF HARDWARE   ORIF ANKLE FRACTURE Left 06/08/2023   Procedure: LEFT OPEN REDUCTION INTERNAL FIXATION (ORIF) ANKLE FRACTURE;  Surgeon: Genelle Standing, MD;  Location: [facility];  Service: Orthopedics;  Laterality: Left;   ROTATOR CUFF REPAIR Right    2018   Patient Active Problem List   Diagnosis Date Noted   Pain from implanted hardware 12/19/2023   Status post open reduction with internal fixation (ORIF) of fracture of ankle- Left 10/06/2023   Arthritis of midtarsal joint of left foot 10/06/2023   Exudative age-related macular degeneration, right eye, with active choroidal neovascularization  (HCC) 05/24/2022   Decreased grip strength of right hand 09/16/2021   Gait disturbance 07/12/2021   Mild cognitive impairment 07/12/2021   Thyroid  goiter 12/10/2020   Mixed hyperlipidemia 12/10/2020   Degenerative cervical spinal stenosis, mild C4-C5 and C5-C6 12/10/2020   Lumbar degenerative disc disease, L4-L5 with left lateral recess stenosis 12/10/2020   Personal history of fall 09/01/2020   Congenital cavus deformity of both feet 03/02/2020   Metatarsalgia of both feet 03/02/2020   Hand arthritis 03/02/2020   Arthritis of carpometacarpal (CMC) joint of both thumbs 09/19/2019   Overactive bladder 09/19/2019   Sciatica 04/26/2019   Paresthesia of both feet 02/04/2019   Epidermal cyst 04/17/2018   Allergic rhinitis 06/23/2015   Eczema 06/23/2015   Gastro-esophageal reflux disease without esophagitis 06/23/2015   Essential hypertension 06/23/2015   Hypothyroidism 06/23/2015    PCP: Thedora Senior MD   REFERRING PROVIDER: Genelle Standing, MD  REFERRING DIAG:  Diagnosis  [phone] (ICD-10-CM) - Bimalleolar ankle fracture, left, closed, initial encounter    THERAPY DIAG:  Difficulty in walking, not elsewhere classified  Stiffness of left ankle, not elsewhere classified  Unsteadiness on feet  Localized edema  Pain in left ankle and joints of left foot  Rationale for Evaluation and Treatment: Rehabilitation  ONSET DATE: L ankle ORIF 06/2023, hardware removal 12/19/23  SUBJECTIVE:   SUBJECTIVE STATEMENT:   I  am feeling ok Some discomfort last time  EVAL:I was feeling pretty good after I finished PT, after that the right ankle I still had a lot of swelling and issues getting into shoes. It stayed tender and gave me some shooting pains down into the foot. Dr. Genelle said that it might help to take out the hardware. I have been careful with it, I know I can put weight on it but being careful when it hurts.  PERTINENT HISTORY:  See above in addition to the following  from MD note:   6 months status post left ankle open reduction internal fixation. She is somewhat symptomatic about her medial malleolar screws. She is also having symptoms about her peroneal tendon insertions at the plantar aspect of the foot as well as issues with neuropathy which she is having on the other side as well. Given this I do believe she would benefit from a custom orthotic. I will plan to see her back as needed should she want to consider medial malleolar screw removal PAIN:  Are you having pain? 3/10 R ankle arch   PRECAUTIONS: None  RED FLAGS: None   WEIGHT BEARING RESTRICTIONS:  weightbearing as tolerated per op note   FALLS:  Has patient fallen in last 6 months? No  LIVING ENVIRONMENT: Lives with: lives with their family Lives in: House/apartment Stairs: 4STE with rails, has second floor (steps with split landing) Has following equipment at home: Vannie - 2 wheeled and Wheelchair (manual)  OCCUPATION: retired- Microbiologist and flight attendant   PLOF: Independent, Independent with basic ADLs, Independent with gait, and Independent with transfers  PATIENT GOALS: be able to get around without sharp pains   NEXT MD VISIT: Dr. Genelle 01/03/24  OBJECTIVE:  Note: Objective measures were completed at Evaluation unless otherwise noted.    PATIENT SURVEYS:   THE PATIENT SPECIFIC FUNCTIONAL SCALE  Place score of 0-10 (0 = unable to perform activity and 10 = able to perform activity at the same level as before injury or problem)  Activity Date: Eval 12/26/23 02/15/24   Grocery shopping  0 5   2. Cleaning house  5 5   3. Cooking  2 8   4.      Total Score 2.3 6     Total Score = Sum of activity scores/number of activities  Minimally Detectable Change: 3 points (for single activity); 2 points (for average score)  [name] (nd). The Patient Specific Functional Scale . Retrieved from  [email or web address]   COGNITION: Overall cognitive status: hx of STM impairment      SENSATION: Not tested  EDEMA:   Appropriate for post-op status, good capillary refill       LOWER EXTREMITY ROM:  Active ROM Left eval Left 01/02/24 Left 01/10/24 Left  02/02/24 Left 02/08/24 Left 02/22/24   Hip flexion         Hip extension         Hip abduction         Hip adduction         Hip internal rotation         Hip external rotation         Knee flexion         Knee extension         Ankle dorsiflexion -25* -10* 0 5 9 11    Ankle plantarflexion 58* 61* 45 45 45 58   Ankle inversion 21* 26* 29 30 34 35  Ankle eversion 5* 7* 19 26 23 25     (Blank rows = not tested)  LOWER EXTREMITY MMT:  MMT Left eval Left 02/02/24 Left 02/15/24  Hip flexion     Hip extension     Hip abduction     Hip adduction     Hip internal rotation     Hip external rotation     Knee flexion     Knee extension     Ankle dorsiflexion 3- 4 4+  Ankle plantarflexion 3- 4 3- from standing heel lift test   Ankle inversion  4 4+ arch pain  Ankle eversion   4 arch pain    (Blank rows = not tested)   GAIT: Distance walked: deferred today due to pain/pulling at surgical site   Able to transfer and wt shift well with RW      02/15/24 0001  Standardized Balance Assessment  Standardized Balance Assessment Dynamic Gait Index  Dynamic Gait Index  Level Surface 3  Change in Gait Speed 3  Gait with Horizontal Head Turns 2  Gait with Vertical Head Turns 2  Gait and Pivot Turn 2  Step Over Obstacle 2  Step Around Obstacles 2  Steps 2  Total Score 18                                                                                                                                  TREATMENT DATE:  03/12/24 Bike L 3 x 7 min Gait around widest part of back building in parking lot HS curls 25lb 2x15 Leg Ext 10lb 2x15 Shoulder ext 5lb standing on airex Toe  raises on black bar 2x15 Slant board calf stretch Leg press 30lb 2x10   03/08/24 Bike L 3 x 6 min DGI   2 in box on 2 airex step ups forward & lateral x10 each Slant board calf stretch 20 lb resisted side steps over WaTE x5 each HS curls 25lb 2x12 Leg Ext 10lb 2x10 Side step in upside down 6in box from and on to airex x5 each Sit to stand on airex w/ ball toss 2x10  03/06/24 Bike L 3 x 6 min Gait around widest part of back building in parking lot  Two flight of stairs alt pattern with and without rail  Sit to stand LE on mat holding 3lb dumbbell  SLS 3x5'' each 2 in box on 2 airex step ups x10 each In resisted gait 30lb posterior pull alt box taps Resisted gait w/ 6in step ups 20lb x3 each    02/22/24 Bike L3 x 6 min Gait around widest part of back building in parking lot 6in box on airex forward and lateral step ups x10  30lb resisted gait 4 way x 3 each  4 way ankle blue 2x10  SLS LLE 3x5''  02/20/24 Bike L3 x 6 min Gait to the back building 2 flights of stairs, alt pattern, with and without  1 rail Gait around two islands un the back parking lot on the L side Sit to stand on airex w/ yellow ball chest press 2x10  Side step in upside down 6in box x5 each  Upside down box on airex HS curls 20lb 2x12 Leg Ext 10lb 2x12  02/15/24  Scifit L4x8 minutes for w/u  Rocker board AP x20 standing Rocker board inversion/eversion x20 standing   PSFS, MMT, DGI and goal updates    Tandem stance blue foam pad 3x30 seconds B  Tandem walks solid surface x3 full laps // bars Step overs blue foam pad x10 B 3 way taps off blue foam pad 2x5 B        PATIENT EDUCATION:  Education details: exam findings, POC, HEP, encouraged WBAT, ice, elevation  Person educated: Patient Education method: Explanation, Demonstration, and Handouts Education comprehension: verbalized understanding, returned demonstration, and needs further education  HOME EXERCISE PROGRAM:  Access Code:  [email or web address] URL: [email or web address] Date: 01/02/2024 Prepared by: Josette Rough  Exercises - Supine Ankle Pumps in Elevation on Pillows  - 2-3 x daily - 7 x weekly - 1 sets - 10 reps - Ankle Circles in Elevation  - 2-3 x daily - 7 x weekly - 1 sets - 10 reps - Ankle Alphabet in Elevation  - 2-3 x daily - 7 x weekly - 1 sets - 10 reps - Lateral Weight Shift with Parallel Bars (BKA)  - 1 x daily - 7 x weekly - 1 sets - 10 reps - 2 seconds  hold - Supine Active Straight Leg Raise  - 1 x daily - 7 x weekly - 2 sets - 10 reps - Sidelying Hip Abduction  - 1 x daily - 7 x weekly - 2 sets - 10 reps - Seated Long Arc Quad  - 1 x daily - 7 x weekly - 2 sets - 10 reps  ASSESSMENT:  CLINICAL IMPRESSION:  Pt enters doing well. Again biggest concern at this is arch pain. Continued progression of functional activities she reports that he could feel her gluts with outdoor ambulation. All interventions completed well some postural instability with shoulder Ext.  Will continue to challenge her with functional interventions to help gain more confidence with her mobility. Good effort during session.  OBJECTIVE IMPAIRMENTS: Abnormal gait, decreased activity tolerance, decreased balance, decreased knowledge of use of DME, decreased mobility, difficulty walking, decreased ROM, decreased strength, and pain.   ACTIVITY LIMITATIONS: standing, stairs, transfers, and locomotion level  PARTICIPATION LIMITATIONS: meal prep, cleaning, laundry, driving, shopping, and community activity  PERSONAL FACTORS: Behavior pattern, Education, Fitness, Social background, and Time since onset of injury/illness/exacerbation are also affecting patient's functional outcome.   REHAB POTENTIAL: Good  CLINICAL DECISION MAKING: Stable/uncomplicated  EVALUATION COMPLEXITY: Low   GOALS: Goals reviewed with patient? No  SHORT TERM GOALS: Target date: 03/07/2024     Will be compliant with appropriate progressive HEP   Baseline: Goal status: Met 01/10/24  2.  L ankle AROM to improve to: -10* DF, PF to 65*, eversion to 12* Baseline:  Goal status: ONGOING 01/02/24 see above, Progressing 01/10/24, Progressing 02/08/24, Partly Met 02/22/24  3.  Will be independent with appropriate edema management techniques  Baseline:  Goal status: Met 01/10/24  4.  Will be consistently ambulatory for household distances with LRAD  Baseline:  Goal status: Progressing 01/10/24, Partly met slight limp 02/22/24    LONG TERM GOALS: 03/28/2024        MMT to be at least 4/5 L  ankle  Baseline:  Goal status: ONGOING 02/15/24 see chart   2.  L ankle AROM to be as follows: DF 10* Baseline:  Goal status: Progressing 02/02/24, progressing 02/08/24, Met 02/22/24  3.  Will score at least 20/24 on DGI  Baseline:  Goal status: ONGOING 02/15/24 see chart, 03/08/24 Met 22/4  4.  Will be able to ambulate community distances with LRAD, L ankle pain no more than 3/10 Baseline:  Goal status: ONGOING 02/15/24 hasn't tried community ambulation (grocery store only now) without cart  5.  PSFS to have improved by 3 points  Baseline:  Goal status: MET 02/15/24     PLAN:  PT FREQUENCY: 2x/week  PT DURATION: 6 weeks  PLANNED INTERVENTIONS: 97750- Physical Performance Testing, 97110-Therapeutic exercises, 97530- Therapeutic activity, 97112- Neuromuscular re-education, 97535- Self Care, [id number]- Manual therapy, [phone]- Gait training, [phone]- Vasopneumatic device, and Cryotherapy  PLAN FOR NEXT SESSION: focus on higher level balance and functional strength/ankle stability, needs HEP update and work on transition to independent program by end of cert period  (late September), possible D/C   Tanda Sorrow, PTA 03/12/24 9:30 AM

## 2024-03-14 ENCOUNTER — Encounter: Payer: Self-pay | Admitting: Physical Therapy

## 2024-03-14 ENCOUNTER — Ambulatory Visit: Admitting: Physical Therapy

## 2024-03-14 DIAGNOSIS — R600 Localized edema: Secondary | ICD-10-CM | POA: Diagnosis not present

## 2024-03-14 DIAGNOSIS — M25572 Pain in left ankle and joints of left foot: Secondary | ICD-10-CM | POA: Diagnosis not present

## 2024-03-14 DIAGNOSIS — R2681 Unsteadiness on feet: Secondary | ICD-10-CM

## 2024-03-14 DIAGNOSIS — R262 Difficulty in walking, not elsewhere classified: Secondary | ICD-10-CM | POA: Diagnosis not present

## 2024-03-14 DIAGNOSIS — M25672 Stiffness of left ankle, not elsewhere classified: Secondary | ICD-10-CM

## 2024-03-14 NOTE — Therapy (Signed)
 OUTPATIENT PHYSICAL THERAPY LOWER EXTREMITY TREATMENT/RECERT   Patient Name: Shelly Pearson MRN: [id number] DOB:1941/06/15, 83 y.o., female Today's Date: 03/14/2024  END OF SESSION:  PT End of Session - 03/14/24 1059     Visit Number 19    Date for PT Re-Evaluation 03/28/24    PT Start Time 1100    PT Stop Time 1145    PT Time Calculation (min) 45 min    Activity Tolerance Patient tolerated treatment well    Behavior During Therapy [facility] for tasks assessed/performed             Past Medical History:  Diagnosis Date   Allergy 60 or so years ago   Pollen, bandaids, pollen   Arthritis 20 or more years ago   Thumbs, feet, shoulder, back, etc.   B12 deficiency 03/02/2020   GERD (gastroesophageal reflux disease)    Hyperlipidemia Been on atorvastatin  for ? years   Hypertension    Seasonal allergies    Thyroid  disease    Urge incontinence of urine 12/10/2020   Urgency incontinence    Past Surgical History:  Procedure Laterality Date   ABDOMINAL HYSTERECTOMY     APPENDECTOMY  12/18/71   When daughter was born by c-section   CESAREAN SECTION     CYSTOCELE REPAIR     FRACTURE SURGERY     HARDWARE REMOVAL Left 12/19/2023   Procedure: REMOVAL, HARDWARE;  Surgeon: Genelle Standing, MD;  Location: [facility];  Service: Orthopedics;  Laterality: Left;  LEFT  ANKLE REMOVAL OF HARDWARE   ORIF ANKLE FRACTURE Left 06/08/2023   Procedure: LEFT OPEN REDUCTION INTERNAL FIXATION (ORIF) ANKLE FRACTURE;  Surgeon: Genelle Standing, MD;  Location: [facility];  Service: Orthopedics;  Laterality: Left;   ROTATOR CUFF REPAIR Right    2018   Patient Active Problem List   Diagnosis Date Noted   Pain from implanted hardware 12/19/2023   Status post open reduction with internal fixation (ORIF) of fracture of ankle- Left 10/06/2023   Arthritis of midtarsal joint of left foot 10/06/2023   Exudative age-related macular degeneration, right eye, with active choroidal neovascularization  (HCC) 05/24/2022   Decreased grip strength of right hand 09/16/2021   Gait disturbance 07/12/2021   Mild cognitive impairment 07/12/2021   Thyroid  goiter 12/10/2020   Mixed hyperlipidemia 12/10/2020   Degenerative cervical spinal stenosis, mild C4-C5 and C5-C6 12/10/2020   Lumbar degenerative disc disease, L4-L5 with left lateral recess stenosis 12/10/2020   Personal history of fall 09/01/2020   Congenital cavus deformity of both feet 03/02/2020   Metatarsalgia of both feet 03/02/2020   Hand arthritis 03/02/2020   Arthritis of carpometacarpal (CMC) joint of both thumbs 09/19/2019   Overactive bladder 09/19/2019   Sciatica 04/26/2019   Paresthesia of both feet 02/04/2019   Epidermal cyst 04/17/2018   Allergic rhinitis 06/23/2015   Eczema 06/23/2015   Gastro-esophageal reflux disease without esophagitis 06/23/2015   Essential hypertension 06/23/2015   Hypothyroidism 06/23/2015    PCP: Thedora Senior MD   REFERRING PROVIDER: Genelle Standing, MD  REFERRING DIAG:  Diagnosis  [phone] (ICD-10-CM) - Bimalleolar ankle fracture, left, closed, initial encounter    THERAPY DIAG:  Difficulty in walking, not elsewhere classified  Stiffness of left ankle, not elsewhere classified  Pain in left ankle and joints of left foot  Localized edema  Unsteadiness on feet  Rationale for Evaluation and Treatment: Rehabilitation  ONSET DATE: L ankle ORIF 06/2023, hardware removal 12/19/23  SUBJECTIVE:   SUBJECTIVE STATEMENT:   Pretty  good  EVAL:I was feeling pretty good after I finished PT, after that the right ankle I still had a lot of swelling and issues getting into shoes. It stayed tender and gave me some shooting pains down into the foot. Dr. Genelle said that it might help to take out the hardware. I have been careful with it, I know I can put weight on it but being careful when it hurts.  PERTINENT HISTORY:  See above in addition to the following from MD note:   6 months  status post left ankle open reduction internal fixation. She is somewhat symptomatic about her medial malleolar screws. She is also having symptoms about her peroneal tendon insertions at the plantar aspect of the foot as well as issues with neuropathy which she is having on the other side as well. Given this I do believe she would benefit from a custom orthotic. I will plan to see her back as needed should she want to consider medial malleolar screw removal PAIN:  Are you having pain? 0/10   PRECAUTIONS: None  RED FLAGS: None   WEIGHT BEARING RESTRICTIONS:  weightbearing as tolerated per op note   FALLS:  Has patient fallen in last 6 months? No  LIVING ENVIRONMENT: Lives with: lives with their family Lives in: House/apartment Stairs: 4STE with rails, has second floor (steps with split landing) Has following equipment at home: Vannie - 2 wheeled and Wheelchair (manual)  OCCUPATION: retired- Microbiologist and flight attendant   PLOF: Independent, Independent with basic ADLs, Independent with gait, and Independent with transfers  PATIENT GOALS: be able to get around without sharp pains   NEXT MD VISIT: Dr. Genelle 01/03/24  OBJECTIVE:  Note: Objective measures were completed at Evaluation unless otherwise noted.    PATIENT SURVEYS:   THE PATIENT SPECIFIC FUNCTIONAL SCALE  Place score of 0-10 (0 = unable to perform activity and 10 = able to perform activity at the same level as before injury or problem)  Activity Date: Eval 12/26/23 02/15/24   Grocery shopping  0 5   2. Cleaning house  5 5   3. Cooking  2 8   4.      Total Score 2.3 6     Total Score = Sum of activity scores/number of activities  Minimally Detectable Change: 3 points (for single activity); 2 points (for average score)  [name] (nd). The Patient Specific Functional Scale . Retrieved from  [email or web address]   COGNITION: Overall cognitive status: hx of STM impairment      SENSATION: Not tested  EDEMA:   Appropriate for post-op status, good capillary refill       LOWER EXTREMITY ROM:  Active ROM Left eval Left 01/02/24 Left 01/10/24 Left  02/02/24 Left 02/08/24 Left 02/22/24   Hip flexion         Hip extension         Hip abduction         Hip adduction         Hip internal rotation         Hip external rotation         Knee flexion         Knee extension         Ankle dorsiflexion -25* -10* 0 5 9 11    Ankle plantarflexion 58* 61* 45 45 45 58   Ankle inversion 21* 26* 29 30 34 35   Ankle eversion 5* 7* 19 26 23  25    (Blank rows = not tested)  LOWER EXTREMITY MMT:  MMT Left eval Left 02/02/24 Left 02/15/24  Hip flexion     Hip extension     Hip abduction     Hip adduction     Hip internal rotation     Hip external rotation     Knee flexion     Knee extension     Ankle dorsiflexion 3- 4 4+  Ankle plantarflexion 3- 4 3- from standing heel lift test   Ankle inversion  4 4+ arch pain  Ankle eversion   4 arch pain    (Blank rows = not tested)   GAIT: Distance walked: deferred today due to pain/pulling at surgical site   Able to transfer and wt shift well with RW      02/15/24 0001  Standardized Balance Assessment  Standardized Balance Assessment Dynamic Gait Index  Dynamic Gait Index  Level Surface 3  Change in Gait Speed 3  Gait with Horizontal Head Turns 2  Gait with Vertical Head Turns 2  Gait and Pivot Turn 2  Step Over Obstacle 2  Step Around Obstacles 2  Steps 2  Total Score 18                                                                                                                                  TREATMENT DATE:  03/14/24 Bike L 3 x 7 min 30lb resisted gait 4 way x 4 each 4in box on airex forward & lateral step ups x10  Slant board calf stretch  HS curls 25lb 2x12  Leg  Ext 10lb 2x12 Leg press 40lb 2x12  Heel raises 2x10   03/12/24 Bike L 3 x 7 min Gait around widest part of back building in parking lot HS curls 25lb 2x15 Leg Ext 10lb 2x15 Shoulder ext 5lb standing on airex Toe raises on black bar 2x15 Slant board calf stretch Leg press 30lb 2x10   03/08/24 Bike L 3 x 6 min DGI   2 in box on 2 airex step ups forward & lateral x10 each Slant board calf stretch 20 lb resisted side steps over WaTE x5 each HS curls 25lb 2x12 Leg Ext 10lb 2x10 Side step in upside down 6in box from and on to airex x5 each Sit to stand on airex w/ ball toss 2x10  03/06/24 Bike L 3 x 6 min Gait around widest part of back building in parking lot  Two flight of stairs alt pattern with and without rail  Sit to stand LE on mat holding 3lb dumbbell  SLS 3x5'' each 2 in box on 2 airex step ups x10 each In resisted gait 30lb posterior pull alt box taps Resisted gait w/ 6in step ups 20lb x3 each    02/22/24 Bike L3 x 6 min Gait around widest part of back building in parking lot 6in box on airex forward and lateral  step ups x10  30lb resisted gait 4 way x 3 each  4 way ankle blue 2x10  SLS LLE 3x5''  02/20/24 Bike L3 x 6 min Gait to the back building 2 flights of stairs, alt pattern, with and without 1 rail Gait around two islands un the back parking lot on the L side Sit to stand on airex w/ yellow ball chest press 2x10  Side step in upside down 6in box x5 each  Upside down box on airex HS curls 20lb 2x12 Leg Ext 10lb 2x12  02/15/24  Scifit L4x8 minutes for w/u  Rocker board AP x20 standing Rocker board inversion/eversion x20 standing   PSFS, MMT, DGI and goal updates    Tandem stance blue foam pad 3x30 seconds B  Tandem walks solid surface x3 full laps // bars Step overs blue foam pad x10 B 3 way taps off blue foam pad 2x5 B        PATIENT EDUCATION:  Education details: exam findings, POC, HEP, encouraged WBAT, ice, elevation  Person educated:  Patient Education method: Explanation, Demonstration, and Handouts Education comprehension: verbalized understanding, returned demonstration, and needs further education  HOME EXERCISE PROGRAM:  Access Code: [email or web address] URL: [email or web address] Date: 01/02/2024 Prepared by: Josette Rough  Exercises - Supine Ankle Pumps in Elevation on Pillows  - 2-3 x daily - 7 x weekly - 1 sets - 10 reps - Ankle Circles in Elevation  - 2-3 x daily - 7 x weekly - 1 sets - 10 reps - Ankle Alphabet in Elevation  - 2-3 x daily - 7 x weekly - 1 sets - 10 reps - Lateral Weight Shift with Parallel Bars (BKA)  - 1 x daily - 7 x weekly - 1 sets - 10 reps - 2 seconds  hold - Supine Active Straight Leg Raise  - 1 x daily - 7 x weekly - 2 sets - 10 reps - Sidelying Hip Abduction  - 1 x daily - 7 x weekly - 2 sets - 10 reps - Seated Long Arc Quad  - 1 x daily - 7 x weekly - 2 sets - 10 reps  ASSESSMENT:  CLINICAL IMPRESSION:  Pt enters doing well with no new issues. She has progressed meeting all goals. She continues to have some pain in her arch. She is pleased with her current functional status and reports no limitations.     OBJECTIVE IMPAIRMENTS: Abnormal gait, decreased activity tolerance, decreased balance, decreased knowledge of use of DME, decreased mobility, difficulty walking, decreased ROM, decreased strength, and pain.   ACTIVITY LIMITATIONS: standing, stairs, transfers, and locomotion level  PARTICIPATION LIMITATIONS: meal prep, cleaning, laundry, driving, shopping, and community activity  PERSONAL FACTORS: Behavior pattern, Education, Fitness, Social background, and Time since onset of injury/illness/exacerbation are also affecting patient's functional outcome.   REHAB POTENTIAL: Good  CLINICAL DECISION MAKING: Stable/uncomplicated  EVALUATION COMPLEXITY: Low   GOALS: Goals reviewed with patient? No  SHORT TERM GOALS: Target date: 03/07/2024     Will be compliant with  appropriate progressive HEP  Baseline: Goal status: Met 01/10/24  2.  L ankle AROM to improve to: -10* DF, PF to 65*, eversion to 12* Baseline:  Goal status: ONGOING 01/02/24 see above, Progressing 01/10/24, Progressing 02/08/24, Partly Met 03/14/24  3.  Will be independent with appropriate edema management techniques  Baseline:  Goal status: Met 01/10/24  4.  Will be consistently ambulatory for household distances with LRAD  Baseline:  Goal status: Progressing 01/10/24, Partly met slight limp  02/22/24, Met 03/14/24    LONG TERM GOALS: 03/28/2024        MMT to be at least 4/5 L ankle  Baseline:  Goal status: ONGOING 02/15/24 see chart   2.  L ankle AROM to be as follows: DF 10* Baseline:  Goal status: Progressing 02/02/24, progressing 02/08/24, Met 02/22/24  3.  Will score at least 20/24 on DGI  Baseline:  Goal status: ONGOING 02/15/24 see chart, 03/08/24 Met 22/4  4.  Will be able to ambulate community distances with LRAD, L ankle pain no more than 3/10 Baseline:  Goal status: ONGOING 02/15/24 hasn't tried community ambulation (grocery store only now) without cart  03/14/24 Met  5.  PSFS to have improved by 3 points  Baseline:  Goal status: MET 02/15/24     PLAN:  PT FREQUENCY: 2x/week  PT DURATION: 6 weeks  PLANNED INTERVENTIONS: 97750- Physical Performance Testing, 97110-Therapeutic exercises, 97530- Therapeutic activity, 97112- Neuromuscular re-education, 97535- Self Care, 02859- Manual therapy, 97116- Gait training, 97016- Vasopneumatic device, and Cryotherapy  PLAN FOR NEXT SESSION: D/C  PHYSICAL THERAPY DISCHARGE SUMMARY  Visits from Start of Care: 19  Patient agrees to discharge. Patient goals were met. Patient is being discharged due to meeting the stated rehab goals.  Tanda Sorrow, PTA 03/14/24 11:00 AM

## 2024-03-22 DIAGNOSIS — Z23 Encounter for immunization: Secondary | ICD-10-CM | POA: Diagnosis not present

## 2024-03-29 ENCOUNTER — Other Ambulatory Visit: Payer: Self-pay | Admitting: Medical Genetics

## 2024-04-05 ENCOUNTER — Ambulatory Visit: Admitting: Family Medicine

## 2024-04-05 ENCOUNTER — Ambulatory Visit: Payer: Self-pay | Admitting: Family Medicine

## 2024-04-05 ENCOUNTER — Encounter: Payer: Self-pay | Admitting: Family Medicine

## 2024-04-05 VITALS — BP 118/70 | HR 74 | Temp 97.8°F | Ht 64.0 in | Wt 135.8 lb

## 2024-04-05 DIAGNOSIS — Z13820 Encounter for screening for osteoporosis: Secondary | ICD-10-CM | POA: Diagnosis not present

## 2024-04-05 DIAGNOSIS — Z780 Asymptomatic menopausal state: Secondary | ICD-10-CM | POA: Diagnosis not present

## 2024-04-05 DIAGNOSIS — I10 Essential (primary) hypertension: Secondary | ICD-10-CM

## 2024-04-05 DIAGNOSIS — E782 Mixed hyperlipidemia: Secondary | ICD-10-CM

## 2024-04-05 DIAGNOSIS — Z8781 Personal history of (healed) traumatic fracture: Secondary | ICD-10-CM

## 2024-04-05 DIAGNOSIS — E039 Hypothyroidism, unspecified: Secondary | ICD-10-CM | POA: Diagnosis not present

## 2024-04-05 DIAGNOSIS — Z9889 Other specified postprocedural states: Secondary | ICD-10-CM

## 2024-04-05 LAB — TSH: TSH: 0.84 u[IU]/mL (ref 0.35–5.50)

## 2024-04-05 LAB — LIPID PANEL
Cholesterol: 157 mg/dL (ref 0–200)
HDL: 60.4 mg/dL (ref >39.00)
LDL Cholesterol: 71 mg/dL (ref 0–99)
NonHDL: 96.32
Total CHOL/HDL Ratio: 3
Triglycerides: 125 mg/dL (ref 0.0–149.0)
VLDL: 25 mg/dL (ref 0.0–40.0)

## 2024-04-05 LAB — COMPREHENSIVE METABOLIC PANEL WITH GFR
ALT: 19 U/L (ref 0–35)
AST: 19 U/L (ref 0–37)
Albumin: 4.3 g/dL (ref 3.5–5.2)
Alkaline Phosphatase: 62 U/L (ref 39–117)
BUN: 15 mg/dL (ref 6–23)
CO2: 29 meq/L (ref 19–32)
Calcium: 9.7 mg/dL (ref 8.4–10.5)
Chloride: 104 meq/L (ref 96–112)
Creatinine, Ser: 0.73 mg/dL (ref 0.40–1.20)
GFR: 77.42 mL/min (ref >60.00)
Glucose, Bld: 110 mg/dL — ABNORMAL HIGH (ref 70–99)
Potassium: 4 meq/L (ref 3.5–5.1)
Sodium: 141 meq/L (ref 135–145)
Total Bilirubin: 0.7 mg/dL (ref 0.2–1.2)
Total Protein: 6.9 g/dL (ref 6.0–8.3)

## 2024-04-05 NOTE — Progress Notes (Signed)
 [facility] [address] [place] [address] Dept: [phone] Dept Fax: [phone]  Chronic Care Office Visit  Subjective:    Patient ID: Shelly Pearson, female    DOB: 1941-04-01, 83 y.o..   MRN: [id number]  Chief Complaint  Patient presents with   Hypertension    6 month f/u HTN.  No concerns.    History of Present Illness:  Patient is in today for reassessment of chronic medical issues.  Ms. Plunk has essential hypertension. She is managed on losartan  25 mg daily.    Ms. Olden has a history of hyperlipidemia. She is managed on atorvastatin  20 mg daily.   Ms. Null has a history of hypothyroidism. She is managed on levothyroxine  50 mcg daily, except 25 mcg (1/2 tab) 2 days a week.   Since her last visit, Ms. Samet suffered a bimalleolar fracture of her left ankle. She underwent ORIF. Later,s he had the plate and screws removed form the medial distal tibia. She has completed PT. She has had some residual issues with the ankle and notes she is not able to be as active as she was before.  Past Medical History: Patient Active Problem List   Diagnosis Date Noted   Pain from implanted hardware 12/19/2023   Status post open reduction with internal fixation (ORIF) of fracture of ankle- Left 10/06/2023   Arthritis of midtarsal joint of left foot 10/06/2023   Exudative age-related macular degeneration, right eye, with active choroidal neovascularization (HCC) 05/24/2022   Decreased grip strength of right hand 09/16/2021   Gait disturbance 07/12/2021   Mild cognitive impairment 07/12/2021   Thyroid  goiter 12/10/2020   Mixed hyperlipidemia 12/10/2020   Degenerative cervical spinal stenosis, mild C4-C5 and C5-C6 12/10/2020   Lumbar degenerative disc disease, L4-L5 with left lateral recess stenosis 12/10/2020   Personal history of fall 09/01/2020   Congenital cavus deformity of both feet 03/02/2020    Metatarsalgia of both feet 03/02/2020   Hand arthritis 03/02/2020   Arthritis of carpometacarpal (CMC) joint of both thumbs 09/19/2019   Overactive bladder 09/19/2019   Sciatica 04/26/2019   Paresthesia of both feet 02/04/2019   Epidermal cyst 04/17/2018   Allergic rhinitis 06/23/2015   Eczema 06/23/2015   Gastro-esophageal reflux disease without esophagitis 06/23/2015   Essential hypertension 06/23/2015   Hypothyroidism 06/23/2015   Past Surgical History:  Procedure Laterality Date   ABDOMINAL HYSTERECTOMY     APPENDECTOMY  12/18/71   When daughter was born by c-section   CESAREAN SECTION     CYSTOCELE REPAIR     FRACTURE SURGERY     HARDWARE REMOVAL Left 12/19/2023   Procedure: REMOVAL, HARDWARE;  Surgeon: Genelle Standing, MD;  Location: [facility];  Service: Orthopedics;  Laterality: Left;  LEFT  ANKLE REMOVAL OF HARDWARE   ORIF ANKLE FRACTURE Left 06/08/2023   Procedure: LEFT OPEN REDUCTION INTERNAL FIXATION (ORIF) ANKLE FRACTURE;  Surgeon: Genelle Standing, MD;  Location: [facility];  Service: Orthopedics;  Laterality: Left;   ROTATOR CUFF REPAIR Right    2018   Family History  Problem Relation Age of Onset   Stroke Mother    Cancer Father    Alcohol abuse Father    Cancer Sister    Cancer Brother    Arthritis Maternal Grandmother    Arthritis Paternal Grandmother    Cancer Paternal Grandmother    Cancer Brother    Outpatient Medications Prior to Visit  Medication Sig Dispense Refill   atorvastatin  (  LIPITOR) 20 MG tablet TAKE 1 TABLET DAILY 90 tablet 3   Biotin 10 MG CAPS Take 10 mg by mouth daily.     levothyroxine  (SYNTHROID ) 50 MCG tablet TAKE 1 TABLET DAILY BEFORE BREAKFAST 90 tablet 3   losartan  (COZAAR ) 25 MG tablet TAKE 1 TABLET DAILY 90 tablet 3   Multiple Vitamins-Minerals (MATURE ADULT CENTURY PO) Take 1 tablet by mouth daily.     Polyethyl Glycol-Propyl Glycol (SYSTANE OP) Place 2 drops into both eyes 3 (three) times daily.     trospium   (SANCTURA ) 20 MG tablet TAKE 1 TABLET AT BEDTIME 90 tablet 3   No facility-administered medications prior to visit.   Allergies  Allergen Reactions   Adhesive  [Tape] Rash   Objective:   Today's Vitals   04/05/24 0833  BP: 118/70  Pulse: 74  Temp: 97.8 F (36.6 C)  TempSrc: Temporal  SpO2: 95%  Weight: 135 lb 12.8 oz (61.6 kg)  Height: 5' 4 (1.626 m)   Body mass index is 23.31 kg/m.   General: Well developed, well nourished. No acute distress. Extremities: Full ROM of right ankle with good stability.  No joint swelling or tenderness. No edema noted. Psych: Alert and oriented. Normal mood and affect.  Health Maintenance Due  Topic Date Due   DTaP/Tdap/Td (3 - Td or Tdap) 11/09/2023     Assessment & Plan:   Problem List Items Addressed This Visit       Cardiovascular and Mediastinum   Essential hypertension - Primary   Blood pressure is in good control. Continue losartan  25 mg daily. I will check annual renal labs today.      Relevant Orders   Comprehensive metabolic panel with GFR     Endocrine   Hypothyroidism   I will check a TSH today. Continue levothyroxine  50 mcg daily, except 25 mcg 2 days a week.      Relevant Orders   TSH     Other   Mixed hyperlipidemia   I will check annual lipids today. Continue atorvastatin  20 mg daily.      Relevant Orders   Lipid panel   Comprehensive metabolic panel with GFR   Status post open reduction with internal fixation (ORIF) of fracture of ankle- Left   Appears resolved at this point, but has some lingering joint issues that may resolve with time.      Other Visit Diagnoses       Screening for osteoporosis       History of osteopenia. I will reassess her bone density.   Relevant Orders   DG Bone Density     Postmenopausal       Relevant Orders   DG Bone Density       Return in about 6 months (around 10/04/2024) for Reassessment.   Garnette CHRISTELLA Simpler, MD

## 2024-04-05 NOTE — Assessment & Plan Note (Signed)
I will check annual lipids today. Continue atorvastatin 20 mg daily.

## 2024-04-05 NOTE — Assessment & Plan Note (Signed)
 Appears resolved at this point, but has some lingering joint issues that may resolve with time.

## 2024-04-05 NOTE — Assessment & Plan Note (Signed)
 I will check a TSH today. Continue levothyroxine  50 mcg daily, except 25 mcg 2 days a week.

## 2024-04-05 NOTE — Assessment & Plan Note (Signed)
 Blood pressure is in good control. Continue losartan  25 mg daily. I will check annual renal labs today.

## 2024-04-18 ENCOUNTER — Other Ambulatory Visit

## 2024-04-18 DIAGNOSIS — Z006 Encounter for examination for normal comparison and control in clinical research program: Secondary | ICD-10-CM

## 2024-04-29 LAB — GENECONNECT MOLECULAR SCREEN: Genetic Analysis Overall Interpretation: NEGATIVE

## 2024-05-06 ENCOUNTER — Other Ambulatory Visit: Payer: Self-pay | Admitting: Nurse Practitioner

## 2024-05-06 ENCOUNTER — Encounter: Payer: Self-pay | Admitting: Radiology

## 2024-05-06 DIAGNOSIS — H353211 Exudative age-related macular degeneration, right eye, with active choroidal neovascularization: Secondary | ICD-10-CM | POA: Diagnosis not present

## 2024-05-06 DIAGNOSIS — K644 Residual hemorrhoidal skin tags: Secondary | ICD-10-CM

## 2024-05-28 DIAGNOSIS — R200 Anesthesia of skin: Secondary | ICD-10-CM | POA: Diagnosis not present

## 2024-05-28 DIAGNOSIS — L909 Atrophic disorder of skin, unspecified: Secondary | ICD-10-CM | POA: Diagnosis not present

## 2024-05-28 DIAGNOSIS — L851 Acquired keratosis [keratoderma] palmaris et plantaris: Secondary | ICD-10-CM | POA: Insufficient documentation

## 2024-05-28 DIAGNOSIS — R202 Paresthesia of skin: Secondary | ICD-10-CM | POA: Diagnosis not present

## 2024-05-28 DIAGNOSIS — M79671 Pain in right foot: Secondary | ICD-10-CM | POA: Diagnosis not present

## 2024-05-28 DIAGNOSIS — M79672 Pain in left foot: Secondary | ICD-10-CM | POA: Diagnosis not present

## 2024-06-24 ENCOUNTER — Telehealth: Payer: Self-pay | Admitting: Family Medicine

## 2024-06-24 NOTE — Telephone Encounter (Signed)
 Pt has r/s with wait list

## 2024-07-10 ENCOUNTER — Ambulatory Visit: Payer: Self-pay

## 2024-07-10 NOTE — Telephone Encounter (Signed)
 FYI Only or Action Required?: FYI only for provider: appointment scheduled on 1/8.  Patient was last seen in primary care on 04/05/2024 by Thedora Garnette HERO, MD.  Called Nurse Triage reporting Fall.  Symptoms began yesterday.  Interventions attempted: OTC medications: Acetaminophen  and Rest, hydration, or home remedies.  Symptoms are: stable.  Triage Disposition: See Physician Within 24 Hours  Patient/caregiver understands and will follow disposition?: Yes   Copied from CRM #[id number]. Topic: Clinical - Red Word Triage >> Jul 10, 2024  2:25 PM Taleah C wrote: Red Word that prompted transfer to Nurse Triage: had a fall yesterday and now having pain in hips, hard to move.    Reason for Disposition  [1] MODERATE pain (e.g., interferes with normal activities) AND [2] high-risk adult (e.g., age > 60 years, osteoporosis, chronic steroid use)  Answer Assessment - Initial Assessment Questions 1. MECHANISM: How did the fall happen?     Not sure why, just fell.  Was narrow area in kitchen, landed on side/back, happened fast.  Not sure what final position was. Not sure if hit head but probably did, the only thing that is hurting is that back area (no head, neck or face pain). No loss of consciousness.  At some point it felt dizzy during fall, not necessarily prior. Denies any vision changes. Denies any numbness or tingling anywhere.   2. DOMESTIC VIOLENCE AND ELDER ABUSE SCREENING: Did you fall because someone pushed you or tried to hurt you? If Yes, ask: Are you safe now?     Husband present upstairs, retired; denies any safety/abuse concerns.   3. ONSET: When did the fall happen? (e.g., minutes, hours, or days ago)     Around 7:30 AM yesterday  4. LOCATION: What part of the body hit the ground? (e.g., back, buttocks, head, hips, knees, hands, head, stomach)     Right lower back is painful just under ribs and above waist.   5. INJURY: Did you hurt (injure) yourself when you fell?  If Yes, ask: What did you injure? Tell me more about this? (e.g., body area; type of injury; pain severity)     See #4  6. PAIN: Is there any pain? If Yes, ask: How bad is the pain? (e.g., Scale 0-10; or none, mild,      When moving, 7/10.  Some movements can't make.  Denies trouble breathing even when taking deep breaths and no popping sensation heard or felt in rib cage when deep breathing.   7. SIZE: For cuts, bruises, or swelling, ask: How large is it? (e.g., inches or centimeters)      No bruising, swelling, or breaks in skin.    9. OTHER SYMPTOMS: Do you have any other symptoms? (e.g., dizziness, fever, weakness; new-onset or worsening).      Denies any weakness or recent fever/chills. No recent dizziness or prior to fall.   10. CAUSE: What do you think caused the fall (or falling)? (e.g., dizzy spell, tripped)       Unsure  Recently on 3 week trip, ate a lot of Christmas food, puffier than usual. 3 day drive home. Clothes are fitting normally. Denies any chest pain or heart palpiations.  HR and BP checked and normal this AM.  Tylenol  has been helpful and siting upright with legs elevated.  Denies any significant edema anywhere. Reports history of neuropathy and cautious with activity as doesn't feel everything that well.  Avoids NSAIDs as they irritate the stomach. Denies history of liver disease.  Answer Assessment - Initial Assessment Questions See alternative assessment.  Protocols used: Falls and Falling-A-AH, Chest Injury-A-AH

## 2024-07-10 NOTE — Telephone Encounter (Signed)
 Noted. Nurse triage scheduled patient an appointment for 07/11/24 with Rosina Senters, NP.

## 2024-07-11 ENCOUNTER — Encounter: Payer: Self-pay | Admitting: Internal Medicine

## 2024-07-11 ENCOUNTER — Ambulatory Visit: Payer: Self-pay | Admitting: Internal Medicine

## 2024-07-11 ENCOUNTER — Ambulatory Visit: Admitting: Internal Medicine

## 2024-07-11 VITALS — BP 143/98 | HR 83 | Temp 97.5°F | Ht 64.0 in | Wt 142.0 lb

## 2024-07-11 DIAGNOSIS — M25551 Pain in right hip: Secondary | ICD-10-CM

## 2024-07-11 DIAGNOSIS — R34 Anuria and oliguria: Secondary | ICD-10-CM | POA: Diagnosis not present

## 2024-07-11 LAB — POC URINALSYSI DIPSTICK (AUTOMATED)
Bilirubin, UA: NEGATIVE
Blood, UA: NEGATIVE
Glucose, UA: POSITIVE — AB
Ketones, UA: NEGATIVE
Leukocytes, UA: NEGATIVE
Nitrite, UA: NEGATIVE
Protein, UA: NEGATIVE
Spec Grav, UA: 1.01
Urobilinogen, UA: 0.2 U/dL
pH, UA: 5.5

## 2024-07-11 LAB — BASIC METABOLIC PANEL WITH GFR
BUN: 16 mg/dL (ref 6–23)
CO2: 32 meq/L (ref 19–32)
Calcium: 9.6 mg/dL (ref 8.4–10.5)
Chloride: 105 meq/L (ref 96–112)
Creatinine, Ser: 0.68 mg/dL (ref 0.40–1.20)
GFR: 81.83 mL/min
Glucose, Bld: 92 mg/dL (ref 70–99)
Potassium: 3.7 meq/L (ref 3.5–5.1)
Sodium: 142 meq/L (ref 135–145)

## 2024-07-11 NOTE — Progress Notes (Signed)
 " [facility] [address] [place] [address] Dept: [phone] Dept Fax: [phone]  Acute Care Office Visit  Subjective:   Shelly Pearson Jun 30, 1941 07/11/2024  Chief Complaint  Patient presents with   Fall    12/7 right, since the fall urinating not  a lot    HPI:  Discussed the use of AI scribe software for clinical note transcription with the patient, who gave verbal consent to proceed.  History of Present Illness   Shelly Pearson is an 84 year old female who presents with a fall and right-sided hip pain.  She experienced a fall yesterday morning while getting up to go to the kitchen. The exact mechanism of the fall is unclear, but she suspects she may have slipped, landing on her right side. She does not recall passing out, although she acknowledges the possibility of having bumped it slightly due to the narrow space where she fell.  She describes the pain as being located on the right side, particularly in the hip area, and notes that it worsens with movement. She has been walking cautiously since the fall. She mentions a sensation of puffiness in the area of pain. She has been taking Tylenol  for pain relief, which helps but does not completely alleviate the pain.  Regarding her urinary symptoms, she has noticed a decrease in frequency and volume of urination since the fall, despite taking trospium  chloride for urgency incontinence for years. No burning, foul odor, or blood in her urine.   No neck pain, back pain, chest pain, difficulty breathing, abdominal pain, headache or changes in vision.     The following portions of the patient's history were reviewed and updated as appropriate: past medical history, past surgical history, family history, social history, allergies, medications, and problem list.   Patient Active Problem List   Diagnosis Date Noted   Pain from implanted hardware 12/19/2023   Status  post open reduction with internal fixation (ORIF) of fracture of ankle- Left 10/06/2023   Arthritis of midtarsal joint of left foot 10/06/2023   Exudative age-related macular degeneration, right eye, with active choroidal neovascularization (HCC) 05/24/2022   Decreased grip strength of right hand 09/16/2021   Gait disturbance 07/12/2021   Mild cognitive impairment 07/12/2021   Thyroid  goiter 12/10/2020   Mixed hyperlipidemia 12/10/2020   Degenerative cervical spinal stenosis, mild C4-C5 and C5-C6 12/10/2020   Lumbar degenerative disc disease, L4-L5 with left lateral recess stenosis 12/10/2020   Personal history of fall 09/01/2020   Congenital cavus deformity of both feet 03/02/2020   Metatarsalgia of both feet 03/02/2020   Hand arthritis 03/02/2020   Arthritis of carpometacarpal (CMC) joint of both thumbs 09/19/2019   Overactive bladder 09/19/2019   Sciatica 04/26/2019   Paresthesia of both feet 02/04/2019   Epidermal cyst 04/17/2018   Allergic rhinitis 06/23/2015   Eczema 06/23/2015   Gastro-esophageal reflux disease without esophagitis 06/23/2015   Essential hypertension 06/23/2015   Hypothyroidism 06/23/2015   Past Medical History:  Diagnosis Date   Allergy 60 or so years ago   Pollen, bandaids, pollen   Arthritis 20 or more years ago   Thumbs, feet, shoulder, back, etc.   B12 deficiency 03/02/2020   GERD (gastroesophageal reflux disease)    Hyperlipidemia Been on atorvastatin  for ? years   Hypertension    Seasonal allergies    Thyroid  disease    Urge incontinence of urine 12/10/2020   Urgency incontinence    Past Surgical History:  Procedure Laterality Date   ABDOMINAL HYSTERECTOMY     APPENDECTOMY  12/18/71   When daughter was born by c-section   CESAREAN SECTION     CYSTOCELE REPAIR     FRACTURE SURGERY     HARDWARE REMOVAL Left 12/19/2023   Procedure: REMOVAL, HARDWARE;  Surgeon: Genelle Standing, MD;  Location: [facility];  Service: Orthopedics;   Laterality: Left;  LEFT  ANKLE REMOVAL OF HARDWARE   ORIF ANKLE FRACTURE Left 06/08/2023   Procedure: LEFT OPEN REDUCTION INTERNAL FIXATION (ORIF) ANKLE FRACTURE;  Surgeon: Genelle Standing, MD;  Location: [facility];  Service: Orthopedics;  Laterality: Left;   ROTATOR CUFF REPAIR Right    2018   Family History  Problem Relation Age of Onset   Stroke Mother    Cancer Father    Alcohol abuse Father    Cancer Sister    Cancer Brother    Arthritis Maternal Grandmother    Arthritis Paternal Grandmother    Cancer Paternal Grandmother    Cancer Brother    Current Medications[1] Allergies[2]   ROS: A complete ROS was performed with pertinent positives/negatives noted in the HPI. The remainder of the ROS are negative.    Objective:   Today's Vitals   07/11/24 1116  BP: (!) 143/98  Pulse: 83  Temp: (!) 97.5 F (36.4 C)  SpO2: 99%  Weight: 142 lb (64.4 kg)  Height: 5' 4 (1.626 m)    GENERAL: Well-appearing, in NAD. Well nourished.  SKIN: Pink, warm and dry. No rash, lesion, ulceration, or ecchymoses.  NECK: Trachea midline. Full ROM w/o pain or tenderness. No lymphadenopathy.  RESPIRATORY: Chest wall symmetrical. Respirations even and non-labored. Breath sounds clear to auscultation bilaterally.  CARDIAC: S1, S2 present, regular rate and rhythm. Peripheral pulses 2+ bilaterally.  MSK: Muscle tone and strength appropriate for age. TTP to R. Hip / flank region. No internal/external rotation or shortening to RLE. Pain with right hip ROM.  EXTREMITIES: Without clubbing, cyanosis, or edema.  NEUROLOGIC: No sensory deficits. PSYCH/MENTAL STATUS: Alert, oriented x 3. Cooperative, appropriate mood and affect.    Results for orders placed or performed in visit on 07/11/24  POCT Urinalysis Dipstick (Automated)  Result Value Ref Range   Color, UA     Clarity, UA     Glucose, UA Positive (A) Negative   Bilirubin, UA negative    Ketones, UA negative    Spec Grav, UA 1.010 1.010 - 1.025    Blood, UA negative    pH, UA 5.5 5.0 - 8.0   Protein, UA Negative Negative   Urobilinogen, UA 0.2 0.2 or 1.0 E.U./dL   Nitrite, UA negative    Leukocytes, UA Negative Negative      Assessment & Plan:  Assessment and Plan    Right hip pain after fall Acute right hip pain post-fall with differential diagnosis of muscle strain or possible fracture. Pain localized, non-radiating, and partially relieved by Tylenol .  - Ordered x-ray of the right hip to rule out fracture. - Advised rest, use of Tylenol , heating pad, and ice packs for pain management.  Decreased urination - Check BMP  - Urinalysis clear.      Orders Placed This Encounter  Procedures   DG HIP UNILAT W OR W/O PELVIS 2-3 VIEWS RIGHT    Standing Status:   Future    Expiration Date:   07/11/2025    Reason for Exam (SYMPTOM  OR DIAGNOSIS REQUIRED):   right hip pain due to fall  Preferred imaging location?:   Internal    Radiology Contrast Protocol - do NOT remove file path:   [email or web address]    Release to patient:   Immediate   Basic Metabolic Panel (BMET)   POCT Urinalysis Dipstick (Automated)   Lab Orders         Basic Metabolic Panel (BMET)         POCT Urinalysis Dipstick (Automated)     No images are attached to the encounter or orders placed in the encounter.  Return if symptoms worsen or fail to improve.   Rosina Senters, FNP     [1]  Current Outpatient Medications:    atorvastatin  (LIPITOR) 20 MG tablet, TAKE 1 TABLET DAILY, Disp: 90 tablet, Rfl: 3   Biotin 10 MG CAPS, Take 10 mg by mouth daily., Disp: , Rfl:    levothyroxine  (SYNTHROID ) 50 MCG tablet, TAKE 1 TABLET DAILY BEFORE BREAKFAST, Disp: 90 tablet, Rfl: 3   losartan  (COZAAR ) 25 MG tablet, TAKE 1 TABLET DAILY, Disp: 90 tablet, Rfl: 3   Multiple Vitamins-Minerals (MATURE ADULT CENTURY PO), Take 1 tablet by mouth daily., Disp: , Rfl:    Polyethyl Glycol-Propyl Glycol (SYSTANE OP), Place 2 drops into both  eyes 3 (three) times daily., Disp: , Rfl:    trospium  (SANCTURA ) 20 MG tablet, TAKE 1 TABLET AT BEDTIME, Disp: 90 tablet, Rfl: 3 [2]  Allergies Allergen Reactions   Adhesive  [Tape] Rash   "

## 2024-07-11 NOTE — Patient Instructions (Addendum)
 Tylenol  as needed for pain  Rest  Alternate ice and heat packs  Return for xray tomorrow.

## 2024-07-12 ENCOUNTER — Other Ambulatory Visit

## 2024-07-12 ENCOUNTER — Ambulatory Visit (INDEPENDENT_AMBULATORY_CARE_PROVIDER_SITE_OTHER)

## 2024-07-12 DIAGNOSIS — M25551 Pain in right hip: Secondary | ICD-10-CM

## 2024-07-19 ENCOUNTER — Encounter: Payer: Self-pay | Admitting: Family Medicine

## 2024-07-19 ENCOUNTER — Ambulatory Visit (INDEPENDENT_AMBULATORY_CARE_PROVIDER_SITE_OTHER): Admitting: Family Medicine

## 2024-07-19 VITALS — BP 122/68 | HR 84 | Temp 98.2°F | Ht 64.0 in | Wt 143.0 lb

## 2024-07-19 DIAGNOSIS — S7001XD Contusion of right hip, subsequent encounter: Secondary | ICD-10-CM | POA: Diagnosis not present

## 2024-07-19 NOTE — Progress Notes (Signed)
 " [facility] [address] [place] [address] Dept: [phone] Dept Fax: [phone]  Office Visit  Subjective:    Patient ID: Shelly Pearson, female    DOB: 1941-01-07, 84 y.o..   MRN: [id number]  Chief Complaint  Patient presents with   Follow-up    C/o still having pain in her lower back/hip when she fell 1 week ago.  Taking Tylenol .    History of Present Illness:  Patient is in today for reassessment to injuries related to a fall at home. Last week, Ms. Delancey fell while walking into her kitchen. It is unclear what exactly caused her fall, possibly slipping, but she landed on her right side. She does not remember losing consciousness. Since her fall, she has right hip/flank pain that is worse with movement. She has been using Tylenol  infrequently.   Past Medical History: Patient Active Problem List   Diagnosis Date Noted   Pain from implanted hardware 12/19/2023   Status post open reduction with internal fixation (ORIF) of fracture of ankle- Left 10/06/2023   Arthritis of midtarsal joint of left foot 10/06/2023   Exudative age-related macular degeneration, right eye, with active choroidal neovascularization (HCC) 05/24/2022   Decreased grip strength of right hand 09/16/2021   Gait disturbance 07/12/2021   Mild cognitive impairment 07/12/2021   Thyroid  goiter 12/10/2020   Mixed hyperlipidemia 12/10/2020   Degenerative cervical spinal stenosis, mild C4-C5 and C5-C6 12/10/2020   Lumbar degenerative disc disease, L4-L5 with left lateral recess stenosis 12/10/2020   Personal history of fall 09/01/2020   Congenital cavus deformity of both feet 03/02/2020   Metatarsalgia of both feet 03/02/2020   Hand arthritis 03/02/2020   Arthritis of carpometacarpal (CMC) joint of both thumbs 09/19/2019   Overactive bladder 09/19/2019   Sciatica 04/26/2019   Paresthesia of both feet 02/04/2019   Epidermal cyst 04/17/2018    Allergic rhinitis 06/23/2015   Eczema 06/23/2015   Gastro-esophageal reflux disease without esophagitis 06/23/2015   Essential hypertension 06/23/2015   Hypothyroidism 06/23/2015   Past Surgical History:  Procedure Laterality Date   ABDOMINAL HYSTERECTOMY     APPENDECTOMY  12/18/71   When daughter was born by c-section   CESAREAN SECTION     CYSTOCELE REPAIR     FRACTURE SURGERY     HARDWARE REMOVAL Left 12/19/2023   Procedure: REMOVAL, HARDWARE;  Surgeon: Genelle Standing, MD;  Location: [facility];  Service: Orthopedics;  Laterality: Left;  LEFT  ANKLE REMOVAL OF HARDWARE   ORIF ANKLE FRACTURE Left 06/08/2023   Procedure: LEFT OPEN REDUCTION INTERNAL FIXATION (ORIF) ANKLE FRACTURE;  Surgeon: Genelle Standing, MD;  Location: [facility];  Service: Orthopedics;  Laterality: Left;   ROTATOR CUFF REPAIR Right    2018   Family History  Problem Relation Age of Onset   Stroke Mother    Cancer Father    Alcohol abuse Father    Cancer Sister    Cancer Brother    Arthritis Maternal Grandmother    Arthritis Paternal Grandmother    Cancer Paternal Grandmother    Cancer Brother    Outpatient Medications Prior to Visit  Medication Sig Dispense Refill   atorvastatin  (LIPITOR) 20 MG tablet TAKE 1 TABLET DAILY 90 tablet 3   Biotin 10 MG CAPS Take 10 mg by mouth daily.     levothyroxine  (SYNTHROID ) 50 MCG tablet TAKE 1 TABLET DAILY BEFORE BREAKFAST 90 tablet 3   losartan  (COZAAR ) 25 MG tablet TAKE 1 TABLET  DAILY 90 tablet 3   Multiple Vitamins-Minerals (MATURE ADULT CENTURY PO) Take 1 tablet by mouth daily.     Polyethyl Glycol-Propyl Glycol (SYSTANE OP) Place 2 drops into both eyes 3 (three) times daily.     trospium  (SANCTURA ) 20 MG tablet TAKE 1 TABLET AT BEDTIME 90 tablet 3   No facility-administered medications prior to visit.   Allergies[1]   Objective:   Today's Vitals   07/19/24 1037  BP: 122/68  Pulse: 84  Temp: 98.2 F (36.8 C)  TempSrc: Temporal  SpO2: 97%   Weight: 143 lb (64.9 kg)  Height: 5' 4 (1.626 m)   Body mass index is 24.55 kg/m.   General: Well developed, well nourished. No acute distress. Back: Straight. No CVA tenderness bilaterally. Tenderness is noted over the right ischial brim. No bruising noted. Extremities: Full ROM. No tenderness over the lateral hip.  Psych: Alert and oriented. Normal mood and affect.  Health Maintenance Due  Topic Date Due   DTaP/Tdap/Td (3 - Td or Tdap) 11/09/2023   Imaging: Right Hip X-ray (07/12/2024) IMPRESSION: No acute findings.    Assessment & Plan:   Problem List Items Addressed This Visit   None Visit Diagnoses       Contusion of right hip, subsequent encounter    -  Primary   I revied the prior x-ray images and see no sign of pelvic fracture. Recommend heat and ongoign Tylenol  for pain. We will give this more time to resolve.       Return if symptoms worsen or fail to improve.    Garnette CHRISTELLA Simpler, MD  I,Emily Lagle,acting as a scribe for Garnette CHRISTELLA Simpler, MD.,have documented all relevant documentation on the behalf of Garnette CHRISTELLA Simpler, MD.  I, Garnette CHRISTELLA Simpler, MD, have reviewed all documentation for this visit. The documentation on 07/19/2024 for the exam, diagnosis, procedures, and orders are all accurate and complete.     [1]  Allergies Allergen Reactions   Adhesive  [Tape] Rash   "

## 2024-07-22 ENCOUNTER — Ambulatory Visit: Admitting: Family Medicine

## 2025-01-10 ENCOUNTER — Ambulatory Visit

## 2025-02-03 ENCOUNTER — Ambulatory Visit: Admitting: Family Medicine

## 2025-07-14 ENCOUNTER — Ambulatory Visit: Admitting: Family Medicine
